# Patient Record
Sex: Male | Born: 1935
Health system: Southern US, Community
[De-identification: ages and names within clinical notes are randomized; demographics above are authoritative.]

## PROBLEM LIST (undated history)

## (undated) DIAGNOSIS — I639 Cerebral infarction, unspecified: Secondary | ICD-10-CM

## (undated) DIAGNOSIS — E7800 Pure hypercholesterolemia, unspecified: Secondary | ICD-10-CM

## (undated) DIAGNOSIS — R05 Cough: Secondary | ICD-10-CM

## (undated) DIAGNOSIS — M545 Low back pain, unspecified: Secondary | ICD-10-CM

## (undated) DIAGNOSIS — M7020 Olecranon bursitis, unspecified elbow: Secondary | ICD-10-CM

## (undated) DIAGNOSIS — N179 Acute kidney failure, unspecified: Secondary | ICD-10-CM

## (undated) DIAGNOSIS — I726 Aneurysm of vertebral artery: Secondary | ICD-10-CM

## (undated) DIAGNOSIS — R059 Cough, unspecified: Secondary | ICD-10-CM

## (undated) DIAGNOSIS — I6381 Other cerebral infarction due to occlusion or stenosis of small artery: Secondary | ICD-10-CM

## (undated) DIAGNOSIS — I10 Essential (primary) hypertension: Secondary | ICD-10-CM

## (undated) DIAGNOSIS — I4891 Unspecified atrial fibrillation: Secondary | ICD-10-CM

## (undated) DIAGNOSIS — Z8619 Personal history of other infectious and parasitic diseases: Secondary | ICD-10-CM

## (undated) DIAGNOSIS — M75100 Unspecified rotator cuff tear or rupture of unspecified shoulder, not specified as traumatic: Secondary | ICD-10-CM

## (undated) DIAGNOSIS — I700 Atherosclerosis of aorta: Secondary | ICD-10-CM

## (undated) DIAGNOSIS — L570 Actinic keratosis: Secondary | ICD-10-CM

## (undated) HISTORY — DX: Atherosclerosis of aorta: I70.0

## (undated) HISTORY — DX: Pure hypercholesterolemia, unspecified: E78.00

## (undated) HISTORY — DX: Unspecified rotator cuff tear or rupture of unspecified shoulder, not specified as traumatic: M75.100

## (undated) HISTORY — PX: TONSILLECTOMY AND ADENOIDECTOMY: SUR1326

## (undated) HISTORY — DX: Low back pain: M54.5

## (undated) HISTORY — DX: Actinic keratosis: L57.0

## (undated) HISTORY — DX: Low back pain, unspecified: M54.50

## (undated) HISTORY — DX: Olecranon bursitis, unspecified elbow: M70.20

## (undated) HISTORY — DX: Other cerebral infarction due to occlusion or stenosis of small artery: I63.81

## (undated) HISTORY — DX: Personal history of other infectious and parasitic diseases: Z86.19

## (undated) HISTORY — DX: Essential (primary) hypertension: I10

## (undated) HISTORY — DX: Unspecified atrial fibrillation: I48.91

## (undated) HISTORY — DX: Cough, unspecified: R05.9

## (undated) HISTORY — DX: Cerebral infarction, unspecified: I63.9

## (undated) HISTORY — DX: Cough: R05

## (undated) HISTORY — DX: Aneurysm of vertebral artery: I72.6

---

## 2000-01-24 ENCOUNTER — Ambulatory Visit (HOSPITAL_BASED_OUTPATIENT_CLINIC_OR_DEPARTMENT_OTHER): Admission: RE | Admit: 2000-01-24 | Discharge: 2000-01-24 | Payer: Self-pay | Admitting: Orthopedic Surgery

## 2003-06-02 ENCOUNTER — Encounter: Admission: RE | Admit: 2003-06-02 | Discharge: 2003-06-02 | Payer: Self-pay | Admitting: Internal Medicine

## 2003-06-12 ENCOUNTER — Encounter: Admission: RE | Admit: 2003-06-12 | Discharge: 2003-06-12 | Payer: Self-pay | Admitting: Internal Medicine

## 2003-07-14 ENCOUNTER — Ambulatory Visit (HOSPITAL_COMMUNITY): Admission: RE | Admit: 2003-07-14 | Discharge: 2003-07-14 | Payer: Self-pay | Admitting: Gastroenterology

## 2007-03-13 ENCOUNTER — Encounter: Admission: RE | Admit: 2007-03-13 | Discharge: 2007-03-13 | Payer: Self-pay | Admitting: Internal Medicine

## 2007-03-16 ENCOUNTER — Encounter: Admission: RE | Admit: 2007-03-16 | Discharge: 2007-03-16 | Payer: Self-pay | Admitting: Internal Medicine

## 2007-05-18 ENCOUNTER — Encounter: Admission: RE | Admit: 2007-05-18 | Discharge: 2007-05-18 | Payer: Self-pay | Admitting: Neurological Surgery

## 2008-05-17 ENCOUNTER — Encounter: Admission: RE | Admit: 2008-05-17 | Discharge: 2008-05-17 | Payer: Self-pay | Admitting: Neurological Surgery

## 2009-01-05 ENCOUNTER — Ambulatory Visit (HOSPITAL_COMMUNITY): Admission: RE | Admit: 2009-01-05 | Discharge: 2009-01-05 | Payer: Self-pay | Admitting: Orthopedic Surgery

## 2010-05-30 ENCOUNTER — Encounter
Admission: RE | Admit: 2010-05-30 | Discharge: 2010-05-30 | Payer: Self-pay | Source: Home / Self Care | Attending: Neurological Surgery | Admitting: Neurological Surgery

## 2010-08-09 LAB — DIFFERENTIAL
Basophils Absolute: 0 10*3/uL (ref 0.0–0.1)
Basophils Relative: 0 % (ref 0–1)
Eosinophils Absolute: 0.2 10*3/uL (ref 0.0–0.7)
Eosinophils Relative: 3 % (ref 0–5)
Lymphocytes Relative: 33 % (ref 12–46)
Lymphs Abs: 2 10*3/uL (ref 0.7–4.0)
Monocytes Absolute: 1 10*3/uL (ref 0.1–1.0)
Monocytes Relative: 16 % — ABNORMAL HIGH (ref 3–12)
Neutro Abs: 3 10*3/uL (ref 1.7–7.7)
Neutrophils Relative %: 48 % (ref 43–77)

## 2010-08-09 LAB — CBC
HCT: 43.6 % (ref 39.0–52.0)
Hemoglobin: 14.9 g/dL (ref 13.0–17.0)
MCHC: 34.2 g/dL (ref 30.0–36.0)
MCV: 88.2 fL (ref 78.0–100.0)
Platelets: 155 10*3/uL (ref 150–400)
RBC: 4.95 MIL/uL (ref 4.22–5.81)
RDW: 14.1 % (ref 11.5–15.5)
WBC: 6.2 10*3/uL (ref 4.0–10.5)

## 2010-08-09 LAB — BASIC METABOLIC PANEL
BUN: 16 mg/dL (ref 6–23)
CO2: 26 mEq/L (ref 19–32)
Calcium: 9.6 mg/dL (ref 8.4–10.5)
Chloride: 104 mEq/L (ref 96–112)
Creatinine, Ser: 0.95 mg/dL (ref 0.4–1.5)
GFR calc Af Amer: >60 mL/min (ref >60)
GFR calc non Af Amer: >60 mL/min (ref >60)
Glucose, Bld: 116 mg/dL — ABNORMAL HIGH (ref 70–99)
Potassium: 4.5 mEq/L (ref 3.5–5.1)
Sodium: 138 mEq/L (ref 135–145)

## 2010-08-09 LAB — PROTIME-INR
INR: 1 (ref 0.00–1.49)
Prothrombin Time: 12.6 seconds (ref 11.6–15.2)

## 2010-08-09 LAB — URINALYSIS, ROUTINE W REFLEX MICROSCOPIC
Bilirubin Urine: NEGATIVE
Glucose, UA: NEGATIVE mg/dL
Hgb urine dipstick: NEGATIVE
Ketones, ur: NEGATIVE mg/dL
Nitrite: NEGATIVE
Protein, ur: NEGATIVE mg/dL
Specific Gravity, Urine: 1.012 (ref 1.005–1.030)
Urobilinogen, UA: 0.2 mg/dL (ref 0.0–1.0)
pH: 6.5 (ref 5.0–8.0)

## 2010-08-09 LAB — APTT: aPTT: 31 seconds (ref 24–37)

## 2010-09-20 NOTE — Op Note (Signed)
[facility]  Patient:    Steven Meadows, Steven Meadows                       MRN: [id number] Adm. Date:  [id number] Disc. Date: [id number] Attending:  Alinda Deem                           Operative Report  PREOPERATIVE DIAGNOSIS:  Left knee medial meniscal tear.  POSTOPERATIVE DIAGNOSIS:  Left knee medial meniscal tear along with medial femoral condyle chondromalacia grade 3, global.  OPERATION PERFORMED:  Left knee arthroscopic partial medial menisectomy and debridement of chondromalacia from the medial femoral condyle by grade 3 global.  SURGEON:  Alinda Deem, M.D.  ASSISTANT:  None.  ANESTHESIA:  General LMA.  ESTIMATED BLOOD LOSS:  Minimal.  FLUID REPLACEMENT:  1200 cc crystalloid.  DRAINS:  None.  TOURNIQUET TIME:  None.  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  Now plain radiographs are consistent with mild medial compartment DJD.  He is quite tender along the medial jointline and now desires arthroscopic evaluation and treatment of a medial meniscal tear.  DESCRIPTION OF PROCEDURE:  The patient was identified by arm band and taken to the operating room at [facility] where the appropriate anesthetic monitors were attached and general LMA anesthesia induced with the patient in the supine position.  A lateral post was then applied to the table and the left lower extremity prepped and draped in the usual sterile fashion from the ankle to the midthigh.  The inferomedial and inferolateral peripatellar portal regions were then infiltrated with  2 to 3 cc of 0.5% Marcaine and epinephrine solution and another 10 cc ws placed into the knee joint itself.  Using a #11 blade, standard inferomedial and inferolateral peripatellar portals were then made allowing introduction of the arthroscope  through the inferolateral portal and the outflow through the inferomedial portal.  Diagnostic arthroscopy was then undertaken.  The first finding was 2+ joint effusion with normal-appearing joint fluid and bits of articular cartilage noted to be floating around in the joint fluid as it was expressed out of the knee.  The suprapatellar pouch, patella and trochlea revealed grade 2 chondromalacia that required light  debridement.  Moving into the medial compartment, grade 3 chondromalacia of the medial femoral condyle, almost global was noted and this was debrided back to stable margins as well as continous outflow removing small bits of articular cartilage that were floating in the joint fluid. There is a complex degenerative tear of the posterior horn of the medial meniscus which was debrided with a 3.5 mm gator sucker shaver from [place] back to a stable margin, which was then thoroughly probed.  The ACL and the PCL were then probed and found to be intact.  The lateral compartment had minimal degenerative tearing of the lateral meniscus and this was very lightly debrided incidentally.  The medial and lateral gutters were cleared.  The arthroscope was taken medial and lateral to the PCL clearing the posterior compartments as well.  At this point the knee was washed out with normal saline solution, the arthroscopic instruments removed.  A dressing of Xeroform, 4 x 8 dressing sponges, Webril and an Ace wrap applied.  The patient was awakened and taken to the recovery room without  difficulty. DD:  01/24/00 TD:  01/26/00 Job: [id number]

## 2011-05-06 HISTORY — PX: OTHER SURGICAL HISTORY: SHX169

## 2011-06-16 ENCOUNTER — Encounter: Payer: Self-pay | Admitting: Emergency Medicine

## 2011-06-17 ENCOUNTER — Ambulatory Visit (INDEPENDENT_AMBULATORY_CARE_PROVIDER_SITE_OTHER): Payer: BC Managed Care – PPO | Admitting: Emergency Medicine

## 2011-06-17 ENCOUNTER — Encounter: Payer: Self-pay | Admitting: Emergency Medicine

## 2011-06-17 ENCOUNTER — Other Ambulatory Visit: Payer: Self-pay | Admitting: Emergency Medicine

## 2011-06-17 DIAGNOSIS — R05 Cough: Secondary | ICD-10-CM | POA: Insufficient documentation

## 2011-06-17 DIAGNOSIS — R059 Cough, unspecified: Secondary | ICD-10-CM

## 2011-06-17 DIAGNOSIS — E785 Hyperlipidemia, unspecified: Secondary | ICD-10-CM | POA: Insufficient documentation

## 2011-06-17 DIAGNOSIS — I6350 Cerebral infarction due to unspecified occlusion or stenosis of unspecified cerebral artery: Secondary | ICD-10-CM

## 2011-06-17 DIAGNOSIS — I10 Essential (primary) hypertension: Secondary | ICD-10-CM

## 2011-06-17 DIAGNOSIS — I639 Cerebral infarction, unspecified: Secondary | ICD-10-CM | POA: Insufficient documentation

## 2011-06-17 MED ORDER — LORATADINE 10 MG PO TABS: 10.0000 mg | ORAL_TABLET | Freq: Every day | ORAL | Status: DC

## 2011-06-17 MED ORDER — OMEPRAZOLE 20 MG PO CPDR: DELAYED_RELEASE_CAPSULE | ORAL | Status: DC

## 2011-06-17 NOTE — Patient Instructions (Signed)
Please stop your lisinopril altogether Restart valsartan 320mg  once daily Continue your nasal saline washes every day Restart loratadine (Claritin) 10mg  daily until your next visit Start omeprazole (Prilosec) 20mg  twice a day for the next 2 weeks, then go to to 20mg  daily until your next visit Follow with Dr Delton Coombes in 3 -4 weeks

## 2011-06-17 NOTE — Telephone Encounter (Signed)
Diovan is not on the pt's medication list and any request for this medication should go through pt's PCP, Dr. Theressa Millard.

## 2011-06-17 NOTE — Assessment & Plan Note (Signed)
Cough likely multifactorial, strongest factors being PND and nasal congestion Plan: Please stop your lisinopril altogether Restart valsartan 320mg  once daily Continue your nasal saline washes every day Restart loratadine (Claritin) 10mg  daily until your next visit Start omeprazole (Prilosec) 20mg  twice a day for the next 2 weeks, then go to to 20mg  daily until your next visit Follow with Dr Delton Coombes in 3 -4 weeks

## 2011-06-17 NOTE — Progress Notes (Signed)
Subjective:    Patient ID: Steven Meadows, male    DOB: 05/02/1932, 78 y.o.   MRN: [id number]  HPI 78 yo never smoker, presents in referral from Dr Particia Lather c/o cough. He was well until October 2012 when he got URI sx including cough. The URI got better but he maintained a dry cough, kept a tickle in his throat. He feels mucous drain down from head especially at night - will wake him from sleep. Also bothered by talking a lot. Treated twice with azithromycin. He was told to stop lisinopril for an ARB, but he actually only changed from bid to qd + ARB qd. Occasional ? GERD sx. Con    Review of Systems  Constitutional: Negative.  Negative for fever and unexpected weight change.  HENT: Positive for congestion. Negative for ear pain, nosebleeds, sore throat, rhinorrhea, sneezing, trouble swallowing, dental problem, postnasal drip and sinus pressure.   Eyes: Negative.  Negative for redness and itching.  Respiratory: Positive for cough. Negative for chest tightness, shortness of breath and wheezing.   Cardiovascular: Negative.  Negative for palpitations and leg swelling.  Gastrointestinal: Negative.  Negative for nausea and vomiting.  Genitourinary: Negative.  Negative for dysuria.  Musculoskeletal: Negative.  Negative for joint swelling.  Skin: Negative.  Negative for rash.  Neurological: Negative.  Negative for headaches.  Hematological: Negative.  Does not bruise/bleed easily.  Psychiatric/Behavioral: Negative.  Negative for dysphoric mood. The patient is not nervous/anxious.    Past Medical History  Diagnosis Date  . Cough   . Lacunar stroke   . HTN (hypertension)   . Hypercholesteremia   . Aneurysm of vertebral artery   . Low back pain   . Rotator cuff tear   . History of mononucleosis   . Abdominal aortic atherosclerosis   . Actinic keratoses   . Olecranon bursitis      Family History  Problem Relation Age of Onset  . Heart attack Paternal Grandfather   . Heart attack Maternal  Grandfather   . Cancer Paternal Grandmother      History   Social History  . Marital Status: Married    Spouse Name: N/A    Number of Children: N/A  . Years of Education: N/A   Occupational History  . Not on file.   Social History Main Topics  . Smoking status: Never Smoker   . Smokeless tobacco: Not on file  . Alcohol Use: Yes     2 drinks per night  . Drug Use: No  . Sexually Active: Not on file   Other Topics Concern  . Not on file   Social History Narrative  . No narrative on file     Allergies  Allergen Reactions  . Nabumetone     Gi upset     Outpatient Prescriptions Prior to Visit  Medication Sig Dispense Refill  . aspirin 325 MG EC tablet Take 325 mg by mouth 2 (two) times daily.      Marland Kitchen atorvastatin (LIPITOR) 40 MG tablet Take 40 mg by mouth daily.      . chlorthalidone (HYGROTON) 25 MG tablet Take 25 mg by mouth daily.      Marland Kitchen diltiazem (DILACOR XR) 240 MG 24 hr capsule Take 240 mg by mouth daily.      Marland Kitchen ezetimibe (ZETIA) 10 MG tablet Take 10 mg by mouth daily.      Marland Kitchen HYDROcodone-acetaminophen (VICODIN) 5-500 MG per tablet Take 1 tablet by mouth every 6 (six) hours as needed.      Marland Kitchen  lisinopril (PRINIVIL,ZESTRIL) 40 MG tablet Take 40 mg by mouth daily.      Marland Kitchen loratadine (CLARITIN) 10 MG tablet Take 10 mg by mouth daily.      . sildenafil (VIAGRA) 50 MG tablet Take 50 mg by mouth daily as needed.             Objective:   Physical Exam  Gen: Pleasant, well-nourished, in no distress,  normal affect  ENT: No lesions,  mouth clear,  oropharynx clear, no postnasal drip  Neck: No JVD, no TMG, no carotid bruits  Lungs: No use of accessory muscles, no dullness to percussion, clear without rales or rhonchi  Cardiovascular: RRR, heart sounds normal, no murmur or gallops, no peripheral edema  Musculoskeletal: No deformities, no cyanosis or clubbing  Neuro: alert, non focal  Skin: Warm, no lesions or rashes     Assessment & Plan:  Cough Cough likely  multifactorial, strongest factors being PND and nasal congestion Plan: Please stop your lisinopril altogether Restart valsartan 320mg  once daily Continue your nasal saline washes every day Restart loratadine (Claritin) 10mg  daily until your next visit Start omeprazole (Prilosec) 20mg  twice a day for the next 2 weeks, then go to to 20mg  daily until your next visit Follow with Dr Delton Coombes in 3 -4 weeks

## 2011-07-08 ENCOUNTER — Encounter: Payer: Self-pay | Admitting: Emergency Medicine

## 2011-07-08 ENCOUNTER — Ambulatory Visit (INDEPENDENT_AMBULATORY_CARE_PROVIDER_SITE_OTHER): Payer: BC Managed Care – PPO | Admitting: Emergency Medicine

## 2011-07-08 VITALS — BP 112/70 | HR 56 | Temp 97.7°F | Ht 72.0 in | Wt 219.8 lb

## 2011-07-08 DIAGNOSIS — R059 Cough, unspecified: Secondary | ICD-10-CM

## 2011-07-08 DIAGNOSIS — R05 Cough: Secondary | ICD-10-CM

## 2011-07-08 NOTE — Patient Instructions (Signed)
Please continue your loratadine and valsartan as you are taking them Use your nasal saline every day Stop the omeprazole. If you cough more off this medication we can consider restarting Try taking Qnasl 2 sprays each nostril daily to see if this helps your nose congestion.  Follow with Dr Particia Lather as planned and with Dr Delton Coombes if needed

## 2011-07-08 NOTE — Assessment & Plan Note (Signed)
Cough and PND much better although still present - continue NSW and lotatadine - add nasal steroid to see if he benefits; if so then will call in script - stop omeprazole to see if he tolerates - rov prn/

## 2011-07-08 NOTE — Progress Notes (Signed)
  Subjective:    Patient ID: Steven Meadows, male    DOB: Nov 28, 1931, 78 y.o.   MRN: [id number]  HPI 78 yo never smoker, presents in referral from Dr Particia Lather c/o cough. He was well until October 2012 when he got URI sx including cough. The URI got better but he maintained a dry cough, kept a tickle in his throat. He feels mucous drain down from head especially at night - will wake him from sleep. Also bothered by talking a lot. Treated twice with azithromycin. He was told to stop lisinopril for an ARB, but he actually only changed from bid to qd + ARB qd. Occasional ? GERD sx. Con  ROV 07/08/11 -- return visit for cough that started in 10/12. Last time we started rx for GERD, loratadine, changed ACE-i to ARB, valsartan 320mg . The cough is much better. Still with clear PND. Started [place]. Still on omeprazole qd.      Objective:   Physical Exam  Gen: Pleasant, well-nourished, in no distress,  normal affect  ENT: No lesions,  mouth clear,  oropharynx clear, no postnasal drip  Neck: No JVD, no TMG, no carotid bruits  Lungs: No use of accessory muscles, no dullness to percussion, clear without rales or rhonchi  Cardiovascular: RRR, heart sounds normal, no murmur or gallops, no peripheral edema  Musculoskeletal: No deformities, no cyanosis or clubbing  Neuro: alert, non focal  Skin: Warm, no lesions or rashes     Assessment & Plan:  Cough Cough and PND much better although still present - continue NSW and lotatadine - add nasal steroid to see if he benefits; if so then will call in script - stop omeprazole to see if he tolerates - rov prn/

## 2011-12-08 ENCOUNTER — Other Ambulatory Visit: Payer: Self-pay | Admitting: Emergency Medicine

## 2012-03-06 ENCOUNTER — Other Ambulatory Visit: Payer: Self-pay | Admitting: Emergency Medicine

## 2012-07-08 ENCOUNTER — Other Ambulatory Visit: Payer: Self-pay | Admitting: Emergency Medicine

## 2012-08-09 ENCOUNTER — Other Ambulatory Visit: Payer: Self-pay | Admitting: Emergency Medicine

## 2012-12-03 ENCOUNTER — Other Ambulatory Visit: Payer: Self-pay | Admitting: Emergency Medicine

## 2013-04-08 ENCOUNTER — Other Ambulatory Visit: Payer: Self-pay | Admitting: Emergency Medicine

## 2013-08-04 ENCOUNTER — Other Ambulatory Visit: Payer: Self-pay | Admitting: Emergency Medicine

## 2013-11-07 ENCOUNTER — Other Ambulatory Visit: Payer: Self-pay | Admitting: Neurological Surgery

## 2013-11-07 DIAGNOSIS — I671 Cerebral aneurysm, nonruptured: Secondary | ICD-10-CM

## 2013-11-09 ENCOUNTER — Other Ambulatory Visit: Payer: Self-pay | Admitting: Neurological Surgery

## 2013-11-09 LAB — BUN: BUN: 18 mg/dL (ref 6–23)

## 2013-11-09 LAB — CREATININE, SERUM: Creat: 1 mg/dL (ref 0.50–1.35)

## 2013-11-22 ENCOUNTER — Ambulatory Visit
Admission: RE | Admit: 2013-11-22 | Discharge: 2013-11-22 | Disposition: A | Discharge status: Home / Self Care | Payer: BC Managed Care – PPO | Source: Ambulatory Visit | Attending: Neurological Surgery | Admitting: Neurological Surgery

## 2013-11-22 DIAGNOSIS — I671 Cerebral aneurysm, nonruptured: Secondary | ICD-10-CM

## 2013-11-22 MED ORDER — IOHEXOL 350 MG/ML SOLN
75.0000 mL | Freq: Once | INTRAVENOUS | Status: AC | PRN
  Administered 2013-11-22: 75 mL via INTRAVENOUS

## 2014-01-10 ENCOUNTER — Emergency Department (HOSPITAL_COMMUNITY)
Admission: EM | Admit: 2014-01-10 | Discharge: 2014-01-10 | Disposition: A | Discharge status: Home / Self Care | Payer: BC Managed Care – PPO | Attending: Emergency Medicine | Admitting: Emergency Medicine

## 2014-01-10 ENCOUNTER — Encounter (HOSPITAL_COMMUNITY): Payer: Self-pay | Admitting: Emergency Medicine

## 2014-01-10 DIAGNOSIS — E7800 Pure hypercholesterolemia, unspecified: Secondary | ICD-10-CM | POA: Insufficient documentation

## 2014-01-10 DIAGNOSIS — IMO0002 Reserved for concepts with insufficient information to code with codable children: Secondary | ICD-10-CM | POA: Insufficient documentation

## 2014-01-10 DIAGNOSIS — Z8619 Personal history of other infectious and parasitic diseases: Secondary | ICD-10-CM | POA: Insufficient documentation

## 2014-01-10 DIAGNOSIS — Y9389 Activity, other specified: Secondary | ICD-10-CM | POA: Insufficient documentation

## 2014-01-10 DIAGNOSIS — I10 Essential (primary) hypertension: Secondary | ICD-10-CM | POA: Diagnosis not present

## 2014-01-10 DIAGNOSIS — Y9289 Other specified places as the place of occurrence of the external cause: Secondary | ICD-10-CM | POA: Diagnosis not present

## 2014-01-10 DIAGNOSIS — Z872 Personal history of diseases of the skin and subcutaneous tissue: Secondary | ICD-10-CM | POA: Diagnosis not present

## 2014-01-10 DIAGNOSIS — T63441A Toxic effect of venom of bees, accidental (unintentional), initial encounter: Secondary | ICD-10-CM

## 2014-01-10 DIAGNOSIS — Z8673 Personal history of transient ischemic attack (TIA), and cerebral infarction without residual deficits: Secondary | ICD-10-CM | POA: Diagnosis not present

## 2014-01-10 DIAGNOSIS — T63461A Toxic effect of venom of wasps, accidental (unintentional), initial encounter: Secondary | ICD-10-CM | POA: Insufficient documentation

## 2014-01-10 DIAGNOSIS — Z79899 Other long term (current) drug therapy: Secondary | ICD-10-CM | POA: Diagnosis not present

## 2014-01-10 DIAGNOSIS — Z87828 Personal history of other (healed) physical injury and trauma: Secondary | ICD-10-CM | POA: Insufficient documentation

## 2014-01-10 DIAGNOSIS — Z7982 Long term (current) use of aspirin: Secondary | ICD-10-CM | POA: Diagnosis not present

## 2014-01-10 DIAGNOSIS — T6391XA Toxic effect of contact with unspecified venomous animal, accidental (unintentional), initial encounter: Secondary | ICD-10-CM | POA: Diagnosis not present

## 2014-01-10 DIAGNOSIS — Z8739 Personal history of other diseases of the musculoskeletal system and connective tissue: Secondary | ICD-10-CM | POA: Insufficient documentation

## 2014-01-10 MED ORDER — OXYCODONE-ACETAMINOPHEN 5-325 MG PO TABS: 1.0000 | ORAL_TABLET | Freq: Four times a day (QID) | ORAL | Status: DC | PRN

## 2014-01-10 MED ORDER — DIPHENHYDRAMINE HCL 50 MG/ML IJ SOLN
25.0000 mg | Freq: Once | INTRAMUSCULAR | Status: AC
  Administered 2014-01-10: 25 mg via INTRAVENOUS
  Filled 2014-01-10: qty 1

## 2014-01-10 MED ORDER — ONDANSETRON HCL 4 MG/2ML IJ SOLN
4.0000 mg | Freq: Once | INTRAMUSCULAR | Status: AC
  Administered 2014-01-10: 4 mg via INTRAVENOUS
  Filled 2014-01-10: qty 2

## 2014-01-10 MED ORDER — SODIUM CHLORIDE 0.9 % IV SOLN
Freq: Once | INTRAVENOUS | Status: AC
  Administered 2014-01-10: 06:00:00 via INTRAVENOUS

## 2014-01-10 MED ORDER — DIPHENHYDRAMINE HCL 25 MG PO TABS: 50.0000 mg | ORAL_TABLET | Freq: Four times a day (QID) | ORAL | Status: DC

## 2014-01-10 MED ORDER — OXYCODONE-ACETAMINOPHEN 5-325 MG PO TABS
1.0000 | ORAL_TABLET | Freq: Once | ORAL | Status: AC
  Administered 2014-01-10: 1 via ORAL
  Filled 2014-01-10: qty 1

## 2014-01-10 MED ORDER — PREDNISONE 10 MG PO TABS: 60.0000 mg | ORAL_TABLET | Freq: Every day | ORAL | Status: DC

## 2014-01-10 MED ORDER — METHYLPREDNISOLONE SODIUM SUCC 125 MG IJ SOLR
125.0000 mg | Freq: Once | INTRAMUSCULAR | Status: AC
  Administered 2014-01-10: 125 mg via INTRAVENOUS
  Filled 2014-01-10: qty 2

## 2014-01-10 MED ORDER — FAMOTIDINE IN NACL 20-0.9 MG/50ML-% IV SOLN
20.0000 mg | Freq: Once | INTRAVENOUS | Status: AC
  Administered 2014-01-10: 20 mg via INTRAVENOUS
  Filled 2014-01-10: qty 50

## 2014-01-10 MED ORDER — MORPHINE SULFATE 4 MG/ML IJ SOLN
4.0000 mg | Freq: Once | INTRAMUSCULAR | Status: AC
  Administered 2014-01-10: 4 mg via INTRAVENOUS
  Filled 2014-01-10: qty 1

## 2014-01-10 NOTE — ED Notes (Addendum)
Pt resting with wife at bedside.

## 2014-01-10 NOTE — ED Notes (Signed)
Pt. reported that he was  stung by multiple " yellow jackets ' this evening at his scalp , right hand and both legs , airway intact / respirations unlaboerd.

## 2014-01-10 NOTE — Discharge Instructions (Signed)
Return to the ED with any concerns including difficulty breathing, lip or tongue swelling, fainting, vomiting, decreased level of alertness/lethargy, or any other alarming symptoms

## 2014-01-10 NOTE — ED Provider Notes (Signed)
CSN: [id number]     Arrival date & time 01/10/14  0125 History   First MD Initiated Contact with Patient 01/10/14 [phone]     Chief Complaint  Patient presents with  . Insect Bite     (Consider location/radiation/quality/duration/timing/severity/associated sxs/prior Treatment) HPI Pt presenting after approx 20-30 yellow jacket stings.  Pt states he was stung by multiple bees approx 6pm tonight.  Stings in his scalp, hands, lower legs.  No difficulty breathing.  No lip or tongue swelling.  No fainting. He took 2 doses of benadryl- 25mg  each.  Last dose approx 10pm tonight.  He also took a hydrocodone that he had at home for pain.  Pain is throbbing in nature and constant.  There are no other associated systemic symptoms, there are no other alleviating or modifying factors.   Past Medical History  Diagnosis Date  . Cough   . Lacunar stroke   . HTN (hypertension)   . Hypercholesteremia   . Aneurysm of vertebral artery   . Low back pain   . Rotator cuff tear   . History of mononucleosis   . Abdominal aortic atherosclerosis   . Actinic keratoses   . Olecranon bursitis    Past Surgical History  Procedure Laterality Date  . Tonsillectomy and adenoidectomy    . Cataract extraction--right eye  2013   Family History  Problem Relation Age of Onset  . Heart attack Paternal Grandfather   . Heart attack Maternal Grandfather   . Cancer Paternal Grandmother    History  Substance Use Topics  . Smoking status: Never Smoker   . Smokeless tobacco: Not on file  . Alcohol Use: Yes     Comment: 2 drinks per night    Review of Systems ROS reviewed and all otherwise negative except for mentioned in HPI    Allergies  Nabumetone  Home Medications   Prior to Admission medications   Medication Sig Start Date End Date Taking? Authorizing Provider  aspirin 325 MG EC tablet Take 325 mg by mouth 2 (two) times daily.   Yes Historical Provider, MD  atorvastatin (LIPITOR) 40 MG tablet Take 40 mg  by mouth daily.   Yes Historical Provider, MD  chlorthalidone (HYGROTON) 25 MG tablet Take 25 mg by mouth daily.   Yes Historical Provider, MD  diltiazem (DILACOR XR) 120 MG 24 hr capsule Take 120 mg by mouth daily.   Yes Historical Provider, MD  diphenhydrAMINE (BENADRYL) 25 mg capsule Take 25 mg by mouth every 6 (six) hours as needed for allergies.   Yes Historical Provider, MD  ezetimibe (ZETIA) 10 MG tablet Take 10 mg by mouth daily.   Yes Historical Provider, MD  loratadine (CLARITIN) 10 MG tablet Take 10 mg by mouth daily.   Yes Historical Provider, MD  naproxen sodium (ANAPROX) 220 MG tablet Take 440 mg by mouth daily as needed (pain).   Yes Historical Provider, MD  omeprazole (PRILOSEC) 20 MG capsule Take 20 mg by mouth daily. 06/17/11  Yes Collene Gobble, MD  valsartan (DIOVAN) 320 MG tablet Take 320 mg by mouth daily.   Yes Historical Provider, MD  diphenhydrAMINE (BENADRYL) 25 MG tablet Take 2 tablets (50 mg total) by mouth every 6 (six) hours. Take 1-2 tablets every 6 hours x 2 days, then space out to an as needed basis 01/10/14   Threasa Beards, MD  oxyCODONE-acetaminophen (PERCOCET/ROXICET) 5-325 MG per tablet Take 1-2 tablets by mouth every 6 (six) hours as needed for severe pain. 01/10/14  Threasa Beards, MD  predniSONE (DELTASONE) 10 MG tablet Take 6 tablets (60 mg total) by mouth daily. Take 6, 5, 4, 3, 2, 1 tabs po qD x 3 days each 01/10/14   Threasa Beards, MD   BP 138/85  Pulse 64  Temp(Src) 98.5 F (36.9 C) (Oral)  Resp 16  Ht 5\' 11"  (1.803 m)  Wt 223 lb (101.152 kg)  BMI 31.12 kg/m2  SpO2 95% Vitals reviewed Physical Exam Physical Examination: General appearance - alert, well appearing, and in no distress Mental status - alert, oriented to person, place, and time Eyes - no conjunctival injection, no scleral icterus Mouth - mucous membranes moist, pharynx normal without lesions, no lip or tongue swelling Chest - clear to auscultation, no wheezes, rales or rhonchi,  symmetric air entry Heart - normal rate, regular rhythm, normal S1, S2, no murmurs, rubs, clicks or gallops Abdomen - soft, nontender, nondistended, no masses or organomegaly Extremities - peripheral pulses normal, no pedal edema, no clubbing or cyanosis Skin - normal skin turgor, scattered area of erythematous papules c/w bee stings over posterior scalp, fingers, thighs  ED Course  Procedures (including critical care time) Labs Review Labs Reviewed - No data to display  Imaging Review No results found.   EKG Interpretation None      MDM   Final diagnoses:  Bee sting reaction, accidental or unintentional, initial encounter    Pt stung by multiple bees- he has areas of localized reaction scattered.  No signs of anaphylaxis.  Pt given IV benadryl, steroids, pepcid.  Will be discharged to take scheduled benadryl, steroid taper, also given pain medication for discomfort.  Discharged with strict return precautions.  Pt agreeable with plan.    Threasa Beards, MD 01/10/14 [phone]

## 2014-04-11 ENCOUNTER — Other Ambulatory Visit: Payer: Self-pay | Admitting: Emergency Medicine

## 2014-05-15 ENCOUNTER — Other Ambulatory Visit: Payer: Self-pay | Admitting: Emergency Medicine

## 2014-12-20 ENCOUNTER — Encounter (HOSPITAL_COMMUNITY): Payer: Self-pay | Admitting: Radiology

## 2014-12-20 ENCOUNTER — Emergency Department (HOSPITAL_COMMUNITY): Payer: BLUE CROSS/BLUE SHIELD

## 2014-12-20 ENCOUNTER — Inpatient Hospital Stay (HOSPITAL_COMMUNITY)
Admission: EM | Admit: 2014-12-20 | Discharge: 2014-12-23 | DRG: 066 | Disposition: A | Discharge status: Home / Self Care | Payer: BLUE CROSS/BLUE SHIELD | Attending: Internal Medicine | Admitting: Internal Medicine

## 2014-12-20 DIAGNOSIS — R479 Unspecified speech disturbances: Secondary | ICD-10-CM

## 2014-12-20 DIAGNOSIS — I638 Other cerebral infarction: Secondary | ICD-10-CM | POA: Diagnosis not present

## 2014-12-20 DIAGNOSIS — I6789 Other cerebrovascular disease: Secondary | ICD-10-CM | POA: Diagnosis not present

## 2014-12-20 DIAGNOSIS — Z13828 Encounter for screening for other musculoskeletal disorder: Secondary | ICD-10-CM | POA: Diagnosis not present

## 2014-12-20 DIAGNOSIS — Z8249 Family history of ischemic heart disease and other diseases of the circulatory system: Secondary | ICD-10-CM

## 2014-12-20 DIAGNOSIS — R931 Abnormal findings on diagnostic imaging of heart and coronary circulation: Secondary | ICD-10-CM | POA: Diagnosis not present

## 2014-12-20 DIAGNOSIS — I639 Cerebral infarction, unspecified: Secondary | ICD-10-CM | POA: Diagnosis not present

## 2014-12-20 DIAGNOSIS — I6340 Cerebral infarction due to embolism of unspecified cerebral artery: Principal | ICD-10-CM | POA: Diagnosis present

## 2014-12-20 DIAGNOSIS — G459 Transient cerebral ischemic attack, unspecified: Secondary | ICD-10-CM | POA: Diagnosis not present

## 2014-12-20 DIAGNOSIS — Z886 Allergy status to analgesic agent status: Secondary | ICD-10-CM

## 2014-12-20 DIAGNOSIS — I482 Chronic atrial fibrillation, unspecified: Secondary | ICD-10-CM | POA: Diagnosis present

## 2014-12-20 DIAGNOSIS — Z79899 Other long term (current) drug therapy: Secondary | ICD-10-CM | POA: Diagnosis not present

## 2014-12-20 DIAGNOSIS — Z8673 Personal history of transient ischemic attack (TIA), and cerebral infarction without residual deficits: Secondary | ICD-10-CM | POA: Diagnosis present

## 2014-12-20 DIAGNOSIS — I726 Aneurysm of vertebral artery: Secondary | ICD-10-CM | POA: Diagnosis present

## 2014-12-20 DIAGNOSIS — Z7982 Long term (current) use of aspirin: Secondary | ICD-10-CM | POA: Diagnosis not present

## 2014-12-20 DIAGNOSIS — I6309 Cerebral infarction due to thrombosis of other precerebral artery: Secondary | ICD-10-CM | POA: Diagnosis not present

## 2014-12-20 DIAGNOSIS — I4891 Unspecified atrial fibrillation: Secondary | ICD-10-CM | POA: Diagnosis not present

## 2014-12-20 DIAGNOSIS — R4701 Aphasia: Secondary | ICD-10-CM | POA: Diagnosis not present

## 2014-12-20 DIAGNOSIS — Z9841 Cataract extraction status, right eye: Secondary | ICD-10-CM

## 2014-12-20 DIAGNOSIS — I6523 Occlusion and stenosis of bilateral carotid arteries: Secondary | ICD-10-CM | POA: Diagnosis present

## 2014-12-20 DIAGNOSIS — I720 Aneurysm of carotid artery: Secondary | ICD-10-CM | POA: Diagnosis not present

## 2014-12-20 DIAGNOSIS — E785 Hyperlipidemia, unspecified: Secondary | ICD-10-CM | POA: Diagnosis not present

## 2014-12-20 DIAGNOSIS — I10 Essential (primary) hypertension: Secondary | ICD-10-CM | POA: Diagnosis not present

## 2014-12-20 DIAGNOSIS — I728 Aneurysm of other specified arteries: Secondary | ICD-10-CM | POA: Diagnosis not present

## 2014-12-20 DIAGNOSIS — I63431 Cerebral infarction due to embolism of right posterior cerebral artery: Secondary | ICD-10-CM | POA: Diagnosis not present

## 2014-12-20 LAB — COMPREHENSIVE METABOLIC PANEL
ALT: 34 U/L (ref 17–63)
AST: 30 U/L (ref 15–41)
Albumin: 4.6 g/dL (ref 3.5–5.0)
Alkaline Phosphatase: 37 U/L — ABNORMAL LOW (ref 38–126)
Anion gap: 13 (ref 5–15)
BUN: 18 mg/dL (ref 6–20)
CO2: 25 mmol/L (ref 22–32)
Calcium: 9.8 mg/dL (ref 8.9–10.3)
Chloride: 100 mmol/L — ABNORMAL LOW (ref 101–111)
Creatinine, Ser: 1.2 mg/dL (ref 0.61–1.24)
GFR calc Af Amer: >60 mL/min (ref >60)
GFR calc non Af Amer: 55 mL/min — ABNORMAL LOW (ref >60)
Glucose, Bld: 99 mg/dL (ref 65–99)
Potassium: 3.5 mmol/L (ref 3.5–5.1)
Sodium: 138 mmol/L (ref 135–145)
Total Bilirubin: 0.8 mg/dL (ref 0.3–1.2)
Total Protein: 7.4 g/dL (ref 6.5–8.1)

## 2014-12-20 LAB — CBC
HCT: 48.2 % (ref 39.0–52.0)
Hemoglobin: 16.7 g/dL (ref 13.0–17.0)
MCH: 30 pg (ref 26.0–34.0)
MCHC: 34.6 g/dL (ref 30.0–36.0)
MCV: 86.7 fL (ref 78.0–100.0)
Platelets: 154 10*3/uL (ref 150–400)
RBC: 5.56 MIL/uL (ref 4.22–5.81)
RDW: 13.7 % (ref 11.5–15.5)
WBC: 9.1 10*3/uL (ref 4.0–10.5)

## 2014-12-20 LAB — DIFFERENTIAL
Basophils Absolute: 0 10*3/uL (ref 0.0–0.1)
Basophils Relative: 0 % (ref 0–1)
Eosinophils Absolute: 0.2 10*3/uL (ref 0.0–0.7)
Eosinophils Relative: 3 % (ref 0–5)
Lymphocytes Relative: 41 % (ref 12–46)
Lymphs Abs: 3.8 10*3/uL (ref 0.7–4.0)
Monocytes Absolute: 1.2 10*3/uL — ABNORMAL HIGH (ref 0.1–1.0)
Monocytes Relative: 14 % — ABNORMAL HIGH (ref 3–12)
Neutro Abs: 3.8 10*3/uL (ref 1.7–7.7)
Neutrophils Relative %: 42 % — ABNORMAL LOW (ref 43–77)

## 2014-12-20 LAB — I-STAT CHEM 8, ED
BUN: 23 mg/dL — ABNORMAL HIGH (ref 6–20)
Calcium, Ion: 1.11 mmol/L — ABNORMAL LOW (ref 1.13–1.30)
Chloride: 99 mmol/L — ABNORMAL LOW (ref 101–111)
Creatinine, Ser: 1.2 mg/dL (ref 0.61–1.24)
Glucose, Bld: 94 mg/dL (ref 65–99)
HCT: 54 % — ABNORMAL HIGH (ref 39.0–52.0)
Hemoglobin: 18.4 g/dL — ABNORMAL HIGH (ref 13.0–17.0)
Potassium: 3.5 mmol/L (ref 3.5–5.1)
Sodium: 138 mmol/L (ref 135–145)
TCO2: 25 mmol/L (ref 0–100)

## 2014-12-20 LAB — I-STAT TROPONIN, ED: Troponin i, poc: 0 ng/mL (ref 0.00–0.08)

## 2014-12-20 LAB — APTT: aPTT: 32 seconds (ref 24–37)

## 2014-12-20 LAB — PROTIME-INR
INR: 0.93 (ref 0.00–1.49)
Prothrombin Time: 12.6 seconds (ref 11.6–15.2)

## 2014-12-20 MED ORDER — DILTIAZEM HCL 100 MG IV SOLR
5.0000 mg/h | Freq: Once | INTRAVENOUS | Status: AC
  Administered 2014-12-21: 5 mg/h via INTRAVENOUS
  Filled 2014-12-20: qty 100

## 2014-12-20 NOTE — ED Notes (Signed)
Pt presents via POV to ambulance door for new onset aphasia; pt reports symptoms began about 1 hr ago; pt denies weakness or sensory changes; pt CAOx4 at this time

## 2014-12-20 NOTE — ED Provider Notes (Signed)
I saw and evaluated the patient, reviewed the resident's note and I agree with the findings and plan.   EKG Interpretation   Date/Time:  Wednesday December 20 2014 21:22:03 EDT Ventricular Rate:  124 PR Interval:    QRS Duration: 103 QT Interval:  355 QTC Calculation: 510 R Axis:   12 Text Interpretation:  Atrial fibrillation RSR' in V1 or V2, probably  normal variant Nonspecific ST depression, anterior leads Prolonged QT  interval Confirmed by Rogene Houston  MD, Freddrick Gladson [phone]) on 12/20/2014 9:34:28 PM      Results for orders placed or performed during the hospital encounter of 12/20/14  Protime-INR  Result Value Ref Range   Prothrombin Time 12.6 11.6 - 15.2 seconds   INR 0.93 0.00 - 1.49  APTT  Result Value Ref Range   aPTT 32 24 - 37 seconds  CBC  Result Value Ref Range   WBC 9.1 4.0 - 10.5 K/uL   RBC 5.56 4.22 - 5.81 MIL/uL   Hemoglobin 16.7 13.0 - 17.0 g/dL   HCT 48.2 39.0 - 52.0 %   MCV 86.7 78.0 - 100.0 fL   MCH 30.0 26.0 - 34.0 pg   MCHC 34.6 30.0 - 36.0 g/dL   RDW 13.7 11.5 - 15.5 %   Platelets 154 150 - 400 K/uL  Differential  Result Value Ref Range   Neutrophils Relative % 42 (L) 43 - 77 %   Neutro Abs 3.8 1.7 - 7.7 K/uL   Lymphocytes Relative 41 12 - 46 %   Lymphs Abs 3.8 0.7 - 4.0 K/uL   Monocytes Relative 14 (H) 3 - 12 %   Monocytes Absolute 1.2 (H) 0.1 - 1.0 K/uL   Eosinophils Relative 3 0 - 5 %   Eosinophils Absolute 0.2 0.0 - 0.7 K/uL   Basophils Relative 0 0 - 1 %   Basophils Absolute 0.0 0.0 - 0.1 K/uL  Comprehensive metabolic panel  Result Value Ref Range   Sodium 138 135 - 145 mmol/L   Potassium 3.5 3.5 - 5.1 mmol/L   Chloride 100 (L) 101 - 111 mmol/L   CO2 25 22 - 32 mmol/L   Glucose, Bld 99 65 - 99 mg/dL   BUN 18 6 - 20 mg/dL   Creatinine, Ser 1.20 0.61 - 1.24 mg/dL   Calcium 9.8 8.9 - 10.3 mg/dL   Total Protein 7.4 6.5 - 8.1 g/dL   Albumin 4.6 3.5 - 5.0 g/dL   AST 30 15 - 41 U/L   ALT 34 17 - 63 U/L   Alkaline Phosphatase 37 (L) 38 - 126  U/L   Total Bilirubin 0.8 0.3 - 1.2 mg/dL   GFR calc non Af Amer 55 (L) >60 mL/min   GFR calc Af Amer >60 >60 mL/min   Anion gap 13 5 - 15  I-stat troponin, ED (not at [facility], [facility])  Result Value Ref Range   Troponin i, poc 0.00 0.00 - 0.08 ng/mL   Comment 3          I-Stat Chem 8, ED  (not at [facility], [facility])  Result Value Ref Range   Sodium 138 135 - 145 mmol/L   Potassium 3.5 3.5 - 5.1 mmol/L   Chloride 99 (L) 101 - 111 mmol/L   BUN 23 (H) 6 - 20 mg/dL   Creatinine, Ser 1.20 0.61 - 1.24 mg/dL   Glucose, Bld 94 65 - 99 mg/dL   Calcium, Ion 1.11 (L) 1.13 - 1.30 mmol/L   TCO2 25 0 -  100 mmol/L   Hemoglobin 18.4 (H) 13.0 - 17.0 g/dL   HCT 54.0 (H) 39.0 - 52.0 %   Ct Head Wo Contrast  12/20/2014   CLINICAL DATA:  Code stroke.  Difficulty with speech.  EXAM: CT HEAD WITHOUT CONTRAST  TECHNIQUE: Contiguous axial images were obtained from the base of the skull through the vertex without intravenous contrast.  COMPARISON:  11/22/2013  FINDINGS: There is no evidence of mass effect, midline shift, or extra-axial fluid collections. There is no evidence of a space-occupying lesion or intracranial hemorrhage. There is left cerebellar low-attenuation concerning for a acute-subacute infarct. Small bilateral lacunar infarcts. There is generalized cerebral atrophy. There is periventricular white matter low attenuation likely secondary to microangiopathy.  The ventricles and sulci are appropriate for the patient's age. The basal cisterns are patent.  Visualized portions of the orbits are unremarkable. The visualized portions of the paranasal sinuses and mastoid air cells are unremarkable. Cerebrovascular atherosclerotic calcifications are noted.  The osseous structures are unremarkable.  IMPRESSION: 1. Acute versus subacute nonhemorrhagic left cerebellar infarct. These results were called by telephone at the time of interpretation on 12/20/2014 at 9:27 pm to [facility] , who verbally acknowledged these results.    Electronically Signed   By: Kathreen Devoid   On: 12/20/2014 21:28   CRITICAL CARE Performed by: Fredia Sorrow Total critical care time: 30 Critical care time was exclusive of separately billable procedures and treating other patients. Critical care was necessary to treat or prevent imminent or life-threatening deterioration. Critical care was time spent personally by me on the following activities: development of treatment plan with patient and/or surrogate as well as nursing, discussions with consultants, evaluation of patient's response to treatment, examination of patient, obtaining history from patient or surrogate, ordering and performing treatments and interventions, ordering and review of laboratory studies, ordering and review of radiographic studies, pulse oximetry and re-evaluation of patient's condition.   Patient with acute onset of a aphasia around 8:00 this evening approximately one hour prior to arrival. Patient drove himself to the hospital. Patient seen by me declared potential code stroke protocol orders put in place. Patient taken to head CT. CT with evidence of a possible acute or subacute nonhemorrhagic left cerebellar Beller infarct. Patient has had a stroke in the remote past like 15 years ago that affected his speech. Patient's speech abnormality is fairly mild no other focal deficits.  Patient seen by the neural hospitalist. Not a candidate for TPA due to NIH scale only being about 1. We'll make arrangements for MRI and admission to medicine service.  Fredia Sorrow, MD 12/20/14 2200

## 2014-12-20 NOTE — Consult Note (Signed)
Referring Physician: ED    Chief Complaint: code stroke, aphasia  HPI:                                                                                                                                         Steven Meadows is an 81 y.o. 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 Patient said that he was home was alone working on his taxes from last year when suddenly realized that he was having trouble putting thoughts together and was unable to remember who he was supposed to pick up and take to the golf tournament in the am. These symptoms were similar to his previous stroke 39yr ago. He drove himself to the hospital where on initial evaluation by ED attending he was noted to have trouble speaking, words were not coming out correctly.  Denies associated HA, vertigo, double vision, difficulty swallowing, focal weakness or numbness, or vision impairment. NIHSS 1 for language. CT brain was personally reviewed and showed no acute abnormality. A subsequent MRI/MRA brain were independently reviewed and demonstrated acute small infarction involving the right splenium of corpus callosum and no proximal or large arterial branch occlusion identified within the intracranial circulation respectively. Patient takes aspirin 325 mg BID.  Date last known well:  Time last known well:  tPA Given: no, minimal language deficit that is rapidly improving NIHSS: 1 MRS: 0  Past Medical History  Diagnosis Date  . Cough   . Lacunar stroke   . HTN (hypertension)   . Hypercholesteremia   . Aneurysm of vertebral artery   . Low back pain   . Rotator cuff tear   . History of mononucleosis   . Abdominal aortic atherosclerosis   . Actinic keratoses   . Olecranon bursitis     Past Surgical History  Procedure Laterality Date  .  Tonsillectomy and adenoidectomy    . Cataract extraction--right eye  2013    Family History  Problem Relation Age of Onset  . Heart attack Paternal Grandfather   . Heart attack Maternal Grandfather   . Cancer Paternal Grandmother    Social History:  reports that he has never smoked. He does not have any smokeless tobacco history on file. He reports that he drinks alcohol. He reports that he does not use illicit drugs. Family history: no MS, PD, brain tumor, or epilepsy. Allergies:  Allergies  Allergen Reactions  . Nabumetone     Gi upset    Medications:  I have reviewed the patient's current medications.  ROS:                                                                                                                                       History obtained from chart review and the patient  General ROS: negative for - chills, fatigue, fever, night sweats, weight gain or weight loss Psychological ROS: negative for - behavioral disorder, hallucinations, memory difficulties, mood swings or suicidal ideation Ophthalmic ROS: negative for - blurry vision, double vision, eye pain or loss of vision ENT ROS: negative for - epistaxis, nasal discharge, oral lesions, sore throat, tinnitus or vertigo Allergy and Immunology ROS: negative for - hives or itchy/watery eyes Hematological and Lymphatic ROS: negative for - bleeding problems, bruising or swollen lymph nodes Endocrine ROS: negative for - galactorrhea, hair pattern changes, polydipsia/polyuria or temperature intolerance Respiratory ROS: negative for - cough, hemoptysis, shortness of breath or wheezing Cardiovascular ROS: negative for - chest pain, dyspnea on exertion, edema or irregular heartbeat Gastrointestinal ROS: negative for - abdominal pain, diarrhea, hematemesis, nausea/vomiting or stool  incontinence Genito-Urinary ROS: negative for - dysuria, hematuria, incontinence or urinary frequency/urgency Musculoskeletal ROS: negative for - joint swelling or muscular weakness Neurological ROS: as noted in HPI Dermatological ROS: negative for rash and skin lesion changes   Physical exam:  Constitutional: well developed, pleasant male in no apparent distress.  BP 140/70, P 82, R 17, afebrile. Weight 101.1 kg (222 lb 14.2 oz). Eyes: no jaundice or exophthalmos.  Head: normocephalic. Neck: supple, no bruits, no JVD. Cardiac: no murmurs. Lungs: clear. Abdomen: soft, no tender, no mass. Extremities: no edema, clubbing, or cyanosis. CV:  pulses palpable throughout  Skin: no rash Neurologic Examination:                                                                                                      General: Mental Status: Alert, oriented, thought content appropriate.  Mild motor dysphasia.  Able to follow 3 step commands without difficulty. Cranial Nerves: II: Discs flat bilaterally; Visual fields grossly normal, pupils equal, round, reactive to light and accommodation III,IV, VI: ptosis not present, extra-ocular motions intact bilaterally V,VII: smile symmetric, facial light touch sensation normal bilaterally VIII: hearing normal bilaterally IX,X: uvula rises symmetrically XI: bilateral shoulder shrug XII: midline tongue extension without atrophy or fasciculations Motor: Right : Upper extremity   5/5    Left:     Upper extremity   5/5  Lower extremity  5/5     Lower extremity   5/5 Tone and bulk:normal tone throughout; no atrophy noted Sensory: Pinprick and light touch intact throughout, bilaterally Deep Tendon Reflexes:  Right: Upper Extremity   Left: Upper extremity   biceps (C-5 to C-6) 2/4   biceps (C-5 to C-6) 2/4 tricep (C7) 2/4    triceps (C7) 2/4 Brachioradialis (C6) 2/4  Brachioradialis (C6) 2/4  Lower Extremity Lower Extremity  quadriceps (L-2 to L-4) 2/4    quadriceps (L-2 to L-4) 2/4 Achilles (S1) 2/4   Achilles (S1) 2/4  Plantars: Right: downgoing   Left: downgoing Cerebellar: normal finger-to-nose,  normal heel-to-shin test Gait:  No ataxia.    Results for orders placed or performed during the hospital encounter of 12/20/14 (from the past 48 hour(s))  Protime-INR     Status: None   Collection Time: 12/20/14  9:05 PM  Result Value Ref Range   Prothrombin Time 12.6 11.6 - 15.2 seconds   INR 0.93 0.00 - 1.49  APTT     Status: None   Collection Time: 12/20/14  9:05 PM  Result Value Ref Range   aPTT 32 24 - 37 seconds  CBC     Status: None   Collection Time: 12/20/14  9:05 PM  Result Value Ref Range   WBC 9.1 4.0 - 10.5 K/uL   RBC 5.56 4.22 - 5.81 MIL/uL   Hemoglobin 16.7 13.0 - 17.0 g/dL   HCT 48.2 39.0 - 52.0 %   MCV 86.7 78.0 - 100.0 fL   MCH 30.0 26.0 - 34.0 pg   MCHC 34.6 30.0 - 36.0 g/dL   RDW 13.7 11.5 - 15.5 %   Platelets 154 150 - 400 K/uL  Differential     Status: Abnormal   Collection Time: 12/20/14  9:05 PM  Result Value Ref Range   Neutrophils Relative % 42 (L) 43 - 77 %   Neutro Abs 3.8 1.7 - 7.7 K/uL   Lymphocytes Relative 41 12 - 46 %   Lymphs Abs 3.8 0.7 - 4.0 K/uL   Monocytes Relative 14 (H) 3 - 12 %   Monocytes Absolute 1.2 (H) 0.1 - 1.0 K/uL   Eosinophils Relative 3 0 - 5 %   Eosinophils Absolute 0.2 0.0 - 0.7 K/uL   Basophils Relative 0 0 - 1 %   Basophils Absolute 0.0 0.0 - 0.1 K/uL  Comprehensive metabolic panel     Status: Abnormal   Collection Time: 12/20/14  9:05 PM  Result Value Ref Range   Sodium 138 135 - 145 mmol/L   Potassium 3.5 3.5 - 5.1 mmol/L   Chloride 100 (L) 101 - 111 mmol/L   CO2 25 22 - 32 mmol/L   Glucose, Bld 99 65 - 99 mg/dL   BUN 18 6 - 20 mg/dL   Creatinine, Ser 1.20 0.61 - 1.24 mg/dL   Calcium 9.8 8.9 - 10.3 mg/dL   Total Protein 7.4 6.5 - 8.1 g/dL   Albumin 4.6 3.5 - 5.0 g/dL   AST 30 15 - 41 U/L   ALT 34 17 - 63 U/L   Alkaline Phosphatase 37 (L) 38 - 126 U/L    Total Bilirubin 0.8 0.3 - 1.2 mg/dL   GFR calc non Af Amer 55 (L) >60 mL/min   GFR calc Af Amer >60 >60 mL/min    Comment: (NOTE) The eGFR has been calculated using the CKD EPI equation. This calculation has not been validated in all clinical situations. eGFR's persistently <60 mL/min  signify possible Chronic Kidney Disease.    Anion gap 13 5 - 15  I-stat troponin, ED (not at [facility], [facility])     Status: None   Collection Time: 12/20/14  9:10 PM  Result Value Ref Range   Troponin i, poc 0.00 0.00 - 0.08 ng/mL   Comment 3            Comment: Due to the release kinetics of cTnI, a negative result within the first hours of the onset of symptoms does not rule out myocardial infarction with certainty. If myocardial infarction is still suspected, repeat the test at appropriate intervals.   I-Stat Chem 8, ED  (not at [facility], [facility])     Status: Abnormal   Collection Time: 12/20/14  9:13 PM  Result Value Ref Range   Sodium 138 135 - 145 mmol/L   Potassium 3.5 3.5 - 5.1 mmol/L   Chloride 99 (L) 101 - 111 mmol/L   BUN 23 (H) 6 - 20 mg/dL   Creatinine, Ser 1.20 0.61 - 1.24 mg/dL   Glucose, Bld 94 65 - 99 mg/dL   Calcium, Ion 1.11 (L) 1.13 - 1.30 mmol/L   TCO2 25 0 - 100 mmol/L   Hemoglobin 18.4 (H) 13.0 - 17.0 g/dL   HCT 54.0 (H) 39.0 - 52.0 %   Ct Head Wo Contrast  12/20/2014   CLINICAL DATA:  Code stroke.  Difficulty with speech.  EXAM: CT HEAD WITHOUT CONTRAST  TECHNIQUE: Contiguous axial images were obtained from the base of the skull through the vertex without intravenous contrast.  COMPARISON:  11/22/2013  FINDINGS: There is no evidence of mass effect, midline shift, or extra-axial fluid collections. There is no evidence of a space-occupying lesion or intracranial hemorrhage. There is left cerebellar low-attenuation concerning for a acute-subacute infarct. Small bilateral lacunar infarcts. There is generalized cerebral atrophy. There is periventricular white matter low attenuation likely  secondary to microangiopathy.  The ventricles and sulci are appropriate for the patient's age. The basal cisterns are patent.  Visualized portions of the orbits are unremarkable. The visualized portions of the paranasal sinuses and mastoid air cells are unremarkable. Cerebrovascular atherosclerotic calcifications are noted.  The osseous structures are unremarkable.  IMPRESSION: 1. Acute versus subacute nonhemorrhagic left cerebellar infarct. These results were called by telephone at the time of interpretation on 12/20/2014 at 9:27 pm to [facility] , who verbally acknowledged these results.   Electronically Signed   By: Kathreen Devoid   On: 12/20/2014 21:28    Assessment: 81 y.o. male presents with acute language impairment that is rapidly improving in the ED. NIHSS 1. MRI/MRA brain revealed acute small infarction involving the right splenium of corpus callosum and no proximal or large arterial branch occlusion identified within the intracranial circulation respectively.  He is within the window for thrombolysis but NIHSS 1 for very minimal motor dysphasia that is rapidly improving, and thus did not administer IV tpa. Admit to medicine. Ordered complete stroke work up. Stroke team will follow up tomorrow.  Stroke Risk Factors - age, HTN, hypercholesterolemia, stroke  Plan: 1. HgbA1c, fasting lipid panel 2. MRI, MRA  of the brain without contrast 3. Echocardiogram 4. Carotid dopplers 5. Prophylactic therapy-plavix 6. Risk factor modification 7. Telemetry monitoring 8. Frequent neuro checks 9. PT/OT SLP 10. NPO   Dorian Pod, MD [facility] [phone]  12/20/2014, 10:03 PM

## 2014-12-20 NOTE — ED Provider Notes (Signed)
CSN: [id number]     Arrival date & time 12/20/14  2106 History   First MD Initiated Contact with Patient 12/20/14 2117     Chief Complaint  Patient presents with  . Aphasia    (Consider location/radiation/quality/duration/timing/severity/associated sxs/prior Treatment) The history is provided by the patient. No language interpreter was used.  Patient is an 81 year old male with past medical history of hypertension, hyperlipidemia, TIA who presents today with speech abnormality that started around 7 PM this evening. Patient relates he was sitting at home at his desk working on his income packs and noticed that he started to feel slightly confused and when he tried to speak he could not get his words out like he wanted. Patient states during this time he did not have any headache, chest pain, shortness of breath. He denied any diaphoresis or nausea or vomiting. He states he got in his car and drove himself to the hospital. From here code stroke was called and neurology saw immediately. Patient had imaging performed in a timely manner with notable left cerebellar infarct that could be acute to subacute. At this point neurology is not recommending TPA as the patient has minimal neurological deficits. Patient relates that his symptoms seem nearly resolved and that his speech is almost back to its normal baseline. He denies any other symptoms at this point.  Past Medical History  Diagnosis Date  . Cough   . Lacunar stroke   . HTN (hypertension)   . Hypercholesteremia   . Aneurysm of vertebral artery   . Low back pain   . Rotator cuff tear   . History of mononucleosis   . Abdominal aortic atherosclerosis   . Actinic keratoses   . Olecranon bursitis    Past Surgical History  Procedure Laterality Date  . Tonsillectomy and adenoidectomy    . Cataract extraction--right eye  2013   Family History  Problem Relation Age of Onset  . Heart attack Paternal Grandfather   . Heart attack Maternal  Grandfather   . Cancer Paternal Grandmother    Social History  Substance Use Topics  . Smoking status: Never Smoker   . Smokeless tobacco: None  . Alcohol Use: Yes     Comment: 2 drinks per night    Review of Systems  Constitutional: Negative for fever and chills.  HENT: Negative for congestion and rhinorrhea.   Eyes: Negative for photophobia and visual disturbance.  Respiratory: Negative for shortness of breath and wheezing.   Cardiovascular: Negative for chest pain and palpitations.  Gastrointestinal: Negative for nausea, vomiting and abdominal pain.  Genitourinary: Negative for dysuria and difficulty urinating.  Musculoskeletal: Negative for back pain and neck pain.  Skin: Negative for pallor and rash.  Neurological: Positive for speech difficulty (as described in history of present illness). Negative for tremors, syncope, weakness (Denies any focal weakness in any of his extremities.), light-headedness, numbness and headaches.  Psychiatric/Behavioral: Positive for confusion (States initially he felt confused and unable to get his words out.). Negative for agitation.  All other systems reviewed and are negative.     Allergies  Nabumetone  Home Medications   Prior to Admission medications   Medication Sig Start Date End Date Taking? Authorizing Provider  aspirin 325 MG EC tablet Take 325 mg by mouth 2 (two) times daily.   Yes Historical Provider, MD  chlorthalidone (HYGROTON) 25 MG tablet Take 25 mg by mouth every Monday, Wednesday, and Friday.    Yes Historical Provider, MD  ezetimibe (ZETIA) 10 MG  tablet Take 10 mg by mouth daily.   Yes Historical Provider, MD  loratadine (CLARITIN) 10 MG tablet Take 10 mg by mouth daily.   Yes Historical Provider, MD  omeprazole (PRILOSEC) 20 MG capsule Take 20 mg by mouth daily. 06/17/11  Yes Collene Gobble, MD  valsartan (DIOVAN) 320 MG tablet Take 320 mg by mouth at bedtime.    Yes Historical Provider, MD  diphenhydrAMINE (BENADRYL) 25  MG tablet Take 2 tablets (50 mg total) by mouth every 6 (six) hours. Take 1-2 tablets every 6 hours x 2 days, then space out to an as needed basis 01/10/14   Alfonzo Beers, MD  oxyCODONE-acetaminophen (PERCOCET/ROXICET) 5-325 MG per tablet Take 1-2 tablets by mouth every 6 (six) hours as needed for severe pain. 01/10/14   Alfonzo Beers, MD  predniSONE (DELTASONE) 10 MG tablet Take 6 tablets (60 mg total) by mouth daily. Take 6, 5, 4, 3, 2, 1 tabs po qD x 3 days each 01/10/14   Alfonzo Beers, MD   BP 162/104 mmHg  Pulse 105  Temp(Src) 98.2 F (36.8 C)  Resp 23  Wt 222 lb 14.2 oz (101.1 kg)  SpO2 98% Physical Exam  Constitutional: He is oriented to person, place, and time. No distress.  Elderly appearing male.  HENT:  Head: Normocephalic and atraumatic.  Eyes: Conjunctivae and EOM are normal. Pupils are equal, round, and reactive to light.  Neck: Normal range of motion. Neck supple.  Cardiovascular: Normal rate, regular rhythm and normal heart sounds.   No murmur heard. Pulmonary/Chest: Effort normal and breath sounds normal. No respiratory distress. He has no wheezes.  Abdominal: Soft. He exhibits no distension. There is no tenderness. There is no guarding.  Musculoskeletal: Normal range of motion. He exhibits no tenderness.  Neurological: He is alert and oriented to person, place, and time. No cranial nerve deficit (cranial nerves II through XII intact). Coordination (Normal finger to nose bilaterally) normal.  5 out of 5 strength throughout. Sensation is intact throughout.   Skin: Skin is warm and dry. He is not diaphoretic.  Psychiatric: He has a normal mood and affect. His behavior is normal.  Nursing note and vitals reviewed.   ED Course  Procedures (including critical care time) Labs Review Labs Reviewed  DIFFERENTIAL - Abnormal; Notable for the following:    Neutrophils Relative % 42 (*)    Monocytes Relative 14 (*)    Monocytes Absolute 1.2 (*)    All other components within  normal limits  COMPREHENSIVE METABOLIC PANEL - Abnormal; Notable for the following:    Chloride 100 (*)    Alkaline Phosphatase 37 (*)    GFR calc non Af Amer 55 (*)    All other components within normal limits  I-STAT CHEM 8, ED - Abnormal; Notable for the following:    Chloride 99 (*)    BUN 23 (*)    Calcium, Ion 1.11 (*)    Hemoglobin 18.4 (*)    HCT 54.0 (*)    All other components within normal limits  PROTIME-INR  APTT  CBC  HEMOGLOBIN A1C  LIPID PANEL  I-STAT TROPOININ, ED  CBG MONITORING, ED    Imaging Review Ct Head Wo Contrast  12/20/2014   CLINICAL DATA:  Code stroke.  Difficulty with speech.  EXAM: CT HEAD WITHOUT CONTRAST  TECHNIQUE: Contiguous axial images were obtained from the base of the skull through the vertex without intravenous contrast.  COMPARISON:  11/22/2013  FINDINGS: There is no evidence of  mass effect, midline shift, or extra-axial fluid collections. There is no evidence of a space-occupying lesion or intracranial hemorrhage. There is left cerebellar low-attenuation concerning for a acute-subacute infarct. Small bilateral lacunar infarcts. There is generalized cerebral atrophy. There is periventricular white matter low attenuation likely secondary to microangiopathy.  The ventricles and sulci are appropriate for the patient's age. The basal cisterns are patent.  Visualized portions of the orbits are unremarkable. The visualized portions of the paranasal sinuses and mastoid air cells are unremarkable. Cerebrovascular atherosclerotic calcifications are noted.  The osseous structures are unremarkable.  IMPRESSION: 1. Acute versus subacute nonhemorrhagic left cerebellar infarct. These results were called by telephone at the time of interpretation on 12/20/2014 at 9:27 pm to [facility] , who verbally acknowledged these results.   Electronically Signed   By: Kathreen Devoid   On: 12/20/2014 21:28   I have personally reviewed and evaluated these images and lab results as  part of my medical decision-making.   EKG Interpretation   Date/Time:  Wednesday December 20 2014 21:22:03 EDT Ventricular Rate:  124 PR Interval:    QRS Duration: 103 QT Interval:  355 QTC Calculation: 510 R Axis:   12 Text Interpretation:  Atrial fibrillation RSR' in V1 or V2, probably  normal variant Nonspecific ST depression, anterior leads Prolonged QT  interval Confirmed by ZACKOWSKI  MD, SCOTT [phone]) on 12/20/2014 9:34:28 PM      MDM   Final diagnoses:  Speech abnormality  Transient cerebral ischemia, unspecified transient cerebral ischemia type    Patient is an 81 year old male with past medical history of hypertension, hyperlipidemia, TIA who presents today with speech abnormality that started around 7 PM this evening. Findings today are concerning for TIA versus small left cerebellar stroke. No obvious focal neurological deficits on exam. Patient is awake and alert and in stable condition. Neurology recommends admission to hospitalist for TIA workup. Patient is aware of this plan and agreeable with that. Hospitalist consulted and will admit the patient for further evaluation and management.  Patient does notably have atrial fibrillation noted on EKG obtained today. This is likely risk factor for today's event and also increases patient's risk for subsequent notes. Per hospitalist recommendation patient was started on a diltiazem drip for rate control.    Theodosia Quay, MD 12/20/14 4801  Theodosia Quay, MD 12/20/14 2352

## 2014-12-20 NOTE — ED Notes (Signed)
Patient transported to MRI with RR RN and ED RN

## 2014-12-20 NOTE — Code Documentation (Signed)
[name] is an 81yo wm that was working on his taxes at home when he began having difficulty putting thoughts together.  He was unable to remember who he was supposed to pick up and take to the golf tournament in the am.  These symptoms were similar to his previous stroke 24yrs ago.  He drove himself to the hospital.  He is active at baseline and still works.  His wife is out of town.  NIH 1 for aphasia.  He has trouble finding words and stumbles through others.  His symptoms improved while in the CT scanner but began to worsen again during the examination.  Pt taken to MRI.

## 2014-12-20 NOTE — ED Notes (Signed)
Pt back to room B18

## 2014-12-21 ENCOUNTER — Inpatient Hospital Stay (HOSPITAL_COMMUNITY): Payer: BLUE CROSS/BLUE SHIELD

## 2014-12-21 DIAGNOSIS — I639 Cerebral infarction, unspecified: Secondary | ICD-10-CM | POA: Insufficient documentation

## 2014-12-21 DIAGNOSIS — Z8673 Personal history of transient ischemic attack (TIA), and cerebral infarction without residual deficits: Secondary | ICD-10-CM | POA: Diagnosis present

## 2014-12-21 DIAGNOSIS — I726 Aneurysm of vertebral artery: Secondary | ICD-10-CM | POA: Diagnosis present

## 2014-12-21 DIAGNOSIS — I6789 Other cerebrovascular disease: Secondary | ICD-10-CM

## 2014-12-21 DIAGNOSIS — I482 Chronic atrial fibrillation, unspecified: Secondary | ICD-10-CM | POA: Diagnosis present

## 2014-12-21 DIAGNOSIS — I4891 Unspecified atrial fibrillation: Secondary | ICD-10-CM | POA: Diagnosis present

## 2014-12-21 LAB — RAPID URINE DRUG SCREEN, HOSP PERFORMED
Amphetamines: NOT DETECTED
Barbiturates: NOT DETECTED
Benzodiazepines: NOT DETECTED
Cocaine: NOT DETECTED
Opiates: NOT DETECTED
Tetrahydrocannabinol: NOT DETECTED

## 2014-12-21 LAB — LIPID PANEL
Cholesterol: 126 mg/dL (ref 0–200)
HDL: 49 mg/dL (ref >40)
LDL Cholesterol: 62 mg/dL (ref 0–99)
Total CHOL/HDL Ratio: 2.6 RATIO
Triglycerides: 75 mg/dL (ref <150)
VLDL: 15 mg/dL (ref 0–40)

## 2014-12-21 LAB — MRSA PCR SCREENING: MRSA by PCR: NEGATIVE

## 2014-12-21 MED ORDER — EZETIMIBE 10 MG PO TABS
10.0000 mg | ORAL_TABLET | Freq: Every day | ORAL | Status: DC
  Administered 2014-12-21 – 2014-12-23 (×3): 10 mg via ORAL
  Filled 2014-12-21 (×3): qty 1

## 2014-12-21 MED ORDER — ENOXAPARIN SODIUM 30 MG/0.3ML ~~LOC~~ SOLN
30.0000 mg | SUBCUTANEOUS | Status: DC
  Administered 2014-12-21: 30 mg via SUBCUTANEOUS
  Filled 2014-12-21 (×2): qty 0.3

## 2014-12-21 MED ORDER — DILTIAZEM HCL 100 MG IV SOLR
2.5000 mg/h | INTRAVENOUS | Status: DC
  Administered 2014-12-21 (×2): 5 mg/h via INTRAVENOUS
  Filled 2014-12-21: qty 100

## 2014-12-21 MED ORDER — LORATADINE 10 MG PO TABS
10.0000 mg | ORAL_TABLET | Freq: Every day | ORAL | Status: DC
  Administered 2014-12-21 – 2014-12-23 (×3): 10 mg via ORAL
  Filled 2014-12-21 (×3): qty 1

## 2014-12-21 MED ORDER — ASPIRIN 325 MG PO TABS
325.0000 mg | ORAL_TABLET | Freq: Every day | ORAL | Status: DC
  Administered 2014-12-21: 325 mg via ORAL
  Filled 2014-12-21 (×2): qty 1

## 2014-12-21 MED ORDER — STROKE: EARLY STAGES OF RECOVERY BOOK
Freq: Once | Status: AC
  Administered 2014-12-21: 02:00:00
  Filled 2014-12-21: qty 1

## 2014-12-21 MED ORDER — SENNOSIDES-DOCUSATE SODIUM 8.6-50 MG PO TABS: 1.0000 | ORAL_TABLET | Freq: Every evening | ORAL | Status: DC | PRN

## 2014-12-21 MED ORDER — PANTOPRAZOLE SODIUM 40 MG PO TBEC
40.0000 mg | DELAYED_RELEASE_TABLET | Freq: Every day | ORAL | Status: DC
  Administered 2014-12-21 – 2014-12-23 (×3): 40 mg via ORAL
  Filled 2014-12-21 (×3): qty 1

## 2014-12-21 MED ORDER — ASPIRIN 300 MG RE SUPP
300.0000 mg | Freq: Every day | RECTAL | Status: DC
  Filled 2014-12-21 (×2): qty 1

## 2014-12-21 MED ORDER — APIXABAN 5 MG PO TABS
5.0000 mg | ORAL_TABLET | Freq: Two times a day (BID) | ORAL | Status: DC
  Administered 2014-12-21 – 2014-12-23 (×5): 5 mg via ORAL
  Filled 2014-12-21 (×6): qty 1

## 2014-12-21 MED ORDER — HYDRALAZINE HCL 20 MG/ML IJ SOLN: 5.0000 mg | Freq: Three times a day (TID) | INTRAMUSCULAR | Status: DC | PRN

## 2014-12-21 MED ORDER — IRBESARTAN 300 MG PO TABS
300.0000 mg | ORAL_TABLET | Freq: Every day | ORAL | Status: DC
  Administered 2014-12-21 – 2014-12-23 (×3): 300 mg via ORAL
  Filled 2014-12-21 (×3): qty 1

## 2014-12-21 MED ORDER — PERFLUTREN LIPID MICROSPHERE
1.0000 mL | INTRAVENOUS | Status: AC | PRN
  Administered 2014-12-21: 2 mL via INTRAVENOUS
  Filled 2014-12-21: qty 10

## 2014-12-21 NOTE — Progress Notes (Signed)
  Echocardiogram 2D Echocardiogram has been performed.  Darlina Sicilian M 12/21/2014, 3:14 PM

## 2014-12-21 NOTE — Progress Notes (Addendum)
STROKE TEAM PROGRESS NOTE   HISTORY Steven Meadows is an 81 y.o. 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 Patient said that he was home was alone working on his taxes from last year when suddenly realized that he was having trouble putting thoughts together and was unable to remember who he was supposed to pick up and take to the golf tournament in the am. These symptoms were similar to his previous stroke 73yrs ago. He drove himself to the hospital where on initial evaluation by ED attending he was noted to have trouble speaking, words were not coming out correctly.  Denies associated HA, vertigo, double vision, difficulty swallowing, focal weakness or numbness, or vision impairment. NIHSS 1 for language. CT brain was personally reviewed and showed no acute abnormality. A subsequent MRI/MRA brain were independently reviewed and demonstrated acute small infarction involving the right splenium of corpus callosum and no proximal or large arterial branch occlusion identified within the intracranial circulation respectively. Patient takes aspirin 325 mg BID.  Date last known well:  Time last known well:  tPA Given: no, minimal language deficit that is rapidly improving NIHSS: 1 MRS: 0   SUBJECTIVE (INTERVAL HISTORY) No family is at the bedside.  Overall he feels his condition is completely resolved. He stated that he is able to speak as normal now. 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 Ellene Route at [place].   OBJECTIVE Temp:  [97.9 F (36.6 C)-98.2 F (36.8 C)] 97.9 F (36.6 C) (08/18 0400) Pulse Rate:  [32-116] 81 (08/18 0400) Cardiac Rhythm:  [-] Atrial fibrillation (08/18 0400) Resp:  [0-23] 0 (08/18 0400) BP: (128-171)/(80-111) 128/80 mmHg (08/18 0400) SpO2:  [94 %-98 %] 95 % (08/18  0400) Weight:  [97.5 kg (214 lb 15.2 oz)-101.1 kg (222 lb 14.2 oz)] 97.5 kg (214 lb 15.2 oz) (08/18 0050)  No results for input(s): GLUCAP in the last 168 hours.  Recent Labs Lab 12/20/14 2105 12/20/14 2113  NA 138 138  K 3.5 3.5  CL 100* 99*  CO2 25  --   GLUCOSE 99 94  BUN 18 23*  CREATININE 1.20 1.20  CALCIUM 9.8  --     Recent Labs Lab 12/20/14 2105  AST 30  ALT 34  ALKPHOS 37*  BILITOT 0.8  PROT 7.4  ALBUMIN 4.6    Recent Labs Lab 12/20/14 2105 12/20/14 2113  WBC 9.1  --   NEUTROABS 3.8  --   HGB 16.7 18.4*  HCT 48.2 54.0*  MCV 86.7  --   PLT 154  --    No results for input(s): CKTOTAL, CKMB, CKMBINDEX, TROPONINI in the last 168 hours.  Recent Labs  12/20/14 2105  LABPROT 12.6  INR 0.93   No results for input(s): COLORURINE, LABSPEC, PHURINE, GLUCOSEU, HGBUR, BILIRUBINUR, KETONESUR, PROTEINUR, UROBILINOGEN, NITRITE, LEUKOCYTESUR in the last 72 hours.  Invalid input(s): APPERANCEUR     Component Value Date/Time   CHOL 126 12/21/2014 0345   TRIG 75 12/21/2014 0345   HDL 49 12/21/2014 0345   CHOLHDL 2.6 12/21/2014 0345   VLDL 15 12/21/2014 0345   LDLCALC 62 12/21/2014 0345   No results found for: HGBA1C No results found for: LABOPIA, COCAINSCRNUR, LABBENZ, AMPHETMU, THCU, LABBARB  No results for input(s): ETH in the last 168 hours.   IMAGING  Ct Head  Wo Contrast 12/20/2014    1. Acute versus subacute nonhemorrhagic left cerebellar infarct.   Mr [name] Head Wo Contrast 12/20/2014    MRI HEAD   1. Acute 7 mm ischemic nonhemorrhagic infarct involving the right splenium without mass effect.  2. Atrophy with chronic microvascular ischemic disease and multiple small remote lacunar infarcts as above.   MRA HEAD  1. No proximal or large arterial branch occlusion identified within the intracranial circulation.  2. No hemodynamically significant or correctable stenosis identified.  3. Diminutive vertebrobasilar system, with fetal origin of the left  PCA and the right PCA supplied largely via a prominent right posterior communicating artery.  4. 8 x 6 mm fusiform aneurysm arising from the distal right vertebral artery/vertebrobasilar junction.  5. Diminutive left vertebral artery terminates in PICA.     CUS - pending  2D echo - pending   PHYSICAL EXAM  Temp:  [97.4 F (36.3 C)-98.2 F (36.8 C)] 97.4 F (36.3 C) (08/18 0800) Pulse Rate:  [32-116] 86 (08/18 0800) Resp:  [0-23] 14 (08/18 0800) BP: (128-171)/(80-111) 161/86 mmHg (08/18 0800) SpO2:  [94 %-98 %] 95 % (08/18 0800) Weight:  [214 lb 15.2 oz (97.5 kg)-222 lb 14.2 oz (101.1 kg)] 214 lb 15.2 oz (97.5 kg) (08/18 0050)  General - Well nourished, well developed, in no apparent distress.  Ophthalmologic - Fundi not visualized due to eye movement.  Cardiovascular - irregularly irregular heart rate and rhythm.  Mental Status -  Level of arousal and orientation to time, place, and person were intact. Language including expression, naming, repetition, comprehension was assessed and found intact. Fund of Knowledge was assessed and was intact.  Cranial Nerves II - XII - II - Visual field intact OU. III, IV, VI - Extraocular movements intact. V - Facial sensation intact bilaterally. VII - Facial movement intact bilaterally. VIII - Hearing & vestibular intact bilaterally. X - Palate elevates symmetrically. XI - Chin turning & shoulder shrug intact bilaterally. XII - Tongue protrusion intact.  Motor Strength - The patient's strength was normal in all extremities and pronator drift was absent.  Bulk was normal and fasciculations were absent.   Motor Tone - Muscle tone was assessed at the neck and appendages and was normal.  Reflexes - The patient's reflexes were 1+ in all extremities and he had no pathological reflexes.  Sensory - Light touch, temperature/pinprick were assessed and were symmetrical.    Coordination - The patient had normal movements in the hands and feet  with no ataxia or dysmetria.  Tremor was absent.  Gait and Station - The patient's transfers, posture, gait, station, and turns were observed as normal.  ASSESSMENT/PLAN Steven Meadows is a 81 y.o. male with history of hypertension, hyperlipidemia, previous infarcts, vertebral artery aneurysm, presenting with confusion and speech difficulties. He did not receive IV t-PA due to minimal improving deficits.   Stroke:  Non-dominant infarct probably embolic secondary to newly diagnosed atrial fibrillation.  Resultant  improvement in deficits  MRI  Acute 7 mm ischemic nonhemorrhagic infarct involving the right splenium without mass effect.   MRA  8 x 6 mm fusiform aneurysm arising from the distal right vertebral artery/vertebrobasilar junction.   Carotid Doppler pending  2D Echo pending  LDL 62  HgbA1c pending  Lovenox for VTE prophylaxis  Diet NPO time specified  aspirin 325 mg orally every day prior to admission, now on aspirin 300 mg suppository daily. Due to small infarct size and afib as the cause of stroke,  recommend eliquis 5mg  bid for stroke prevention. Have discussed with Dr. Kathyrn Sheriff in [place] for his stable right VA aneurysm, we felt benefit is more than risk and we both agree eliquis is appropriate for him. He is following up with Dr. Ellene Route as outpt and he needs to continue follow up with Dr. Ellene Route after discharge.  Patient counseled to be compliant with his antithrombotic medications  Ongoing aggressive stroke risk factor management  Therapy recommendations:  Pending  Disposition: Pending  Afib with RVR  On cardizem  Cardiology on board  Recommend eliquis 5 mg bid for stroke prevention  CHA2DS2-VASc = 6         Condition Points   C CHF (or Left ventricular systolic dysfunction) 0   H HTN: BP consistently above 140/90 mmHg (or treated HTN on meds)  1   A2 Age ?89 years 2   D DM 0   S2 Prior Stroke or TIA or thromboembolism 2   V Vascular disease (e.g.  PAD, MI, aortic plaque) 1   A Age 2-74 years 54   [place] male sex  0           Annual Stroke Risk CHA2DS2-VASc Score Stroke Risk % 95% CI  0 0  1 1.3  2 2.2  3 3.2  4 4.0  5 6.7  6 9.8  7 9.6  8 12.5  9 15.2      Right VA aneurysm  Stable over the years  Following up with Dr. Ellene Route as outpt  Have discussed with Dr. Kathyrn Sheriff in [place] for his stable right VA aneurysm, we felt benefit is more than risk and we both agree eliquis is appropriate for him.  He needs continue follow up with Dr. Ellene Route after discharge.  Hypertension  Home meds: Diovan and chlorthalidone  Stable  Patient counseled to be compliant with his blood pressure medications  Hyperlipidemia  Home meds: Zetia 10 mg daily  resumed in hospital  LDL 62, goal < 70  Other Stroke Risk Factors  Advanced age  Obesity, Body mass index is 29.15 kg/(m^2).   Hx stroke/TIA  Other Active Problems    Other Pertinent History    Hospital day # 1  Rosalin Hawking, MD PhD Stroke Neurology 12/21/2014 9:29 AM   To contact Stroke Continuity provider, please refer to [email or web address] After hours, contact General Neurology

## 2014-12-21 NOTE — H&P (Signed)
[facility] Admission History and Physical       Steven Meadows ZOX:[id number] DOB: 08-31-1931 DOA: 12/20/2014  Referring physician: EDP PCP: Henrine Screws, MD  Specialists:   Chief Complaint: Difficulty Speaking  HPI: Steven Meadows is a 81 y.o. male with a history of Remote CVA 15 years ago, HTN, Hyperlipidemia, and Vertebral Artery Aneurysm who presents to the ED with complaints of sudden onset of difficulty speaking at 7 pm while he was at home working on his taxes.  He was unable to get any words out and trouble with concentrating.   He drove himself to the ED and was evaluated as a Code Stroke and seen by Neurology Dr Aram Beecham.  A CT scan of the Head and an MRI of the Brain were performed and revealed a +CVA. He was not deemed a TPA candidate.  He was also found to be in Atrial fibrillation at the time, and he denies any previous diagnosis of Atrial Fibrillation.    He was referred for medical admission.       Review of Systems:  Constitutional: No Weight Loss, No Weight Gain, Night Sweats, Fevers, Chills, Dizziness, Light Headedness, Fatigue, or Generalized Weakness HEENT: No Headaches, Difficulty Swallowing,Tooth/Dental Problems,Sore Throat,  No Sneezing, Rhinitis, Ear Ache, Nasal Congestion, or Post Nasal Drip,  Cardio-vascular:  No Chest pain, Orthopnea, PND, Edema in Lower Extremities, Anasarca, Dizziness, Palpitations  Resp: No Dyspnea, No DOE, No Productive Cough, No Non-Productive Cough, No Hemoptysis, No Wheezing.    GI: No Heartburn, Indigestion, Abdominal Pain, Nausea, Vomiting, Diarrhea, Constipation, Hematemesis, Hematochezia, Melena, Change in Bowel Habits,  Loss of Appetite  GU: No Dysuria, No Change in Color of Urine, No Urgency or Urinary Frequency, No Flank pain.  Musculoskeletal: No Joint Pain or Swelling, No Decreased Range of Motion, No Back Pain.  Neurologic: No Syncope, No Seizures, Muscle Weakness, Paresthesia, Vision Disturbance or Loss, No  Diplopia, No Vertigo, +Aphasia, No Difficulty Walking,  Skin: No Rash or Lesions. Psych: No Change in Mood or Affect, No Depression or Anxiety, No Memory loss, No Confusion, or Hallucinations  Past Medical History  Diagnosis Date  . Cough   . Lacunar stroke   . HTN (hypertension)   . Hypercholesteremia   . Aneurysm of vertebral artery   . Low back pain   . Rotator cuff tear   . History of mononucleosis   . Abdominal aortic atherosclerosis   . Actinic keratoses   . Olecranon bursitis      Past Surgical History  Procedure Laterality Date  . Tonsillectomy and adenoidectomy    . Cataract extraction--right eye  2013      Prior to Admission medications   Medication Sig Start Date End Date Taking? Authorizing Provider  aspirin 325 MG EC tablet Take 325 mg by mouth 2 (two) times daily.   Yes Historical Provider, MD  chlorthalidone (HYGROTON) 25 MG tablet Take 25 mg by mouth every Monday, Wednesday, and Friday.    Yes Historical Provider, MD  ezetimibe (ZETIA) 10 MG tablet Take 10 mg by mouth daily.   Yes Historical Provider, MD  loratadine (CLARITIN) 10 MG tablet Take 10 mg by mouth daily.   Yes Historical Provider, MD  omeprazole (PRILOSEC) 20 MG capsule Take 20 mg by mouth daily. 06/17/11  Yes Collene Gobble, MD  valsartan (DIOVAN) 320 MG tablet Take 320 mg by mouth at bedtime.    Yes Historical Provider, MD  diphenhydrAMINE (BENADRYL) 25 MG tablet Take 2 tablets (50 mg total)  by mouth every 6 (six) hours. Take 1-2 tablets every 6 hours x 2 days, then space out to an as needed basis 01/10/14   Alfonzo Beers, MD  oxyCODONE-acetaminophen (PERCOCET/ROXICET) 5-325 MG per tablet Take 1-2 tablets by mouth every 6 (six) hours as needed for severe pain. 01/10/14   Alfonzo Beers, MD  predniSONE (DELTASONE) 10 MG tablet Take 6 tablets (60 mg total) by mouth daily. Take 6, 5, 4, 3, 2, 1 tabs po qD x 3 days each 01/10/14   Alfonzo Beers, MD     Allergies  Allergen Reactions  . Nabumetone Other (See  Comments)    Gi upset    Social History:  reports that he has never smoked. He does not have any smokeless tobacco history on file. He reports that he drinks alcohol. He reports that he does not use illicit drugs.    Family History  Problem Relation Age of Onset  . Heart attack Paternal Grandfather   . Heart attack Maternal Grandfather   . Cancer Paternal Grandmother       Physical Exam:  GEN:  Pleasant Obese Elderly  81 y.o. Caucasian male examined and in no acute distress; cooperative with exam Filed Vitals:   12/20/14 2315 12/20/14 2330 12/20/14 2345 12/21/14 0000  BP: 139/100 143/103 166/111 137/101  Pulse: 98 93 116 93  Temp:      Resp: 17 19 20 19   Weight:      SpO2: 95% 94% 96% 95%   Blood pressure 137/101, pulse 93, temperature 98.2 F (36.8 C), resp. rate 19, weight 101.1 kg (222 lb 14.2 oz), SpO2 95 %. PSYCH: He is alert and oriented x4; does not appear anxious does not appear depressed; affect is normal HEENT: Normocephalic and Atraumatic, Mucous membranes pink; PERRLA; EOM intact; Fundi:  Benign;  No scleral icterus, Nares: Patent, Oropharynx: Clear, Fair Dentition,    Neck:  FROM, No Cervical Lymphadenopathy nor Thyromegaly or Carotid Bruit; No JVD; Breasts:: Not examined CHEST WALL: No tenderness CHEST: Normal respiration, clear to auscultation bilaterally HEART:  Irregular rate and rhythm; no murmurs rubs or gallops BACK: No kyphosis or scoliosis; No CVA tenderness ABDOMEN: Positive Bowel Sounds, Obese, Soft Non-Tender, No Rebound or Guarding; No Masses, No Organomegaly  Rectal Exam: Not done EXTREMITIES: No Cyanosis, Clubbing, or Edema; No Ulcerations. Genitalia: not examined PULSES: 2+ and symmetric SKIN: Normal hydration no rash or ulceration CNS:  Alert and Oriented x 4, No Focal Deficits  Mental Status:  Alert, Oriented, Thought Content Appropriate. Speech Fluent without evidence of Aphasia. Able to follow 3 step commands without difficulty.  In No  obvious pain.    Cranial Nerves:  II: Discs flat bilaterally; Visual fields Intact, Pupils equal and reactive.     III,IV, VI: Extra-ocular motions intact bilaterally     V,VII: smile symmetric, facial light touch sensation normal bilaterally     VIII: hearing intact bilaterally     IX,X: gag reflex present     XI: bilateral shoulder shrug     XII: midline tongue extension    Motor:  Right:  Upper extremity 5/5     Left:  Upper extremity 5/5      Right:  Lower extremity 5/5    Left:  Lower extremity 5/5      Tone and Bulk:  normal tone throughout; no atrophy noted    Sensory:  Pinprick and light touch intact throughout, bilaterally    Deep Tendon Reflexes: 2+ and symmetric throughout ;  Babinski:  Right:  Equivocal   Left: Equivocal    Cerebellar:  Finger to nose without difficulty.    Gait: deferred    Vascular: pulses palpable throughout    Labs on Admission:  Basic Metabolic Panel:  Recent Labs Lab 12/20/14 2105 12/20/14 2113  NA 138 138  K 3.5 3.5  CL 100* 99*  CO2 25  --   GLUCOSE 99 94  BUN 18 23*  CREATININE 1.20 1.20  CALCIUM 9.8  --    Liver Function Tests:  Recent Labs Lab 12/20/14 2105  AST 30  ALT 34  ALKPHOS 37*  BILITOT 0.8  PROT 7.4  ALBUMIN 4.6   No results for input(s): LIPASE, AMYLASE in the last 168 hours. No results for input(s): AMMONIA in the last 168 hours. CBC:  Recent Labs Lab 12/20/14 2105 12/20/14 2113  WBC 9.1  --   NEUTROABS 3.8  --   HGB 16.7 18.4*  HCT 48.2 54.0*  MCV 86.7  --   PLT 154  --    Cardiac Enzymes: No results for input(s): CKTOTAL, CKMB, CKMBINDEX, TROPONINI in the last 168 hours.  BNP (last 3 results) No results for input(s): BNP in the last 8760 hours.  ProBNP (last 3 results) No results for input(s): PROBNP in the last 8760 hours.  CBG: No results for input(s): GLUCAP in the last 168 hours.  Radiological Exams on Admission: Ct Head Wo Contrast  12/20/2014   CLINICAL DATA:  Code stroke.   Difficulty with speech.  EXAM: CT HEAD WITHOUT CONTRAST  TECHNIQUE: Contiguous axial images were obtained from the base of the skull through the vertex without intravenous contrast.  COMPARISON:  11/22/2013  FINDINGS: There is no evidence of mass effect, midline shift, or extra-axial fluid collections. There is no evidence of a space-occupying lesion or intracranial hemorrhage. There is left cerebellar low-attenuation concerning for a acute-subacute infarct. Small bilateral lacunar infarcts. There is generalized cerebral atrophy. There is periventricular white matter low attenuation likely secondary to microangiopathy.  The ventricles and sulci are appropriate for the patient's age. The basal cisterns are patent.  Visualized portions of the orbits are unremarkable. The visualized portions of the paranasal sinuses and mastoid air cells are unremarkable. Cerebrovascular atherosclerotic calcifications are noted.  The osseous structures are unremarkable.  IMPRESSION: 1. Acute versus subacute nonhemorrhagic left cerebellar infarct. These results were called by telephone at the time of interpretation on 12/20/2014 at 9:27 pm to [facility] , who verbally acknowledged these results.   Electronically Signed   By: Kathreen Devoid   On: 12/20/2014 21:28   Mr [name] Head Wo Contrast  12/20/2014   CLINICAL DATA:  Initial evaluation for acute aphasia.  EXAM: MRI HEAD WITHOUT CONTRAST  MRA HEAD WITHOUT CONTRAST  TECHNIQUE: Multiplanar, multiecho pulse sequences of the brain and surrounding structures were obtained without intravenous contrast. Angiographic images of the head were obtained using MRA technique without contrast.  COMPARISON:  Prior CT from earlier the same day.  FINDINGS: MRI HEAD FINDINGS  There is a small approximately 7 mm focus of restricted diffusion within the right aspect of the splenium (series 3, image 27 on axial sequence, series 4, image 11 on coronal sequence), suspicious for a small acute ischemic infarct.  Corresponding signal loss seen on ADC map. No other foci of restricted diffusion to suggest acute ischemic infarct. Gray-white matter differentiation otherwise maintained. Normal intravascular flow voids are preserved. No acute intracranial hemorrhage.  Diffuse prominence of the CSF containing spaces is compatible with generalized  cerebral atrophy. Patchy and confluent T2/FLAIR hyperintensity within the periventricular and deep white matter both cerebral hemispheres most consistent with chronic small vessel ischemic disease. Small remote lacunar infarcts within the bilateral basal ganglia/ corona radiata. Probable additional small remote lacunar infarct within the central pons with associated chronic micro hemorrhage.  No mass lesion, midline shift, or mass effect. Ventricular prominence related global parenchymal volume loss present without hydrocephalus. No extra-axial fluid collection.  Craniocervical junction within normal limits. Pituitary gland normal. No acute abnormality about the orbits. Mild mucosal thickening within the ethmoidal air cells and maxillary sinuses. Small right mastoid effusion noted. Inner ear structures normal. Bone marrow signal intensity within normal limits. No acute abnormality about the scalp soft tissues.  MRA HEAD FINDINGS  ANTERIOR CIRCULATION:  Visualized distal cervical segments of the internal carotid arteries are widely patent with antegrade flow. The petrous and cavernous segments are widely patent. Mild narrowing of the supra clinoid right ICA. Supraclinoid left ICA widely patent. Right A1 segment hypoplastic. Left A1 segment widely patent. Anterior communicating artery normal. Anterior cerebral arteries well opacified bilaterally.  M1 segments widely patent without stenosis or occlusion. MCA bifurcations normal. Distal MCA branches fairly symmetric bilaterally and well opacified.  POSTERIOR CIRCULATION:  Right vertebral artery is dominant and patent to the vertebrobasilar  junction. Left vertebral artery is diminutive and appears to terminate in PICA. There is a in somewhat fusiform aneurysm arising from the distal right vertebral artery near the vertebrobasilar junction that measures approximately 6 x 8 mm (series 5, image 33). This is distal to the take-off of the right posterior inferior cerebral artery. Basilar artery is somewhat diminutive but patent. Superior cerebellar arteries opacified bilaterally. There is fetal origin of the left PCA with a widely patent left posterior communicating artery. Right P1 segment is hypoplastic, with the right PCA supplied largely via the right posterior communicating artery. Posterior cerebral arteries are well opacified to their distal aspects.  IMPRESSION: MRI HEAD IMPRESSION:  1. Acute 7 mm ischemic nonhemorrhagic infarct involving the right splenium without mass effect. 2. Atrophy with chronic microvascular ischemic disease and multiple small remote lacunar infarcts as above.  MRA HEAD IMPRESSION:  1. No proximal or large arterial branch occlusion identified within the intracranial circulation. 2. No hemodynamically significant or correctable stenosis identified. 3. Diminutive vertebrobasilar system, with fetal origin of the left PCA and the right PCA supplied largely via a prominent right posterior communicating artery. 4. 8 x 6 mm fusiform aneurysm arising from the distal right vertebral artery/vertebrobasilar junction. 5. Diminutive left vertebral artery terminates in PICA.   Electronically Signed   By: Jeannine Boga M.D.   On: 12/20/2014 23:08   Mr Brain Wo Contrast  12/20/2014   CLINICAL DATA:  Initial evaluation for acute aphasia.  EXAM: MRI HEAD WITHOUT CONTRAST  MRA HEAD WITHOUT CONTRAST  TECHNIQUE: Multiplanar, multiecho pulse sequences of the brain and surrounding structures were obtained without intravenous contrast. Angiographic images of the head were obtained using MRA technique without contrast.  COMPARISON:  Prior  CT from earlier the same day.  FINDINGS: MRI HEAD FINDINGS  There is a small approximately 7 mm focus of restricted diffusion within the right aspect of the splenium (series 3, image 27 on axial sequence, series 4, image 11 on coronal sequence), suspicious for a small acute ischemic infarct. Corresponding signal loss seen on ADC map. No other foci of restricted diffusion to suggest acute ischemic infarct. Gray-white matter differentiation otherwise maintained. Normal intravascular flow voids are preserved. No  acute intracranial hemorrhage.  Diffuse prominence of the CSF containing spaces is compatible with generalized cerebral atrophy. Patchy and confluent T2/FLAIR hyperintensity within the periventricular and deep white matter both cerebral hemispheres most consistent with chronic small vessel ischemic disease. Small remote lacunar infarcts within the bilateral basal ganglia/ corona radiata. Probable additional small remote lacunar infarct within the central pons with associated chronic micro hemorrhage.  No mass lesion, midline shift, or mass effect. Ventricular prominence related global parenchymal volume loss present without hydrocephalus. No extra-axial fluid collection.  Craniocervical junction within normal limits. Pituitary gland normal. No acute abnormality about the orbits. Mild mucosal thickening within the ethmoidal air cells and maxillary sinuses. Small right mastoid effusion noted. Inner ear structures normal. Bone marrow signal intensity within normal limits. No acute abnormality about the scalp soft tissues.  MRA HEAD FINDINGS  ANTERIOR CIRCULATION:  Visualized distal cervical segments of the internal carotid arteries are widely patent with antegrade flow. The petrous and cavernous segments are widely patent. Mild narrowing of the supra clinoid right ICA. Supraclinoid left ICA widely patent. Right A1 segment hypoplastic. Left A1 segment widely patent. Anterior communicating artery normal. Anterior  cerebral arteries well opacified bilaterally.  M1 segments widely patent without stenosis or occlusion. MCA bifurcations normal. Distal MCA branches fairly symmetric bilaterally and well opacified.  POSTERIOR CIRCULATION:  Right vertebral artery is dominant and patent to the vertebrobasilar junction. Left vertebral artery is diminutive and appears to terminate in PICA. There is a in somewhat fusiform aneurysm arising from the distal right vertebral artery near the vertebrobasilar junction that measures approximately 6 x 8 mm (series 5, image 33). This is distal to the take-off of the right posterior inferior cerebral artery. Basilar artery is somewhat diminutive but patent. Superior cerebellar arteries opacified bilaterally. There is fetal origin of the left PCA with a widely patent left posterior communicating artery. Right P1 segment is hypoplastic, with the right PCA supplied largely via the right posterior communicating artery. Posterior cerebral arteries are well opacified to their distal aspects.  IMPRESSION: MRI HEAD IMPRESSION:  1. Acute 7 mm ischemic nonhemorrhagic infarct involving the right splenium without mass effect. 2. Atrophy with chronic microvascular ischemic disease and multiple small remote lacunar infarcts as above.  MRA HEAD IMPRESSION:  1. No proximal or large arterial branch occlusion identified within the intracranial circulation. 2. No hemodynamically significant or correctable stenosis identified. 3. Diminutive vertebrobasilar system, with fetal origin of the left PCA and the right PCA supplied largely via a prominent right posterior communicating artery. 4. 8 x 6 mm fusiform aneurysm arising from the distal right vertebral artery/vertebrobasilar junction. 5. Diminutive left vertebral artery terminates in PICA.   Electronically Signed   By: Jeannine Boga M.D.   On: 12/20/2014 23:08     EKG: Independently reviewed. Atrial fibrillation Rate =124    Assessment/Plan:      81  y.o. male with  Principal Problem:   1.    CVA (cerebral infarction)   CVA workup   Cardiac Monitoring   Neuro checks   ASA Rx   Neurolgy consuklted   Active Problems:    2.    Atrial fibrillation with RVR   Cardizem Drip   ASA Rx     3.    Hypertension   Continue Chlorthalidone and Valsartan Rx   Monitor BPs     4.    Hyperlipidemia   Continue Zetia Rx     5.    Aneurysm of vertebral artery   Known,  and seen on MRI/MRA of Brain     6.    DVT Prophylaxis   Lovenox     Code Status:     FULL CODE   Family Communication:   Wife at Bedside       Disposition Plan:    Inpatient Status       Time spent: 18 Minutes      Theressa Millard [facility] Pager [phone]   If [place] Please Contact the Day Rounding Team MD for [facility]  If 7PM-7AM, Please Contact Night-Floor Coverage  [email or web address] Password [email or web address] 12/21/2014, 12:31 AM     ADDENDUM:   Patient was seen and examined on 12/21/2014

## 2014-12-21 NOTE — Progress Notes (Signed)
*  PRELIMINARY RESULTS* Vascular Ultrasound Carotid Duplex (Doppler) has been completed.  Findings suggest 1-39% internal carotid artery stenosis bilaterally. Vertebral arteries are patent with antegrade flow.  12/21/2014 3:29 PM Maudry Mayhew, RVT, RDCS, RDMS

## 2014-12-21 NOTE — Progress Notes (Signed)
81 year old gentleman with h/o hypertension, hyperlipidemia, h/o CVA in the past , h/o vertebral artery aneurysm presents with acute onset language impairment. He was admitted earlier this am by Dr Arnoldo Morale and please see Dr Arnoldo Morale note for more details.  Stroke work up started and neurology was consulted. He will be started on eliquis as per neurology recommendations.    Steven Poisson, MD [phone]

## 2014-12-21 NOTE — Progress Notes (Signed)
ANTICOAGULATION CONSULT NOTE - Initial Consult  Pharmacy Consult for Eliquis Indication: Afib, CVA  Allergies  Allergen Reactions  . Nabumetone Other (See Comments)    Gi upset    Patient Measurements: Height: 6' (182.9 cm) Weight: 214 lb 15.2 oz (97.5 kg) IBW/kg (Calculated) : 77.6  Vital Signs: Temp: 97.4 F (36.3 C) (08/18 0800) Temp Source: Oral (08/18 0800) BP: 161/86 mmHg (08/18 0800) Pulse Rate: 86 (08/18 0800)  Labs:  Recent Labs  12/20/14 2105 12/20/14 2113  HGB 16.7 18.4*  HCT 48.2 54.0*  PLT 154  --   APTT 32  --   LABPROT 12.6  --   INR 0.93  --   CREATININE 1.20 1.20    Estimated Creatinine Clearance: 58.5 mL/min (by C-G formula based on Cr of 1.2).   Medical History: Past Medical History  Diagnosis Date  . Cough   . Lacunar stroke   . HTN (hypertension)   . Hypercholesteremia   . Aneurysm of vertebral artery   . Low back pain   . Rotator cuff tear   . History of mononucleosis   . Abdominal aortic atherosclerosis   . Actinic keratoses   . Olecranon bursitis    Assessment: 81 yo M presents on 8/17 with difficulty speaking. PMH includes a CVA 15 years ago, HTN, HLD, and aneruysm. CT showed a CVA but was not deemed a tPA candidate. Was also found to be in Afib in the ED. Pharmacy consulted to dose Eliquis for Afib / CVA. Age is 81 yo, weight 97 kg, and SCr ok at 1.2.  Goal of Therapy:  Monitor platelets by anticoagulation protocol: Yes   Plan:  Start apixaban 5mg  PO BID Monitor CBC, s/s of bleed   Lily Kernen J 12/21/2014,11:22 AM

## 2014-12-22 ENCOUNTER — Other Ambulatory Visit: Payer: Self-pay | Admitting: Physician Assistant

## 2014-12-22 DIAGNOSIS — I720 Aneurysm of carotid artery: Secondary | ICD-10-CM

## 2014-12-22 DIAGNOSIS — I4891 Unspecified atrial fibrillation: Secondary | ICD-10-CM

## 2014-12-22 DIAGNOSIS — I6309 Cerebral infarction due to thrombosis of other precerebral artery: Secondary | ICD-10-CM

## 2014-12-22 DIAGNOSIS — R931 Abnormal findings on diagnostic imaging of heart and coronary circulation: Secondary | ICD-10-CM

## 2014-12-22 DIAGNOSIS — I63431 Cerebral infarction due to embolism of right posterior cerebral artery: Secondary | ICD-10-CM

## 2014-12-22 DIAGNOSIS — I10 Essential (primary) hypertension: Secondary | ICD-10-CM

## 2014-12-22 DIAGNOSIS — I639 Cerebral infarction, unspecified: Secondary | ICD-10-CM

## 2014-12-22 DIAGNOSIS — E785 Hyperlipidemia, unspecified: Secondary | ICD-10-CM

## 2014-12-22 LAB — HEMOGLOBIN A1C
Hgb A1c MFr Bld: 6.3 % — ABNORMAL HIGH (ref 4.8–5.6)
Hgb A1c MFr Bld: 6.5 % — ABNORMAL HIGH (ref 4.8–5.6)
Mean Plasma Glucose: 134 mg/dL
Mean Plasma Glucose: 140 mg/dL

## 2014-12-22 MED ORDER — ROSUVASTATIN CALCIUM 10 MG PO TABS
10.0000 mg | ORAL_TABLET | Freq: Every day | ORAL | Status: DC
  Administered 2014-12-22: 10 mg via ORAL
  Filled 2014-12-22: qty 1

## 2014-12-22 MED ORDER — DILTIAZEM HCL ER COATED BEADS 180 MG PO CP24
180.0000 mg | ORAL_CAPSULE | Freq: Every day | ORAL | Status: DC
  Administered 2014-12-22: 180 mg via ORAL
  Filled 2014-12-22: qty 1

## 2014-12-22 NOTE — Evaluation (Signed)
Physical Therapy Evaluation/ Discharge Patient Details Name: DOMINGO FUSON MRN: [id number] DOB: 1931/10/08 Today's Date: 12/22/2014   History of Present Illness  Pt admitted with acute onset of language impairment/confusion secondary to ischemic R CVA.  Symptoms resolved in 3 hours per his report.  PMH: OSA, HTN, HLD, lacunar infarcts 15 years ago.  Clinical Impression  Mr. Yadav is a very pleasant and active gentleman. Educated for stroke risk factors as well as signs and symptoms of stroke (BE FAST) with extreme importance placed on getting assist to arrive at hospital and not driving himself. Pt with slight balance deficit with gait which is baseline and appropriate for age. Pt without need for assist with transfers or gait and asymptomatic with Afib with nonsustained rate 115-138 with gait. HR 89 supine in bed. All questions and education completed, pt at baseline without further need for physical therapy with pt, spouse and dgtr all aware and agreeable.     Follow Up Recommendations No PT follow up    Equipment Recommendations  None recommended by PT    Recommendations for Other Services       Precautions / Restrictions Precautions Precautions: None      Mobility  Bed Mobility Overal bed mobility: Modified Independent                Transfers Overall transfer level: Modified independent                  Ambulation/Gait Ambulation/Gait assistance: Independent Ambulation Distance (Feet): 400 Feet Assistive device: None Gait Pattern/deviations: WFL(Within Functional Limits);Trunk flexed   Gait velocity interpretation: at or above normal speed for age/gender    Stairs            Wheelchair Mobility    Modified Rankin (Stroke Patients Only) Modified Rankin (Stroke Patients Only) Pre-Morbid Rankin Score: No symptoms Modified Rankin: No symptoms     Balance Overall balance assessment: Needs assistance   Sitting balance-Leahy Scale: Normal        Standing balance-Leahy Scale: Good                               Pertinent Vitals/Pain Pain Assessment: No/denies pain    Home Living Family/patient expects to be discharged to:: Private residence Living Arrangements: Spouse/significant other Available Help at Discharge: Family;Available 24 hours/day Type of Home: House Home Access: Stairs to enter Entrance Stairs-Rails: None Entrance Stairs-Number of Steps: 2 Home Layout: Two level;Able to live on main level with bedroom/bathroom Home Equipment: None      Prior Function Level of Independence: Independent         Comments: works as an Forensic psychologist, likes to garden     Journalist, newspaper        Extremity/Trunk Assessment   Upper Extremity Assessment: Defer to OT evaluation           Lower Extremity Assessment: Overall WFL for tasks assessed (bil LE 5/5 all with sensation intact)      Cervical / Trunk Assessment: Other exceptions  Communication   Communication: No difficulties  Cognition Arousal/Alertness: Awake/alert Behavior During Therapy: WFL for tasks assessed/performed Overall Cognitive Status: Within Functional Limits for tasks assessed                      General Comments      Exercises        Assessment/Plan    PT Assessment Patent does not need any  further PT services  PT Diagnosis Other (comment) (pt admitted with CVA without residual deficits)   PT Problem List    PT Treatment Interventions     PT Goals (Current goals can be found in the Care Plan section) Acute Rehab PT Goals PT Goal Formulation: All assessment and education complete, DC therapy    Frequency     Barriers to discharge        Co-evaluation               End of Session   Activity Tolerance: Patient tolerated treatment well Patient left: in bed;with call bell/phone within reach;with family/visitor present Nurse Communication: Mobility status         Time: 0459-9774 PT Time  Calculation (min) (ACUTE ONLY): 24 min   Charges:   PT Evaluation $Initial PT Evaluation Tier I: 1 Procedure     PT G CodesMelford Aase 12/22/2014, 2:21 PM Elwyn Reach, [place]

## 2014-12-22 NOTE — Progress Notes (Signed)
Steven Meadows is a 81 y.o. male patient who transferred  from [facility] awake, alert  & orientated  X 3, Full Code, VSS - Blood pressure 133/79, pulse 63, temperature 98.2 F (36.8 C), temperature source Oral, resp. rate 18, height 6' (1.829 m), weight 97.16 kg (214 lb 3.2 oz), SpO2 100 %. on RA, no c/o shortness of breath, no c/o chest pain, no distress noted. Tele # [id number] placed and pt is currently running:atrial fibrillation, with ventricular rate of 90.   IV site WDL: wrist right, condition patent and no redness with a transparent dsg that's clean dry and intact.  Allergies:   Allergies  Allergen Reactions  . Nabumetone Other (See Comments)    Gi upset     Past Medical History  Diagnosis Date  . Cough   . Lacunar stroke   . HTN (hypertension)   . Hypercholesteremia   . Aneurysm of vertebral artery   . Low back pain   . Rotator cuff tear   . History of mononucleosis   . Abdominal aortic atherosclerosis   . Actinic keratoses   . Olecranon bursitis     Pt orientation to unit, room and routine. SR up x 2, fall risk assessment complete with Patient and family verbalizing understanding of risks associated with falls. Pt verbalizes an understanding of how to use the call bell and to call for help before getting out of bed.  Skin, clean-dry- intact without evidence of bruising, or skin tears.   No evidence of skin break down noted on exam. no wounds    Will cont to monitor and assist as needed.  Charlynn Grimes, RN 12/22/2014 8:00 PM

## 2014-12-22 NOTE — Progress Notes (Signed)
HR dropped to 47 unsustained, MD notified and telephone order placed to reduce rate to 2.11ml/hr. Will continue to monitor.

## 2014-12-22 NOTE — Progress Notes (Signed)
[facility] PROGRESS NOTE  Steven Meadows ZOX:[id number] DOB: Sep 11, 1931 DOA: 12/20/2014 PCP: Henrine Screws, MD  Assessment/Plan: 1. Acute 7 mm non hemorrhagic, ischemic infarct : Admitted to step down initially, and plan to transfer to telemetry later today.  MRA head does not sow any large arterial branch occlusion. Distal right VA aneurysm seen.  Carotid duplex showed bilateral 1 to 39% ICA stenosis. 2 D echocardiogram does not show any emboli. hgba1c is 6.5, LDL is 62.  The small stroke probably from atrial fibrillation.  Stopped aspirin and started on eliquis .   Atrial fibrillation: Rate not well controlled. On cardizem gtt.  Cardiology consulted and started on eliquis.    Right VA aneurysm: Follows with Dr Ellene Route as outpatient.  Recommend outpatient follow u on discharge.    Hypertension: Better controlled.   Hyperlipidemia: Resume zetia.  Code Status: full code.  Family Communication: daughter  at bedside.  Disposition Plan: pending.    Consultants: Neurology cardiology Procedures:  MRI brain  Echocardiogram  Carotid duplex.   Antibiotics:  none  HPI/Subjective: Feeling better. wokred with OT. DISCUSSED WITH with daughter at bedside.   Objective: Filed Vitals:   12/22/14 0712  BP: 147/94  Pulse: 71  Temp: 97.7 F (36.5 C)  Resp: 11    Intake/Output Summary (Last 24 hours) at 12/22/14 0934 Last data filed at 12/22/14 0800  Gross per 24 hour  Intake 353.25 ml  Output   1075 ml  Net -721.75 ml   Filed Weights   12/20/14 2125 12/21/14 0050  Weight: 101.1 kg (222 lb 14.2 oz) 97.5 kg (214 lb 15.2 oz)    Exam:   General:  Alert afebrile comfortable  Cardiovascular: s1s2  Respiratory: ctab  Abdomen: soft obese NT ND BS+  Musculoskeletal: no pedal edema.   Data Reviewed: Basic Metabolic Panel:  Recent Labs Lab 12/20/14 2105 12/20/14 2113  NA 138 138  K 3.5 3.5  CL 100* 99*  CO2 25  --   GLUCOSE 99 94  BUN 18  23*  CREATININE 1.20 1.20  CALCIUM 9.8  --    Liver Function Tests:  Recent Labs Lab 12/20/14 2105  AST 30  ALT 34  ALKPHOS 37*  BILITOT 0.8  PROT 7.4  ALBUMIN 4.6   No results for input(s): LIPASE, AMYLASE in the last 168 hours. No results for input(s): AMMONIA in the last 168 hours. CBC:  Recent Labs Lab 12/20/14 2105 12/20/14 2113  WBC 9.1  --   NEUTROABS 3.8  --   HGB 16.7 18.4*  HCT 48.2 54.0*  MCV 86.7  --   PLT 154  --    Cardiac Enzymes: No results for input(s): CKTOTAL, CKMB, CKMBINDEX, TROPONINI in the last 168 hours. BNP (last 3 results) No results for input(s): BNP in the last 8760 hours.  ProBNP (last 3 results) No results for input(s): PROBNP in the last 8760 hours.  CBG: No results for input(s): GLUCAP in the last 168 hours.  Recent Results (from the past 240 hour(s))  MRSA PCR Screening     Status: None   Collection Time: 12/21/14 12:51 AM  Result Value Ref Range Status   MRSA by PCR NEGATIVE NEGATIVE Final    Comment:        The GeneXpert MRSA Assay (FDA approved for NASAL specimens only), is one component of a comprehensive MRSA colonization surveillance program. It is not intended to diagnose MRSA infection nor to guide or monitor treatment for MRSA infections.  Studies: Ct Head Wo Contrast  12/20/2014   CLINICAL DATA:  Code stroke.  Difficulty with speech.  EXAM: CT HEAD WITHOUT CONTRAST  TECHNIQUE: Contiguous axial images were obtained from the base of the skull through the vertex without intravenous contrast.  COMPARISON:  11/22/2013  FINDINGS: There is no evidence of mass effect, midline shift, or extra-axial fluid collections. There is no evidence of a space-occupying lesion or intracranial hemorrhage. There is left cerebellar low-attenuation concerning for a acute-subacute infarct. Small bilateral lacunar infarcts. There is generalized cerebral atrophy. There is periventricular white matter low attenuation likely secondary to  microangiopathy.  The ventricles and sulci are appropriate for the patient's age. The basal cisterns are patent.  Visualized portions of the orbits are unremarkable. The visualized portions of the paranasal sinuses and mastoid air cells are unremarkable. Cerebrovascular atherosclerotic calcifications are noted.  The osseous structures are unremarkable.  IMPRESSION: 1. Acute versus subacute nonhemorrhagic left cerebellar infarct. These results were called by telephone at the time of interpretation on 12/20/2014 at 9:27 pm to [facility] , who verbally acknowledged these results.   Electronically Signed   By: Kathreen Devoid   On: 12/20/2014 21:28   Mr [name] Head Wo Contrast  12/20/2014   CLINICAL DATA:  Initial evaluation for acute aphasia.  EXAM: MRI HEAD WITHOUT CONTRAST  MRA HEAD WITHOUT CONTRAST  TECHNIQUE: Multiplanar, multiecho pulse sequences of the brain and surrounding structures were obtained without intravenous contrast. Angiographic images of the head were obtained using MRA technique without contrast.  COMPARISON:  Prior CT from earlier the same day.  FINDINGS: MRI HEAD FINDINGS  There is a small approximately 7 mm focus of restricted diffusion within the right aspect of the splenium (series 3, image 27 on axial sequence, series 4, image 11 on coronal sequence), suspicious for a small acute ischemic infarct. Corresponding signal loss seen on ADC map. No other foci of restricted diffusion to suggest acute ischemic infarct. Gray-white matter differentiation otherwise maintained. Normal intravascular flow voids are preserved. No acute intracranial hemorrhage.  Diffuse prominence of the CSF containing spaces is compatible with generalized cerebral atrophy. Patchy and confluent T2/FLAIR hyperintensity within the periventricular and deep white matter both cerebral hemispheres most consistent with chronic small vessel ischemic disease. Small remote lacunar infarcts within the bilateral basal ganglia/ corona  radiata. Probable additional small remote lacunar infarct within the central pons with associated chronic micro hemorrhage.  No mass lesion, midline shift, or mass effect. Ventricular prominence related global parenchymal volume loss present without hydrocephalus. No extra-axial fluid collection.  Craniocervical junction within normal limits. Pituitary gland normal. No acute abnormality about the orbits. Mild mucosal thickening within the ethmoidal air cells and maxillary sinuses. Small right mastoid effusion noted. Inner ear structures normal. Bone marrow signal intensity within normal limits. No acute abnormality about the scalp soft tissues.  MRA HEAD FINDINGS  ANTERIOR CIRCULATION:  Visualized distal cervical segments of the internal carotid arteries are widely patent with antegrade flow. The petrous and cavernous segments are widely patent. Mild narrowing of the supra clinoid right ICA. Supraclinoid left ICA widely patent. Right A1 segment hypoplastic. Left A1 segment widely patent. Anterior communicating artery normal. Anterior cerebral arteries well opacified bilaterally.  M1 segments widely patent without stenosis or occlusion. MCA bifurcations normal. Distal MCA branches fairly symmetric bilaterally and well opacified.  POSTERIOR CIRCULATION:  Right vertebral artery is dominant and patent to the vertebrobasilar junction. Left vertebral artery is diminutive and appears to terminate in PICA. There is a in somewhat  fusiform aneurysm arising from the distal right vertebral artery near the vertebrobasilar junction that measures approximately 6 x 8 mm (series 5, image 33). This is distal to the take-off of the right posterior inferior cerebral artery. Basilar artery is somewhat diminutive but patent. Superior cerebellar arteries opacified bilaterally. There is fetal origin of the left PCA with a widely patent left posterior communicating artery. Right P1 segment is hypoplastic, with the right PCA supplied largely  via the right posterior communicating artery. Posterior cerebral arteries are well opacified to their distal aspects.  IMPRESSION: MRI HEAD IMPRESSION:  1. Acute 7 mm ischemic nonhemorrhagic infarct involving the right splenium without mass effect. 2. Atrophy with chronic microvascular ischemic disease and multiple small remote lacunar infarcts as above.  MRA HEAD IMPRESSION:  1. No proximal or large arterial branch occlusion identified within the intracranial circulation. 2. No hemodynamically significant or correctable stenosis identified. 3. Diminutive vertebrobasilar system, with fetal origin of the left PCA and the right PCA supplied largely via a prominent right posterior communicating artery. 4. 8 x 6 mm fusiform aneurysm arising from the distal right vertebral artery/vertebrobasilar junction. 5. Diminutive left vertebral artery terminates in PICA.   Electronically Signed   By: Jeannine Boga M.D.   On: 12/20/2014 23:08   Mr Brain Wo Contrast  12/20/2014   CLINICAL DATA:  Initial evaluation for acute aphasia.  EXAM: MRI HEAD WITHOUT CONTRAST  MRA HEAD WITHOUT CONTRAST  TECHNIQUE: Multiplanar, multiecho pulse sequences of the brain and surrounding structures were obtained without intravenous contrast. Angiographic images of the head were obtained using MRA technique without contrast.  COMPARISON:  Prior CT from earlier the same day.  FINDINGS: MRI HEAD FINDINGS  There is a small approximately 7 mm focus of restricted diffusion within the right aspect of the splenium (series 3, image 27 on axial sequence, series 4, image 11 on coronal sequence), suspicious for a small acute ischemic infarct. Corresponding signal loss seen on ADC map. No other foci of restricted diffusion to suggest acute ischemic infarct. Gray-white matter differentiation otherwise maintained. Normal intravascular flow voids are preserved. No acute intracranial hemorrhage.  Diffuse prominence of the CSF containing spaces is compatible  with generalized cerebral atrophy. Patchy and confluent T2/FLAIR hyperintensity within the periventricular and deep white matter both cerebral hemispheres most consistent with chronic small vessel ischemic disease. Small remote lacunar infarcts within the bilateral basal ganglia/ corona radiata. Probable additional small remote lacunar infarct within the central pons with associated chronic micro hemorrhage.  No mass lesion, midline shift, or mass effect. Ventricular prominence related global parenchymal volume loss present without hydrocephalus. No extra-axial fluid collection.  Craniocervical junction within normal limits. Pituitary gland normal. No acute abnormality about the orbits. Mild mucosal thickening within the ethmoidal air cells and maxillary sinuses. Small right mastoid effusion noted. Inner ear structures normal. Bone marrow signal intensity within normal limits. No acute abnormality about the scalp soft tissues.  MRA HEAD FINDINGS  ANTERIOR CIRCULATION:  Visualized distal cervical segments of the internal carotid arteries are widely patent with antegrade flow. The petrous and cavernous segments are widely patent. Mild narrowing of the supra clinoid right ICA. Supraclinoid left ICA widely patent. Right A1 segment hypoplastic. Left A1 segment widely patent. Anterior communicating artery normal. Anterior cerebral arteries well opacified bilaterally.  M1 segments widely patent without stenosis or occlusion. MCA bifurcations normal. Distal MCA branches fairly symmetric bilaterally and well opacified.  POSTERIOR CIRCULATION:  Right vertebral artery is dominant and patent to the vertebrobasilar junction. Left  vertebral artery is diminutive and appears to terminate in PICA. There is a in somewhat fusiform aneurysm arising from the distal right vertebral artery near the vertebrobasilar junction that measures approximately 6 x 8 mm (series 5, image 33). This is distal to the take-off of the right posterior  inferior cerebral artery. Basilar artery is somewhat diminutive but patent. Superior cerebellar arteries opacified bilaterally. There is fetal origin of the left PCA with a widely patent left posterior communicating artery. Right P1 segment is hypoplastic, with the right PCA supplied largely via the right posterior communicating artery. Posterior cerebral arteries are well opacified to their distal aspects.  IMPRESSION: MRI HEAD IMPRESSION:  1. Acute 7 mm ischemic nonhemorrhagic infarct involving the right splenium without mass effect. 2. Atrophy with chronic microvascular ischemic disease and multiple small remote lacunar infarcts as above.  MRA HEAD IMPRESSION:  1. No proximal or large arterial branch occlusion identified within the intracranial circulation. 2. No hemodynamically significant or correctable stenosis identified. 3. Diminutive vertebrobasilar system, with fetal origin of the left PCA and the right PCA supplied largely via a prominent right posterior communicating artery. 4. 8 x 6 mm fusiform aneurysm arising from the distal right vertebral artery/vertebrobasilar junction. 5. Diminutive left vertebral artery terminates in PICA.   Electronically Signed   By: Jeannine Boga M.D.   On: 12/20/2014 23:08    Scheduled Meds: . apixaban  5 mg Oral BID  . ezetimibe  10 mg Oral Daily  . irbesartan  300 mg Oral Daily  . loratadine  10 mg Oral Daily  . pantoprazole  40 mg Oral Daily   Continuous Infusions: . diltiazem (CARDIZEM) infusion 2.5 mg/hr (12/22/14 0118)    Principal Problem:   CVA (cerebral infarction) Active Problems:   Hypertension   Hyperlipidemia   Atrial fibrillation with RVR   Aneurysm of vertebral artery   Stroke with cerebral ischemia    Time spent: 30 minutes    [facility] Pager [phone] If 7PM-7AM, please contact night-coverage at [email or web address], password [facility] 12/22/2014, 9:34 AM  LOS: 2 days

## 2014-12-22 NOTE — Evaluation (Signed)
Occupational Therapy Evaluation Patient Details Name: Steven Meadows MRN: [id number] DOB: [birth year] Today's Date: 12/22/2014    History of Present Illness Pt admitted with acute onset of language impairment/confusion secondary to ischemic R CVA.  Symptoms resolved in 3 hours per his report.  PMH: OSA, HTN, HLD, lacunar infarcts 15 years ago.   Clinical Impression   Pt is performing ADL and ADL transfers at a modified independent level.  Supervision with ambulation due to lines and pt with non sustained HR to 155 while performing standing ADL. Pt asymptomatic. No further OT needs.   Follow Up Recommendations  No OT follow up    Equipment Recommendations  None recommended by OT    Recommendations for Other Services       Precautions / Restrictions        Mobility Bed Mobility Overal bed mobility: Modified Independent             General bed mobility comments: HOB up, extra time  Transfers Overall transfer level: Modified independent Equipment used: None Transfers: Sit to/from Stand Sit to Stand: Modified independent (Device/Increase time)         General transfer comment: increased effort, pt had not been OOB since Wednesday    Balance                                            ADL Overall ADL's : Modified independent                                       General ADL Comments: Pt demonstrated standing toileting, donning socks and grooming at sink at a modified independent level     Vision     Perception     Praxis      Pertinent Vitals/Pain Pain Assessment: No/denies pain     Hand Dominance Right   Extremity/Trunk Assessment Upper Extremity Assessment Upper Extremity Assessment: Overall WFL for tasks assessed   Lower Extremity Assessment Lower Extremity Assessment: Defer to PT evaluation       Communication Communication Communication: HOH   Cognition Arousal/Alertness: Awake/alert Behavior During  Therapy: WFL for tasks assessed/performed Overall Cognitive Status: Within Functional Limits for tasks assessed                     General Comments       Exercises       Shoulder Instructions      Home Living Family/patient expects to be discharged to:: Private residence Living Arrangements: Spouse/significant other Available Help at Discharge: Family;Available 24 hours/day Type of Home: House Home Access: Stairs to enter [employer] of Steps: 2 Entrance Stairs-Rails: None Home Layout: Two level;Able to live on main level with bedroom/bathroom     Bathroom Shower/Tub: Occupational psychologist: Handicapped height     Home Equipment: None          Prior Functioning/Environment Level of Independence: Independent        Comments: works as an Forensic psychologist, likes to garden    OT Diagnosis:     OT Problem List:     OT Treatment/Interventions:      OT Goals(Current goals can be found in the care plan section)    OT Frequency:     Barriers to D/C:  Co-evaluation              End of Session Equipment Utilized During Treatment: Gait belt  Activity Tolerance: Treatment limited secondary to medical complications (Comment) (HR to 155 with standing toileting) Patient left: in chair;with call bell/phone within reach;with family/visitor present   Time: 9480-1655 OT Time Calculation (min): 46 min Charges:  OT General Charges $OT Visit: 1 Procedure OT Evaluation $Initial OT Evaluation Tier I: 1 Procedure OT Treatments $Self Care/Home Management : 23-37 mins G-Codes:    Malka So 12/22/2014, 9:55 AM  [phone]

## 2014-12-22 NOTE — Consult Note (Addendum)
CARDIOLOGY CONSULT NOTE   Patient ID: MAISON KESTENBAUM MRN: [id number] DOB/AGE: August 17, 1931 81 y.o.  Admit date: 12/20/2014  Primary Physician   Henrine Screws, MD Primary Cardiologist   New Reason for Consultation   Afib with RVR  HPI: KIAN OTTAVIANO is a 81 y.o. male with a history of HTN, HL, cerbebral artery aneursym, remote CVA 15 years ago presented to [facility] ED 12/20/14 to [facility] ED with complaints of sudden onset of difficulty speaking at 7 pm while he was at home working on his taxes.   No other cardiac history. He was unable to get any words out and trouble with concentrating.He drove himself to the ED as these symptoms were similar to his previous stroke 15 years ago. CT scan of the Head and an MRI of the Brain were performed and revealed a positive acute CVA. He was not deemed a TPA candidate. He was also found to be in Atrial fibrillation at the time, and he denies any previous diagnosis of Atrial Fibrillation.Patient takes aspirin 325 mg BID.  Overall he feels his condition is completely resolved now. He speak as normal now. She was started on eliquis 5mg  BID for stroke prevention. Aspirin discontinued. 2D Echo EF 50-55%, akinesis of the basalinferior myocardium, mild dilated LA, no cardiac source of emboli.   Currently on Iv dil at rate of 2.26ml/hr and his rate in 90-120s. Elevated with activity. Denies any recent chest pain, sob, LE edema, orthopnea, PND, melena or bright red blood per rectum. For the past 1-2 he has been feeling palpitations. Recently had cleaned his teeth at dentist office, but no procedure. He drinks 2 cups of coffee every morning and 1 caffieenated soda in afternoon.    Past Medical History  Diagnosis Date  . Cough   . Lacunar stroke   . HTN (hypertension)   . Hypercholesteremia   . Aneurysm of vertebral artery   . Low back pain   . Rotator cuff tear   . History of mononucleosis   . Abdominal aortic atherosclerosis   . Actinic keratoses   .  Olecranon bursitis      Past Surgical History  Procedure Laterality Date  . Tonsillectomy and adenoidectomy    . Cataract extraction--right eye  2013    Allergies  Allergen Reactions  . Nabumetone Other (See Comments)    Gi upset    I have reviewed the patient's current medications . apixaban  5 mg Oral BID  . ezetimibe  10 mg Oral Daily  . irbesartan  300 mg Oral Daily  . loratadine  10 mg Oral Daily  . pantoprazole  40 mg Oral Daily   . diltiazem (CARDIZEM) infusion 2.5 mg/hr (12/22/14 0118)   hydrALAZINE, senna-docusate  Prior to Admission medications   Medication Sig Start Date End Date Taking? Authorizing Provider  aspirin 325 MG EC tablet Take 325 mg by mouth 2 (two) times daily.   Yes Historical Provider, MD  chlorthalidone (HYGROTON) 25 MG tablet Take 25 mg by mouth every Monday, Wednesday, and Friday.    Yes Historical Provider, MD  ezetimibe (ZETIA) 10 MG tablet Take 10 mg by mouth daily.   Yes Historical Provider, MD  loratadine (CLARITIN) 10 MG tablet Take 10 mg by mouth daily.   Yes Historical Provider, MD  omeprazole (PRILOSEC) 20 MG capsule Take 20 mg by mouth daily. 06/17/11  Yes Collene Gobble, MD  valsartan (DIOVAN) 320 MG tablet Take 320 mg by mouth at bedtime.  Yes Historical Provider, MD  diphenhydrAMINE (BENADRYL) 25 MG tablet Take 2 tablets (50 mg total) by mouth every 6 (six) hours. Take 1-2 tablets every 6 hours x 2 days, then space out to an as needed basis 01/10/14   Alfonzo Beers, MD  oxyCODONE-acetaminophen (PERCOCET/ROXICET) 5-325 MG per tablet Take 1-2 tablets by mouth every 6 (six) hours as needed for severe pain. 01/10/14   Alfonzo Beers, MD  predniSONE (DELTASONE) 10 MG tablet Take 6 tablets (60 mg total) by mouth daily. Take 6, 5, 4, 3, 2, 1 tabs po qD x 3 days each 01/10/14   Alfonzo Beers, MD     Social History   Social History  . Marital Status: Married    Spouse Name: N/A  . Number of Children: N/A  . Years of Education: N/A    Occupational History  . Not on file.   Social History Main Topics  . Smoking status: Never Smoker   . Smokeless tobacco: Not on file  . Alcohol Use: Yes     Comment: 2 drinks per night  . Drug Use: No  . Sexual Activity: Not on file   Other Topics Concern  . Not on file   Social History Narrative    No family status information on file.   Family History  Problem Relation Age of Onset  . Heart attack Paternal Grandfather   . Heart attack Maternal Grandfather   . Cancer Paternal Grandmother      ROS:  Full 14 point review of systems complete and found to be negative unless listed above.  Physical Exam: Blood pressure 115/91, pulse 98, temperature 97.6 F (36.4 C), temperature source Oral, resp. rate 12, height 6' (1.829 m), weight 214 lb 15.2 oz (97.5 kg), SpO2 96 %.  General: Well developed, well nourished, male in no acute distress Head: Eyes PERRLA, No xanthomas. Normocephalic and atraumatic, oropharynx without edema or exudate.  Lungs: Resp regular and unlabored, CTA. Heart: Ir Ir  no s3, s4, or murmurs..   Neck: No carotid bruits. No lymphadenopathy.  JVD. Abdomen: Bowel sounds present, abdomen soft and non-tender without masses or hernias noted. Msk:  No spine or cva tenderness. No weakness, no joint deformities or effusions. Extremities: No clubbing, cyanosis or edema. DP/PT/Radials 2+ and equal bilaterally. Neuro: Alert and oriented X 3. No focal deficits noted. Psych:  Good affect, responds appropriately Skin: No rashes or lesions noted.  Labs:   Lab Results  Component Value Date   WBC 9.1 12/20/2014   HGB 18.4* 12/20/2014   HCT 54.0* 12/20/2014   MCV 86.7 12/20/2014   PLT 154 12/20/2014    Recent Labs  12/20/14 2105  INR 0.93    Recent Labs Lab 12/20/14 2105 12/20/14 2113  NA 138 138  K 3.5 3.5  CL 100* 99*  CO2 25  --   BUN 18 23*  CREATININE 1.20 1.20  CALCIUM 9.8  --   PROT 7.4  --   BILITOT 0.8  --   ALKPHOS 37*  --   ALT 34  --    AST 30  --   GLUCOSE 99 94  ALBUMIN 4.6  --     Recent Labs  12/20/14 2110  TROPIPOC 0.00   No results found for: PROBNP Lab Results  Component Value Date   CHOL 126 12/21/2014   HDL 49 12/21/2014   LDLCALC 62 12/21/2014   TRIG 75 12/21/2014    Echo: 12/21/14 LV EF: 50% -  55%  -------------------------------------------------------------------  Indications:   CVA 11.  ------------------------------------------------------------------- History:  PMH: Vertebral Artery Aneurysm. Stroke. Risk factors: Hypertension. Dyslipidemia.  ------------------------------------------------------------------- Study Conclusions  - Left ventricle: The cavity size was normal. There was mild concentric hypertrophy. Systolic function was normal. The estimated ejection fraction was in the range of 50% to 55%. There is akinesis of the basalinferior myocardium. There was no evidence of elevated ventricular filling pressure by Doppler parameters. - Left atrium: The atrium was mildly dilated.  Impressions:  - No cardiac source of emboli was indentified.   ECG:   Vent. rate 124 BPM PR interval * ms QRS duration 103 ms QT/QTc 355/510 ms P-R-T axes -1 12 45  Radiology:  Ct Head Wo Contrast  12/20/2014   CLINICAL DATA:  Code stroke.  Difficulty with speech.  EXAM: CT HEAD WITHOUT CONTRAST  TECHNIQUE: Contiguous axial images were obtained from the base of the skull through the vertex without intravenous contrast.  COMPARISON:  11/22/2013  FINDINGS: There is no evidence of mass effect, midline shift, or extra-axial fluid collections. There is no evidence of a space-occupying lesion or intracranial hemorrhage. There is left cerebellar low-attenuation concerning for a acute-subacute infarct. Small bilateral lacunar infarcts. There is generalized cerebral atrophy. There is periventricular white matter low attenuation likely secondary to microangiopathy.  The ventricles and sulci  are appropriate for the patient's age. The basal cisterns are patent.  Visualized portions of the orbits are unremarkable. The visualized portions of the paranasal sinuses and mastoid air cells are unremarkable. Cerebrovascular atherosclerotic calcifications are noted.  The osseous structures are unremarkable.  IMPRESSION: 1. Acute versus subacute nonhemorrhagic left cerebellar infarct. These results were called by telephone at the time of interpretation on 12/20/2014 at 9:27 pm to [facility] , who verbally acknowledged these results.   Electronically Signed   By: Kathreen Devoid   On: 12/20/2014 21:28   Mr [name] Head Wo Contrast  12/20/2014   CLINICAL DATA:  Initial evaluation for acute aphasia.  EXAM: MRI HEAD WITHOUT CONTRAST  MRA HEAD WITHOUT CONTRAST  TECHNIQUE: Multiplanar, multiecho pulse sequences of the brain and surrounding structures were obtained without intravenous contrast. Angiographic images of the head were obtained using MRA technique without contrast.  COMPARISON:  Prior CT from earlier the same day.  FINDINGS: MRI HEAD FINDINGS  There is a small approximately 7 mm focus of restricted diffusion within the right aspect of the splenium (series 3, image 27 on axial sequence, series 4, image 11 on coronal sequence), suspicious for a small acute ischemic infarct. Corresponding signal loss seen on ADC map. No other foci of restricted diffusion to suggest acute ischemic infarct. Gray-white matter differentiation otherwise maintained. Normal intravascular flow voids are preserved. No acute intracranial hemorrhage.  Diffuse prominence of the CSF containing spaces is compatible with generalized cerebral atrophy. Patchy and confluent T2/FLAIR hyperintensity within the periventricular and deep white matter both cerebral hemispheres most consistent with chronic small vessel ischemic disease. Small remote lacunar infarcts within the bilateral basal ganglia/ corona radiata. Probable additional small remote lacunar  infarct within the central pons with associated chronic micro hemorrhage.  No mass lesion, midline shift, or mass effect. Ventricular prominence related global parenchymal volume loss present without hydrocephalus. No extra-axial fluid collection.  Craniocervical junction within normal limits. Pituitary gland normal. No acute abnormality about the orbits. Mild mucosal thickening within the ethmoidal air cells and maxillary sinuses. Small right mastoid effusion noted. Inner ear structures normal. Bone marrow signal intensity within normal limits. No acute abnormality about the scalp  soft tissues.  MRA HEAD FINDINGS  ANTERIOR CIRCULATION:  Visualized distal cervical segments of the internal carotid arteries are widely patent with antegrade flow. The petrous and cavernous segments are widely patent. Mild narrowing of the supra clinoid right ICA. Supraclinoid left ICA widely patent. Right A1 segment hypoplastic. Left A1 segment widely patent. Anterior communicating artery normal. Anterior cerebral arteries well opacified bilaterally.  M1 segments widely patent without stenosis or occlusion. MCA bifurcations normal. Distal MCA branches fairly symmetric bilaterally and well opacified.  POSTERIOR CIRCULATION:  Right vertebral artery is dominant and patent to the vertebrobasilar junction. Left vertebral artery is diminutive and appears to terminate in PICA. There is a in somewhat fusiform aneurysm arising from the distal right vertebral artery near the vertebrobasilar junction that measures approximately 6 x 8 mm (series 5, image 33). This is distal to the take-off of the right posterior inferior cerebral artery. Basilar artery is somewhat diminutive but patent. Superior cerebellar arteries opacified bilaterally. There is fetal origin of the left PCA with a widely patent left posterior communicating artery. Right P1 segment is hypoplastic, with the right PCA supplied largely via the right posterior communicating artery.  Posterior cerebral arteries are well opacified to their distal aspects.  IMPRESSION: MRI HEAD IMPRESSION:  1. Acute 7 mm ischemic nonhemorrhagic infarct involving the right splenium without mass effect. 2. Atrophy with chronic microvascular ischemic disease and multiple small remote lacunar infarcts as above.  MRA HEAD IMPRESSION:  1. No proximal or large arterial branch occlusion identified within the intracranial circulation. 2. No hemodynamically significant or correctable stenosis identified. 3. Diminutive vertebrobasilar system, with fetal origin of the left PCA and the right PCA supplied largely via a prominent right posterior communicating artery. 4. 8 x 6 mm fusiform aneurysm arising from the distal right vertebral artery/vertebrobasilar junction. 5. Diminutive left vertebral artery terminates in PICA.   Electronically Signed   By: Jeannine Boga M.D.   On: 12/20/2014 23:08   Mr Brain Wo Contrast  12/20/2014   CLINICAL DATA:  Initial evaluation for acute aphasia.  EXAM: MRI HEAD WITHOUT CONTRAST  MRA HEAD WITHOUT CONTRAST  TECHNIQUE: Multiplanar, multiecho pulse sequences of the brain and surrounding structures were obtained without intravenous contrast. Angiographic images of the head were obtained using MRA technique without contrast.  COMPARISON:  Prior CT from earlier the same day.  FINDINGS: MRI HEAD FINDINGS  There is a small approximately 7 mm focus of restricted diffusion within the right aspect of the splenium (series 3, image 27 on axial sequence, series 4, image 11 on coronal sequence), suspicious for a small acute ischemic infarct. Corresponding signal loss seen on ADC map. No other foci of restricted diffusion to suggest acute ischemic infarct. Gray-white matter differentiation otherwise maintained. Normal intravascular flow voids are preserved. No acute intracranial hemorrhage.  Diffuse prominence of the CSF containing spaces is compatible with generalized cerebral atrophy. Patchy and  confluent T2/FLAIR hyperintensity within the periventricular and deep white matter both cerebral hemispheres most consistent with chronic small vessel ischemic disease. Small remote lacunar infarcts within the bilateral basal ganglia/ corona radiata. Probable additional small remote lacunar infarct within the central pons with associated chronic micro hemorrhage.  No mass lesion, midline shift, or mass effect. Ventricular prominence related global parenchymal volume loss present without hydrocephalus. No extra-axial fluid collection.  Craniocervical junction within normal limits. Pituitary gland normal. No acute abnormality about the orbits. Mild mucosal thickening within the ethmoidal air cells and maxillary sinuses. Small right mastoid effusion noted. Inner ear  structures normal. Bone marrow signal intensity within normal limits. No acute abnormality about the scalp soft tissues.  MRA HEAD FINDINGS  ANTERIOR CIRCULATION:  Visualized distal cervical segments of the internal carotid arteries are widely patent with antegrade flow. The petrous and cavernous segments are widely patent. Mild narrowing of the supra clinoid right ICA. Supraclinoid left ICA widely patent. Right A1 segment hypoplastic. Left A1 segment widely patent. Anterior communicating artery normal. Anterior cerebral arteries well opacified bilaterally.  M1 segments widely patent without stenosis or occlusion. MCA bifurcations normal. Distal MCA branches fairly symmetric bilaterally and well opacified.  POSTERIOR CIRCULATION:  Right vertebral artery is dominant and patent to the vertebrobasilar junction. Left vertebral artery is diminutive and appears to terminate in PICA. There is a in somewhat fusiform aneurysm arising from the distal right vertebral artery near the vertebrobasilar junction that measures approximately 6 x 8 mm (series 5, image 33). This is distal to the take-off of the right posterior inferior cerebral artery. Basilar artery is  somewhat diminutive but patent. Superior cerebellar arteries opacified bilaterally. There is fetal origin of the left PCA with a widely patent left posterior communicating artery. Right P1 segment is hypoplastic, with the right PCA supplied largely via the right posterior communicating artery. Posterior cerebral arteries are well opacified to their distal aspects.  IMPRESSION: MRI HEAD IMPRESSION:  1. Acute 7 mm ischemic nonhemorrhagic infarct involving the right splenium without mass effect. 2. Atrophy with chronic microvascular ischemic disease and multiple small remote lacunar infarcts as above.  MRA HEAD IMPRESSION:  1. No proximal or large arterial branch occlusion identified within the intracranial circulation. 2. No hemodynamically significant or correctable stenosis identified. 3. Diminutive vertebrobasilar system, with fetal origin of the left PCA and the right PCA supplied largely via a prominent right posterior communicating artery. 4. 8 x 6 mm fusiform aneurysm arising from the distal right vertebral artery/vertebrobasilar junction. 5. Diminutive left vertebral artery terminates in PICA.   Electronically Signed   By: Jeannine Boga M.D.   On: 12/20/2014 23:08    ASSESSMENT AND PLAN:     1. New onset Afib with RVR - CHADSVASc score of 6 (age, HTN, stroke, vascular disease) - Currently on IV dilt (2.36ml/hr) at rate of 90-110s. Rate 115-138 with gait during PT. Rate improved after titration of IV dilt to 63ml/hr. Will switch to cardizem CD 180mg .  - Continue Eliquis 5mg  BID.  - 2D Echo EF 50-55%, akinesis of the basalinferior myocardium, mild dilated LA, no cardiac source of emboli. ? Inferior wall hypokinesis, will get outpatient myoview.  - Etiology unknown. Limit caffeine use. No chest pain. Fairly active.   2. HTN - Relatively stable. - Home meds: Diovan 320mg  and chlorthalidone 25mg  every Monday, Wednesday and Friday.  - Continue Current regimen of Avapro 300mg .   3. HL - On home  dose of Zetia 10mg . LDL of 62, HDL 49.   4. Stroke-  Right splenium punctate infarct, likley embolic secondary to newly diagnosed atrial fibrillation - Deficit resolved.  - MRI Acute 7 mm infarct involving the right splenium. - MRA 8 x 6 mm fusiform aneurysm arising from the distal right vertebral artery/vertebrobasilar junction. Stable comparing with previous study. - Carotid Duplex 1-39% internal carotid artery stenosis bilaterally. Vertebral arteries are patent with antegrade flow. - Per neuro - ASA discontinued. Continue eliquis.   5. Right VA aneurysm - per neuro  Hospital day # 2   [place], [place] 12/22/2014, 1:59 PM Pager [phone]  Co-Sign MD  Patient  seen and examined with Vin Bhagat, PA-C. We discussed all aspects of the encounter. I agree with the assessment and plan as stated above.   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 Admitted with CVA in setting of newly diagnosed AF. Seen by Neuro and started on Eliquis. Now rate controlled on IV diltiazem at 5/hr. Echo images reviewed personally with EF 50-55% with clear inferobasilar HK. LA size > 5.0 cm  He tells me he had a cardiac cath about 12 years ago for CP. Had moderate blockages. F/u Thallium reportedly ok. He is active in his yard without CP.  At this point would recommend: 1) Switch diltiazem to 180mg  po daily 2) Continue Eliquis (cleared by Neurology) - stop ASA 3) Start statin as tolerated. Crestor 10 daily 4) Arrange outpatient Myoview in 4 weeks to further evaluate wall motion abnormality on echo.   Bensimhon, Daniel,MD 4:49 PM

## 2014-12-22 NOTE — Progress Notes (Deleted)
CARDIOLOGY CONSULT NOTE   Patient ID: Steven Meadows MRN: [id number] DOB/AGE: 1932/01/14 81 y.o.  Admit date: 12/20/2014  Primary Physician   Henrine Screws, MD Primary Cardiologist   New Reason for Consultation   Afib with RVR  HPI: Steven Meadows is a 81 y.o. 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   No other cardiac history. He was unable to get any words out and trouble with concentrating.He drove himself to the ED as these symptoms were similar to his previous stroke 15 years ago. CT scan of the Head and an MRI of the Brain were performed and revealed a positive acute CVA. He was not deemed a TPA candidate. He was also found to be in Atrial fibrillation at the time, and he denies any previous diagnosis of Atrial Fibrillation.Patient takes aspirin 325 mg BID.  Overall he feels his condition is completely resolved. He speak as normal now. She was started on eliquis 5mg  BID for stroke prevention. Aspirin discontinued. 2D Echo EF 50-55%, akinesis of the basalinferior myocardium, mild dilated LA, no cardiac source of emboli.   He is Iv dil at rate of 2.21ml/hr and his rate in 90-120s. Elevated with activity. Denies any recent chest pain, sob, LE edema, orthopnea, PND, melena or bright red blood per rectum. For the past 1-2 he has been feeling palpitations. Recently had cleaned his teeth at dentist office, but no procedure. He drinks 2 cups of coffee every morning and 1 caffieenated soda in afternoon.    Past Medical History  Diagnosis Date  . Cough   . Lacunar stroke   . HTN (hypertension)   . Hypercholesteremia   . Aneurysm of vertebral artery   . Low back pain   . Rotator cuff tear   . History of mononucleosis   . Abdominal aortic atherosclerosis   . Actinic keratoses    . Olecranon bursitis      Past Surgical History  Procedure Laterality Date  . Tonsillectomy and adenoidectomy    . Cataract extraction--right eye  2013    Allergies  Allergen Reactions  . Nabumetone Other (See Comments)    Gi upset    I have reviewed the patient's current medications . apixaban  5 mg Oral BID  . ezetimibe  10 mg Oral Daily  . irbesartan  300 mg Oral Daily  . loratadine  10 mg Oral Daily  . pantoprazole  40 mg Oral Daily   . diltiazem (CARDIZEM) infusion 2.5 mg/hr (12/22/14 0118)   hydrALAZINE, senna-docusate  Prior to Admission medications   Medication Sig Start Date End Date Taking? Authorizing Provider  aspirin 325 MG EC tablet Take 325 mg by mouth 2 (two) times daily.   Yes Historical Provider, MD  chlorthalidone (HYGROTON) 25 MG tablet Take 25 mg by mouth every Monday, Wednesday, and Friday.    Yes Historical Provider, MD  ezetimibe (ZETIA) 10 MG tablet Take 10 mg by mouth daily.   Yes Historical Provider, MD  loratadine (CLARITIN) 10 MG tablet Take 10 mg by mouth daily.   Yes Historical Provider, MD  omeprazole (PRILOSEC) 20 MG capsule Take 20 mg by mouth daily. 06/17/11  Yes Collene Gobble, MD  valsartan (DIOVAN) 320 MG tablet Take 320 mg by mouth at bedtime.  Yes Historical Provider, MD  diphenhydrAMINE (BENADRYL) 25 MG tablet Take 2 tablets (50 mg total) by mouth every 6 (six) hours. Take 1-2 tablets every 6 hours x 2 days, then space out to an as needed basis 01/10/14   Alfonzo Beers, MD  oxyCODONE-acetaminophen (PERCOCET/ROXICET) 5-325 MG per tablet Take 1-2 tablets by mouth every 6 (six) hours as needed for severe pain. 01/10/14   Alfonzo Beers, MD  predniSONE (DELTASONE) 10 MG tablet Take 6 tablets (60 mg total) by mouth daily. Take 6, 5, 4, 3, 2, 1 tabs po qD x 3 days each 01/10/14   Alfonzo Beers, MD     Social History   Social History  . Marital Status: Married    Spouse Name: N/A  . Number of Children: N/A  . Years of Education: N/A    Occupational History  . Not on file.   Social History Main Topics  . Smoking status: Never Smoker   . Smokeless tobacco: Not on file  . Alcohol Use: Yes     Comment: 2 drinks per night  . Drug Use: No  . Sexual Activity: Not on file   Other Topics Concern  . Not on file   Social History Narrative    No family status information on file.   Family History  Problem Relation Age of Onset  . Heart attack Paternal Grandfather   . Heart attack Maternal Grandfather   . Cancer Paternal Grandmother      ROS:  Full 14 point review of systems complete and found to be negative unless listed above.  Physical Exam: Blood pressure 115/91, pulse 98, temperature 97.6 F (36.4 C), temperature source Oral, resp. rate 12, height 6' (1.829 m), weight 214 lb 15.2 oz (97.5 kg), SpO2 96 %.  General: Well developed, well nourished, male in no acute distress Head: Eyes PERRLA, No xanthomas. Normocephalic and atraumatic, oropharynx without edema or exudate.  Lungs: Resp regular and unlabored, CTA. Heart: Ir Ir  no s3, s4, or murmurs..   Neck: No carotid bruits. No lymphadenopathy.  JVD. Abdomen: Bowel sounds present, abdomen soft and non-tender without masses or hernias noted. Msk:  No spine or cva tenderness. No weakness, no joint deformities or effusions. Extremities: No clubbing, cyanosis or edema. DP/PT/Radials 2+ and equal bilaterally. Neuro: Alert and oriented X 3. No focal deficits noted. Psych:  Good affect, responds appropriately Skin: No rashes or lesions noted.  Labs:   Lab Results  Component Value Date   WBC 9.1 12/20/2014   HGB 18.4* 12/20/2014   HCT 54.0* 12/20/2014   MCV 86.7 12/20/2014   PLT 154 12/20/2014    Recent Labs  12/20/14 2105  INR 0.93    Recent Labs Lab 12/20/14 2105 12/20/14 2113  NA 138 138  K 3.5 3.5  CL 100* 99*  CO2 25  --   BUN 18 23*  CREATININE 1.20 1.20  CALCIUM 9.8  --   PROT 7.4  --   BILITOT 0.8  --   ALKPHOS 37*  --   ALT 34  --    AST 30  --   GLUCOSE 99 94  ALBUMIN 4.6  --     Recent Labs  12/20/14 2110  TROPIPOC 0.00   No results found for: PROBNP Lab Results  Component Value Date   CHOL 126 12/21/2014   HDL 49 12/21/2014   LDLCALC 62 12/21/2014   TRIG 75 12/21/2014    Echo: 12/21/14 LV EF: 50% -  55%  -------------------------------------------------------------------  Indications:   CVA 47.  ------------------------------------------------------------------- History:  PMH: Vertebral Artery Aneurysm. Stroke. Risk factors: Hypertension. Dyslipidemia.  ------------------------------------------------------------------- Study Conclusions  - Left ventricle: The cavity size was normal. There was mild concentric hypertrophy. Systolic function was normal. The estimated ejection fraction was in the range of 50% to 55%. There is akinesis of the basalinferior myocardium. There was no evidence of elevated ventricular filling pressure by Doppler parameters. - Left atrium: The atrium was mildly dilated.  Impressions:  - No cardiac source of emboli was indentified.   ECG:   Vent. rate 124 BPM PR interval * ms QRS duration 103 ms QT/QTc 355/510 ms P-R-T axes -1 12 45  Radiology:  Ct Head Wo Contrast  12/20/2014   CLINICAL DATA:  Code stroke.  Difficulty with speech.  EXAM: CT HEAD WITHOUT CONTRAST  TECHNIQUE: Contiguous axial images were obtained from the base of the skull through the vertex without intravenous contrast.  COMPARISON:  11/22/2013  FINDINGS: There is no evidence of mass effect, midline shift, or extra-axial fluid collections. There is no evidence of a space-occupying lesion or intracranial hemorrhage. There is left cerebellar low-attenuation concerning for a acute-subacute infarct. Small bilateral lacunar infarcts. There is generalized cerebral atrophy. There is periventricular white matter low attenuation likely secondary to microangiopathy.  The ventricles and sulci  are appropriate for the patient's age. The basal cisterns are patent.  Visualized portions of the orbits are unremarkable. The visualized portions of the paranasal sinuses and mastoid air cells are unremarkable. Cerebrovascular atherosclerotic calcifications are noted.  The osseous structures are unremarkable.  IMPRESSION: 1. Acute versus subacute nonhemorrhagic left cerebellar infarct. These results were called by telephone at the time of interpretation on 12/20/2014 at 9:27 pm to [place] , who verbally acknowledged these results.   Electronically Signed   By: Kathreen Devoid   On: 12/20/2014 21:28   Mr [name] Head Wo Contrast  12/20/2014   CLINICAL DATA:  Initial evaluation for acute aphasia.  EXAM: MRI HEAD WITHOUT CONTRAST  MRA HEAD WITHOUT CONTRAST  TECHNIQUE: Multiplanar, multiecho pulse sequences of the brain and surrounding structures were obtained without intravenous contrast. Angiographic images of the head were obtained using MRA technique without contrast.  COMPARISON:  Prior CT from earlier the same day.  FINDINGS: MRI HEAD FINDINGS  There is a small approximately 7 mm focus of restricted diffusion within the right aspect of the splenium (series 3, image 27 on axial sequence, series 4, image 11 on coronal sequence), suspicious for a small acute ischemic infarct. Corresponding signal loss seen on ADC map. No other foci of restricted diffusion to suggest acute ischemic infarct. Gray-white matter differentiation otherwise maintained. Normal intravascular flow voids are preserved. No acute intracranial hemorrhage.  Diffuse prominence of the CSF containing spaces is compatible with generalized cerebral atrophy. Patchy and confluent T2/FLAIR hyperintensity within the periventricular and deep white matter both cerebral hemispheres most consistent with chronic small vessel ischemic disease. Small remote lacunar infarcts within the bilateral basal ganglia/ corona radiata. Probable additional small remote lacunar  infarct within the central pons with associated chronic micro hemorrhage.  No mass lesion, midline shift, or mass effect. Ventricular prominence related global parenchymal volume loss present without hydrocephalus. No extra-axial fluid collection.  Craniocervical junction within normal limits. Pituitary gland normal. No acute abnormality about the orbits. Mild mucosal thickening within the ethmoidal air cells and maxillary sinuses. Small right mastoid effusion noted. Inner ear structures normal. Bone marrow signal intensity within normal limits. No acute abnormality about the scalp  soft tissues.  MRA HEAD FINDINGS  ANTERIOR CIRCULATION:  Visualized distal cervical segments of the internal carotid arteries are widely patent with antegrade flow. The petrous and cavernous segments are widely patent. Mild narrowing of the supra clinoid right ICA. Supraclinoid left ICA widely patent. Right A1 segment hypoplastic. Left A1 segment widely patent. Anterior communicating artery normal. Anterior cerebral arteries well opacified bilaterally.  M1 segments widely patent without stenosis or occlusion. MCA bifurcations normal. Distal MCA branches fairly symmetric bilaterally and well opacified.  POSTERIOR CIRCULATION:  Right vertebral artery is dominant and patent to the vertebrobasilar junction. Left vertebral artery is diminutive and appears to terminate in PICA. There is a in somewhat fusiform aneurysm arising from the distal right vertebral artery near the vertebrobasilar junction that measures approximately 6 x 8 mm (series 5, image 33). This is distal to the take-off of the right posterior inferior cerebral artery. Basilar artery is somewhat diminutive but patent. Superior cerebellar arteries opacified bilaterally. There is fetal origin of the left PCA with a widely patent left posterior communicating artery. Right P1 segment is hypoplastic, with the right PCA supplied largely via the right posterior communicating artery.  Posterior cerebral arteries are well opacified to their distal aspects.  IMPRESSION: MRI HEAD IMPRESSION:  1. Acute 7 mm ischemic nonhemorrhagic infarct involving the right splenium without mass effect. 2. Atrophy with chronic microvascular ischemic disease and multiple small remote lacunar infarcts as above.  MRA HEAD IMPRESSION:  1. No proximal or large arterial branch occlusion identified within the intracranial circulation. 2. No hemodynamically significant or correctable stenosis identified. 3. Diminutive vertebrobasilar system, with fetal origin of the left PCA and the right PCA supplied largely via a prominent right posterior communicating artery. 4. 8 x 6 mm fusiform aneurysm arising from the distal right vertebral artery/vertebrobasilar junction. 5. Diminutive left vertebral artery terminates in PICA.   Electronically Signed   By: Jeannine Boga M.D.   On: 12/20/2014 23:08   Mr Brain Wo Contrast  12/20/2014   CLINICAL DATA:  Initial evaluation for acute aphasia.  EXAM: MRI HEAD WITHOUT CONTRAST  MRA HEAD WITHOUT CONTRAST  TECHNIQUE: Multiplanar, multiecho pulse sequences of the brain and surrounding structures were obtained without intravenous contrast. Angiographic images of the head were obtained using MRA technique without contrast.  COMPARISON:  Prior CT from earlier the same day.  FINDINGS: MRI HEAD FINDINGS  There is a small approximately 7 mm focus of restricted diffusion within the right aspect of the splenium (series 3, image 27 on axial sequence, series 4, image 11 on coronal sequence), suspicious for a small acute ischemic infarct. Corresponding signal loss seen on ADC map. No other foci of restricted diffusion to suggest acute ischemic infarct. Gray-white matter differentiation otherwise maintained. Normal intravascular flow voids are preserved. No acute intracranial hemorrhage.  Diffuse prominence of the CSF containing spaces is compatible with generalized cerebral atrophy. Patchy and  confluent T2/FLAIR hyperintensity within the periventricular and deep white matter both cerebral hemispheres most consistent with chronic small vessel ischemic disease. Small remote lacunar infarcts within the bilateral basal ganglia/ corona radiata. Probable additional small remote lacunar infarct within the central pons with associated chronic micro hemorrhage.  No mass lesion, midline shift, or mass effect. Ventricular prominence related global parenchymal volume loss present without hydrocephalus. No extra-axial fluid collection.  Craniocervical junction within normal limits. Pituitary gland normal. No acute abnormality about the orbits. Mild mucosal thickening within the ethmoidal air cells and maxillary sinuses. Small right mastoid effusion noted. Inner ear  structures normal. Bone marrow signal intensity within normal limits. No acute abnormality about the scalp soft tissues.  MRA HEAD FINDINGS  ANTERIOR CIRCULATION:  Visualized distal cervical segments of the internal carotid arteries are widely patent with antegrade flow. The petrous and cavernous segments are widely patent. Mild narrowing of the supra clinoid right ICA. Supraclinoid left ICA widely patent. Right A1 segment hypoplastic. Left A1 segment widely patent. Anterior communicating artery normal. Anterior cerebral arteries well opacified bilaterally.  M1 segments widely patent without stenosis or occlusion. MCA bifurcations normal. Distal MCA branches fairly symmetric bilaterally and well opacified.  POSTERIOR CIRCULATION:  Right vertebral artery is dominant and patent to the vertebrobasilar junction. Left vertebral artery is diminutive and appears to terminate in PICA. There is a in somewhat fusiform aneurysm arising from the distal right vertebral artery near the vertebrobasilar junction that measures approximately 6 x 8 mm (series 5, image 33). This is distal to the take-off of the right posterior inferior cerebral artery. Basilar artery is  somewhat diminutive but patent. Superior cerebellar arteries opacified bilaterally. There is fetal origin of the left PCA with a widely patent left posterior communicating artery. Right P1 segment is hypoplastic, with the right PCA supplied largely via the right posterior communicating artery. Posterior cerebral arteries are well opacified to their distal aspects.  IMPRESSION: MRI HEAD IMPRESSION:  1. Acute 7 mm ischemic nonhemorrhagic infarct involving the right splenium without mass effect. 2. Atrophy with chronic microvascular ischemic disease and multiple small remote lacunar infarcts as above.  MRA HEAD IMPRESSION:  1. No proximal or large arterial branch occlusion identified within the intracranial circulation. 2. No hemodynamically significant or correctable stenosis identified. 3. Diminutive vertebrobasilar system, with fetal origin of the left PCA and the right PCA supplied largely via a prominent right posterior communicating artery. 4. 8 x 6 mm fusiform aneurysm arising from the distal right vertebral artery/vertebrobasilar junction. 5. Diminutive left vertebral artery terminates in PICA.   Electronically Signed   By: Jeannine Boga M.D.   On: 12/20/2014 23:08    ASSESSMENT AND PLAN:     1. New onset Afib with RVR - CHADSVASc score of 6 (age, HTN, stroke, vascular disease) - Currently on IV dilt (2.18ml/hr) at rate of 90-110s. Rate 115-138 with gait during PT. Will titrate up IV dilt.  - Continue Eliquis 5mg  BID.  - 2D Echo EF 50-55%, akinesis of the basalinferior myocardium, mild dilated LA, no cardiac source of emboli.  - Etiology unknown. Limit caffeine use. No chest pain. Fairly active.   2. HTN - Relatively stable. - Home meds: Diovan 320mg  and chlorthalidone 25mg  every Monday, Wednesday and Friday.  - Continue Current regimen of Avapro 300mg .   3. HL - On home dose of Zetia 10mg . LDL of 62, HDL 49.   4. Stroke-  Right splenium punctate infarct, likley embolic secondary to  newly diagnosed atrial fibrillation - Deficit resolved.  - MRI Acute 7 mm infarct involving the right splenium. - MRA 8 x 6 mm fusiform aneurysm arising from the distal right vertebral artery/vertebrobasilar junction. Stable comparing with previous study. - Carotid Duplex 1-39% internal carotid artery stenosis bilaterally. Vertebral arteries are patent with antegrade flow. - Per neuro - ASA discontinued. Continue eliquis.   5. Right VA aneurysm - per neuro  Hospital day # 2  Signed: [place], [place] 12/22/2014, 1:59 PM Pager [phone]  Co-Sign MD

## 2014-12-22 NOTE — Progress Notes (Signed)
STROKE TEAM PROGRESS NOTE   HISTORY Steven Meadows is an 81 y.o. 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 Patient said that he was home was alone working on his taxes from last year when suddenly realized that he was having trouble putting thoughts together and was unable to remember who he was supposed to pick up and take to the golf tournament in the am. These symptoms were similar to his previous stroke 44yrs ago. He drove himself to the hospital where on initial evaluation by ED attending he was noted to have trouble speaking, words were not coming out correctly.  Denies associated HA, vertigo, double vision, difficulty swallowing, focal weakness or numbness, or vision impairment. NIHSS 1 for language. CT brain was personally reviewed and showed no acute abnormality. A subsequent MRI/MRA brain were independently reviewed and demonstrated acute small infarction involving the right splenium of corpus callosum and no proximal or large arterial branch occlusion identified within the intracranial circulation respectively. Patient takes aspirin 325 mg BID.  Date last known well:  Time last known well:  tPA Given: no, minimal language deficit that is rapidly improving NIHSS: 1 MRS: 0   SUBJECTIVE (INTERVAL HISTORY) No family is at the bedside.  Overall he feels his condition is completely resolved. He speak as normal now. 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 Ellene Route at [place]. Discussed with Dr. Kathyrn Sheriff yesterday and he agrees with eliquis for stroke prevention.  OBJECTIVE Temp:  [97.4 F (36.3 C)-98.1 F (36.7 C)] 97.7 F (36.5 C) (08/19 0712) Pulse Rate:  [57-90] 71 (08/19 0712) Cardiac Rhythm:  [-] Atrial fibrillation (08/18 2030) Resp:  [11-19] 11 (08/19 0712) BP:  (111-147)/(56-101) 147/94 mmHg (08/19 0712) SpO2:  [95 %-99 %] 97 % (08/19 0712)  No results for input(s): GLUCAP in the last 168 hours.  Recent Labs Lab 12/20/14 2105 12/20/14 2113  NA 138 138  K 3.5 3.5  CL 100* 99*  CO2 25  --   GLUCOSE 99 94  BUN 18 23*  CREATININE 1.20 1.20  CALCIUM 9.8  --     Recent Labs Lab 12/20/14 2105  AST 30  ALT 34  ALKPHOS 37*  BILITOT 0.8  PROT 7.4  ALBUMIN 4.6    Recent Labs Lab 12/20/14 2105 12/20/14 2113  WBC 9.1  --   NEUTROABS 3.8  --   HGB 16.7 18.4*  HCT 48.2 54.0*  MCV 86.7  --   PLT 154  --    No results for input(s): CKTOTAL, CKMB, CKMBINDEX, TROPONINI in the last 168 hours.  Recent Labs  12/20/14 2105  LABPROT 12.6  INR 0.93   No results for input(s): COLORURINE, LABSPEC, PHURINE, GLUCOSEU, HGBUR, BILIRUBINUR, KETONESUR, PROTEINUR, UROBILINOGEN, NITRITE, LEUKOCYTESUR in the last 72 hours.  Invalid input(s): APPERANCEUR     Component Value Date/Time   CHOL 126 12/21/2014 0345   TRIG 75 12/21/2014 0345   HDL 49 12/21/2014 0345   CHOLHDL 2.6 12/21/2014 0345   VLDL 15 12/21/2014 0345   LDLCALC 62 12/21/2014 0345   Lab Results  Component Value Date   HGBA1C 6.5* 12/21/2014      Component Value Date/Time   LABOPIA NONE DETECTED 12/21/2014 1150   COCAINSCRNUR NONE DETECTED 12/21/2014 1150   LABBENZ NONE DETECTED 12/21/2014 1150   AMPHETMU NONE DETECTED 12/21/2014 1150  THCU NONE DETECTED 12/21/2014 1150   LABBARB NONE DETECTED 12/21/2014 1150    No results for input(s): ETH in the last 168 hours.   IMAGING  Ct Head Wo Contrast 12/20/2014    1. Acute versus subacute nonhemorrhagic left cerebellar infarct.   Mr [name] Head Wo Contrast 12/20/2014    MRI HEAD   1. Acute 7 mm ischemic nonhemorrhagic infarct involving the right splenium without mass effect.  2. Atrophy with chronic microvascular ischemic disease and multiple small remote lacunar infarcts as above.   MRA HEAD  1. No proximal or large  arterial branch occlusion identified within the intracranial circulation.  2. No hemodynamically significant or correctable stenosis identified.  3. Diminutive vertebrobasilar system, with fetal origin of the left PCA and the right PCA supplied largely via a prominent right posterior communicating artery.  4. 8 x 6 mm fusiform aneurysm arising from the distal right vertebral artery/vertebrobasilar junction.  5. Diminutive left vertebral artery terminates in PICA.     CUS - Bilateral: 1-39% ICA stenosis. Vertebral artery flow is antegrade.  2D echo - - Left ventricle: The cavity size was normal. There was mild concentric hypertrophy. Systolic function was normal. The estimated ejection fraction was in the range of 50% to 55%. There is akinesis of the basalinferior myocardium. There was no evidence of elevated ventricular filling pressure by Doppler parameters. - Left atrium: The atrium was mildly dilated. Impressions: - No cardiac source of emboli was indentified.   PHYSICAL EXAM  Temp:  [97.4 F (36.3 C)-98.1 F (36.7 C)] 97.7 F (36.5 C) (08/19 0712) Pulse Rate:  [57-90] 71 (08/19 0712) Resp:  [11-19] 11 (08/19 0712) BP: (111-147)/(56-101) 147/94 mmHg (08/19 0712) SpO2:  [95 %-99 %] 97 % (08/19 0712)  General - Well nourished, well developed, in no apparent distress.  Ophthalmologic - Fundi not visualized due to eye movement.  Cardiovascular - irregularly irregular heart rate and rhythm.  Mental Status -  Level of arousal and orientation to time, place, and person were intact. Language including expression, naming, repetition, comprehension was assessed and found intact. Fund of Knowledge was assessed and was intact.  Cranial Nerves II - XII - II - Visual field intact OU. III, IV, VI - Extraocular movements intact. V - Facial sensation intact bilaterally. VII - Facial movement intact bilaterally. VIII - Hearing & vestibular intact bilaterally. X - Palate  elevates symmetrically. XI - Chin turning & shoulder shrug intact bilaterally. XII - Tongue protrusion intact.  Motor Strength - The patient's strength was normal in all extremities and pronator drift was absent.  Bulk was normal and fasciculations were absent.   Motor Tone - Muscle tone was assessed at the neck and appendages and was normal.  Reflexes - The patient's reflexes were 1+ in all extremities and he had no pathological reflexes.  Sensory - Light touch, temperature/pinprick were assessed and were symmetrical.    Coordination - The patient had normal movements in the hands and feet with no ataxia or dysmetria.  Tremor was absent.  Gait and Station - The patient's transfers, posture, gait, station, and turns were observed as normal.  ASSESSMENT/PLAN Steven Meadows is a 81 y.o. male with history of hypertension, hyperlipidemia, previous infarcts, vertebral artery aneurysm, presenting with confusion and speech difficulties. He did not receive IV t-PA due to minimal improving deficits.   Stroke:  Right splenium punctate infarct, likley embolic secondary to newly diagnosed atrial fibrillation.  Resultant  Deficit resolved  MRI  Acute 7  mm infarct involving the right splenium.   MRA  8 x 6 mm fusiform aneurysm arising from the distal right vertebral artery/vertebrobasilar junction. Stable comparing with previous study.   Carotid Doppler unremarkable  2D Echo EF 50-55%  LDL 62  HgbA1c 6.5  Lovenox for VTE prophylaxis Diet Heart Room service appropriate?: Yes; Fluid consistency:: Thin  aspirin 325 mg orally every day prior to admission, now on aspirin 300 mg suppository daily. Due to small infarct size and afib as the cause of stroke, recommend eliquis 5mg  bid for stroke prevention. Have discussed with Dr. Kathyrn Sheriff in [place] for his stable right VA aneurysm, we felt benefit is more than risk and we both agree eliquis is appropriate for him. He is following up with Dr. Ellene Route  as outpt and he needs to continue follow up with Dr. Ellene Route after discharge. ASA discontinued.  Patient counseled to be compliant with his antithrombotic medications  Ongoing aggressive stroke risk factor management  Therapy recommendations:  Pending  Disposition: Pending  Afib with RVR  On cardizem  Cardiology on board  Recommend eliquis 5 mg bid for stroke prevention  CHA2DS2-VASc = 6         Condition Points   C CHF (or Left ventricular systolic dysfunction) 0   H HTN: BP consistently above 140/90 mmHg (or treated HTN on meds)  1   A2 Age ?35 years 2   D DM 0   S2 Prior Stroke or TIA or thromboembolism 2   V Vascular disease (e.g. PAD, MI, aortic plaque) 1   A Age 55-74 years 22   [place] male sex  0           Annual Stroke Risk CHA2DS2-VASc Score Stroke Risk % 95% CI  0 0  1 1.3  2 2.2  3 3.2  4 4.0  5 6.7  6 9.8  7 9.6  8 12.5  9 15.2      Right VA aneurysm  Stable over the years  Following up with Dr. Ellene Route as outpt  Have discussed with Dr. Kathyrn Sheriff in [place] for his stable right VA aneurysm, we felt benefit is more than risk and we both agree eliquis is appropriate for him.  He needs continue follow up with Dr. Ellene Route after discharge.  ASA discontinued.  Hypertension  Home meds: Diovan and chlorthalidone  Stable  Patient counseled to be compliant with his blood pressure medications  Cardiology on board  Hyperlipidemia  Home meds: Zetia 10 mg daily  resumed in hospital  LDL 62, goal < 70  Other Stroke Risk Factors  Advanced age  Obesity, Body mass index is 29.15 kg/(m^2).   Hx stroke/TIA  Other Active Problems    Other Pertinent History    Hospital day # 2  Neurology will sign off. Please call with questions. Pt will follow up with Dr. Erlinda Hong at [facility] in about 2 months. Thanks for the consult.  Rosalin Hawking, MD PhD Stroke Neurology 12/22/2014 8:49 AM   To contact Stroke Continuity provider, please refer to [email or web address] After  hours, contact General Neurology

## 2014-12-23 DIAGNOSIS — I638 Other cerebral infarction: Secondary | ICD-10-CM

## 2014-12-23 LAB — CBC
HCT: 46.2 % (ref 39.0–52.0)
Hemoglobin: 15.4 g/dL (ref 13.0–17.0)
MCH: 28.8 pg (ref 26.0–34.0)
MCHC: 33.3 g/dL (ref 30.0–36.0)
MCV: 86.4 fL (ref 78.0–100.0)
Platelets: 169 10*3/uL (ref 150–400)
RBC: 5.35 MIL/uL (ref 4.22–5.81)
RDW: 13.9 % (ref 11.5–15.5)
WBC: 6.6 10*3/uL (ref 4.0–10.5)

## 2014-12-23 MED ORDER — DILTIAZEM HCL ER COATED BEADS 240 MG PO CP24
240.0000 mg | ORAL_CAPSULE | Freq: Every day | ORAL | Status: DC
  Administered 2014-12-23: 240 mg via ORAL
  Filled 2014-12-23: qty 1

## 2014-12-23 MED ORDER — ROSUVASTATIN CALCIUM 10 MG PO TABS: 10.0000 mg | ORAL_TABLET | Freq: Every day | ORAL | Status: DC

## 2014-12-23 MED ORDER — DILTIAZEM HCL ER COATED BEADS 240 MG PO CP24: 240.0000 mg | ORAL_CAPSULE | Freq: Every day | ORAL | Status: DC

## 2014-12-23 MED ORDER — SENNOSIDES-DOCUSATE SODIUM 8.6-50 MG PO TABS: 1.0000 | ORAL_TABLET | Freq: Every evening | ORAL | Status: DC | PRN

## 2014-12-23 MED ORDER — APIXABAN 5 MG PO TABS
5.0000 mg | ORAL_TABLET | Freq: Two times a day (BID) | ORAL | Status: DC
Start: 2014-12-23 — End: 2020-04-30

## 2014-12-23 NOTE — Discharge Summary (Signed)
Physician Discharge Summary  Steven Meadows ZSW:[id number] DOB: 26-Jan-1932 DOA: 12/20/2014  PCP: Henrine Screws, MD  Admit date: 12/20/2014 Discharge date: 12/23/2014  Time spent: 30 minutes  Recommendations for Outpatient Follow-up:  1. Follow up with PCP in one week.  2. Follow up with cardiology for holter monitor and possible stress test .  3. Follow up with neurology in 2 months.  4. Follow up with neuro surgery in 1 to 2 week.s   Discharge Diagnoses:  Principal Problem:   CVA (cerebral infarction) Active Problems:   Hypertension   Hyperlipidemia   Stroke   Atrial fibrillation with RVR   Aneurysm of vertebral artery   Stroke with cerebral ischemia   Discharge Condition: improved   Diet recommendation: low sodium diet/   Filed Weights   12/20/14 2125 12/21/14 0050 12/22/14 1916  Weight: 101.1 kg (222 lb 14.2 oz) 97.5 kg (214 lb 15.2 oz) 97.16 kg (214 lb 3.2 oz)    History of present illness:  Steven Meadows is a 81 y.o. male with a history of Remote CVA 15 years ago, HTN, Hyperlipidemia, and Vertebral Artery Aneurysm presented to the ED with complaints of speech impairment. He was found to have a acute stroke.   Hospital Course:   Acute 7 mm non hemorrhagic, ischemic infarct : Admitted to step down initially, transferred to telemetry.  MRA head does not sow any large arterial branch occlusion. Distal right VA aneurysm seen.  Carotid duplex showed bilateral 1 to 39% ICA stenosis. 2 D echocardiogram does not show any emboli. hgba1c is 6.5, LDL is 62.  The small stroke probably from atrial fibrillation.  Stopped aspirin and started on eliquis .  Follow up with neurology as recommended.   Atrial fibrillation: Rate not well controlled. On cardizem gtt.  Cardiology consulted and started on eliquis. Change cardizem to po on discharge.  willneed holter monitor after discharge. Follow up with cardiology as recommended.    Right VA aneurysm: Follows with Dr Ellene Route  as outpatient.  Recommend outpatient follow u on discharge.    Hypertension: Better controlled.   Hyperlipidemia: Resume zetia.  Procedures:  MRI brain  Echocardiogram  Carotid duplex.  Consultations: Neurology cardiology  Discharge Exam: Filed Vitals:   12/23/14 1339  BP: 139/87  Pulse: 85  Temp:   Resp: 18    General: alert afebrile comfortable. Cardiovascular: s1s2 Respiratory: ctab  Discharge Instructions   Discharge Instructions    Ambulatory referral to Neurology    Complete by:  As directed   Pt will follow up with Dr. Erlinda Hong at [facility] in about 2 months. Thanks.     Diet - low sodium heart healthy    Complete by:  As directed      Discharge instructions    Complete by:  As directed   Follow up with PCP in one week.  Follow u pwith cardiology in one week. For holter monitor placement.  Please follow up with Dr Ellene Route in one week.          Current Discharge Medication List    START taking these medications   Details  apixaban (ELIQUIS) 5 MG TABS tablet Take 1 tablet (5 mg total) by mouth 2 (two) times daily. Qty: 60 tablet, Refills: 1    diltiazem (CARDIZEM CD) 240 MG 24 hr capsule Take 1 capsule (240 mg total) by mouth daily. Qty: 30 capsule, Refills: 0    rosuvastatin (CRESTOR) 10 MG tablet Take 1 tablet (10 mg total) by mouth daily  at 6 PM. Qty: 30 tablet, Refills: 0    senna-docusate (SENOKOT-S) 8.6-50 MG per tablet Take 1 tablet by mouth at bedtime as needed for moderate constipation.      CONTINUE these medications which have NOT CHANGED   Details  chlorthalidone (HYGROTON) 25 MG tablet Take 25 mg by mouth every Monday, Wednesday, and Friday.     ezetimibe (ZETIA) 10 MG tablet Take 10 mg by mouth daily.    loratadine (CLARITIN) 10 MG tablet Take 10 mg by mouth daily.    omeprazole (PRILOSEC) 20 MG capsule Take 20 mg by mouth daily.    valsartan (DIOVAN) 320 MG tablet Take 320 mg by mouth at bedtime.       STOP taking these  medications     aspirin 325 MG EC tablet      diphenhydrAMINE (BENADRYL) 25 MG tablet      oxyCODONE-acetaminophen (PERCOCET/ROXICET) 5-325 MG per tablet      predniSONE (DELTASONE) 10 MG tablet        Allergies  Allergen Reactions  . Nabumetone Other (See Comments)    Gi upset   Follow-up Information    Follow up with Xu,Jindong, MD. Schedule an appointment as soon as possible for a visit in 2 months.   Specialty:  Neurology   Why:  stroke clinic   Contact information:   [address] [place] [address] [phone]       Follow up with [facility] On 12/29/2014.   Specialty:  Cardiology   Why:  @ 8:15 for outpatient Myoview   Contact information:   [address] [place] [phone]      Follow up with Lyda Jester, PA-C On 12/22/2014.   Specialties:  Cardiology, Radiology   Why:  @9 :30 for cardiology follow up    Contact information:   [place] [address] [phone]       Follow up with GATES,ROBERT NEVILL, MD. Schedule an appointment as soon as possible for a visit in 1 week.   Specialty:  Internal Medicine   Contact information:   [address]  [place] [address] [phone]       Follow up with Earleen Newport, MD. Schedule an appointment as soon as possible for a visit in 1 week.   Specialty:  Neurosurgery   Contact information:   [address] [place] [address] [phone]        The results of significant diagnostics from this hospitalization (including imaging, microbiology, ancillary and laboratory) are listed below for reference.    Significant Diagnostic Studies: Ct Head Wo Contrast  12/20/2014   CLINICAL DATA:  Code stroke.  Difficulty with speech.  EXAM: CT HEAD WITHOUT CONTRAST  TECHNIQUE: Contiguous axial images were obtained from the base of the skull through the vertex without  intravenous contrast.  COMPARISON:  11/22/2013  FINDINGS: There is no evidence of mass effect, midline shift, or extra-axial fluid collections. There is no evidence of a space-occupying lesion or intracranial hemorrhage. There is left cerebellar low-attenuation concerning for a acute-subacute infarct. Small bilateral lacunar infarcts. There is generalized cerebral atrophy. There is periventricular white matter low attenuation likely secondary to microangiopathy.  The ventricles and sulci are appropriate for the patient's age. The basal cisterns are patent.  Visualized portions of the orbits are unremarkable. The visualized portions of the paranasal sinuses and mastoid air cells are unremarkable. Cerebrovascular  atherosclerotic calcifications are noted.  The osseous structures are unremarkable.  IMPRESSION: 1. Acute versus subacute nonhemorrhagic left cerebellar infarct. These results were called by telephone at the time of interpretation on 12/20/2014 at 9:27 pm to [facility] , who verbally acknowledged these results.   Electronically Signed   By: Kathreen Devoid   On: 12/20/2014 21:28   Mr [name] Head Wo Contrast  12/20/2014   CLINICAL DATA:  Initial evaluation for acute aphasia.  EXAM: MRI HEAD WITHOUT CONTRAST  MRA HEAD WITHOUT CONTRAST  TECHNIQUE: Multiplanar, multiecho pulse sequences of the brain and surrounding structures were obtained without intravenous contrast. Angiographic images of the head were obtained using MRA technique without contrast.  COMPARISON:  Prior CT from earlier the same day.  FINDINGS: MRI HEAD FINDINGS  There is a small approximately 7 mm focus of restricted diffusion within the right aspect of the splenium (series 3, image 27 on axial sequence, series 4, image 11 on coronal sequence), suspicious for a small acute ischemic infarct. Corresponding signal loss seen on ADC map. No other foci of restricted diffusion to suggest acute ischemic infarct. Gray-white matter differentiation otherwise  maintained. Normal intravascular flow voids are preserved. No acute intracranial hemorrhage.  Diffuse prominence of the CSF containing spaces is compatible with generalized cerebral atrophy. Patchy and confluent T2/FLAIR hyperintensity within the periventricular and deep white matter both cerebral hemispheres most consistent with chronic small vessel ischemic disease. Small remote lacunar infarcts within the bilateral basal ganglia/ corona radiata. Probable additional small remote lacunar infarct within the central pons with associated chronic micro hemorrhage.  No mass lesion, midline shift, or mass effect. Ventricular prominence related global parenchymal volume loss present without hydrocephalus. No extra-axial fluid collection.  Craniocervical junction within normal limits. Pituitary gland normal. No acute abnormality about the orbits. Mild mucosal thickening within the ethmoidal air cells and maxillary sinuses. Small right mastoid effusion noted. Inner ear structures normal. Bone marrow signal intensity within normal limits. No acute abnormality about the scalp soft tissues.  MRA HEAD FINDINGS  ANTERIOR CIRCULATION:  Visualized distal cervical segments of the internal carotid arteries are widely patent with antegrade flow. The petrous and cavernous segments are widely patent. Mild narrowing of the supra clinoid right ICA. Supraclinoid left ICA widely patent. Right A1 segment hypoplastic. Left A1 segment widely patent. Anterior communicating artery normal. Anterior cerebral arteries well opacified bilaterally.  M1 segments widely patent without stenosis or occlusion. MCA bifurcations normal. Distal MCA branches fairly symmetric bilaterally and well opacified.  POSTERIOR CIRCULATION:  Right vertebral artery is dominant and patent to the vertebrobasilar junction. Left vertebral artery is diminutive and appears to terminate in PICA. There is a in somewhat fusiform aneurysm arising from the distal right vertebral  artery near the vertebrobasilar junction that measures approximately 6 x 8 mm (series 5, image 33). This is distal to the take-off of the right posterior inferior cerebral artery. Basilar artery is somewhat diminutive but patent. Superior cerebellar arteries opacified bilaterally. There is fetal origin of the left PCA with a widely patent left posterior communicating artery. Right P1 segment is hypoplastic, with the right PCA supplied largely via the right posterior communicating artery. Posterior cerebral arteries are well opacified to their distal aspects.  IMPRESSION: MRI HEAD IMPRESSION:  1. Acute 7 mm ischemic nonhemorrhagic infarct involving the right splenium without mass effect. 2. Atrophy with chronic microvascular ischemic disease and multiple small remote lacunar infarcts as above.  MRA HEAD IMPRESSION:  1. No proximal or large arterial branch occlusion identified  within the intracranial circulation. 2. No hemodynamically significant or correctable stenosis identified. 3. Diminutive vertebrobasilar system, with fetal origin of the left PCA and the right PCA supplied largely via a prominent right posterior communicating artery. 4. 8 x 6 mm fusiform aneurysm arising from the distal right vertebral artery/vertebrobasilar junction. 5. Diminutive left vertebral artery terminates in PICA.   Electronically Signed   By: Jeannine Boga M.D.   On: 12/20/2014 23:08   Mr Brain Wo Contrast  12/20/2014   CLINICAL DATA:  Initial evaluation for acute aphasia.  EXAM: MRI HEAD WITHOUT CONTRAST  MRA HEAD WITHOUT CONTRAST  TECHNIQUE: Multiplanar, multiecho pulse sequences of the brain and surrounding structures were obtained without intravenous contrast. Angiographic images of the head were obtained using MRA technique without contrast.  COMPARISON:  Prior CT from earlier the same day.  FINDINGS: MRI HEAD FINDINGS  There is a small approximately 7 mm focus of restricted diffusion within the right aspect of the  splenium (series 3, image 27 on axial sequence, series 4, image 11 on coronal sequence), suspicious for a small acute ischemic infarct. Corresponding signal loss seen on ADC map. No other foci of restricted diffusion to suggest acute ischemic infarct. Gray-white matter differentiation otherwise maintained. Normal intravascular flow voids are preserved. No acute intracranial hemorrhage.  Diffuse prominence of the CSF containing spaces is compatible with generalized cerebral atrophy. Patchy and confluent T2/FLAIR hyperintensity within the periventricular and deep white matter both cerebral hemispheres most consistent with chronic small vessel ischemic disease. Small remote lacunar infarcts within the bilateral basal ganglia/ corona radiata. Probable additional small remote lacunar infarct within the central pons with associated chronic micro hemorrhage.  No mass lesion, midline shift, or mass effect. Ventricular prominence related global parenchymal volume loss present without hydrocephalus. No extra-axial fluid collection.  Craniocervical junction within normal limits. Pituitary gland normal. No acute abnormality about the orbits. Mild mucosal thickening within the ethmoidal air cells and maxillary sinuses. Small right mastoid effusion noted. Inner ear structures normal. Bone marrow signal intensity within normal limits. No acute abnormality about the scalp soft tissues.  MRA HEAD FINDINGS  ANTERIOR CIRCULATION:  Visualized distal cervical segments of the internal carotid arteries are widely patent with antegrade flow. The petrous and cavernous segments are widely patent. Mild narrowing of the supra clinoid right ICA. Supraclinoid left ICA widely patent. Right A1 segment hypoplastic. Left A1 segment widely patent. Anterior communicating artery normal. Anterior cerebral arteries well opacified bilaterally.  M1 segments widely patent without stenosis or occlusion. MCA bifurcations normal. Distal MCA branches fairly  symmetric bilaterally and well opacified.  POSTERIOR CIRCULATION:  Right vertebral artery is dominant and patent to the vertebrobasilar junction. Left vertebral artery is diminutive and appears to terminate in PICA. There is a in somewhat fusiform aneurysm arising from the distal right vertebral artery near the vertebrobasilar junction that measures approximately 6 x 8 mm (series 5, image 33). This is distal to the take-off of the right posterior inferior cerebral artery. Basilar artery is somewhat diminutive but patent. Superior cerebellar arteries opacified bilaterally. There is fetal origin of the left PCA with a widely patent left posterior communicating artery. Right P1 segment is hypoplastic, with the right PCA supplied largely via the right posterior communicating artery. Posterior cerebral arteries are well opacified to their distal aspects.  IMPRESSION: MRI HEAD IMPRESSION:  1. Acute 7 mm ischemic nonhemorrhagic infarct involving the right splenium without mass effect. 2. Atrophy with chronic microvascular ischemic disease and multiple small remote lacunar infarcts as  above.  MRA HEAD IMPRESSION:  1. No proximal or large arterial branch occlusion identified within the intracranial circulation. 2. No hemodynamically significant or correctable stenosis identified. 3. Diminutive vertebrobasilar system, with fetal origin of the left PCA and the right PCA supplied largely via a prominent right posterior communicating artery. 4. 8 x 6 mm fusiform aneurysm arising from the distal right vertebral artery/vertebrobasilar junction. 5. Diminutive left vertebral artery terminates in PICA.   Electronically Signed   By: Jeannine Boga M.D.   On: 12/20/2014 23:08    Microbiology: Recent Results (from the past 240 hour(s))  MRSA PCR Screening     Status: None   Collection Time: 12/21/14 12:51 AM  Result Value Ref Range Status   MRSA by PCR NEGATIVE NEGATIVE Final    Comment:        The GeneXpert MRSA Assay  (FDA approved for NASAL specimens only), is one component of a comprehensive MRSA colonization surveillance program. It is not intended to diagnose MRSA infection nor to guide or monitor treatment for MRSA infections.      Labs: Basic Metabolic Panel:  Recent Labs Lab 12/20/14 2105 12/20/14 2113  NA 138 138  K 3.5 3.5  CL 100* 99*  CO2 25  --   GLUCOSE 99 94  BUN 18 23*  CREATININE 1.20 1.20  CALCIUM 9.8  --    Liver Function Tests:  Recent Labs Lab 12/20/14 2105  AST 30  ALT 34  ALKPHOS 37*  BILITOT 0.8  PROT 7.4  ALBUMIN 4.6   No results for input(s): LIPASE, AMYLASE in the last 168 hours. No results for input(s): AMMONIA in the last 168 hours. CBC:  Recent Labs Lab 12/20/14 2105 12/20/14 2113 12/23/14 1155  WBC 9.1  --  6.6  NEUTROABS 3.8  --   --   HGB 16.7 18.4* 15.4  HCT 48.2 54.0* 46.2  MCV 86.7  --  86.4  PLT 154  --  169   Cardiac Enzymes: No results for input(s): CKTOTAL, CKMB, CKMBINDEX, TROPONINI in the last 168 hours. BNP: BNP (last 3 results) No results for input(s): BNP in the last 8760 hours.  ProBNP (last 3 results) No results for input(s): PROBNP in the last 8760 hours.  CBG: No results for input(s): GLUCAP in the last 168 hours.     SignedHosie Poisson  [facility] 12/23/2014, 2:12 PM

## 2014-12-23 NOTE — Progress Notes (Signed)
Nsg Discharge Note  Admit Date:  12/20/2014 Discharge date: 12/23/2014   Steven Meadows to be D/C'd Home per MD order.  AVS completed.  Copy for chart, and copy for patient signed, and dated. Patient/caregiver able to verbalize understanding.  Discharge Medication:   Medication List    STOP taking these medications        aspirin 325 MG EC tablet     diphenhydrAMINE 25 MG tablet  Commonly known as:  BENADRYL     oxyCODONE-acetaminophen 5-325 MG per tablet  Commonly known as:  PERCOCET/ROXICET     predniSONE 10 MG tablet  Commonly known as:  DELTASONE      TAKE these medications        apixaban 5 MG Tabs tablet  Commonly known as:  ELIQUIS  Take 1 tablet (5 mg total) by mouth 2 (two) times daily.     chlorthalidone 25 MG tablet  Commonly known as:  HYGROTON  Take 25 mg by mouth every Monday, Wednesday, and Friday.     diltiazem 240 MG 24 hr capsule  Commonly known as:  CARDIZEM CD  Take 1 capsule (240 mg total) by mouth daily.     ezetimibe 10 MG tablet  Commonly known as:  ZETIA  Take 10 mg by mouth daily.     loratadine 10 MG tablet  Commonly known as:  CLARITIN  Take 10 mg by mouth daily.     omeprazole 20 MG capsule  Commonly known as:  PRILOSEC  Take 20 mg by mouth daily.     rosuvastatin 10 MG tablet  Commonly known as:  CRESTOR  Take 1 tablet (10 mg total) by mouth daily at 6 PM.     senna-docusate 8.6-50 MG per tablet  Commonly known as:  Senokot-S  Take 1 tablet by mouth at bedtime as needed for moderate constipation.     valsartan 320 MG tablet  Commonly known as:  DIOVAN  Take 320 mg by mouth at bedtime.        Discharge Assessment: Filed Vitals:   12/23/14 1339  BP: 139/87  Pulse: 85  Temp:   Resp: 18   Skin clean, dry and intact without evidence of skin break down, no evidence of skin tears noted. IV catheter discontinued intact. Site without signs and symptoms of complications - no redness or edema noted at insertion site, patient  denies c/o pain - only slight tenderness at site.  Dressing with slight pressure applied.  D/c Instructions-Education: Discharge instructions given to patient/family with verbalized understanding. D/c education completed with patient/family including follow up instructions, medication list, d/c activities limitations if indicated, with other d/c instructions as indicated by MD - patient able to verbalize understanding, all questions fully answered. Patient instructed to return to ED, call 911, or call MD for any changes in condition.  Patient escorted via [place], and D/C home via private auto.  [name], RN 12/23/2014 2:45 PM

## 2014-12-23 NOTE — Progress Notes (Signed)
    Subjective:  Denies CP or dyspnea; no palpitations   Objective:  Filed Vitals:   12/22/14 1916 12/22/14 1919 12/22/14 2048 12/23/14 0417  BP:  133/79 137/95 129/84  Pulse:  63 85 89  Temp:  98.2 F (36.8 C) 97.5 F (36.4 C) 97.7 F (36.5 C)  TempSrc:  Oral Oral Oral  Resp:  18 18 20   Height: 6' (1.829 m)     Weight: 214 lb 3.2 oz (97.16 kg)     SpO2:  100% 100% 97%    Intake/Output from previous day:  Intake/Output Summary (Last 24 hours) at 12/23/14 0959 Last data filed at 12/23/14 0430  Gross per 24 hour  Intake 802.92 ml  Output      0 ml  Net 802.92 ml    Physical Exam: Physical exam: Well-developed well-nourished in no acute distress.  Skin is warm and dry.  HEENT is normal.  Neck is supple.  Chest is clear to auscultation with normal expansion.  Cardiovascular exam is irregular Abdominal exam nontender or distended. No masses palpated. Extremities show no edema. neuro grossly intact    Lab Results: Basic Metabolic Panel:  Recent Labs  12/20/14 2105 12/20/14 2113  NA 138 138  K 3.5 3.5  CL 100* 99*  CO2 25  --   GLUCOSE 99 94  BUN 18 23*  CREATININE 1.20 1.20  CALCIUM 9.8  --    CBC:  Recent Labs  12/20/14 2105 12/20/14 2113  WBC 9.1  --   NEUTROABS 3.8  --   HGB 16.7 18.4*  HCT 48.2 54.0*  MCV 86.7  --   PLT 154  --      Assessment/Plan:  1 atrial fibrillation-patient's heart rate is mildly elevated. Increase Cardizem to 240 mg daily. Continue apixaban. Patient is asymptomatic. He will need lifelong anticoagulation. Therefore plan rate control and anticoagulation. We will likely arrange a Holter monitor as an outpatient to make sure that rate is adequately controlled. 2 status post CVA-likely embolic from atrial fibrillation. Continue anticoagulation. 3 hypertension-continue present medications. If blood pressure decreases we could certainly decrease Avapro. 4 wall motion abnormality on echocardiogram-plan to arrange nuclear  study as an outpatient following discharge. 5 hyperlipidemia-continue statin. Patient can be discharged from a cardiac standpoint. Follow-up with me in 2-4 weeks.  Kirk Ruths 12/23/2014, 9:59 AM

## 2014-12-23 NOTE — Discharge Instructions (Signed)

## 2014-12-27 ENCOUNTER — Telehealth (HOSPITAL_COMMUNITY): Payer: Self-pay | Admitting: *Deleted

## 2014-12-27 NOTE — Telephone Encounter (Signed)
Patient given detailed instructions per Myocardial Perfusion Study Information Sheet for test on 12/29/14 at 0845. Patient Notified to arrive 15 minutes early, and that it is imperative to arrive on time for appointment to keep from having the test rescheduled. Patient verbalized understanding. [name], [name]

## 2014-12-29 ENCOUNTER — Ambulatory Visit (HOSPITAL_COMMUNITY): Payer: BLUE CROSS/BLUE SHIELD | Attending: Cardiovascular Disease

## 2014-12-29 DIAGNOSIS — R9439 Abnormal result of other cardiovascular function study: Secondary | ICD-10-CM | POA: Diagnosis not present

## 2014-12-29 DIAGNOSIS — I4891 Unspecified atrial fibrillation: Secondary | ICD-10-CM | POA: Diagnosis not present

## 2014-12-29 DIAGNOSIS — I10 Essential (primary) hypertension: Secondary | ICD-10-CM | POA: Diagnosis not present

## 2014-12-29 LAB — MYOCARDIAL PERFUSION IMAGING
LV dias vol: 139 mL
LV sys vol: 57 mL
Peak HR: 73 {beats}/min
RATE: 0.27
Rest HR: 57 {beats}/min
SDS: 1
SRS: 3
SSS: 4
TID: 1.11

## 2014-12-29 MED ORDER — REGADENOSON 0.4 MG/5ML IV SOLN
0.4000 mg | Freq: Once | INTRAVENOUS | Status: AC
  Administered 2014-12-29: 0.4 mg via INTRAVENOUS

## 2014-12-29 MED ORDER — TECHNETIUM TC 99M SESTAMIBI GENERIC - CARDIOLITE
10.2000 | Freq: Once | INTRAVENOUS | Status: AC | PRN
  Administered 2014-12-29: 10 via INTRAVENOUS

## 2014-12-29 MED ORDER — TECHNETIUM TC 99M SESTAMIBI GENERIC - CARDIOLITE
30.6000 | Freq: Once | INTRAVENOUS | Status: AC | PRN
  Administered 2014-12-29: 31 via INTRAVENOUS

## 2015-01-02 ENCOUNTER — Telehealth: Payer: Self-pay | Admitting: *Deleted

## 2015-01-02 NOTE — Telephone Encounter (Signed)
Message     Normal study    Kirk Ruths     pt aware of results

## 2015-01-05 ENCOUNTER — Encounter: Payer: Self-pay | Admitting: Cardiology

## 2015-01-05 ENCOUNTER — Ambulatory Visit (INDEPENDENT_AMBULATORY_CARE_PROVIDER_SITE_OTHER): Payer: BLUE CROSS/BLUE SHIELD | Admitting: Cardiology

## 2015-01-05 VITALS — BP 140/80 | HR 53 | Ht 72.0 in | Wt 219.8 lb

## 2015-01-05 DIAGNOSIS — I480 Paroxysmal atrial fibrillation: Secondary | ICD-10-CM | POA: Diagnosis not present

## 2015-01-05 NOTE — Patient Instructions (Signed)
Medication Instructions:  Your physician recommends that you continue on your current medications as directed. Please refer to the Current Medication list given to you today.  Labwork: NONE  Testing/Procedures: NONE  Follow-Up: Your physician recommends that you schedule a follow-up appointment in: 8 weeks with Dr. Stanford Breed.    Any Other Special Instructions Will Be Listed Below (If Applicable).

## 2015-01-05 NOTE — Progress Notes (Signed)
01/05/2015 Steven Meadows   10/01/1931  [id number]  Primary Physician Steven Screws, MD Primary Cardiologist:  Dr. Stanford Meadows   Reason for Visit/CC: Post hospital follow-up for CVA and atrial fibrillation  HPI:  Steven Meadows is a 81 y.o. male with a history of HTN, HLD, cerbebral artery aneursym, remote CVA 15 years ago who presented to the [facility] ED 12/20/14 with complaints of sudden onset of difficulty speaking. He was unable to get any words out and trouble with concentrating.He drove himself to the ED as these symptoms were similar to his previous stroke 15 years ago. CT scan of the Head and an MRI of the Brain were performed and revealed a positive acute CVA. He was not deemed a TPA candidate. He was also found to be in Atrial fibrillation at the time, which was a new diagnosis. He was placed on IV Cardizem for rate control and later transitioned to PO, 240 mg daily. 2D echo was obtained which revealed normal left ventricular systolic function with an EF of 50-55%. He was evaluated by neurology who recommended anticoagulation with Eliquis, 5 mg BID. His neurological deficits resolved prior to discharge.  He now presents to clinic for post hospital follow-up. He reports that he has done well since discharge. He denies any recurrent palpitations. No lightheadedness, dizziness syncope/near-syncope. He also denies chest pain or dyspnea. He has been fully compliant with Eliquis and denies any abnormal bleeding and no falls. He denies any stroke-like symptoms. He has no residual neurologic deficits.   Current Outpatient Prescriptions  Medication Sig Dispense Refill  . apixaban (ELIQUIS) 5 MG TABS tablet Take 1 tablet (5 mg total) by mouth 2 (two) times daily. 60 tablet 1  . chlorthalidone (HYGROTON) 25 MG tablet Take 25 mg by mouth every Monday, Wednesday, and Friday.     . diltiazem (CARDIZEM CD) 240 MG 24 hr capsule Take 1 capsule (240 mg total) by mouth daily. 30 capsule 0  . ezetimibe  (ZETIA) 10 MG tablet Take 10 mg by mouth daily.    Marland Kitchen loratadine (CLARITIN) 10 MG tablet Take 10 mg by mouth daily.    Marland Kitchen omeprazole (PRILOSEC) 20 MG capsule Take 20 mg by mouth daily.    . rosuvastatin (CRESTOR) 10 MG tablet Take 1 tablet (10 mg total) by mouth daily at 6 PM. 30 tablet 0  . senna-docusate (SENOKOT-S) 8.6-50 MG per tablet Take 1 tablet by mouth at bedtime as needed for moderate constipation.    . valsartan (DIOVAN) 320 MG tablet Take 320 mg by mouth at bedtime.      No current facility-administered medications for this visit.    Allergies  Allergen Reactions  . Nabumetone Other (See Comments)    Gi upset    Social History   Social History  . Marital Status: Married    Spouse Name: N/A  . Number of Children: N/A  . Years of Education: N/A   Occupational History  . Not on file.   Social History Main Topics  . Smoking status: Never Smoker   . Smokeless tobacco: Never Used  . Alcohol Use: 0.0 oz/week    0 Standard drinks or equivalent per week     Comment: 2 drinks per night  . Drug Use: No  . Sexual Activity: Not on file   Other Topics Concern  . Not on file   Social History Narrative     Review of Systems: General: negative for chills, fever, night sweats or weight changes.  Cardiovascular:  negative for chest pain, dyspnea on exertion, edema, orthopnea, palpitations, paroxysmal nocturnal dyspnea or shortness of breath Dermatological: negative for rash Respiratory: negative for cough or wheezing Urologic: negative for hematuria Abdominal: negative for nausea, vomiting, diarrhea, bright red blood per rectum, melena, or hematemesis Neurologic: negative for visual changes, syncope, or dizziness All other systems reviewed and are otherwise negative except as noted above.    Blood pressure 140/80, pulse 53, height 6' (1.829 m), weight 219 lb 12.8 oz (99.701 kg), SpO2 97 %.  General appearance: alert, cooperative and no distress Neck: no carotid bruit and  no JVD Lungs: clear to auscultation bilaterally Heart: regular rate and rhythm, S1, S2 normal, no murmur, click, rub or gallop Extremities: no LEE Pulses: 2+ and symmetric Skin: warm and dry Neurologic: Grossly normal    ASSESSMENT AND PLAN:   1. Paroxsymal atrial fibrillation: Physical exam is notable for regular rate and rhythm. Pulse rate is 53 bpm. The patient denies any recurrent symptoms of palpitations. He reports full medication compliance with Cardizem and Eliquis. BP is stable on Cardizem. Tolerating Eliquis w/o abnormal bleeding. Continue Cardizem for rate control and lifelong Eliquis for stroke prophylaxis.  2. Hypertension: Blood pressure is well-controlled on Cardizem and valsartan.  3. Hyperlipidemia: Continue statin therapy with Crestor and Zetia.  4. Obstructive sleep apnea: The patient reports full compliance with CPAP.   PLAN  Continue current plan of care. Follow-up with his primary cardiologist, Dr. Stanford Meadows, in 8 weeks.   Steven Jester PA-C 01/05/2015 9:43 AM

## 2015-01-22 ENCOUNTER — Ambulatory Visit (INDEPENDENT_AMBULATORY_CARE_PROVIDER_SITE_OTHER): Payer: BLUE CROSS/BLUE SHIELD | Admitting: Neurology

## 2015-01-22 ENCOUNTER — Encounter: Payer: Self-pay | Admitting: Neurology

## 2015-01-22 VITALS — BP 149/96 | HR 77 | Ht 71.0 in | Wt 222.4 lb

## 2015-01-22 DIAGNOSIS — I10 Essential (primary) hypertension: Secondary | ICD-10-CM | POA: Diagnosis not present

## 2015-01-22 DIAGNOSIS — I482 Chronic atrial fibrillation, unspecified: Secondary | ICD-10-CM

## 2015-01-22 DIAGNOSIS — Z7901 Long term (current) use of anticoagulants: Secondary | ICD-10-CM | POA: Diagnosis not present

## 2015-01-22 DIAGNOSIS — E785 Hyperlipidemia, unspecified: Secondary | ICD-10-CM

## 2015-01-22 DIAGNOSIS — I671 Cerebral aneurysm, nonruptured: Secondary | ICD-10-CM | POA: Diagnosis not present

## 2015-01-22 DIAGNOSIS — I639 Cerebral infarction, unspecified: Secondary | ICD-10-CM

## 2015-01-22 NOTE — Patient Instructions (Signed)
-   continue eliquis and crestor for stroke prevention - check BP at home and record and if constantly higher than 140, you need to call Dr. Inda Merlin - Follow up with your primary care physician for stroke risk factor modification. Recommend maintain blood pressure goal <130/80, diabetes with hemoglobin A1c goal below 6.5% and lipids with LDL cholesterol goal below 70 mg/dL.  - follow up with Dr. Ellene Route in 2 years.  - follow up in 6 months.

## 2015-01-23 DIAGNOSIS — I671 Cerebral aneurysm, nonruptured: Secondary | ICD-10-CM | POA: Insufficient documentation

## 2015-01-23 DIAGNOSIS — Z7901 Long term (current) use of anticoagulants: Secondary | ICD-10-CM | POA: Insufficient documentation

## 2015-01-23 DIAGNOSIS — E785 Hyperlipidemia, unspecified: Secondary | ICD-10-CM | POA: Insufficient documentation

## 2015-01-23 DIAGNOSIS — I10 Essential (primary) hypertension: Secondary | ICD-10-CM | POA: Insufficient documentation

## 2015-01-23 NOTE — Progress Notes (Signed)
STROKE NEUROLOGY FOLLOW UP NOTE  NAME: Steven Meadows DOB: 24-Nov-1931  REASON FOR VISIT: stroke follow up HISTORY FROM: pt and chart  Today we had the pleasure of seeing Steven Meadows in follow-up at our Neurology Clinic. Pt was accompanied by no one.   History Summary [name] is a 81 y.o. male with history of hypertension, hyperlipidemia, previous infarcts, stable vertebral artery aneurysm following with Dr. Ellene Route, presenting with confusion and speech difficulties on 12/22/14. He did not receive IV t-PA due to minimal improving deficits. Found to have Afib RVR and put on cardiazem drip. MRI showed small right splenium infarct. After discussion with Dr. Kathyrn Sheriff, he was put on eliquis for stroke prevention and also outpt follow up with Dr. Ellene Route for aneurysm monitoring. He was discharged with eliquis.  Interval History During the interval time, the patient has been doing well. No stroke like symptoms. BP 149/96 and he is on diovan and cardizem. He saw Dr. Ellene Route after discharge and was told stable fusiform aneurysm at distal right VA and vetebrobasilar junction. Next visit in 2 years. He otherwise has no complains.   REVIEW OF SYSTEMS: Full 14 system review of systems performed and notable only for those listed below and in HPI above, all others are negative:  Constitutional:   Cardiovascular: palpitations Ear/Nose/Throat:  Hearing loss Skin: moles Eyes:   Respiratory:   Gastroitestinal:   Genitourinary:  Hematology/Lymphatic:   Endocrine:  Musculoskeletal:   Allergy/Immunology:   Neurological:   Psychiatric:  Sleep:   The following represents the patient's updated allergies and side effects list: Allergies  Allergen Reactions  . Nabumetone Other (See Comments)    Gi upset    The neurologically relevant items on the patient's problem list were reviewed on today's visit.  Neurologic Examination  A problem focused neurological exam (12 or more points of the  single system neurologic examination, vital signs counts as 1 point, cranial nerves count for 8 points) was performed.  Blood pressure 149/96, pulse 77, height 5\' 11"  (1.803 m), weight 222 lb 6.4 oz (100.88 kg).  General - Well nourished, well developed, in no apparent distress.  Ophthalmologic - Fundi not visualized due to noncooperation.  Cardiovascular - irregularly irregular heart rate and rhythm  Mental Status -  Level of arousal and orientation to time, place, and person were intact. Language including expression, naming, repetition, comprehension was assessed and found intact. Fund of Knowledge was assessed and was intact.  Cranial Nerves II - XII - II - Visual field intact OU. III, IV, VI - Extraocular movements intact. V - Facial sensation intact bilaterally. VII - Facial movement intact bilaterally. VIII - Hearing & vestibular intact bilaterally. X - Palate elevates symmetrically. XI - Chin turning & shoulder shrug intact bilaterally. XII - Tongue protrusion intact.  Motor Strength - The patient's strength was normal in all extremities and pronator drift was absent.  Bulk was normal and fasciculations were absent.   Motor Tone - Muscle tone was assessed at the neck and appendages and was normal.  Reflexes - The patient's reflexes were 1+ in all extremities and he had no pathological reflexes.  Sensory - Light touch, temperature/pinprick were assessed and were normal.    Coordination - The patient had normal movements in the hands and feet with no ataxia or dysmetria.  Tremor was absent.  Gait and Station - slow cautions gait.  Data reviewed: I personally reviewed the images and agree with the radiology interpretations.  Ct  Head Wo Contrast 12/20/2014  1. Acute versus subacute nonhemorrhagic left cerebellar infarct.   Mr Steven Meadows Head Wo Contrast 12/20/2014  MRI HEAD  1. Acute 7 mm ischemic nonhemorrhagic infarct involving the right splenium without mass effect.   2. Atrophy with chronic microvascular ischemic disease and multiple small remote lacunar infarcts as above.  MRA HEAD  1. No proximal or large arterial branch occlusion identified within the intracranial circulation.  2. No hemodynamically significant or correctable stenosis identified.  3. Diminutive vertebrobasilar system, with fetal origin of the left PCA and the right PCA supplied largely via a prominent right posterior communicating artery.  4. 8 x 6 mm fusiform aneurysm arising from the distal right vertebral artery/vertebrobasilar junction.  5. Diminutive left vertebral artery terminates in PICA.   CUS - Bilateral: 1-39% ICA stenosis. Vertebral artery flow is antegrade.  2D echo - - Left ventricle: The cavity size was normal. There was mild concentric hypertrophy. Systolic function was normal. The estimated ejection fraction was in the range of 50% to 55%. There is akinesis of the basalinferior myocardium. There was no evidence of elevated ventricular filling pressure by Doppler parameters. - Left atrium: The atrium was mildly dilated. Impressions: - No cardiac source of emboli was indentified.  Component     Latest Ref Rng 12/21/2014  Cholesterol     0 - 200 mg/dL 126  Triglycerides     <150 mg/dL 75  HDL Cholesterol     >40 mg/dL 49  Total CHOL/HDL Ratio      2.6  VLDL     0 - 40 mg/dL 15  LDL (calc)     0 - 99 mg/dL 62  Hemoglobin A1C     4.8 - 5.6 % 6.5 (H)  Mean Plasma Glucose      140    Assessment: As you may recall, he is a 81 y.o. Caucasian male with PMH of hypertension, hyperlipidemia, previous infarcts, stable vertebral artery aneurysm following with Dr. Ellene Route admitted on 12/22/14 for small right splenium infarct. MRA showed stable right VA / vetebrobasilar fusiform aneurysm. He was found to have afib RVR. He was discharged with eliquis and cardizem. He followed up with Dr. Ellene Route for stable aneurysm and next visit is in 2 years. He is doing  well during the interval time.  Plan:  - continue eliquis and crestor for stroke prevention - check BP at home and record - Follow up with your primary care physician for stroke risk factor modification. Recommend maintain blood pressure goal <130/80, diabetes with hemoglobin A1c goal below 6.5% and lipids with LDL cholesterol goal below 70 mg/dL.  - follow up with Dr. Ellene Route in 2 years.  - RTC in 6 months.  No orders of the defined types were placed in this encounter.    No orders of the defined types were placed in this encounter.    Patient Instructions  - continue eliquis and crestor for stroke prevention - check BP at home and record and if constantly higher than 140, you need to call Dr. Inda Merlin - Follow up with your primary care physician for stroke risk factor modification. Recommend maintain blood pressure goal <130/80, diabetes with hemoglobin A1c goal below 6.5% and lipids with LDL cholesterol goal below 70 mg/dL.  - follow up with Dr. Ellene Route in 2 years.  - follow up in 6 months.    Rosalin Hawking, MD PhD [facility] [address] [place], [place] [address] [phone]

## 2015-03-05 NOTE — Progress Notes (Signed)
HPI: FU Atrial fibrillation, HTN, HLD, cerbebral artery aneursym, remote CVA 15 years ago. Presented to the [facility] ED 8/16 with complaints of sudden onset of difficulty speaking. CT scan of the Head and an MRI of the Brain were performed and revealed an acute CVA. He was not deemed a TPA candidate. He was also found to be in atrial fibrillation at the time, which was a new diagnosis. He was placed on IV Cardizem for rate control and later transitioned to PO, 240 mg daily. 2D echo was obtained which revealed normal left ventricular systolic function with an EF of 50-55%. Placed on apixaban. Nuclear study August 2016 showed an ejection fraction of 59%, diaphragmatic attenuation and no ischemia. Carotid Dopplers August 2016 showed 1-39% bilateral stenosis. Since last seen, the patient denies any dyspnea on exertion, orthopnea, PND, pedal edema, palpitations, syncope or chest pain.   Current Outpatient Prescriptions  Medication Sig Dispense Refill  . apixaban (ELIQUIS) 5 MG TABS tablet Take 1 tablet (5 mg total) by mouth 2 (two) times daily. 60 tablet 1  . chlorthalidone (HYGROTON) 25 MG tablet Take 25 mg by mouth every Monday, Wednesday, and Friday.     . diltiazem (CARDIZEM CD) 240 MG 24 hr capsule Take 1 capsule (240 mg total) by mouth daily. 30 capsule 0  . ezetimibe (ZETIA) 10 MG tablet Take 10 mg by mouth daily.    Marland Kitchen loratadine (CLARITIN) 10 MG tablet Take 10 mg by mouth daily.    Marland Kitchen omeprazole (PRILOSEC) 20 MG capsule Take 20 mg by mouth daily.    . rosuvastatin (CRESTOR) 10 MG tablet Take 1 tablet (10 mg total) by mouth daily at 6 PM. 30 tablet 0  . valsartan (DIOVAN) 320 MG tablet Take 320 mg by mouth at bedtime.      No current facility-administered medications for this visit.     Past Medical History  Diagnosis Date  . Cough   . Lacunar stroke ([place])   . HTN (hypertension)   . Hypercholesteremia   . 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 Low back pain   . Rotator cuff tear   .  History of mononucleosis   . Abdominal aortic atherosclerosis ([place])   . Actinic keratoses   . Olecranon bursitis   . Atrial fibrillation [facility])     Past Surgical History  Procedure Laterality Date  . Tonsillectomy and adenoidectomy    . Cataract extraction--right eye  2013    Social History   Social History  . Marital Status: Married    Spouse Name: N/A  . Number of Children: N/A  . Years of Education: N/A   Occupational History  . Not on file.   Social History Main Topics  . Smoking status: Never Smoker   . Smokeless tobacco: Never Used  . Alcohol Use: 0.0 oz/week    0 Standard drinks or equivalent per week     Comment: 2 drinks per night  . Drug Use: No  . Sexual Activity: Not on file   Other Topics Concern  . Not on file   Social History Narrative    ROS: no fevers or chills, productive cough, hemoptysis, dysphasia, odynophagia, melena, hematochezia, dysuria, hematuria, rash, seizure activity, orthopnea, PND, pedal edema, claudication. Remaining systems are negative.  Physical Exam: Well-developed well-nourished in no acute distress.  Skin is warm and dry.  HEENT is normal.  Neck is supple.  Chest is clear to auscultation with normal expansion.  Cardiovascular exam is  regular rate and rhythm.  Abdominal exam nontender or distended. No masses palpated. Extremities show no edema. neuro grossly intact

## 2015-03-08 ENCOUNTER — Ambulatory Visit (INDEPENDENT_AMBULATORY_CARE_PROVIDER_SITE_OTHER): Payer: BLUE CROSS/BLUE SHIELD | Admitting: Cardiology

## 2015-03-08 ENCOUNTER — Encounter: Payer: Self-pay | Admitting: Cardiology

## 2015-03-08 VITALS — BP 148/80 | HR 60 | Ht 71.0 in | Wt 225.0 lb

## 2015-03-08 DIAGNOSIS — I10 Essential (primary) hypertension: Secondary | ICD-10-CM

## 2015-03-08 DIAGNOSIS — I4891 Unspecified atrial fibrillation: Secondary | ICD-10-CM | POA: Diagnosis not present

## 2015-03-08 LAB — CBC
HCT: 43.5 % (ref 39.0–52.0)
Hemoglobin: 14.9 g/dL (ref 13.0–17.0)
MCH: 29.3 pg (ref 26.0–34.0)
MCHC: 34.3 g/dL (ref 30.0–36.0)
MCV: 85.5 fL (ref 78.0–100.0)
MPV: 10.9 fL (ref 8.6–12.4)
Platelets: 195 10*3/uL (ref 150–400)
RBC: 5.09 MIL/uL (ref 4.22–5.81)
RDW: 13.3 % (ref 11.5–15.5)
WBC: 5.8 10*3/uL (ref 4.0–10.5)

## 2015-03-08 LAB — BASIC METABOLIC PANEL
BUN: 11 mg/dL (ref 7–25)
CO2: 30 mmol/L (ref 20–31)
Calcium: 9.7 mg/dL (ref 8.6–10.3)
Chloride: 97 mmol/L — ABNORMAL LOW (ref 98–110)
Creat: 0.93 mg/dL (ref 0.70–1.11)
Glucose, Bld: 96 mg/dL (ref 65–99)
Potassium: 4.5 mmol/L (ref 3.5–5.3)
Sodium: 135 mmol/L (ref 135–146)

## 2015-03-08 NOTE — Patient Instructions (Signed)
Medication Instructions:   NO CHANGE  Labwork:  Your physician recommends that you HAVE LAB WORK TODAY  Testing/Procedures:  Your physician has recommended that you wear a 24 HOUR holter monitor. Holter monitors are medical devices that record the heart's electrical activity. Doctors most often use these monitors to diagnose arrhythmias. Arrhythmias are problems with the speed or rhythm of the heartbeat. The monitor is a small, portable device. You can wear one while you do your normal daily activities. This is usually used to diagnose what is causing palpitations/syncope (passing out).    Follow-Up:  Your physician wants you to follow-up in: 6 MONTHS WITH DR CRENSHAW You will receive a reminder letter in the mail two months in advance. If you don't receive a letter, please call our office to schedule the follow-up appointment.   If you need a refill on your cardiac medications before your next appointment, please call your pharmacy.    

## 2015-03-08 NOTE — Assessment & Plan Note (Signed)
Blood pressure controlled. Continue present medications. 

## 2015-03-08 NOTE — Assessment & Plan Note (Signed)
Patient remains in atrial fibrillation on examination. Continue Cardizem. Check 24-hour Holter monitor to make sure that rate is adequately controlled. Continue apixaban. Check hemoglobin and renal function.

## 2015-03-08 NOTE — Assessment & Plan Note (Signed)
Continue statin. 

## 2015-03-12 ENCOUNTER — Ambulatory Visit (INDEPENDENT_AMBULATORY_CARE_PROVIDER_SITE_OTHER): Payer: BLUE CROSS/BLUE SHIELD

## 2015-03-12 DIAGNOSIS — I4891 Unspecified atrial fibrillation: Secondary | ICD-10-CM | POA: Diagnosis not present

## 2015-03-16 ENCOUNTER — Encounter: Payer: Self-pay | Admitting: Cardiology

## 2015-03-16 NOTE — Telephone Encounter (Signed)
Pt is returning your call

## 2015-03-16 NOTE — Telephone Encounter (Signed)
This encounter was created in error - please disregard.

## 2015-07-25 ENCOUNTER — Ambulatory Visit (INDEPENDENT_AMBULATORY_CARE_PROVIDER_SITE_OTHER): Payer: BLUE CROSS/BLUE SHIELD | Admitting: Neurology

## 2015-07-25 ENCOUNTER — Encounter: Payer: Self-pay | Admitting: Neurology

## 2015-07-25 VITALS — BP 130/85 | HR 58 | Ht 71.0 in | Wt 227.6 lb

## 2015-07-25 DIAGNOSIS — I63431 Cerebral infarction due to embolism of right posterior cerebral artery: Secondary | ICD-10-CM

## 2015-07-25 DIAGNOSIS — E785 Hyperlipidemia, unspecified: Secondary | ICD-10-CM | POA: Diagnosis not present

## 2015-07-25 DIAGNOSIS — I671 Cerebral aneurysm, nonruptured: Secondary | ICD-10-CM

## 2015-07-25 DIAGNOSIS — I10 Essential (primary) hypertension: Secondary | ICD-10-CM | POA: Diagnosis not present

## 2015-07-25 DIAGNOSIS — I482 Chronic atrial fibrillation, unspecified: Secondary | ICD-10-CM

## 2015-07-25 DIAGNOSIS — Z7901 Long term (current) use of anticoagulants: Secondary | ICD-10-CM | POA: Diagnosis not present

## 2015-07-25 HISTORY — DX: Cerebral infarction due to embolism of right posterior cerebral artery: I63.431

## 2015-07-25 NOTE — Progress Notes (Signed)
STROKE NEUROLOGY FOLLOW UP NOTE  NAME: Steven Meadows DOB: 1931/12/16  REASON FOR VISIT: stroke follow up HISTORY FROM: pt and chart  Today we had the pleasure of seeing Steven Meadows in follow-up at our Neurology Clinic. Pt was accompanied by no one.   History Summary Steven Meadows is a 82 y.o. male with history of hypertension, hyperlipidemia, previous infarcts, stable vertebral artery aneurysm following with Dr. Ellene Route, presenting with confusion and speech difficulties on 12/22/14. He did not receive IV t-PA due to minimal improving deficits. Found to have Afib RVR and put on cardiazem drip. MRI showed small right splenium infarct. After discussion with Dr. Kathyrn Sheriff, he was put on eliquis for stroke prevention and also outpt follow up with Dr. Ellene Route for aneurysm monitoring. He was discharged with eliquis.  01/22/2015 follow-up - the patient has been doing well. No stroke like symptoms. BP 149/96 and he is on diovan and cardizem. He saw Dr. Ellene Route after discharge and was told stable fusiform aneurysm at distal right VA and vetebrobasilar junction. Next visit in 2 years. He otherwise has no complains.   Interval History During the interval time, the patient has been doing well. No stroke like symptoms. Has follow-up with Dr.Crenshaw, had a 24-hour Holter monitoring, showed heart rate in good control. BP today 130/85 in clinic. Continued on an increase, Crestor, Cardizem. No other complaints.  REVIEW OF SYSTEMS: Full 14 system review of systems performed and notable only for those listed below and in HPI above, all others are negative:  Constitutional:   Cardiovascular: Ear/Nose/Throat:  Skin:  Eyes:  eye itching  Respiratory:   Gastroitestinal:   Genitourinary:  Hematology/Lymphatic:   Endocrine:  Musculoskeletal:   Allergy/Immunology:   Neurological:   Psychiatric:  Sleep:   The following represents the patient's updated allergies and side effects list: Allergies    Allergen Reactions  . Nabumetone Other (See Comments)    Gi upset    The neurologically relevant items on the patient's problem list were reviewed on today's visit.  Neurologic Examination  A problem focused neurological exam (12 or more points of the single system neurologic examination, vital signs counts as 1 point, cranial nerves count for 8 points) was performed.  Blood pressure 130/85, pulse 58, height 5\' 11"  (1.803 m), weight 227 lb 9.6 oz (103.239 kg), SpO2 97 %.  General - Well nourished, well developed, in no apparent distress.  Ophthalmologic - Fundi not visualized due to noncooperation.  Cardiovascular - irregularly irregular heart rate and rhythm  Mental Status -  Level of arousal and orientation to time, place, and person were intact. Language including expression, naming, repetition, comprehension was assessed and found intact. Fund of Knowledge was assessed and was intact.  Cranial Nerves II - XII - II - Visual field intact OU. III, IV, VI - Extraocular movements intact. V - Facial sensation intact bilaterally. VII - right nasolabial fold flattening, but could be baseline as pt not feel any difference. VIII - Hearing & vestibular intact bilaterally. X - Palate elevates symmetrically. XI - Chin turning & shoulder shrug intact bilaterally. XII - Tongue protrusion intact.  Motor Strength - The patient's strength was normal in all extremities and pronator drift was absent.  Bulk was normal and fasciculations were absent.   Motor Tone - Muscle tone was assessed at the neck and appendages and was normal.  Reflexes - The patient's reflexes were 1+ in all extremities and he had no pathological reflexes.  Sensory -  Light touch, temperature/pinprick were assessed and were normal.    Coordination - The patient had normal movements in the hands and feet with no ataxia or dysmetria.  Tremor was absent.  Gait and Station - slow cautions gait.  Data reviewed: I  personally reviewed the images and agree with the radiology interpretations.  Ct Head Wo Contrast 12/20/2014  1. Acute versus subacute nonhemorrhagic left cerebellar infarct.   Steven Meadows Head Wo Contrast 12/20/2014  MRI HEAD  1. Acute 7 mm ischemic nonhemorrhagic infarct involving the right splenium without mass effect.  2. Atrophy with chronic microvascular ischemic disease and multiple small remote lacunar infarcts as above.  MRA HEAD  1. No proximal or large arterial branch occlusion identified within the intracranial circulation.  2. No hemodynamically significant or correctable stenosis identified.  3. Diminutive vertebrobasilar system, with fetal origin of the left PCA and the right PCA supplied largely via a prominent right posterior communicating artery.  4. 8 x 6 mm fusiform aneurysm arising from the distal right vertebral artery/vertebrobasilar junction.  5. Diminutive left vertebral artery terminates in PICA.   CUS - Bilateral: 1-39% ICA stenosis. Vertebral artery flow is antegrade.  2D echo - - Left ventricle: The cavity size was normal. There was mild concentric hypertrophy. Systolic function was normal. The estimated ejection fraction was in the range of 50% to 55%. There is akinesis of the basalinferior myocardium. There was no evidence of elevated ventricular filling pressure by Doppler parameters. - Left atrium: The atrium was mildly dilated. Impressions: - No cardiac source of emboli was indentified.  Component     Latest Ref Rng 12/21/2014  Cholesterol     0 - 200 mg/dL 126  Triglycerides     <150 mg/dL 75  HDL Cholesterol     >40 mg/dL 49  Total CHOL/HDL Ratio      2.6  VLDL     0 - 40 mg/dL 15  LDL (calc)     0 - 99 mg/dL 62  Hemoglobin A1C     4.8 - 5.6 % 6.5 (H)  Mean Plasma Glucose      140    Assessment: As you may recall, he is a 82 y.o. Caucasian male with PMH of hypertension, hyperlipidemia, previous infarcts, stable  vertebral artery aneurysm following with Dr. Ellene Route admitted on 12/22/14 for small right splenium infarct. MRA showed stable right VA / vetebrobasilar fusiform aneurysm. He was found to have afib RVR. He was discharged with eliquis and cardizem. He followed up with Dr. Ellene Route for stable aneurysm and next visit in 2 years. He is doing well during the interval time.  Plan:  - continue eliquis and crestor for stroke prevention - check BP at home and record - Follow up with your primary care physician for stroke risk factor modification. Recommend maintain blood pressure goal <130/80, diabetes with hemoglobin A1c goal below 6.5% and lipids with LDL cholesterol goal below 70 mg/dL.  - avoid fall  - follow up with Dr. Ellene Route in 1.5 years.  - follow up as needed.  No orders of the defined types were placed in this encounter.    Meds ordered this encounter  Medications  . omeprazole (PRILOSEC) 40 MG capsule    Sig: Take 40 mg by mouth daily.    Patient Instructions  - continue eliquis and crestor for stroke prevention - check BP at home and record - Follow up with your primary care physician for stroke risk factor modification. Recommend maintain blood pressure  goal <130/80, diabetes with hemoglobin A1c goal below 6.5% and lipids with LDL cholesterol goal below 70 mg/dL.  - avoid fall  - follow up with Dr. Ellene Route in 1.5 years.  - follow up as needed.    Rosalin Hawking, MD PhD [facility] [address], [place], [place] [address] [phone]

## 2015-07-25 NOTE — Patient Instructions (Addendum)
-   continue eliquis and crestor for stroke prevention - check BP at home and record - Follow up with your primary care physician for stroke risk factor modification. Recommend maintain blood pressure goal <130/80, diabetes with hemoglobin A1c goal below 6.5% and lipids with LDL cholesterol goal below 70 mg/dL.  - avoid fall  - follow up with Dr. Ellene Route in 1.5 years.  - follow up as needed.

## 2015-09-20 DIAGNOSIS — L218 Other seborrheic dermatitis: Secondary | ICD-10-CM | POA: Diagnosis not present

## 2015-09-20 DIAGNOSIS — L570 Actinic keratosis: Secondary | ICD-10-CM | POA: Diagnosis not present

## 2015-09-20 DIAGNOSIS — D485 Neoplasm of uncertain behavior of skin: Secondary | ICD-10-CM | POA: Diagnosis not present

## 2015-09-20 DIAGNOSIS — L812 Freckles: Secondary | ICD-10-CM | POA: Diagnosis not present

## 2015-09-20 DIAGNOSIS — Z85828 Personal history of other malignant neoplasm of skin: Secondary | ICD-10-CM | POA: Diagnosis not present

## 2015-09-20 DIAGNOSIS — L821 Other seborrheic keratosis: Secondary | ICD-10-CM | POA: Diagnosis not present

## 2015-10-11 DIAGNOSIS — Z85828 Personal history of other malignant neoplasm of skin: Secondary | ICD-10-CM | POA: Diagnosis not present

## 2015-10-11 DIAGNOSIS — B351 Tinea unguium: Secondary | ICD-10-CM | POA: Diagnosis not present

## 2015-10-15 DIAGNOSIS — H532 Diplopia: Secondary | ICD-10-CM | POA: Diagnosis not present

## 2015-10-15 DIAGNOSIS — H5211 Myopia, right eye: Secondary | ICD-10-CM | POA: Diagnosis not present

## 2015-10-15 DIAGNOSIS — H26491 Other secondary cataract, right eye: Secondary | ICD-10-CM | POA: Diagnosis not present

## 2015-10-29 DIAGNOSIS — K219 Gastro-esophageal reflux disease without esophagitis: Secondary | ICD-10-CM | POA: Diagnosis not present

## 2015-10-29 DIAGNOSIS — N5201 Erectile dysfunction due to arterial insufficiency: Secondary | ICD-10-CM | POA: Diagnosis not present

## 2015-10-29 DIAGNOSIS — I10 Essential (primary) hypertension: Secondary | ICD-10-CM | POA: Diagnosis not present

## 2015-10-29 DIAGNOSIS — Z0001 Encounter for general adult medical examination with abnormal findings: Secondary | ICD-10-CM | POA: Diagnosis not present

## 2015-10-29 DIAGNOSIS — R7301 Impaired fasting glucose: Secondary | ICD-10-CM | POA: Diagnosis not present

## 2015-10-29 DIAGNOSIS — R609 Edema, unspecified: Secondary | ICD-10-CM | POA: Diagnosis not present

## 2015-10-29 DIAGNOSIS — I4891 Unspecified atrial fibrillation: Secondary | ICD-10-CM | POA: Diagnosis not present

## 2015-10-29 DIAGNOSIS — E7800 Pure hypercholesterolemia, unspecified: Secondary | ICD-10-CM | POA: Diagnosis not present

## 2015-10-29 DIAGNOSIS — B351 Tinea unguium: Secondary | ICD-10-CM | POA: Diagnosis not present

## 2015-10-29 DIAGNOSIS — H6120 Impacted cerumen, unspecified ear: Secondary | ICD-10-CM | POA: Diagnosis not present

## 2015-10-29 DIAGNOSIS — Z79899 Other long term (current) drug therapy: Secondary | ICD-10-CM | POA: Diagnosis not present

## 2015-10-29 DIAGNOSIS — I671 Cerebral aneurysm, nonruptured: Secondary | ICD-10-CM | POA: Diagnosis not present

## 2015-11-14 DIAGNOSIS — Z79899 Other long term (current) drug therapy: Secondary | ICD-10-CM | POA: Diagnosis not present

## 2015-11-14 DIAGNOSIS — B351 Tinea unguium: Secondary | ICD-10-CM | POA: Diagnosis not present

## 2015-11-29 DIAGNOSIS — Z79899 Other long term (current) drug therapy: Secondary | ICD-10-CM | POA: Diagnosis not present

## 2015-11-29 DIAGNOSIS — B351 Tinea unguium: Secondary | ICD-10-CM | POA: Diagnosis not present

## 2015-12-14 DIAGNOSIS — Z79899 Other long term (current) drug therapy: Secondary | ICD-10-CM | POA: Diagnosis not present

## 2015-12-24 DIAGNOSIS — D485 Neoplasm of uncertain behavior of skin: Secondary | ICD-10-CM | POA: Diagnosis not present

## 2015-12-24 DIAGNOSIS — C44311 Basal cell carcinoma of skin of nose: Secondary | ICD-10-CM | POA: Diagnosis not present

## 2015-12-24 DIAGNOSIS — L821 Other seborrheic keratosis: Secondary | ICD-10-CM | POA: Diagnosis not present

## 2015-12-24 DIAGNOSIS — Z85828 Personal history of other malignant neoplasm of skin: Secondary | ICD-10-CM | POA: Diagnosis not present

## 2015-12-25 DIAGNOSIS — R600 Localized edema: Secondary | ICD-10-CM | POA: Diagnosis not present

## 2015-12-25 DIAGNOSIS — R05 Cough: Secondary | ICD-10-CM | POA: Diagnosis not present

## 2015-12-25 DIAGNOSIS — Z79899 Other long term (current) drug therapy: Secondary | ICD-10-CM | POA: Diagnosis not present

## 2015-12-25 DIAGNOSIS — B351 Tinea unguium: Secondary | ICD-10-CM | POA: Diagnosis not present

## 2016-01-09 DIAGNOSIS — C44311 Basal cell carcinoma of skin of nose: Secondary | ICD-10-CM | POA: Diagnosis not present

## 2016-01-09 DIAGNOSIS — Z85828 Personal history of other malignant neoplasm of skin: Secondary | ICD-10-CM | POA: Diagnosis not present

## 2016-01-16 DIAGNOSIS — G4733 Obstructive sleep apnea (adult) (pediatric): Secondary | ICD-10-CM | POA: Diagnosis not present

## 2016-01-22 DIAGNOSIS — T887XXA Unspecified adverse effect of drug or medicament, initial encounter: Secondary | ICD-10-CM | POA: Diagnosis not present

## 2016-01-22 DIAGNOSIS — R269 Unspecified abnormalities of gait and mobility: Secondary | ICD-10-CM | POA: Diagnosis not present

## 2016-01-22 DIAGNOSIS — B351 Tinea unguium: Secondary | ICD-10-CM | POA: Diagnosis not present

## 2016-01-22 DIAGNOSIS — G4710 Hypersomnia, unspecified: Secondary | ICD-10-CM | POA: Diagnosis not present

## 2016-01-23 ENCOUNTER — Other Ambulatory Visit: Payer: Self-pay | Admitting: Internal Medicine

## 2016-01-23 ENCOUNTER — Ambulatory Visit
Admission: RE | Admit: 2016-01-23 | Discharge: 2016-01-23 | Disposition: A | Discharge status: Home / Self Care | Payer: Medicare Other | Source: Ambulatory Visit | Attending: Internal Medicine | Admitting: Internal Medicine

## 2016-01-23 DIAGNOSIS — G4710 Hypersomnia, unspecified: Secondary | ICD-10-CM

## 2016-01-23 DIAGNOSIS — B351 Tinea unguium: Secondary | ICD-10-CM | POA: Diagnosis not present

## 2016-01-23 DIAGNOSIS — R930 Abnormal findings on diagnostic imaging of skull and head, not elsewhere classified: Secondary | ICD-10-CM | POA: Diagnosis not present

## 2016-01-23 DIAGNOSIS — T887XXA Unspecified adverse effect of drug or medicament, initial encounter: Secondary | ICD-10-CM | POA: Diagnosis not present

## 2016-01-23 DIAGNOSIS — R269 Unspecified abnormalities of gait and mobility: Secondary | ICD-10-CM | POA: Diagnosis not present

## 2016-01-28 DIAGNOSIS — Z85828 Personal history of other malignant neoplasm of skin: Secondary | ICD-10-CM | POA: Diagnosis not present

## 2016-01-28 DIAGNOSIS — L821 Other seborrheic keratosis: Secondary | ICD-10-CM | POA: Diagnosis not present

## 2016-01-28 DIAGNOSIS — B351 Tinea unguium: Secondary | ICD-10-CM | POA: Diagnosis not present

## 2016-04-14 DIAGNOSIS — Z85828 Personal history of other malignant neoplasm of skin: Secondary | ICD-10-CM | POA: Diagnosis not present

## 2016-04-14 DIAGNOSIS — L570 Actinic keratosis: Secondary | ICD-10-CM | POA: Diagnosis not present

## 2016-04-14 DIAGNOSIS — L821 Other seborrheic keratosis: Secondary | ICD-10-CM | POA: Diagnosis not present

## 2016-04-14 DIAGNOSIS — C44612 Basal cell carcinoma of skin of right upper limb, including shoulder: Secondary | ICD-10-CM | POA: Diagnosis not present

## 2016-04-14 DIAGNOSIS — L218 Other seborrheic dermatitis: Secondary | ICD-10-CM | POA: Diagnosis not present

## 2016-04-14 DIAGNOSIS — D485 Neoplasm of uncertain behavior of skin: Secondary | ICD-10-CM | POA: Diagnosis not present

## 2016-04-14 DIAGNOSIS — C44729 Squamous cell carcinoma of skin of left lower limb, including hip: Secondary | ICD-10-CM | POA: Diagnosis not present

## 2016-04-14 DIAGNOSIS — L812 Freckles: Secondary | ICD-10-CM | POA: Diagnosis not present

## 2016-05-02 DIAGNOSIS — R531 Weakness: Secondary | ICD-10-CM | POA: Diagnosis not present

## 2016-05-02 DIAGNOSIS — R8290 Unspecified abnormal findings in urine: Secondary | ICD-10-CM | POA: Diagnosis not present

## 2016-05-02 DIAGNOSIS — M545 Low back pain: Secondary | ICD-10-CM | POA: Diagnosis not present

## 2016-05-29 DIAGNOSIS — K219 Gastro-esophageal reflux disease without esophagitis: Secondary | ICD-10-CM | POA: Diagnosis not present

## 2016-05-29 DIAGNOSIS — B351 Tinea unguium: Secondary | ICD-10-CM | POA: Diagnosis not present

## 2016-05-29 DIAGNOSIS — R7301 Impaired fasting glucose: Secondary | ICD-10-CM | POA: Diagnosis not present

## 2016-05-29 DIAGNOSIS — M545 Low back pain: Secondary | ICD-10-CM | POA: Diagnosis not present

## 2016-05-29 DIAGNOSIS — I639 Cerebral infarction, unspecified: Secondary | ICD-10-CM | POA: Diagnosis not present

## 2016-05-29 DIAGNOSIS — I4891 Unspecified atrial fibrillation: Secondary | ICD-10-CM | POA: Diagnosis not present

## 2016-05-29 DIAGNOSIS — G4733 Obstructive sleep apnea (adult) (pediatric): Secondary | ICD-10-CM | POA: Diagnosis not present

## 2016-05-29 DIAGNOSIS — Z79899 Other long term (current) drug therapy: Secondary | ICD-10-CM | POA: Diagnosis not present

## 2016-09-18 DIAGNOSIS — I4891 Unspecified atrial fibrillation: Secondary | ICD-10-CM | POA: Diagnosis not present

## 2016-09-18 DIAGNOSIS — Z79899 Other long term (current) drug therapy: Secondary | ICD-10-CM | POA: Diagnosis not present

## 2016-09-18 DIAGNOSIS — M25473 Effusion, unspecified ankle: Secondary | ICD-10-CM | POA: Diagnosis not present

## 2016-09-18 DIAGNOSIS — R3915 Urgency of urination: Secondary | ICD-10-CM | POA: Diagnosis not present

## 2016-09-18 DIAGNOSIS — M549 Dorsalgia, unspecified: Secondary | ICD-10-CM | POA: Diagnosis not present

## 2016-09-18 DIAGNOSIS — R600 Localized edema: Secondary | ICD-10-CM | POA: Diagnosis not present

## 2016-09-18 DIAGNOSIS — S29011A Strain of muscle and tendon of front wall of thorax, initial encounter: Secondary | ICD-10-CM | POA: Diagnosis not present

## 2016-10-13 DIAGNOSIS — L853 Xerosis cutis: Secondary | ICD-10-CM | POA: Diagnosis not present

## 2016-10-13 DIAGNOSIS — L570 Actinic keratosis: Secondary | ICD-10-CM | POA: Diagnosis not present

## 2016-10-13 DIAGNOSIS — L821 Other seborrheic keratosis: Secondary | ICD-10-CM | POA: Diagnosis not present

## 2016-10-13 DIAGNOSIS — Z85828 Personal history of other malignant neoplasm of skin: Secondary | ICD-10-CM | POA: Diagnosis not present

## 2016-10-13 DIAGNOSIS — C44722 Squamous cell carcinoma of skin of right lower limb, including hip: Secondary | ICD-10-CM | POA: Diagnosis not present

## 2016-10-13 DIAGNOSIS — D485 Neoplasm of uncertain behavior of skin: Secondary | ICD-10-CM | POA: Diagnosis not present

## 2016-10-13 DIAGNOSIS — L812 Freckles: Secondary | ICD-10-CM | POA: Diagnosis not present

## 2016-10-13 DIAGNOSIS — L820 Inflamed seborrheic keratosis: Secondary | ICD-10-CM | POA: Diagnosis not present

## 2016-10-17 DIAGNOSIS — E871 Hypo-osmolality and hyponatremia: Secondary | ICD-10-CM | POA: Diagnosis not present

## 2016-10-17 DIAGNOSIS — I10 Essential (primary) hypertension: Secondary | ICD-10-CM | POA: Diagnosis not present

## 2016-10-27 DIAGNOSIS — H26493 Other secondary cataract, bilateral: Secondary | ICD-10-CM | POA: Diagnosis not present

## 2016-10-27 DIAGNOSIS — H5211 Myopia, right eye: Secondary | ICD-10-CM | POA: Diagnosis not present

## 2016-10-31 DIAGNOSIS — J329 Chronic sinusitis, unspecified: Secondary | ICD-10-CM | POA: Diagnosis not present

## 2016-11-20 ENCOUNTER — Other Ambulatory Visit: Payer: Self-pay | Admitting: Neurological Surgery

## 2016-11-20 DIAGNOSIS — I671 Cerebral aneurysm, nonruptured: Secondary | ICD-10-CM

## 2016-11-20 DIAGNOSIS — Z0000 Encounter for general adult medical examination without abnormal findings: Secondary | ICD-10-CM | POA: Diagnosis not present

## 2016-11-27 DIAGNOSIS — I10 Essential (primary) hypertension: Secondary | ICD-10-CM | POA: Diagnosis not present

## 2016-12-23 DIAGNOSIS — R600 Localized edema: Secondary | ICD-10-CM | POA: Diagnosis not present

## 2016-12-23 DIAGNOSIS — G629 Polyneuropathy, unspecified: Secondary | ICD-10-CM | POA: Diagnosis not present

## 2016-12-23 DIAGNOSIS — I10 Essential (primary) hypertension: Secondary | ICD-10-CM | POA: Diagnosis not present

## 2016-12-23 DIAGNOSIS — E559 Vitamin D deficiency, unspecified: Secondary | ICD-10-CM | POA: Diagnosis not present

## 2016-12-23 DIAGNOSIS — E871 Hypo-osmolality and hyponatremia: Secondary | ICD-10-CM | POA: Diagnosis not present

## 2016-12-23 DIAGNOSIS — I4891 Unspecified atrial fibrillation: Secondary | ICD-10-CM | POA: Diagnosis not present

## 2016-12-23 DIAGNOSIS — R7301 Impaired fasting glucose: Secondary | ICD-10-CM | POA: Diagnosis not present

## 2016-12-23 DIAGNOSIS — Z79899 Other long term (current) drug therapy: Secondary | ICD-10-CM | POA: Diagnosis not present

## 2016-12-23 DIAGNOSIS — R609 Edema, unspecified: Secondary | ICD-10-CM | POA: Diagnosis not present

## 2017-01-15 DIAGNOSIS — G4733 Obstructive sleep apnea (adult) (pediatric): Secondary | ICD-10-CM | POA: Diagnosis not present

## 2017-01-30 DIAGNOSIS — Z23 Encounter for immunization: Secondary | ICD-10-CM | POA: Diagnosis not present

## 2017-02-27 DIAGNOSIS — R600 Localized edema: Secondary | ICD-10-CM | POA: Diagnosis not present

## 2017-02-27 DIAGNOSIS — I10 Essential (primary) hypertension: Secondary | ICD-10-CM | POA: Diagnosis not present

## 2017-02-27 DIAGNOSIS — L299 Pruritus, unspecified: Secondary | ICD-10-CM | POA: Diagnosis not present

## 2017-03-10 DIAGNOSIS — L299 Pruritus, unspecified: Secondary | ICD-10-CM | POA: Diagnosis not present

## 2017-03-10 DIAGNOSIS — I10 Essential (primary) hypertension: Secondary | ICD-10-CM | POA: Diagnosis not present

## 2017-03-10 DIAGNOSIS — H6123 Impacted cerumen, bilateral: Secondary | ICD-10-CM | POA: Diagnosis not present

## 2017-04-16 DIAGNOSIS — Z85828 Personal history of other malignant neoplasm of skin: Secondary | ICD-10-CM | POA: Diagnosis not present

## 2017-04-16 DIAGNOSIS — L820 Inflamed seborrheic keratosis: Secondary | ICD-10-CM | POA: Diagnosis not present

## 2017-04-16 DIAGNOSIS — L812 Freckles: Secondary | ICD-10-CM | POA: Diagnosis not present

## 2017-04-16 DIAGNOSIS — L814 Other melanin hyperpigmentation: Secondary | ICD-10-CM | POA: Diagnosis not present

## 2017-04-16 DIAGNOSIS — L821 Other seborrheic keratosis: Secondary | ICD-10-CM | POA: Diagnosis not present

## 2017-04-16 DIAGNOSIS — L570 Actinic keratosis: Secondary | ICD-10-CM | POA: Diagnosis not present

## 2017-04-17 DIAGNOSIS — I10 Essential (primary) hypertension: Secondary | ICD-10-CM | POA: Diagnosis not present

## 2017-04-17 DIAGNOSIS — E669 Obesity, unspecified: Secondary | ICD-10-CM | POA: Diagnosis not present

## 2017-04-17 DIAGNOSIS — Z6831 Body mass index (BMI) 31.0-31.9, adult: Secondary | ICD-10-CM | POA: Diagnosis not present

## 2017-04-17 DIAGNOSIS — Z1389 Encounter for screening for other disorder: Secondary | ICD-10-CM | POA: Diagnosis not present

## 2017-04-17 DIAGNOSIS — L299 Pruritus, unspecified: Secondary | ICD-10-CM | POA: Diagnosis not present

## 2017-10-06 DIAGNOSIS — L299 Pruritus, unspecified: Secondary | ICD-10-CM | POA: Diagnosis not present

## 2017-10-06 DIAGNOSIS — I10 Essential (primary) hypertension: Secondary | ICD-10-CM | POA: Diagnosis not present

## 2017-10-06 DIAGNOSIS — H6120 Impacted cerumen, unspecified ear: Secondary | ICD-10-CM | POA: Diagnosis not present

## 2017-10-06 DIAGNOSIS — R600 Localized edema: Secondary | ICD-10-CM | POA: Diagnosis not present

## 2017-11-02 DIAGNOSIS — L570 Actinic keratosis: Secondary | ICD-10-CM | POA: Diagnosis not present

## 2017-11-02 DIAGNOSIS — L812 Freckles: Secondary | ICD-10-CM | POA: Diagnosis not present

## 2017-11-02 DIAGNOSIS — D692 Other nonthrombocytopenic purpura: Secondary | ICD-10-CM | POA: Diagnosis not present

## 2017-11-02 DIAGNOSIS — Z85828 Personal history of other malignant neoplasm of skin: Secondary | ICD-10-CM | POA: Diagnosis not present

## 2017-11-02 DIAGNOSIS — L821 Other seborrheic keratosis: Secondary | ICD-10-CM | POA: Diagnosis not present

## 2017-11-02 DIAGNOSIS — L218 Other seborrheic dermatitis: Secondary | ICD-10-CM | POA: Diagnosis not present

## 2017-11-02 DIAGNOSIS — D225 Melanocytic nevi of trunk: Secondary | ICD-10-CM | POA: Diagnosis not present

## 2017-11-10 DIAGNOSIS — H26491 Other secondary cataract, right eye: Secondary | ICD-10-CM | POA: Diagnosis not present

## 2017-11-10 DIAGNOSIS — Z961 Presence of intraocular lens: Secondary | ICD-10-CM | POA: Diagnosis not present

## 2017-11-10 DIAGNOSIS — H5211 Myopia, right eye: Secondary | ICD-10-CM | POA: Diagnosis not present

## 2017-11-30 DIAGNOSIS — B351 Tinea unguium: Secondary | ICD-10-CM | POA: Diagnosis not present

## 2017-11-30 DIAGNOSIS — I10 Essential (primary) hypertension: Secondary | ICD-10-CM | POA: Diagnosis not present

## 2017-11-30 DIAGNOSIS — R600 Localized edema: Secondary | ICD-10-CM | POA: Diagnosis not present

## 2017-12-17 DIAGNOSIS — J069 Acute upper respiratory infection, unspecified: Secondary | ICD-10-CM | POA: Diagnosis not present

## 2018-01-18 DIAGNOSIS — G4733 Obstructive sleep apnea (adult) (pediatric): Secondary | ICD-10-CM | POA: Diagnosis not present

## 2018-01-26 DIAGNOSIS — K219 Gastro-esophageal reflux disease without esophagitis: Secondary | ICD-10-CM | POA: Diagnosis not present

## 2018-01-26 DIAGNOSIS — J309 Allergic rhinitis, unspecified: Secondary | ICD-10-CM | POA: Diagnosis not present

## 2018-01-26 DIAGNOSIS — R600 Localized edema: Secondary | ICD-10-CM | POA: Diagnosis not present

## 2018-01-26 DIAGNOSIS — Z0001 Encounter for general adult medical examination with abnormal findings: Secondary | ICD-10-CM | POA: Diagnosis not present

## 2018-01-26 DIAGNOSIS — I10 Essential (primary) hypertension: Secondary | ICD-10-CM | POA: Diagnosis not present

## 2018-01-26 DIAGNOSIS — I639 Cerebral infarction, unspecified: Secondary | ICD-10-CM | POA: Diagnosis not present

## 2018-01-26 DIAGNOSIS — I4891 Unspecified atrial fibrillation: Secondary | ICD-10-CM | POA: Diagnosis not present

## 2018-01-26 DIAGNOSIS — Z23 Encounter for immunization: Secondary | ICD-10-CM | POA: Diagnosis not present

## 2018-01-26 DIAGNOSIS — Z79899 Other long term (current) drug therapy: Secondary | ICD-10-CM | POA: Diagnosis not present

## 2018-01-26 DIAGNOSIS — R7301 Impaired fasting glucose: Secondary | ICD-10-CM | POA: Diagnosis not present

## 2018-02-03 DIAGNOSIS — Z23 Encounter for immunization: Secondary | ICD-10-CM | POA: Diagnosis not present

## 2018-02-25 DIAGNOSIS — H26491 Other secondary cataract, right eye: Secondary | ICD-10-CM | POA: Diagnosis not present

## 2018-05-27 DIAGNOSIS — L812 Freckles: Secondary | ICD-10-CM | POA: Diagnosis not present

## 2018-05-27 DIAGNOSIS — Z85828 Personal history of other malignant neoplasm of skin: Secondary | ICD-10-CM | POA: Diagnosis not present

## 2018-05-27 DIAGNOSIS — L111 Transient acantholytic dermatosis [Grover]: Secondary | ICD-10-CM | POA: Diagnosis not present

## 2018-05-27 DIAGNOSIS — L570 Actinic keratosis: Secondary | ICD-10-CM | POA: Diagnosis not present

## 2018-05-27 DIAGNOSIS — L821 Other seborrheic keratosis: Secondary | ICD-10-CM | POA: Diagnosis not present

## 2018-05-27 DIAGNOSIS — L218 Other seborrheic dermatitis: Secondary | ICD-10-CM | POA: Diagnosis not present

## 2018-09-03 ENCOUNTER — Other Ambulatory Visit: Payer: Self-pay

## 2018-09-03 ENCOUNTER — Inpatient Hospital Stay (HOSPITAL_COMMUNITY): Payer: Medicare Other

## 2018-09-03 ENCOUNTER — Inpatient Hospital Stay (HOSPITAL_COMMUNITY)
Admission: EM | Admit: 2018-09-03 | Discharge: 2018-09-05 | DRG: 065 | Disposition: A | Discharge status: Home Health | Payer: Medicare Other | Attending: Internal Medicine | Admitting: Internal Medicine

## 2018-09-03 ENCOUNTER — Emergency Department (HOSPITAL_COMMUNITY): Payer: Medicare Other

## 2018-09-03 ENCOUNTER — Encounter (HOSPITAL_COMMUNITY): Payer: Self-pay | Admitting: Emergency Medicine

## 2018-09-03 DIAGNOSIS — I726 Aneurysm of vertebral artery: Secondary | ICD-10-CM | POA: Diagnosis not present

## 2018-09-03 DIAGNOSIS — I63311 Cerebral infarction due to thrombosis of right middle cerebral artery: Secondary | ICD-10-CM

## 2018-09-03 DIAGNOSIS — Z888 Allergy status to other drugs, medicaments and biological substances status: Secondary | ICD-10-CM

## 2018-09-03 DIAGNOSIS — I639 Cerebral infarction, unspecified: Secondary | ICD-10-CM | POA: Diagnosis not present

## 2018-09-03 DIAGNOSIS — I10 Essential (primary) hypertension: Secondary | ICD-10-CM | POA: Diagnosis not present

## 2018-09-03 DIAGNOSIS — I6389 Other cerebral infarction: Principal | ICD-10-CM | POA: Diagnosis present

## 2018-09-03 DIAGNOSIS — Z9841 Cataract extraction status, right eye: Secondary | ICD-10-CM

## 2018-09-03 DIAGNOSIS — W1830XA Fall on same level, unspecified, initial encounter: Secondary | ICD-10-CM | POA: Diagnosis present

## 2018-09-03 DIAGNOSIS — Z8249 Family history of ischemic heart disease and other diseases of the circulatory system: Secondary | ICD-10-CM

## 2018-09-03 DIAGNOSIS — Z809 Family history of malignant neoplasm, unspecified: Secondary | ICD-10-CM | POA: Diagnosis not present

## 2018-09-03 DIAGNOSIS — I4811 Longstanding persistent atrial fibrillation: Secondary | ICD-10-CM | POA: Diagnosis not present

## 2018-09-03 DIAGNOSIS — I700 Atherosclerosis of aorta: Secondary | ICD-10-CM | POA: Diagnosis present

## 2018-09-03 DIAGNOSIS — I4891 Unspecified atrial fibrillation: Secondary | ICD-10-CM | POA: Diagnosis present

## 2018-09-03 DIAGNOSIS — I482 Chronic atrial fibrillation, unspecified: Secondary | ICD-10-CM | POA: Diagnosis present

## 2018-09-03 DIAGNOSIS — R202 Paresthesia of skin: Secondary | ICD-10-CM | POA: Diagnosis not present

## 2018-09-03 DIAGNOSIS — R29700 NIHSS score 0: Secondary | ICD-10-CM | POA: Diagnosis present

## 2018-09-03 DIAGNOSIS — E785 Hyperlipidemia, unspecified: Secondary | ICD-10-CM | POA: Diagnosis present

## 2018-09-03 DIAGNOSIS — I4510 Unspecified right bundle-branch block: Secondary | ICD-10-CM | POA: Diagnosis present

## 2018-09-03 DIAGNOSIS — Z8673 Personal history of transient ischemic attack (TIA), and cerebral infarction without residual deficits: Secondary | ICD-10-CM | POA: Diagnosis not present

## 2018-09-03 DIAGNOSIS — I340 Nonrheumatic mitral (valve) insufficiency: Secondary | ICD-10-CM | POA: Diagnosis not present

## 2018-09-03 DIAGNOSIS — N179 Acute kidney failure, unspecified: Secondary | ICD-10-CM | POA: Diagnosis present

## 2018-09-03 DIAGNOSIS — E7800 Pure hypercholesterolemia, unspecified: Secondary | ICD-10-CM | POA: Diagnosis present

## 2018-09-03 DIAGNOSIS — R000 Tachycardia, unspecified: Secondary | ICD-10-CM | POA: Diagnosis not present

## 2018-09-03 DIAGNOSIS — I361 Nonrheumatic tricuspid (valve) insufficiency: Secondary | ICD-10-CM | POA: Diagnosis not present

## 2018-09-03 DIAGNOSIS — Z7901 Long term (current) use of anticoagulants: Secondary | ICD-10-CM | POA: Diagnosis not present

## 2018-09-03 DIAGNOSIS — R531 Weakness: Secondary | ICD-10-CM | POA: Diagnosis not present

## 2018-09-03 DIAGNOSIS — Z79899 Other long term (current) drug therapy: Secondary | ICD-10-CM | POA: Diagnosis not present

## 2018-09-03 LAB — COMPREHENSIVE METABOLIC PANEL
ALT: 34 U/L (ref 0–44)
AST: 34 U/L (ref 15–41)
Albumin: 4.6 g/dL (ref 3.5–5.0)
Alkaline Phosphatase: 34 U/L — ABNORMAL LOW (ref 38–126)
Anion gap: 12 (ref 5–15)
BUN: 12 mg/dL (ref 8–23)
CO2: 25 mmol/L (ref 22–32)
Calcium: 10.2 mg/dL (ref 8.9–10.3)
Chloride: 102 mmol/L (ref 98–111)
Creatinine, Ser: 1.12 mg/dL (ref 0.61–1.24)
GFR calc Af Amer: >60 mL/min (ref >60)
GFR calc non Af Amer: 60 mL/min — ABNORMAL LOW (ref >60)
Glucose, Bld: 112 mg/dL — ABNORMAL HIGH (ref 70–99)
Potassium: 3.7 mmol/L (ref 3.5–5.1)
Sodium: 139 mmol/L (ref 135–145)
Total Bilirubin: 1.1 mg/dL (ref 0.3–1.2)
Total Protein: 7.3 g/dL (ref 6.5–8.1)

## 2018-09-03 LAB — DIFFERENTIAL
Abs Immature Granulocytes: 0.03 10*3/uL (ref 0.00–0.07)
Basophils Absolute: 0 10*3/uL (ref 0.0–0.1)
Basophils Relative: 1 %
Eosinophils Absolute: 0.1 10*3/uL (ref 0.0–0.5)
Eosinophils Relative: 2 %
Immature Granulocytes: 1 %
Lymphocytes Relative: 27 %
Lymphs Abs: 1.5 10*3/uL (ref 0.7–4.0)
Monocytes Absolute: 0.9 10*3/uL (ref 0.1–1.0)
Monocytes Relative: 17 %
Neutro Abs: 2.9 10*3/uL (ref 1.7–7.7)
Neutrophils Relative %: 52 %

## 2018-09-03 LAB — APTT: aPTT: 41 seconds — ABNORMAL HIGH (ref 24–36)

## 2018-09-03 LAB — CBC
HCT: 51.6 % (ref 39.0–52.0)
Hemoglobin: 16.7 g/dL (ref 13.0–17.0)
MCH: 28.2 pg (ref 26.0–34.0)
MCHC: 32.4 g/dL (ref 30.0–36.0)
MCV: 87 fL (ref 80.0–100.0)
Platelets: 172 10*3/uL (ref 150–400)
RBC: 5.93 MIL/uL — ABNORMAL HIGH (ref 4.22–5.81)
RDW: 13.6 % (ref 11.5–15.5)
WBC: 5.4 10*3/uL (ref 4.0–10.5)
nRBC: 0 % (ref 0.0–0.2)

## 2018-09-03 LAB — I-STAT CREATININE, ED: Creatinine, Ser: 1.1 mg/dL (ref 0.61–1.24)

## 2018-09-03 LAB — PROTIME-INR
INR: 1.2 (ref 0.8–1.2)
Prothrombin Time: 15.4 seconds — ABNORMAL HIGH (ref 11.4–15.2)

## 2018-09-03 LAB — ETHANOL: Alcohol, Ethyl (B): <10 mg/dL (ref <10)

## 2018-09-03 MED ORDER — ACETAMINOPHEN 325 MG PO TABS: 650.0000 mg | ORAL_TABLET | ORAL | Status: DC | PRN

## 2018-09-03 MED ORDER — ACETAMINOPHEN 650 MG RE SUPP: 650.0000 mg | RECTAL | Status: DC | PRN

## 2018-09-03 MED ORDER — APIXABAN 5 MG PO TABS
5.0000 mg | ORAL_TABLET | Freq: Two times a day (BID) | ORAL | Status: DC
  Administered 2018-09-04 – 2018-09-05 (×3): 5 mg via ORAL
  Filled 2018-09-03 (×4): qty 1

## 2018-09-03 MED ORDER — ASPIRIN 81 MG PO CHEW
81.0000 mg | CHEWABLE_TABLET | Freq: Every day | ORAL | Status: DC
  Administered 2018-09-03 – 2018-09-05 (×3): 81 mg via ORAL
  Filled 2018-09-03 (×4): qty 1

## 2018-09-03 MED ORDER — CHLORHEXIDINE GLUCONATE 0.12 % MT SOLN
15.0000 mL | Freq: Two times a day (BID) | OROMUCOSAL | Status: DC
  Administered 2018-09-04 – 2018-09-05 (×3): 15 mL via OROMUCOSAL
  Filled 2018-09-03 (×3): qty 15

## 2018-09-03 MED ORDER — ACETAMINOPHEN 160 MG/5ML PO SOLN: 650.0000 mg | ORAL | Status: DC | PRN

## 2018-09-03 MED ORDER — SODIUM CHLORIDE 0.9 % IV SOLN: INTRAVENOUS | Status: DC

## 2018-09-03 MED ORDER — LORATADINE 10 MG PO TABS
10.0000 mg | ORAL_TABLET | Freq: Every day | ORAL | Status: DC
  Administered 2018-09-04 – 2018-09-05 (×2): 10 mg via ORAL
  Filled 2018-09-03 (×2): qty 1

## 2018-09-03 MED ORDER — ROSUVASTATIN CALCIUM 5 MG PO TABS
10.0000 mg | ORAL_TABLET | Freq: Every day | ORAL | Status: DC
  Administered 2018-09-04: 17:00:00 10 mg via ORAL
  Filled 2018-09-03: qty 2

## 2018-09-03 MED ORDER — PANTOPRAZOLE SODIUM 40 MG PO TBEC
40.0000 mg | DELAYED_RELEASE_TABLET | Freq: Every day | ORAL | Status: DC
  Administered 2018-09-04 – 2018-09-05 (×2): 40 mg via ORAL
  Filled 2018-09-03 (×2): qty 1

## 2018-09-03 MED ORDER — ASPIRIN 325 MG PO TABS
325.0000 mg | ORAL_TABLET | Freq: Every day | ORAL | Status: DC
  Administered 2018-09-03: 19:00:00 325 mg via ORAL
  Filled 2018-09-03: qty 1

## 2018-09-03 MED ORDER — IOHEXOL 350 MG/ML SOLN
100.0000 mL | Freq: Once | INTRAVENOUS | Status: AC | PRN
  Administered 2018-09-03: 21:00:00 100 mL via INTRAVENOUS

## 2018-09-03 MED ORDER — EZETIMIBE 10 MG PO TABS
10.0000 mg | ORAL_TABLET | Freq: Every day | ORAL | Status: DC
  Administered 2018-09-03 – 2018-09-05 (×3): 10 mg via ORAL
  Filled 2018-09-03 (×3): qty 1

## 2018-09-03 MED ORDER — HYDRALAZINE HCL 25 MG PO TABS
25.0000 mg | ORAL_TABLET | Freq: Two times a day (BID) | ORAL | Status: DC
  Administered 2018-09-03 – 2018-09-05 (×4): 25 mg via ORAL
  Filled 2018-09-03 (×4): qty 1

## 2018-09-03 MED ORDER — SENNOSIDES-DOCUSATE SODIUM 8.6-50 MG PO TABS: 1.0000 | ORAL_TABLET | Freq: Every evening | ORAL | Status: DC | PRN

## 2018-09-03 MED ORDER — STROKE: EARLY STAGES OF RECOVERY BOOK
Freq: Once | Status: DC
  Filled 2018-09-03: qty 1

## 2018-09-03 MED ORDER — ENOXAPARIN SODIUM 40 MG/0.4ML ~~LOC~~ SOLN
40.0000 mg | SUBCUTANEOUS | Status: DC
  Administered 2018-09-03: 19:00:00 40 mg via SUBCUTANEOUS
  Filled 2018-09-03: qty 0.4

## 2018-09-03 MED ORDER — SODIUM CHLORIDE 0.9% FLUSH: 10.0000 mL | INTRAVENOUS | Status: DC | PRN

## 2018-09-03 MED ORDER — APIXABAN 5 MG PO TABS: 5.0000 mg | ORAL_TABLET | Freq: Two times a day (BID) | ORAL | Status: DC

## 2018-09-03 MED ORDER — TRIAMTERENE-HCTZ 37.5-25 MG PO TABS
1.0000 | ORAL_TABLET | Freq: Every day | ORAL | Status: DC
  Administered 2018-09-04 – 2018-09-05 (×2): 1 via ORAL
  Filled 2018-09-03 (×2): qty 1

## 2018-09-03 MED ORDER — DILTIAZEM HCL ER COATED BEADS 240 MG PO CP24
240.0000 mg | ORAL_CAPSULE | Freq: Every day | ORAL | Status: DC
  Administered 2018-09-03 – 2018-09-05 (×3): 240 mg via ORAL
  Filled 2018-09-03 (×3): qty 1

## 2018-09-03 MED ORDER — ASPIRIN 300 MG RE SUPP: 300.0000 mg | Freq: Every day | RECTAL | Status: DC

## 2018-09-03 NOTE — ED Notes (Signed)
Nurse Navigator received phone number for daughter Seymour Bars [phone] who is now in [place], will update

## 2018-09-03 NOTE — ED Provider Notes (Signed)
I received the patient in signout from Dr. Melina Copa.  Briefly the patient is a 85 year old male with a chief complaints of acute onset weakness yesterday afternoon while gardening.  Worse on the left side than the right.  Case was discussed with the neurologist and plan was for MRI to evaluate for acute stroke.  The MRI has returned and the patient has had a acute stroke.  I discussed the case with Dr. Malen Gauze who recommended admission to the hospital.  Will discuss with the hospitalist.   Deno Etienne, DO 09/03/18 1555

## 2018-09-03 NOTE — ED Notes (Signed)
ED TO INPATIENT HANDOFF REPORT  ED Nurse Name and Phone #:  Hassan Rowan [phone]  S Name/Age/Gender Steven Meadows 85 y.o. male Room/Bed: 029C/029C  Code Status   Code Status: Prior  Home/SNF/Other Home Patient oriented to: self, place, time and situation Is this baseline? Yes   Triage Complete: Triage complete  Chief Complaint Weakness  Triage Note PT here for generalized weakness since yesterday at 4 pm and tingling in bilateral arms for a couple of days. Pt ambulatory, grip strength equal. Feels weaker on L side.    Allergies Allergies  Allergen Reactions  . Nabumetone Other (See Comments)    Gi upset  . Flonase [Fluticasone Propionate] Other (See Comments)    Nose bleed    Level of Care/Admitting Diagnosis ED Disposition    ED Disposition Condition Comment   Admit  Hospital Area: [place] [[id number]]  Level of Care: Telemetry Medical [104]  I expect the patient will be discharged within 24 hours: Yes  LOW acuity---Tx typically complete <24 hrs---ACUTE conditions typically can be evaluated <24 hours---LABS likely to return to acceptable levels <24 hours---IS near functional baseline---EXPECTED to return to current living arrangement---NOT newly hypoxic: Does not meet criteria for 5C-Observation unit  Covid Evaluation: N/A  Diagnosis: Acute CVA (cerebrovascular accident) [facility]) [[id number]]  Admitting Physician: Marcell Anger [[id number]]  Attending Physician: Marcell Anger [[id number]]  Bed request comments: neuro unit  PT Class (Do Not Modify): Observation [104]  PT Acc Code (Do Not Modify): Observation [10022]       B Medical/Surgery History Past Medical History:  Diagnosis Date  . Abdominal aortic atherosclerosis ([place])   . Actinic keratoses   . 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 Atrial fibrillation ([place])   . Cough   . History of mononucleosis   . HTN (hypertension)   . Hypercholesteremia   . Lacunar stroke ([place])   . Low  back pain   . Olecranon bursitis   . Rotator cuff tear    Past Surgical History:  Procedure Laterality Date  . CATARACT EXTRACTION--right eye  2013  . TONSILLECTOMY AND ADENOIDECTOMY       A IV Location/Drains/Wounds Patient Lines/Drains/Airways Status   Active Line/Drains/Airways    Name:   Placement date:   Placement time:   Site:   Days:   Peripheral IV 09/03/18 Left Hand   09/03/18    1701    Hand   less than 1          Intake/Output Last 24 hours No intake or output data in the 24 hours ending 09/03/18 1702  Labs/Imaging Results for orders placed or performed during the hospital encounter of 09/03/18 (from the past 48 hour(s))  Ethanol     Status: None   Collection Time: 09/03/18 10:06 AM  Result Value Ref Range   Alcohol, Ethyl (B) <10 <10 mg/dL    Comment: (NOTE) Lowest detectable limit for serum alcohol is 10 mg/dL. For medical purposes only. Performed at [facility], [place] [address] [place], [place] [address]   Protime-INR     Status: Abnormal   Collection Time: 09/03/18 10:06 AM  Result Value Ref Range   Prothrombin Time 15.4 (H) 11.4 - 15.2 seconds   INR 1.2 0.8 - 1.2    Comment: (NOTE) INR goal varies based on device and disease states. Performed at [facility], [place] [address] [place], [place] [address]   APTT     Status: Abnormal  Collection Time: 09/03/18 10:06 AM  Result Value Ref Range   aPTT 41 (H) 24 - 36 seconds    Comment:        IF BASELINE aPTT IS ELEVATED, SUGGEST PATIENT RISK ASSESSMENT BE USED TO DETERMINE APPROPRIATE ANTICOAGULANT THERAPY. Performed at [facility], [place] [address] [place], [place] [address]   CBC     Status: Abnormal   Collection Time: 09/03/18 10:06 AM  Result Value Ref Range   WBC 5.4 4.0 - 10.5 K/uL   RBC 5.93 (H) 4.22 - 5.81 MIL/uL   Hemoglobin 16.7 13.0 - 17.0 g/dL   HCT 51.6 39.0 - 52.0 %   MCV 87.0 80.0 - 100.0 fL   MCH 28.2 26.0 - 34.0 pg   MCHC 32.4 30.0 - 36.0 g/dL   RDW 13.6  11.5 - 15.5 %   Platelets 172 150 - 400 K/uL   nRBC 0.0 0.0 - 0.2 %    Comment: Performed at [facility], [place] [address] [place], [place] [address]  Differential     Status: None   Collection Time: 09/03/18 10:06 AM  Result Value Ref Range   Neutrophils Relative % 52 %   Neutro Abs 2.9 1.7 - 7.7 K/uL   Lymphocytes Relative 27 %   Lymphs Abs 1.5 0.7 - 4.0 K/uL   Monocytes Relative 17 %   Monocytes Absolute 0.9 0.1 - 1.0 K/uL   Eosinophils Relative 2 %   Eosinophils Absolute 0.1 0.0 - 0.5 K/uL   Basophils Relative 1 %   Basophils Absolute 0.0 0.0 - 0.1 K/uL   Immature Granulocytes 1 %   Abs Immature Granulocytes 0.03 0.00 - 0.07 K/uL    Comment: Performed at [facility] [address], [place], [place] [address]  Comprehensive metabolic panel     Status: Abnormal   Collection Time: 09/03/18 10:06 AM  Result Value Ref Range   Sodium 139 135 - 145 mmol/L   Potassium 3.7 3.5 - 5.1 mmol/L   Chloride 102 98 - 111 mmol/L   CO2 25 22 - 32 mmol/L   Glucose, Bld 112 (H) 70 - 99 mg/dL   BUN 12 8 - 23 mg/dL   Creatinine, Ser 1.12 0.61 - 1.24 mg/dL   Calcium 10.2 8.9 - 10.3 mg/dL   Total Protein 7.3 6.5 - 8.1 g/dL   Albumin 4.6 3.5 - 5.0 g/dL   AST 34 15 - 41 U/L   ALT 34 0 - 44 U/L   Alkaline Phosphatase 34 (L) 38 - 126 U/L   Total Bilirubin 1.1 0.3 - 1.2 mg/dL   GFR calc non Af Amer 60 (L) >60 mL/min   GFR calc Af Amer >60 >60 mL/min   Anion gap 12 5 - 15    Comment: Performed at [place] [address], [place], [place] [address]  I-stat Creatinine, ED     Status: None   Collection Time: 09/03/18 10:10 AM  Result Value Ref Range   Creatinine, Ser 1.10 0.61 - 1.24 mg/dL   Ct Head Wo Contrast  Result Date: 09/03/2018 CLINICAL DATA:  Generalized weakness. Bilateral arm tingling for the past few days. EXAM: CT HEAD WITHOUT CONTRAST TECHNIQUE: Contiguous axial images were obtained from the base of the skull through the vertex without intravenous contrast.  COMPARISON:  CT head dated January 23, 2016. FINDINGS: Brain: No evidence of acute infarction, hemorrhage, hydrocephalus, extra-axial collection or mass lesion/mass effect. Stable mild atrophy and moderate  chronic microvascular ischemic changes. Unchanged small lacunar infarcts in the left basal ganglia and right frontal periventricular white matter. Vascular: Calcified atherosclerosis at the skullbase. No hyperdense vessel. Skull: Normal. Negative for fracture or focal lesion. Sinuses/Orbits: No acute finding. Other: None. IMPRESSION: 1.  No acute intracranial abnormality. 2. Stable atrophy and chronic microvascular ischemic changes. Electronically Signed   By: Titus Dubin M.D.   On: 09/03/2018 09:33   Mr Brain Wo Contrast  Result Date: 09/03/2018 CLINICAL DATA:  Generalize weakness beginning 24 hours ago. Tingling in both arms. EXAM: MRI HEAD WITHOUT CONTRAST TECHNIQUE: Multiplanar, multiecho pulse sequences of the brain and surrounding structures were obtained without intravenous contrast. COMPARISON:  Head CT same day.  MRI 12/20/2014. FINDINGS: Brain: Diffusion imaging shows a 1 cm region of acute infarction affecting the radiating deep white matter tracts on the right at the level of the posterior body of the right lateral ventricle. No evidence of swelling or hemorrhage. No other acute infarction there chronic small-vessel ischemic changes of the pons, with some hemosiderin deposition. No focal cerebellar insult. Cerebral hemispheres show old lacunar infarctions of the thalami, basal ganglia and hemispheric deep and subcortical white matter, with scattered hemosiderin deposition. No large vessel territory infarction. No mass lesion, acute hemorrhage, hydrocephalus or extra-axial collection. Vascular: Major vessels at the base of the brain show flow. There is aneurysmal dilatation associated with the distal right vertebral artery, measuring 6 mm. This appears similar to the study of 2016. Skull and  upper cervical spine: Negative Sinuses/Orbits: Clear/normal Other: None IMPRESSION: 1 cm acute infarction in the radiating white matter tracts on the right adjacent to the posterior body of the right lateral ventricle. No sign of hemorrhage or swelling in this location. Extensive chronic small-vessel ischemic changes elsewhere throughout the brain as outlined above. Punctate hemosiderin deposition associated with many of the old infarctions. 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 Electronically Signed   By: Nelson Chimes M.D.   On: 09/03/2018 15:43    Pending Labs Unresulted Labs (From admission, onward)    Start     Ordered   09/03/18 0903  Urine rapid drug screen (hosp performed)  ONCE - STAT,   STAT     09/03/18 0902   09/03/18 0903  Urinalysis, Routine w reflex microscopic  ONCE - STAT,   STAT     09/03/18 0902   Signed and Held  Hemoglobin A1c  Tomorrow morning,   R     Signed and Held   Signed and Held  Lipid panel  Tomorrow morning,   R    Comments:  Fasting    Signed and Held   Signed and Held  CBC  (enoxaparin (LOVENOX)    CrCl >/= 30 ml/min)  Once,   R    Comments:  Baseline for enoxaparin therapy IF NOT ALREADY DRAWN.  Notify MD if PLT < 100 K.    Signed and Held   Signed and Held  Creatinine, serum  (enoxaparin (LOVENOX)    CrCl >/= 30 ml/min)  Once,   R    Comments:  Baseline for enoxaparin therapy IF NOT ALREADY DRAWN.    Signed and Held   Signed and Held  Creatinine, serum  (enoxaparin (LOVENOX)    CrCl >/= 30 ml/min)  Weekly,   R    Comments:  while on enoxaparin therapy    Signed and Held  Vitals/Pain Today's Vitals   09/03/18 1445 09/03/18 1456 09/03/18 1616 09/03/18 1617  BP: (!) 174/113  (!) 181/118 (!) 181/118  Pulse: (!) 101  100 75  Resp: 19  13   Temp:      TempSrc:      SpO2: 97%  98% 99%  Weight:      Height:      PainSc:  0-No pain  0-No pain    Isolation Precautions No  active isolations  Medications Medications  aspirin chewable tablet 81 mg (81 mg Oral Given 09/03/18 1648)    Mobility walks Low fall risk   Focused Assessments Neuro Assessment Handoff:  Swallow screen pass? Yes    NIH Stroke Scale ( + Modified Stroke Scale Criteria)  Interval: Shift assessment Level of Consciousness (1a.)   : Alert, keenly responsive LOC Questions (1b. )   +: Answers both questions correctly LOC Commands (1c. )   + : Performs both tasks correctly Best Gaze (2. )  +: Normal Visual (3. )  +: No visual loss Facial Palsy (4. )    : Normal symmetrical movements Motor Arm, Left (5a. )   +: No drift Motor Arm, Right (5b. )   +: No drift Motor Leg, Left (6a. )   +: No drift Motor Leg, Right (6b. )   +: No drift Limb Ataxia (7. ): Absent Sensory (8. )   +: Normal, no sensory loss Best Language (9. )   +: No aphasia Dysarthria (10. ): Normal Extinction/Inattention (11.)   +: No Abnormality Modified SS Total  +: 0 Complete NIHSS TOTAL: 0 Last date known well: 09/02/18 Last time known well: 1600 Neuro Assessment:   Neuro Checks:   Shift assessment (09/03/18 1639)  Last Documented NIHSS Modified Score: 0 (09/03/18 1639) Has TPA been given? No If patient is a Neuro Trauma and patient is going to OR before floor call report to [place] nurse: [phone] or [phone]     R Recommendations: See Admitting Provider Note  Report given to:   Additional Notes:

## 2018-09-03 NOTE — ED Notes (Signed)
Hospitalist here to see pt. Updated daughter Izora Gala.  Pt denies any distress and states that he is feeling well.

## 2018-09-03 NOTE — ED Notes (Addendum)
Nurse Navigator called daughter to let her know that pt is being admitted and gave her the room number upstairs. She will bring Cpap, I advised her that she can call me when she gets here and I will come get it from her and take it to him.

## 2018-09-03 NOTE — H&P (Addendum)
History and Physical    Steven Meadows PJK:[id number] DOB: 1931/08/18 DOA: 09/03/2018  PCP: Josetta Huddle, MD  Patient coming from: home  I have personally briefly reviewed patient's old medical records in [place]  Chief Complaint: Weakness in bilateral lower extremities with fall  HPI: Steven Meadows is a 85 y.o. male with medical history significant for A. fib on chronic anticoagulation, history of TIAs, prior strokes, hypertension, hyperlipidemia rhythm who presented to the ER with a greater than 12-hour history of weakness.  Patient reports he was working in his garden yesterday afternoon and felt very weak around 3-4 o'clock and came in the house at 5 PM.  He reports a equal weakness in bilateral lower extremities and difficulty making a back to the house.  In the house with rest he felt a little bit better and went to bed.  He woke up approximately 3:00 in the morning to go to the bathroom and fell reporting generalized weakness of bilateral lower extremities although left leg felt weaker than right.  He had difficulty getting off the floor and unfortunately his wife also had a fall during the night and broke her wrist.  After about 10 to 15 minutes he was able to get off the floor and went back to bed and woke up in the morning and presented to the ER.  As noted he reported severe weakness bilateral lower extremities which lasted most of the day and through the night.  It was not associated with any particular events but because of its persistent nature he presented to the ED.  ER course: Patient's lab work was unremarkable CT scan of head showed no acute intracranial abnormality and stable atrophy with chronic microvascular ischemic changes.  Done which showed 1 cm acute infarction in the radiating white matter tracts on the right adjacent to the posterior body of the right lateral ventricle.  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  Patient was seen by neurology in the ER and was admitted by the hospital service for further eval and treatment with neurology consultation.    Review of Systems: As per HPI otherwise 10 point review of systems done notable for bilateral lower extremity weakness with left greater than right all other reviewed and are negative Past Medical History:  Diagnosis Date   Abdominal aortic atherosclerosis (HCC)    Actinic keratoses    Aneurysm of vertebral artery (HCC)    Atrial fibrillation (HCC)    Cough    History of mononucleosis    HTN (hypertension)    Hypercholesteremia    Lacunar stroke (HCC)    Low back pain    Olecranon bursitis    Rotator cuff tear     Past Surgical History:  Procedure Laterality Date   CATARACT EXTRACTION--right eye  2013   TONSILLECTOMY AND ADENOIDECTOMY       reports that he has never smoked. He has never used smokeless tobacco. He reports current alcohol use. He reports that he does not use drugs.  Allergies  Allergen Reactions   Nabumetone Other (See Comments)    Gi upset   Flonase [Fluticasone Propionate] Other (See Comments)    Nose bleed    Family History  Problem Relation Age of Onset   Heart attack Paternal Grandfather    Heart attack Maternal Grandfather    Cancer Paternal Grandmother    Healthy Mother    Transient ischemic attack Mother  Healthy Father    Healthy Sister      Prior to Admission medications   Medication Sig Start Date End Date Taking? Authorizing Provider  apixaban (ELIQUIS) 5 MG TABS tablet Take 1 tablet (5 mg total) by mouth 2 (two) times daily. 12/23/14  Yes Hosie Poisson, MD  chlorhexidine (PERIDEX) 0.12 % solution 15 mLs by Mouth Rinse route 2 (two) times daily. Uses nightly 08/11/18  Yes [provider]  diltiazem (CARDIZEM CD) 240 MG 24 hr capsule Take 1 capsule (240 mg total) by mouth daily. 12/23/14  Yes Hosie Poisson, MD  ezetimibe (ZETIA) 10 MG tablet Take 10 mg by  mouth daily.   Yes [provider]  hydrALAZINE (APRESOLINE) 25 MG tablet Take 25 mg by mouth 2 (two) times daily. 06/28/18  Yes [provider]  loratadine (CLARITIN) 10 MG tablet Take 10 mg by mouth daily.   Yes [provider]  omeprazole (PRILOSEC) 40 MG capsule Take 40 mg by mouth daily. 07/20/15  Yes [provider]  rosuvastatin (CRESTOR) 10 MG tablet Take 1 tablet (10 mg total) by mouth daily at 6 PM. 12/23/14  Yes Hosie Poisson, MD  triamterene-hydrochlorothiazide (MAXZIDE-25) 37.5-25 MG tablet Take 1 tablet by mouth daily. 07/12/18  Yes [provider]    Physical Exam: Vitals:   09/03/18 0849 09/03/18 1010 09/03/18 1445 09/03/18 1617  BP:  (!) 157/99 (!) 174/113 (!) 181/118  Pulse:  95 (!) 101 75  Resp:  17 19   Temp:  98.7 F (37.1 C)    TempSrc:  Oral    SpO2:  97% 97% 99%  Weight: 97.5 kg     Height: 5\' 11"  (1.803 m)       Constitutional: NAD, calm, comfortable Vitals:   09/03/18 0849 09/03/18 1010 09/03/18 1445 09/03/18 1617  BP:  (!) 157/99 (!) 174/113 (!) 181/118  Pulse:  95 (!) 101 75  Resp:  17 19   Temp:  98.7 F (37.1 C)    TempSrc:  Oral    SpO2:  97% 97% 99%  Weight: 97.5 kg     Height: 5\' 11"  (1.803 m)      Eyes: PERRL, lids and conjunctivae normal ENMT: Mucous membranes are moist. Posterior pharynx clear of any exudate or lesions.Normal dentition.  Neck: normal, supple, no masses, no thyromegaly Respiratory: clear to auscultation bilaterally, no wheezing, no crackles. Normal respiratory effort. No accessory muscle use.  Cardiovascular: Regular rate and rhythm, no murmurs / rubs / gallops. No extremity edema. 2+ pedal pulses. No carotid bruits.  Abdomen: no tenderness, no masses palpated. No hepatosplenomegaly. Bowel sounds positive.  Musculoskeletal: no clubbing / cyanosis. No joint deformity upper and lower extremities. Good ROM, no contractures. Normal muscle tone.  Skin: no rashes, lesions, ulcers. No  induration Neurologic: CN 2-12 grossly intact. Sensation intact, DTR normal.  Strength appeared to be equal in bilateral lower extremities although 4+ out of 5 on left upper extremity, 5 out of 5 in right upper extremity Psychiatric: Normal judgment and insight. Alert and oriented x 3. Normal mood.     Labs on Admission: I have personally reviewed following labs and imaging studies  CBC: Recent Labs  Lab 09/03/18 1006  WBC 5.4  NEUTROABS 2.9  HGB 16.7  HCT 51.6  MCV 87.0  PLT 235   Basic Metabolic Panel: Recent Labs  Lab 09/03/18 1006 09/03/18 1010  NA 139  --   K 3.7  --   CL 102  --  CO2 25  --   GLUCOSE 112*  --   BUN 12  --   CREATININE 1.12 1.10  CALCIUM 10.2  --    GFR: Estimated Creatinine Clearance: 59.5 mL/min (by C-G formula based on SCr of 1.1 mg/dL). Liver Function Tests: Recent Labs  Lab 09/03/18 1006  AST 34  ALT 34  ALKPHOS 34*  BILITOT 1.1  PROT 7.3  ALBUMIN 4.6   No results for input(s): LIPASE, AMYLASE in the last 168 hours. No results for input(s): AMMONIA in the last 168 hours. Coagulation Profile: Recent Labs  Lab 09/03/18 1006  INR 1.2   Cardiac Enzymes: No results for input(s): CKTOTAL, CKMB, CKMBINDEX, TROPONINI in the last 168 hours. BNP (last 3 results) No results for input(s): PROBNP in the last 8760 hours. HbA1C: No results for input(s): HGBA1C in the last 72 hours. CBG: No results for input(s): GLUCAP in the last 168 hours. Lipid Profile: No results for input(s): CHOL, HDL, LDLCALC, TRIG, CHOLHDL, LDLDIRECT in the last 72 hours. Thyroid Function Tests: No results for input(s): TSH, T4TOTAL, FREET4, T3FREE, THYROIDAB in the last 72 hours. Anemia Panel: No results for input(s): VITAMINB12, FOLATE, FERRITIN, TIBC, IRON, RETICCTPCT in the last 72 hours. Urine analysis:    Component Value Date/Time   COLORURINE YELLOW 01/05/2009 [place] 01/05/2009 0535   LABSPEC 1.012 01/05/2009 0535   PHURINE 6.5  01/05/2009 0535   GLUCOSEU NEGATIVE 01/05/2009 0535   HGBUR NEGATIVE 01/05/2009 0535   BILIRUBINUR NEGATIVE 01/05/2009 0535   KETONESUR NEGATIVE 01/05/2009 0535   PROTEINUR NEGATIVE 01/05/2009 0535   UROBILINOGEN 0.2 01/05/2009 0535   NITRITE NEGATIVE 01/05/2009 0535   LEUKOCYTESUR  01/05/2009 0535    NEGATIVE MICROSCOPIC NOT DONE ON URINES WITH NEGATIVE PROTEIN, BLOOD, LEUKOCYTES, NITRITE, OR GLUCOSE <1000 mg/dL.    Radiological Exams on Admission: Ct Head Wo Contrast  Result Date: 09/03/2018 CLINICAL DATA:  Generalized weakness. Bilateral arm tingling for the past few days. EXAM: CT HEAD WITHOUT CONTRAST TECHNIQUE: Contiguous axial images were obtained from the base of the skull through the vertex without intravenous contrast. COMPARISON:  CT head dated January 23, 2016. FINDINGS: Brain: No evidence of acute infarction, hemorrhage, hydrocephalus, extra-axial collection or mass lesion/mass effect. Stable mild atrophy and moderate chronic microvascular ischemic changes. Unchanged small lacunar infarcts in the left basal ganglia and right frontal periventricular white matter. Vascular: Calcified atherosclerosis at the skullbase. No hyperdense vessel. Skull: Normal. Negative for fracture or focal lesion. Sinuses/Orbits: No acute finding. Other: None. IMPRESSION: 1.  No acute intracranial abnormality. 2. Stable atrophy and chronic microvascular ischemic changes. Electronically Signed   By: Titus Dubin M.D.   On: 09/03/2018 09:33   Mr Brain Wo Contrast  Result Date: 09/03/2018 CLINICAL DATA:  Generalize weakness beginning 24 hours ago. Tingling in both arms. EXAM: MRI HEAD WITHOUT CONTRAST TECHNIQUE: Multiplanar, multiecho pulse sequences of the brain and surrounding structures were obtained without intravenous contrast. COMPARISON:  Head CT same day.  MRI 12/20/2014. FINDINGS: Brain: Diffusion imaging shows a 1 cm region of acute infarction affecting the radiating deep white matter tracts on the  right at the level of the posterior body of the right lateral ventricle. No evidence of swelling or hemorrhage. No other acute infarction there chronic small-vessel ischemic changes of the pons, with some hemosiderin deposition. No focal cerebellar insult. Cerebral hemispheres show old lacunar infarctions of the thalami, basal ganglia and hemispheric deep and subcortical white matter, with scattered hemosiderin deposition. No large vessel territory infarction.  No mass lesion, acute hemorrhage, hydrocephalus or extra-axial collection. Vascular: Major vessels at the base of the brain show flow. There is aneurysmal dilatation associated with the distal right vertebral artery, measuring 6 mm. This appears similar to the study of 2016. Skull and upper cervical spine: Negative Sinuses/Orbits: Clear/normal Other: None IMPRESSION: 1 cm acute infarction in the radiating white matter tracts on the right adjacent to the posterior body of the right lateral ventricle. No sign of hemorrhage or swelling in this location. Extensive chronic small-vessel ischemic changes elsewhere throughout the brain as outlined above. Punctate hemosiderin deposition associated with many of the old infarctions. 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 Electronically Signed   By: Nelson Chimes M.D.   On: 09/03/2018 15:43    EKG: Independently reviewed.  And shows atrial fibrillation rate controlled  Assessment/Plan Active Problems:   Acute CVA (cerebrovascular accident) ([place])     Acute CVA.  Patient was admitted under stroke protocol.  Is placed on aspirin, Lipitor, MRI of brain was already obtained, per neurology consultation will order CTA of head neck, neurology is seen in the ER will follow.  PT, OT, speech, A1c, lipid panel.  Per the ER physician neurology was undecided whether to continue patient's home anticoagulation likely secondary to concerns for hemorrhagic  conversion.  I will defer that decision to them.  Hypertension we will allow permissive hypertension per protocol.  I will continue his rate control medications but hold his other blood pressure medications which include hydralazine, and Maxide.  Hyperlipidemia: patient is on Zetia and Crestor which we will continue, lipid panel has been ordered titrate medications based on results of lipid panel.  Atrial fibrillation on anticoagulation with apixaban.  As noted above continue patient's home medications for rate control.  Holding anticoagulation secondary to neurology's conversation with the ER regarding concern for risk of hemorrhagic conversion.  As above, will defer adding home anticoagulation back in to neurology.  Medical decision making. Patient is a complex neurological event with mild left upper extremity weakness.  He will require aggressive neurological evaluation and intervention with antithrombotic's and antiplatelets as well as being evaluated for neurological deficits by PT OT and speech therapy.   DVT prophylaxis: SCD/Compression stockings  Code Status: Full Code Status History    Date Active Date Inactive Code Status Order ID Comments User Context   12/21/2014 0048 12/23/2014 1934 Full Code [id number]  Theressa Millard, MD Inpatient     Family Communication: Wife with ER attending at bedside Disposition Plan:   As above, patient will be admitted under observation although he does have a complex neurological event with mild left upper extremity weakness.  He will require aggressive neurological evaluation and intervention with antithrombotic's and antiplatelets as well as being evaluated for neurological deficits by PT OT and speech therapy Consults called: Neurology by the emergency room Admission status: Observation  45 minutes were spent on this admission  Nicolette Bang MD [facility]   If 7PM-7AM, please contact night-coverage   09/03/2018, 4:49 PM

## 2018-09-03 NOTE — ED Notes (Signed)
Patient transported to CT 

## 2018-09-03 NOTE — ED Notes (Signed)
Nurse navigator spoke with patient via facetime with patient's wife who is also in the ED. Wife spoke with husband prior to being transferred to surgery.

## 2018-09-03 NOTE — ED Provider Notes (Signed)
[facility] EMERGENCY DEPARTMENT Provider Note   CSN: [id number] Arrival date & time: 09/03/18  0847    History   Chief Complaint Chief Complaint  Patient presents with   Weakness    HPI Steven Meadows is a 85 y.o. male.  He is on Eliquis.  He has a history of A. fib and stroke along with a vertebral aneurysm.  He is complaining of acute onset of generalized weakness starting around 4 PM while gardening.  He said it was very difficulty upon walk back into the house.  He feels that maybe is a little more left side than equal.  He had to get up to go to the bathroom a few times overnight and he said he had to sit down because he was so weak.  His last neurologic event was about 3 years ago and caused him difficulty speaking which has resolved.  He denies any recent illness or medication changes.  No cough or shortness of breath abdominal pain dysuria fevers or chills.     The history is provided by the patient.  Weakness  Severity:  Moderate Onset quality:  Sudden Duration:  16 hours Timing:  Constant Chronicity:  New Relieved by:  Nothing Worsened by:  Activity Ineffective treatments:  None tried Associated symptoms: difficulty walking   Associated symptoms: no abdominal pain, no aphasia, no chest pain, no cough, no diarrhea, no dysphagia, no dysuria, no fever, no foul-smelling urine, no headaches, no loss of consciousness, no nausea, no shortness of breath, no vision change and no vomiting   Risk factors: neurologic disease     Past Medical History:  Diagnosis Date   Abdominal aortic atherosclerosis (HCC)    Actinic keratoses    Aneurysm of vertebral artery (HCC)    Atrial fibrillation (HCC)    Cough    History of mononucleosis    HTN (hypertension)    Hypercholesteremia    Lacunar stroke (HCC)    Low back pain    Olecranon bursitis    Rotator cuff tear     Patient Active Problem List   Diagnosis Date Noted   Cerebrovascular accident  (CVA) due to embolism of right posterior cerebral artery ([place]) 07/25/2015   Chronic anticoagulation 01/23/2015   HLD (hyperlipidemia) 01/23/2015   Essential hypertension 01/23/2015   Brain aneurysm 01/23/2015   CVA (cerebral infarction) 12/21/2014   Atrial fibrillation with RVR ([place]) 12/21/2014   Aneurysm of vertebral artery ([place]) 12/21/2014   Stroke with cerebral ischemia ([place])    TIA (transient ischemic attack) 12/20/2014   Atrial fibrillation ([place]) 12/20/2014   Hypertension 06/17/2011   Hyperlipidemia 06/17/2011   Stroke ([place]) 06/17/2011   Cough 06/17/2011    Past Surgical History:  Procedure Laterality Date   CATARACT EXTRACTION--right eye  2013   TONSILLECTOMY AND ADENOIDECTOMY          Home Medications    Prior to Admission medications   Medication Sig Start Date End Date Taking? Authorizing Provider  apixaban (ELIQUIS) 5 MG TABS tablet Take 1 tablet (5 mg total) by mouth 2 (two) times daily. 12/23/14   Hosie Poisson, MD  chlorthalidone (HYGROTON) 25 MG tablet Take 25 mg by mouth every Monday, Wednesday, and Friday.     [provider]  diltiazem (CARDIZEM CD) 240 MG 24 hr capsule Take 1 capsule (240 mg total) by mouth daily. 12/23/14   Hosie Poisson, MD  ezetimibe (ZETIA) 10 MG tablet Take 10 mg by mouth daily.    [provider]  loratadine (CLARITIN) 10 MG tablet Take 10 mg by mouth daily.    [provider]  omeprazole (PRILOSEC) 40 MG capsule Take 40 mg by mouth daily. 07/20/15   [provider]  rosuvastatin (CRESTOR) 10 MG tablet Take 1 tablet (10 mg total) by mouth daily at 6 PM. 12/23/14   Hosie Poisson, MD  valsartan (DIOVAN) 320 MG tablet Take 320 mg by mouth at bedtime.     [provider]    Family History Family History  Problem Relation Age of Onset   Heart attack Paternal Grandfather    Heart attack Maternal Grandfather    Cancer Paternal Grandmother    Healthy Mother    Transient  ischemic attack Mother    Healthy Father    Healthy Sister     Social History Social History   Tobacco Use   Smoking status: Never Smoker   Smokeless tobacco: Never Used  Substance Use Topics   Alcohol use: Yes    Alcohol/week: 0.0 standard drinks    Comment: 2 drinks per night   Drug use: No     Allergies   Nabumetone   Review of Systems Review of Systems  Constitutional: Negative for fever.  HENT: Negative for sore throat.   Eyes: Negative for visual disturbance.  Respiratory: Negative for cough and shortness of breath.   Cardiovascular: Negative for chest pain.  Gastrointestinal: Negative for abdominal pain, diarrhea, dysphagia, nausea and vomiting.  Genitourinary: Negative for dysuria.  Musculoskeletal: Positive for gait problem. Negative for neck pain.  Skin: Negative for rash.  Neurological: Positive for weakness and numbness (feet baseline). Negative for loss of consciousness and headaches.     Physical Exam Updated Vital Signs BP (!) 167/104    M[place]ows 85    Temp 98.7 F (37.1 C) (Oral)    Resp 18    Ht 5\' 11"  (1.803 m)    Wt 97.5 kg    SpO2 98%    BMI 29.99 kg/m   Physical Exam Vitals signs and nursing note reviewed.  Constitutional:      Appearance: He is well-developed.  HENT:     Head: Normocephalic and atraumatic.  Eyes:     Conjunctiva/sclera: Conjunctivae normal.  Neck:     Musculoskeletal: Neck supple.  Cardiovascular:     Rate and Rhythm: Normal rate. Rhythm irregular.     Heart sounds: No murmur.  Pulmonary:     Effort: Pulmonary effort is normal. No respiratory distress.     Breath sounds: Normal breath sounds.  Abdominal:     Palpations: Abdomen is soft.     Tenderness: There is no abdominal tenderness.  Musculoskeletal: Normal range of motion.        General: No tenderness or deformity.  Skin:    General: Skin is warm and dry.     Capillary Refill: Capillary refill takes less than 2 seconds.  Neurological:     Mental  Status: He is alert and oriented to person, place, and time.     Comments: Patient is awake and alert.  His upper extremity strength is preserved to her may be slightly less on the left.  He has some left pronator drift.  Lower extremity strength is intact.  Proprioception is off on his lower extremities but he says he has neuropathy.  He is able to do rapid alternating finger movements although is slower on the left.  His finger-to-nose is normal in the right but off on the left.  Cranial nerves grossly intact.      ED Treatments / Results  Labs (all labs ordered are listed, but only abnormal results are displayed) Labs Reviewed  PROTIME-INR - Abnormal; Notable for the following components:      Result Value   Prothrombin Time 15.4 (*)    All other components within normal limits  APTT - Abnormal; Notable for the following components:   aPTT 41 (*)    All other components within normal limits  CBC - Abnormal; Notable for the following components:   RBC 5.93 (*)    All other components within normal limits  COMPREHENSIVE METABOLIC PANEL - Abnormal; Notable for the following components:   Glucose, Bld 112 (*)    Alkaline Phosphatase 34 (*)    GFR calc non Af Amer 60 (*)    All other components within normal limits  ETHANOL  DIFFERENTIAL  RAPID URINE DRUG SCREEN, HOSP PERFORMED  URINALYSIS, ROUTINE W REFLEX MICROSCOPIC  I-STAT CREATININE, ED    EKG EKG Interpretation  Date/Time:  Friday Sep 03 2018 10:09:41 EDT Ventricular Rate:  99 PR Interval:    QRS Duration: 160 QT Interval:  394 QTC Calculation: 506 R Axis:   26 Text Interpretation:  atrial fib Right bundle branch block new rbbb since prior 8/16 Confirmed by Aletta Edouard [phone]) on 09/03/2018 10:26:35 AM   Radiology Ct Head Wo Contrast  Result Date: 09/03/2018 CLINICAL DATA:  Generalized weakness. Bilateral arm tingling for the past few days. EXAM: CT HEAD WITHOUT CONTRAST TECHNIQUE: Contiguous axial images were  obtained from the base of the skull through the vertex without intravenous contrast. COMPARISON:  CT head dated January 23, 2016. FINDINGS: Brain: No evidence of acute infarction, hemorrhage, hydrocephalus, extra-axial collection or mass lesion/mass effect. Stable mild atrophy and moderate chronic microvascular ischemic changes. Unchanged small lacunar infarcts in the left basal ganglia and right frontal periventricular white matter. Vascular: Calcified atherosclerosis at the skullbase. No hyperdense vessel. Skull: Normal. Negative for fracture or focal lesion. Sinuses/Orbits: No acute finding. Other: None. IMPRESSION: 1.  No acute intracranial abnormality. 2. Stable atrophy and chronic microvascular ischemic changes. Electronically Signed   By: Titus Dubin M.D.   On: 09/03/2018 09:33   Mr Brain Wo Contrast  Result Date: 09/03/2018 CLINICAL DATA:  Generalize weakness beginning 24 hours ago. Tingling in both arms. EXAM: MRI HEAD WITHOUT CONTRAST TECHNIQUE: Multiplanar, multiecho [place] sequences of the brain and surrounding structures were obtained without intravenous contrast. COMPARISON:  Head CT same day.  MRI 12/20/2014. FINDINGS: Brain: Diffusion imaging shows a 1 cm region of acute infarction affecting the radiating deep white matter tracts on the right at the level of the posterior body of the right lateral ventricle. No evidence of swelling or hemorrhage. No other acute infarction there chronic small-vessel ischemic changes of the pons, with some hemosiderin deposition. No focal cerebellar insult. Cerebral hemispheres show old lacunar infarctions of the thalami, basal ganglia and hemispheric deep and subcortical white matter, with scattered hemosiderin deposition. No large vessel territory infarction. No mass lesion, acute hemorrhage, hydrocephalus or extra-axial collection. Vascular: Major vessels at the base of the brain show flow. There is aneurysmal dilatation associated with the distal right  vertebral artery, measuring 6 mm. This appears similar to the study of 2016. Skull and upper cervical spine: Negative Sinuses/Orbits: Clear/normal Other: None IMPRESSION: 1 cm acute infarction in the radiating white matter tracts on the right adjacent to the posterior body of the right lateral ventricle. No sign of hemorrhage or  swelling in this location. Extensive chronic small-vessel ischemic changes elsewhere throughout the brain as outlined above. Punctate hemosiderin deposition associated with many of the old infarctions. 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 Electronically Signed   By: Nelson Chimes M.D.   On: 09/03/2018 15:43    Procedures Procedures (including critical care time)  Medications Ordered in ED Medications - No data to display   Initial Impression / Assessment and Plan / ED Course  I have reviewed the triage vital signs and the nursing notes.  Pertinent labs & imaging results that were available during my care of the patient were reviewed by me and considered in my medical decision making (see chart for details).  Clinical Course as of Sep 02 1740  Fri Sep 03, 2771  [id number] 85 year old male with acute onset of generalized weakness for possibly some left-sided symptoms 2.  Differential diagnosis includes metabolic derangement, anemia, infection, stroke   [MB]  1327 Discussed with Dr. Lorraine Lax from neurology who agrees with current plan for getting an MRI.   [MB]    Clinical Course User Index [MB] Hayden Rasmussen, MD         Final Clinical Impressions(s) / ED Diagnoses   Final diagnoses:  Acute CVA (cerebrovascular accident) [facility])    ED Discharge Orders    None       Hayden Rasmussen, MD 09/03/18 1743

## 2018-09-03 NOTE — ED Triage Notes (Signed)
PT here for generalized weakness since yesterday at 4 pm and tingling in bilateral arms for a couple of days. Pt ambulatory, grip strength equal. Feels weaker on L side.

## 2018-09-03 NOTE — Consult Note (Addendum)
NEURO HOSPITALIST  CONSULT   Requesting Physician: Dr. Melina Copa    Chief Complaint: generalized weakness and bilateral arm tingling  History obtained from:  Patient    HPI:                                                                                                                                         Steven Meadows is an 85 y.o. male  With PMH  CVA ( 2016),  HLD, HTN, a. Fib ( eliquis), vertebral aneurysm ( stable)  who presented to [facility] with a 1 day history of generalized weakness.   Yesterday about 1600 while gardening he had an acute onset of generalized weakness. It was very difficult for him to walk back into the house.  He sat on the chair and felt tired.  She had trouble getting up from the chair and would need to hold onto things.  This morning at 3:00 when he went to use the bathroom patient fell down.  He examined himself and noticed that his left arm was drifting and decided that he may be having a stroke and drove to the ER.  CT head was performed which showed no acute findings.  MRI brain was ordered in the emergency room and showed a 1 cm subcortical infarct in the right hemisphere.  Patient is on Eliquis for atrial fibrillation is compliant with his medication.   ED course:  CTH: no hemorrhage BP: 157/99  BG: 112 MRI: 1 cm acute infarction in the radiating white matter tracts on the right adjacent to the posterior body of the right lateral ventricle.  12/20/2014: 7 mm non-hemorrhagic involving, infarct right splenium; no residual deficits.   Date last known well:09/02/2018 Time last known well: 1600 tPA Given: No: outside of window; on eliquis Modified Rankin: Rankin Score=1     Past Medical History:  Diagnosis Date  . Abdominal aortic atherosclerosis ([place])   . Actinic keratoses   . 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 Atrial fibrillation ([place])   . Cough   . History of mononucleosis   . HTN (hypertension)   .  Hypercholesteremia   . Lacunar stroke ([place])   . Low back pain   . Olecranon bursitis   . Rotator cuff tear     Past Surgical History:  Procedure Laterality Date  . CATARACT EXTRACTION--right eye  2013  . TONSILLECTOMY AND ADENOIDECTOMY      Family History  Problem Relation Age of Onset  . Heart attack Paternal Grandfather   . Heart attack Maternal Grandfather   . Cancer Paternal Grandmother   . Healthy Mother   .  Transient ischemic attack Mother   . Healthy Father   . Healthy Sister         Social History:  reports that he has never smoked. He has never used smokeless tobacco. He reports current alcohol use. He reports that he does not use drugs.  Allergies:  Allergies  Allergen Reactions  . Nabumetone Other (See Comments)    Gi upset  . Flonase [Fluticasone Propionate] Other (See Comments)    Nose bleed    Medications:                                                                                                                           No current facility-administered medications for this encounter.    Current Outpatient Medications  Medication Sig Dispense Refill  . apixaban (ELIQUIS) 5 MG TABS tablet Take 1 tablet (5 mg total) by mouth 2 (two) times daily. 60 tablet 1  . chlorhexidine (PERIDEX) 0.12 % solution 15 mLs by Mouth Rinse route 2 (two) times daily. Uses nightly    . diltiazem (CARDIZEM CD) 240 MG 24 hr capsule Take 1 capsule (240 mg total) by mouth daily. 30 capsule 0  . ezetimibe (ZETIA) 10 MG tablet Take 10 mg by mouth daily.    . hydrALAZINE (APRESOLINE) 25 MG tablet Take 25 mg by mouth 2 (two) times daily.    Marland Kitchen loratadine (CLARITIN) 10 MG tablet Take 10 mg by mouth daily.    Marland Kitchen omeprazole (PRILOSEC) 40 MG capsule Take 40 mg by mouth daily.    . rosuvastatin (CRESTOR) 10 MG tablet Take 1 tablet (10 mg total) by mouth daily at 6 PM. 30 tablet 0  . triamterene-hydrochlorothiazide (MAXZIDE-25) 37.5-25 MG tablet Take 1 tablet by mouth daily.        ROS:                                                                                                                                       ROS was performed and is negative except as noted in HPI   General Examination:  Per Dr. Lorraine Lax  Blood pressure (!) 157/99, pulse 95, temperature 98.7 F (37.1 C), temperature source Oral, resp. rate 17, height 5\' 11"  (1.803 m), weight 97.5 kg, SpO2 97 %.    Neurological Examination  Mental Status: Alert, oriented, thought content appropriate.  Speech fluent without evidence of aphasia.  Able to follow 3 step commands without difficulty. Cranial Nerves: II: visual fields grossly normal, pupils equal, round, reactive to light and accommodation III,IV, VI: ptosis not present, extraocular muscles extra-ocular motions intact bilaterally V,VII: smile symmetric, facial light touch sensation normal bilaterally VIII: hearing normal bilaterally IX,X: gag reflex present XI: trapezius strength/neck flexion strength normal bilaterally XII: tongue strength normal  Motor:  Mild left arm pronator drift, normal grip strength, 5/5 strength in the right upper extremity and bilateral lower extremities Tone and bulk:normal tone throughout; no atrophy noted Sensory:  Reduced over bilateral feet in stocking distribution to temp and light touch Deep Tendon Reflexes: 1+ reflexes bilateral patella, ankle reflexes absent Plantars: Right: downgoing   Left: downgoing Cerebellar: normal finger-to-nose,  and normal heel-to-shin test Gait not assessed       Lab Results: Basic Metabolic Panel: Recent Labs  Lab 09/03/18 1006 09/03/18 1010  NA 139  --   K 3.7  --   CL 102  --   CO2 25  --   GLUCOSE 112*  --   BUN 12  --   CREATININE 1.12 1.10  CALCIUM 10.2  --     CBC: Recent Labs  Lab 09/03/18 1006  WBC 5.4  NEUTROABS 2.9  HGB 16.7  HCT 51.6  MCV 87.0   PLT 172   Imaging: Ct Head Wo Contrast  Result Date: 09/03/2018 CLINICAL DATA:  Generalized weakness. Bilateral arm tingling for the past few days. EXAM: CT HEAD WITHOUT CONTRAST TECHNIQUE: Contiguous axial images were obtained from the base of the skull through the vertex without intravenous contrast. COMPARISON:  CT head dated January 23, 2016. FINDINGS: Brain: No evidence of acute infarction, hemorrhage, hydrocephalus, extra-axial collection or mass lesion/mass effect. Stable mild atrophy and moderate chronic microvascular ischemic changes. Unchanged small lacunar infarcts in the left basal ganglia and right frontal periventricular white matter. Vascular: Calcified atherosclerosis at the skullbase. No hyperdense vessel. Skull: Normal. Negative for fracture or focal lesion. Sinuses/Orbits: No acute finding. Other: None. IMPRESSION: 1.  No acute intracranial abnormality. 2. Stable atrophy and chronic microvascular ischemic changes. Electronically Signed   By: Titus Dubin M.D.   On: 09/03/2018 09:33   Mr Brain Wo Contrast  Result Date: 09/03/2018 CLINICAL DATA:  Generalize weakness beginning 24 hours ago. Tingling in both arms. EXAM: MRI HEAD WITHOUT CONTRAST TECHNIQUE: Multiplanar, multiecho pulse sequences of the brain and surrounding structures were obtained without intravenous contrast. COMPARISON:  Head CT same day.  MRI 12/20/2014. FINDINGS: Brain: Diffusion imaging shows a 1 cm region of acute infarction affecting the radiating deep white matter tracts on the right at the level of the posterior body of the right lateral ventricle. No evidence of swelling or hemorrhage. No other acute infarction there chronic small-vessel ischemic changes of the pons, with some hemosiderin deposition. No focal cerebellar insult. Cerebral hemispheres show old lacunar infarctions of the thalami, basal ganglia and hemispheric deep and subcortical white matter, with scattered hemosiderin deposition. No large vessel  territory infarction. No mass lesion, acute hemorrhage, hydrocephalus or extra-axial collection. Vascular: Major vessels at the base of the brain show flow. There is aneurysmal dilatation associated with the distal right vertebral artery, measuring 6 mm. This appears  similar to the study of 2016. Skull and upper cervical spine: Negative Sinuses/Orbits: Clear/normal Other: None IMPRESSION: 1 cm acute infarction in the radiating white matter tracts on the right adjacent to the posterior body of the right lateral ventricle. No sign of hemorrhage or swelling in this location. Extensive chronic small-vessel ischemic changes elsewhere throughout the brain as outlined above. Punctate hemosiderin deposition associated with many of the old infarctions. 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 Electronically Signed   By: Nelson Chimes M.D.   On: 09/03/2018 15:43       Laurey Morale, MSN, NP-C [facility] [phone]  09/03/2018, 1:55 PM   Attending physician note to follow with Assessment and plan .   Assessment: 85 y.o. male  With PMH  CVA ( 2016),  HLD, HTN, a. Fib ( eliquis), vertebral aneurysm ( stable)  who presented to [facility] with a 1 day history of generalized weakness and gait imablance. CTH: no hemorrhage. TPA not given d/t patient presenting outside of the window and patient on eliquis for a. Fib. MRI shows 1 cm acute infarct. Admit for complete stroke work up.   Location of stroke appears to be small vessel disease.  We will add baby aspirin to his Eliquis regimen, however would not continue this long-term.  We will also perform vascular imaging.  Stroke Risk Factors - atrial fibrillation, hyperlipidemia and hypertension  Recommendations:  --MRI Brain  --CTA head and neck --Echocardiogram -- ASA 81mg  in addition to Eliquis, may continue this for 3 weeks or would not recommend long-term aspirin and anticoagulation. --  High intensity Statin if LDL > 70 -- HgbA1c, fasting lipid panel -- PT consult, OT consult, Speech consult --Telemetry monitoring --Frequent neuro checks --Stroke swallow screen    --please page stroke NP  Or  PA  Or MD from 8am -4 pm  as this patient from this time will be  followed by the stroke.   You can look them up on [email or web address]  Password [email or web address]    NEUROHOSPITALIST ADDENDUM Performed a face to face diagnostic evaluation . ARNP assisted with note only.  I have reviewed the contents of the note and made changes as needed.     Karena Addison Yuritzi Kamp MD [facility] [phone]   If 7pm to 7am, please call on call as listed on AMION.

## 2018-09-03 NOTE — ED Notes (Signed)
Called MRI.  They stated he is next.

## 2018-09-03 NOTE — Progress Notes (Signed)
New Admission Note:  Arrival Method: From ER Mental Orientation: Alert & oriented x4  Telemetry:3w02 Skin: moisture damage on buttocks medially  IV: L hand  Pain:0/10 Safety Measures: Safety Fall Prevention Plan was given, discussed. 0Y11: Patient has been orientated to the room, unit and the staff. Family: Wife & daughter updated   Orders have been reviewed and implemented. Will continue to monitor the patient. Call light has been placed within reach and bed alarm has been activated.   Arta Silence ,RN

## 2018-09-04 ENCOUNTER — Inpatient Hospital Stay (HOSPITAL_COMMUNITY): Payer: Medicare Other

## 2018-09-04 DIAGNOSIS — I361 Nonrheumatic tricuspid (valve) insufficiency: Secondary | ICD-10-CM

## 2018-09-04 DIAGNOSIS — I340 Nonrheumatic mitral (valve) insufficiency: Secondary | ICD-10-CM

## 2018-09-04 LAB — LIPID PANEL
Cholesterol: 120 mg/dL (ref 0–200)
HDL: 46 mg/dL (ref >40)
LDL Cholesterol: 54 mg/dL (ref 0–99)
Total CHOL/HDL Ratio: 2.6 RATIO
Triglycerides: 100 mg/dL (ref <150)
VLDL: 20 mg/dL (ref 0–40)

## 2018-09-04 LAB — ECHOCARDIOGRAM LIMITED
Height: 71 in
Weight: 3440 oz

## 2018-09-04 LAB — HEMOGLOBIN A1C
Hgb A1c MFr Bld: 6 % — ABNORMAL HIGH (ref 4.8–5.6)
Mean Plasma Glucose: 125.5 mg/dL

## 2018-09-04 MED ORDER — PERFLUTREN LIPID MICROSPHERE
INTRAVENOUS | Status: AC
  Administered 2018-09-04: 3 mL via INTRAVENOUS
  Filled 2018-09-04: qty 10

## 2018-09-04 MED ORDER — PERFLUTREN LIPID MICROSPHERE
1.0000 mL | INTRAVENOUS | Status: AC | PRN
  Administered 2018-09-04: 10:00:00 3 mL via INTRAVENOUS
  Filled 2018-09-04: qty 10

## 2018-09-04 NOTE — Progress Notes (Signed)
PROGRESS NOTE    Steven Meadows  TDV:[id number] DOB: [birth year] DOA: 09/03/2018 PCP: Josetta Huddle, MD    Brief Narrative:  85 year old male who presented with bilateral extremity weakness.  He does have significant past medical history for atrial fibrillation, history of CVAs, hypertension, and dyslipidemia.  He reported about 12-hour history of worsening lower extremity weakness, more left than right.  He did experience a mechanical fall as a consequence of his lower extremity weakness.  Initial physical examination his blood pressure was 174/113, pulse rate was 101, respiratory rate 19, temperature 98.7, oxygen saturation 97%.  His lungs are clear to auscultation, heart S1-S2 present and rhythmic, abdomen was soft nontender, no lower extremity edema.  His neuro exam had 4 out of 5 strength left upper extremity.  His head CT had no acute intracranial abnormality.  Stable atrophy and chronic microvascular ischemic changes. EKG 99 bpm, with normal axis, atrial fibrillation with right bundle branch block, no significant T wave changes.   Patient was admitted to the hospital working diagnosis focal neurologic deficit, to rule out acute CVA.   Assessment & Plan:   Principal Problem:   Acute CVA (cerebrovascular accident) [facility]) Active Problems:   Hypertension   Hyperlipidemia   Atrial fibrillation (HCC)   Chronic anticoagulation   1. 1 cm acute infarction in the radiating white matter tract/ right lateral ventricle. Clinically seems to be improving. Pending carotid US and echocardiography. LDL 54. Will continue anticoagulation with apixaban for now. Continue asa and statin. Follow with neurology and physical therapy recommendations.  2. HTN. Continue blood pressure monitoring, continue hydralazine and HCTZ.   3. Chronic atrial fibrillation. Will continue telemetry monitoring, rate is controlled with 240 mg of diltiazem. Continue anticoagulation with apixaban.   4, Dyslipidemia. Continue  statin and ezetimibe therapy. LDL is 54.    DVT prophylaxis: apixabann   Code Status:  full Family Communication:  No family at the bedside  Disposition Plan/ discharge barriers: pending clinical improvement.   Body mass index is 29.99 kg/m. Malnutrition Type:      Malnutrition Characteristics:      Nutrition Interventions:     RN Pressure Injury Documentation:     Consultants:   Neurology   Procedures:     Antimicrobials:       Subjective: Slowly improving strength, but not yet back to baseline, has not been out of bed this am. No nausea or vomiting, no dysphagia, chest pain or dyspnea.   Objective: Vitals:   09/03/18 2258 09/03/18 2335 09/04/18 0335 09/04/18 0739  BP:  (!) 137/99 132/83 (!) 133/93  Pulse: 85 89 78 80  Resp: 18 17 17 18   Temp:  98.2 F (36.8 C) 97.8 F (36.6 C) 97.7 F (36.5 C)  TempSrc:  Oral Oral Oral  SpO2: 98% 96% 98% 98%  Weight:      Height:        Intake/Output Summary (Last 24 hours) at 09/04/2018 0851 Last data filed at 09/04/2018 0335 Gross per 24 hour  Intake -  Output 850 ml  Net -850 ml   Filed Weights   09/03/18 0849  Weight: 97.5 kg    Examination:   General: Not in pain or dyspnea  Neurology: Awake and alert, non focal/ strenth 5/5 proximal and distal, upper extremities, 5/4 proximal and distal.    E ENT: no pallor, no icterus, oral mucosa moist Cardiovascular: No JVD. S1-S2 present, rhythmic, no gallops, rubs, or murmurs. No lower extremity edema. Pulmonary: vesicular breath sounds bilaterally,  adequate air movement, no wheezing, rhonchi or rales. Gastrointestinal. Abdomen with, no organomegaly, non tender, no rebound or guarding Skin. No rashes Musculoskeletal: no joint deformities     Data Reviewed: I have personally reviewed following labs and imaging studies  CBC: Recent Labs  Lab 09/03/18 1006  WBC 5.4  NEUTROABS 2.9  HGB 16.7  HCT 51.6  MCV 87.0  PLT 623   Basic Metabolic Panel:  Recent Labs  Lab 09/03/18 1006 09/03/18 1010  NA 139  --   K 3.7  --   CL 102  --   CO2 25  --   GLUCOSE 112*  --   BUN 12  --   CREATININE 1.12 1.10  CALCIUM 10.2  --    GFR: Estimated Creatinine Clearance: 59.5 mL/min (by C-G formula based on SCr of 1.1 mg/dL). Liver Function Tests: Recent Labs  Lab 09/03/18 1006  AST 34  ALT 34  ALKPHOS 34*  BILITOT 1.1  PROT 7.3  ALBUMIN 4.6   No results for input(s): LIPASE, AMYLASE in the last 168 hours. No results for input(s): AMMONIA in the last 168 hours. Coagulation Profile: Recent Labs  Lab 09/03/18 1006  INR 1.2   Cardiac Enzymes: No results for input(s): CKTOTAL, CKMB, CKMBINDEX, TROPONINI in the last 168 hours. BNP (last 3 results) No results for input(s): PROBNP in the last 8760 hours. HbA1C: Recent Labs    09/04/18 0600  HGBA1C 6.0*   CBG: No results for input(s): GLUCAP in the last 168 hours. Lipid Profile: Recent Labs    09/04/18 0600  CHOL 120  HDL 46  LDLCALC 54  TRIG 100  CHOLHDL 2.6   Thyroid Function Tests: No results for input(s): TSH, T4TOTAL, FREET4, T3FREE, THYROIDAB in the last 72 hours. Anemia Panel: No results for input(s): VITAMINB12, FOLATE, FERRITIN, TIBC, IRON, RETICCTPCT in the last 72 hours.    Radiology Studies: I have reviewed all of the imaging during this hospital visit personally     Scheduled Meds: .  stroke: mapping our early stages of recovery book   Does not apply Once  . apixaban  5 mg Oral BID  . aspirin  81 mg Oral Daily  . chlorhexidine  15 mL Mouth Rinse BID  . diltiazem  240 mg Oral Daily  . ezetimibe  10 mg Oral Daily  . hydrALAZINE  25 mg Oral BID  . loratadine  10 mg Oral Daily  . pantoprazole  40 mg Oral Daily  . rosuvastatin  10 mg Oral q1800  . triamterene-hydrochlorothiazide  1 tablet Oral Daily   Continuous Infusions:   LOS: 1 day        Mirko Tailor Gerome Apley, MD

## 2018-09-04 NOTE — Progress Notes (Signed)
PT Cancellation Note  Patient Details Name: Steven Meadows MRN: [id number] DOB: 13-Mar-1932   Cancelled Treatment:    Reason Eval/Treat Not Completed: Active bedrest order - PT to eval when bedrest order is discontinued.   Julien Girt, PT Acute Rehabilitation Services Pager [phone]  Office [phone]    [place] 09/04/2018, 8:46 AM

## 2018-09-04 NOTE — Progress Notes (Signed)
OT Cancellation Note  Patient Details Name: Steven Meadows MRN: [id number] DOB: [birth year]   Cancelled Treatment:    Reason Eval/Treat Not Completed: Active bedrest order. Will follow and initate OT eval when appropriate and able.   Delight Stare, OT Acute Rehabilitation Services Pager [phone] Office [phone]   Delight Stare 09/04/2018, 7:26 AM

## 2018-09-04 NOTE — Progress Notes (Signed)
Patient placed himself on CPAP for the night and will call if any further assistance needed.

## 2018-09-04 NOTE — Progress Notes (Addendum)
STROKE TEAM PROGRESS NOTE   SUBJECTIVE (INTERVAL HISTORY) No family is at the bedside.  Pt is sitting in bed for lunch. He recounted HPI with me. No aphasia. Still has chronic left hand mild weakness.    OBJECTIVE Vitals:   09/03/18 2258 09/03/18 2335 09/04/18 0335 09/04/18 0739  BP:  (!) 137/99 132/83 (!) 133/93  Pulse: 85 89 78 80  Resp: 18 17 17 18   Temp:  98.2 F (36.8 C) 97.8 F (36.6 C) 97.7 F (36.5 C)  TempSrc:  Oral Oral Oral  SpO2: 98% 96% 98% 98%  Weight:      Height:        CBC:  Recent Labs  Lab 09/03/18 1006  WBC 5.4  NEUTROABS 2.9  HGB 16.7  HCT 51.6  MCV 87.0  PLT 366    Basic Metabolic Panel:  Recent Labs  Lab 09/03/18 1006 09/03/18 1010  NA 139  --   K 3.7  --   CL 102  --   CO2 25  --   GLUCOSE 112*  --   BUN 12  --   CREATININE 1.12 1.10  CALCIUM 10.2  --     Lipid Panel:     Component Value Date/Time   CHOL 120 09/04/2018 0600   TRIG 100 09/04/2018 0600   HDL 46 09/04/2018 0600   CHOLHDL 2.6 09/04/2018 0600   VLDL 20 09/04/2018 0600   LDLCALC 54 09/04/2018 0600   HgbA1c:  Lab Results  Component Value Date   HGBA1C 6.0 (H) 09/04/2018   Urine Drug Screen:     Component Value Date/Time   LABOPIA NONE DETECTED 12/21/2014 1150   COCAINSCRNUR NONE DETECTED 12/21/2014 1150   LABBENZ NONE DETECTED 12/21/2014 1150   AMPHETMU NONE DETECTED 12/21/2014 1150   THCU NONE DETECTED 12/21/2014 1150   LABBARB NONE DETECTED 12/21/2014 1150    Alcohol Level     Component Value Date/Time   ETH <10 09/03/2018 1006    IMAGING  Ct Angio Head W Or Wo Contrast Ct Angio Neck W Or Wo Contrast 09/03/2018 IMPRESSION:  1. No emergent large vessel occlusion or high-grade stenosis.  2. Unchanged appearance of 6 x 8 mm aneurysm at the vertebrobasilar confluence.  3. Carotid bifurcation and aortic atherosclerosis (ICD10-I70.0).    Ct Head Wo Contrast 09/03/2018 IMPRESSION:  1. No acute intracranial abnormality.  2. Stable atrophy and  chronic microvascular ischemic changes.    Mr Brain Wo Contrast 09/03/2018 IMPRESSION:   1 cm acute infarction in the radiating white matter tracts on the right adjacent to the posterior body of the right lateral ventricle.   No sign of hemorrhage or swelling in this location.   Extensive chronic small-vessel ischemic changes elsewhere throughout the brain as outlined above.   Punctate hemosiderin deposition associated with many of the old infarctions.   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   Transthoracic Echocardiogram  Pending  EKG - atrial fibrillation - ventricular response 99 BPM. New RBBB. (See cardiology reading for complete details)    PHYSICAL EXAM  Temp:  [97.6 F (36.4 C)-98.2 F (36.8 C)] 97.6 F (36.4 C) (05/02 1123) Pulse Rate:  [75-101] 78 (05/02 1123) Resp:  [12-19] 17 (05/02 1123) BP: (112-181)/(68-118) 112/68 (05/02 1123) SpO2:  [96 %-99 %] 97 % (05/02 1123)  General - Well nourished, well developed, in no apparent distress.  Ophthalmologic - fundi not visualized due to noncooperation.  Cardiovascular -  irregularly irregular heart rate and rhythm..  Mental Status -  Level of arousal and orientation to time, place, and person were intact. Language including expression, naming, repetition, comprehension was assessed and found intact. Fund of Knowledge was assessed and was intact.  Cranial Nerves II - XII - II - Visual field intact OU. III, IV, VI - Extraocular movements intact. V - Facial sensation intact bilaterally. VII - Facial movement intact bilaterally. VIII - Hearing & vestibular intact bilaterally. X - Palate elevates symmetrically, mild dysarthria. XI - Chin turning & shoulder shrug intact bilaterally. XII - Tongue protrusion intact.  Motor Strength - The patient's strength was normal in all extremities except L hand dexterity mildly difficulty and slight pronator drift  on the left, which is chronic.  Bulk was normal and fasciculations were absent.   Motor Tone - Muscle tone was assessed at the neck and appendages and was normal.  Reflexes - The patient's reflexes were symmetrical in all extremities and he had no pathological reflexes.  Sensory - Light touch, temperature/pinprick were assessed and were symmetrical.    Coordination - The patient had normal movements in the right hand with no ataxia or dysmetria, slight dysmetria on the left FTN, more prominent with eye closed.  Tremor was absent.  Gait and Station - deferred.    ASSESSMENT/PLAN [name] is a 85 y.o. male with history of  CVA ( 2016),  HLD, HTN, a. Fib ( eliquis), vertebral aneurysm ( stable) presenting with a fall and Lt UE weakness. He did not receive IV t-PA due to late presentation and Eliquis therapy.  Stroke: right CR small infarct, likely small vessel disease given location. However, he does have afib so embolic etiology can not be completely ruled out  CT head  - no acute findings  MRI head - 1 cm acute infarction in the radiating white matter tracts on the right adjacent to the posterior body of the right lateral ventricle. Extensive chronic small-vessel ischemic changes. Multiple old infarcts including right pontine lacune  CTA H&N - 6 x 8 mm aneurysm at the vertebrobasilar confluence. Diffuse athero including b/l siphons, bulbs, right V4 and left V1.   2D Echo - pending  LDL - 54  HgbA1c - 6.0  VTE prophylaxis - Eliquis  Diet - regulr  Eliquis (apixaban) daily prior to admission, now on aspirin 81 mg daily and Eliquis (apixaban) daily. Continue ASA and eliquis on discharge  Patient counseled to be compliant with his antithrombotic medications  Ongoing aggressive stroke risk factor management  Therapy recommendations:  pending  Disposition:  Pending  Hx of stroke  12/2014 right splenium infarct on MRI. MRA stable right VA, VBJ fusifum aneurysm. LDL 62  and A1C 6.5. EF 50-55% and CUS neg. Found to have afib RVR, He was discharged with eliquis and cardizem  Chronic afib  On eliquis PTA  Compliant with meds at home  Rate controlled  Continue eliquis  Adding ASA 81mg  for stroke prevention this time.  Known VBJ large aneurysm  Unchanged 6 x 8 mm aneurysm at the vertebrobasilar confluence   Followed up with Dr. Ellene Route   No intervention needed at this time  Hypertension  Stable . On home BP meds . Long-term BP goal normotensive  Hyperlipidemia  Lipid lowering medication PTA:  Crestor 10 mg daily and Zetia 10 mg daily  LDL 54, goal < 70  Current lipid lowering medication: Crestor 10 mg daily and Zetia 10 mg daily  Continue statin and  zetia at discharge  Other Stroke Risk Factors  Advanced age  Family hx of TIA (mother)  Other Active Problems  Pre-syncope - on admission, now resolved. Recommended hydration and slow rising up from sitting or squatting or lying position.    Hospital day # 1  Neurology will sign off. Please call with questions. Pt will follow up with stroke clinic NP at [facility] in about 4 weeks. Thanks for the consult.  Rosalin Hawking, MD PhD Stroke Neurology 09/04/2018 12:39 PM  I spent  35 minutes in total face-to-face time with the patient, more than 50% of which was spent in counseling and coordination of care, reviewing test results, images and medication, and discussing the diagnosis of stroke, hx of stroke, afib, aneurysm, treatment plan and potential prognosis. This patient's care requiresreview of multiple databases, neurological assessment, discussion with family, other specialists and medical decision making of high complexity.   To contact Stroke Continuity provider, please refer to [email or web address] After hours, contact General Neurology

## 2018-09-04 NOTE — Evaluation (Signed)
Speech Language Pathology Evaluation Patient Details Name: Steven Meadows MRN: [id number] DOB: December 08, 1931 Today's Date: 09/04/2018 Time: 1425-1450 SLP Time Calculation (min) (ACUTE ONLY): 25 min  Problem List:  Patient Active Problem List   Diagnosis Date Noted  . Acute CVA (cerebrovascular accident) ([place]) 09/03/2018  . Cerebrovascular accident (CVA) due to embolism of right posterior cerebral artery ([place]) 07/25/2015  . Chronic anticoagulation 01/23/2015  . HLD (hyperlipidemia) 01/23/2015  . Essential hypertension 01/23/2015  . Brain aneurysm 01/23/2015  . CVA (cerebral infarction) 12/21/2014  . Atrial fibrillation with RVR ([place]) 12/21/2014  . Aneurysm of vertebral artery ([place]) 12/21/2014  . Stroke with cerebral ischemia ([place])   . TIA (transient ischemic attack) 12/20/2014  . Atrial fibrillation ([place]) 12/20/2014  . Hypertension 06/17/2011  . Hyperlipidemia 06/17/2011  . Stroke ([place]) 06/17/2011  . Cough 06/17/2011   Past Medical History:  Past Medical History:  Diagnosis Date  . Abdominal aortic atherosclerosis ([place])   . Actinic keratoses   . 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 Atrial fibrillation ([place])   . Cough   . History of mononucleosis   . HTN (hypertension)   . Hypercholesteremia   . Lacunar stroke ([place])   . Low back pain   . Olecranon bursitis   . Rotator cuff tear    Past Surgical History:  Past Surgical History:  Procedure Laterality Date  . CATARACT EXTRACTION--right eye  2013  . TONSILLECTOMY AND ADENOIDECTOMY     HPI:  85 y.o. male with medical history significant for A. fib on chronic anticoagulation, history of TIAs, prior strokes, hypertension, hyperlipidemia rhythm who presented to the ER with a greater than 12-hour history of weakness. Found to have  1 cm acute infarction in the radiating white matter tracts on the right adjacent to the posterior body of the right lateral ventricle.   Assessment / Plan / Recommendation Clinical Impression  Cognitive linguistic funciton appears intact post acute small infaction. 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 Pt gave detailed hx of prior level of funciton and was able to recall all novel information related to this admission and was oriented x4. He also exhibited excellent safety awareness of limitations including his acute left sided weakness of hand and leg. Expressive and receptive language and motor speech skills appeared within funcitonal limits. Pt jokingly noted [place] accent at baseline. Pt will have 24 hour support from spouse at baseline if needed. No further ST needs identifed.    SLP Assessment  SLP Recommendation/Assessment: Patient does not need any further Speech Lanaguage Pathology Services SLP Visit Diagnosis: Other (comment)(assessing cognitive linguistic)    Follow Up Recommendations  None    Frequency and Duration           SLP Evaluation Cognition  Overall Cognitive Status: Within Functional Limits for tasks assessed Arousal/Alertness: Awake/alert Orientation Level: Oriented X4 Attention: Focused;Sustained Focused Attention: Appears intact Sustained Attention: Appears intact Memory: Appears intact Awareness: Appears intact Problem Solving: Appears intact Executive Function: Reasoning;Self Monitoring Reasoning: Appears intact Self Monitoring: Appears intact       Comprehension  Auditory Comprehension Overall Auditory Comprehension: Appears within functional limits for tasks assessed Visual Recognition/Discrimination Discrimination: Within Function Limits    Expression Expression Primary Mode of Expression: Verbal Verbal Expression Overall Verbal Expression: Appears within functional limits for tasks assessed Written Expression Dominant Hand: Right   Oral / Motor  Oral Motor/Sensory Function Overall Oral Motor/Sensory Function: Within functional limits Motor Speech Overall Motor Speech: Appears within  functional limits  for tasks assessed   [place] MA, CCC-SLP Acute Rehab Speech Language Pathologist 09/04/2018, 3:00 PM

## 2018-09-04 NOTE — Progress Notes (Signed)
  Echocardiogram 2D Echocardiogram has been performed.  Steven Meadows 09/04/2018, 10:39 AM

## 2018-09-05 LAB — BASIC METABOLIC PANEL
Anion gap: 10 (ref 5–15)
Anion gap: 10 (ref 5–15)
BUN: 17 mg/dL (ref 8–23)
BUN: 18 mg/dL (ref 8–23)
CO2: 25 mmol/L (ref 22–32)
CO2: 25 mmol/L (ref 22–32)
Calcium: 9.6 mg/dL (ref 8.9–10.3)
Calcium: 9.5 mg/dL (ref 8.9–10.3)
Chloride: 102 mmol/L (ref 98–111)
Chloride: 101 mmol/L (ref 98–111)
Creatinine, Ser: 1.39 mg/dL — ABNORMAL HIGH (ref 0.61–1.24)
Creatinine, Ser: 1.31 mg/dL — ABNORMAL HIGH (ref 0.61–1.24)
GFR calc Af Amer: 54 mL/min — ABNORMAL LOW (ref >60)
GFR calc Af Amer: 58 mL/min — ABNORMAL LOW (ref >60)
GFR calc non Af Amer: 46 mL/min — ABNORMAL LOW (ref >60)
GFR calc non Af Amer: 50 mL/min — ABNORMAL LOW (ref >60)
Glucose, Bld: 120 mg/dL — ABNORMAL HIGH (ref 70–99)
Glucose, Bld: 114 mg/dL — ABNORMAL HIGH (ref 70–99)
Potassium: 3.2 mmol/L — ABNORMAL LOW (ref 3.5–5.1)
Potassium: 3.2 mmol/L — ABNORMAL LOW (ref 3.5–5.1)
Sodium: 137 mmol/L (ref 135–145)
Sodium: 136 mmol/L (ref 135–145)

## 2018-09-05 MED ORDER — SODIUM CHLORIDE 0.9 % IV BOLUS
250.0000 mL | Freq: Once | INTRAVENOUS | Status: AC
  Administered 2018-09-05: 250 mL via INTRAVENOUS

## 2018-09-05 MED ORDER — ASPIRIN 81 MG PO CHEW: 81.0000 mg | CHEWABLE_TABLET | Freq: Every day | ORAL | 0 refills | Status: DC

## 2018-09-05 NOTE — Evaluation (Addendum)
Physical Therapy Evaluation Patient Details Name: Steven Meadows MRN: [id number] DOB: [birth year] Today's Date: 09/05/2018   History of Present Illness  Pt is a 85 y/o male with PMH of a fib, CVA, HTN, low back pain, presents to ED 09/03/18 with increased weakness (L>R) and fall.  CT unremarkable with no acute abnormality. MRI reveals 1 cm acute infarction in the radiating white matter tracts on the right adjacent to the posterior body of the right lateral ventricle.     Clinical Impression  Pt presents with an overall decrease in functional mobility secondary to above. PTA, pt indep and lives with wife (who recently broke her wrist), remains active. Today, pt ambulatory with and without RW and intermittent min guard for balance; initial difficulty standing from low surfaces, able to practice multiple trials to RW with improving technique.  Stability improved with RW use; recommend pt use this upon return home, pt agrees. Pt would benefit from continued acute PT services to maximize functional mobility and independence prior to d/c with HHPT services.     Follow Up Recommendations Home health PT;Supervision for mobility/OOB    Equipment Recommendations  Rolling walker with 5" wheels;3in1 (PT)    Recommendations for Other Services       Precautions / Restrictions Precautions Precautions: Fall Restrictions Weight Bearing Restrictions: No      Mobility  Bed Mobility Overal bed mobility: Modified Independent Bed Mobility: Supine to Sit              Transfers Overall transfer level: Needs assistance Equipment used: Rolling walker (2 wheeled) Transfers: Sit to/from Stand Sit to Stand: Min guard;Supervision         General transfer comment: Pt having difficulty standing from low bed, educ on technique and practiced, able to perform with min guard for balance, heavy reliance on momentum and min guard; stood to RW from bed, BSC (over toilet) and recliner, progressing to supervision  for safety  Ambulation/Gait Ambulation/Gait assistance: Min guard;Supervision Gait Distance (Feet): 300 Feet Assistive device: Rolling walker (2 wheeled);None Gait Pattern/deviations: Step-through pattern;Decreased stride length;Trunk flexed Gait velocity: Decreased Gait velocity interpretation: <1.8 ft/sec, indicate of risk for recurrent falls General Gait Details: Initial forward flexed, shuffling gait with RW, able to correct with cues, supervision ambulating with RW. Additional ambulation without DME and intermittent min guard; pt begins to take shorter steps with fatigue, but corrects with cues  Stairs            Wheelchair Mobility    Modified Rankin (Stroke Patients Only) Modified Rankin (Stroke Patients Only) Pre-Morbid Rankin Score: No symptoms Modified Rankin: Slight disability     Balance Overall balance assessment: Needs assistance Sitting-balance support: Feet supported;No upper extremity supported Sitting balance-Leahy Scale: Good       Standing balance-Leahy Scale: Fair Standing balance comment: Can static stand and walk without UE support               High Level Balance Comments: Unable to step over object on floor without UE support             Pertinent Vitals/Pain Pain Assessment: No/denies pain    Home Living Family/patient expects to be discharged to:: Private residence Living Arrangements: Spouse/significant other Available Help at Discharge: Family;Available 24 hours/day Type of Home: House Home Access: Stairs to enter Entrance Stairs-Rails: Psychiatric nurse of Steps: 2 Home Layout: Two level;Able to live on main level with bedroom/bathroom Home Equipment: Cane - single point;Grab bars - tub/shower  Prior Function Level of Independence: Independent         Comments: retired, likes to garden     Journalist, newspaper   Dominant Hand: Right    Extremity/Trunk Assessment   Upper Extremity  Assessment Upper Extremity Assessment: LUE deficits/detail LUE Deficits / Details: grossly 4-/5    Lower Extremity Assessment Lower Extremity Assessment: RLE deficits/detail;LLE deficits/detail RLE Deficits / Details: Grossly 4/5 LLE Deficits / Details: Grossly 4/5       Communication   Communication: No difficulties  Cognition Arousal/Alertness: Awake/alert Behavior During Therapy: WFL for tasks assessed/performed Overall Cognitive Status: Within Functional Limits for tasks assessed                                        General Comments      Exercises     Assessment/Plan    PT Assessment Patient needs continued PT services  PT Problem List Decreased strength;Decreased activity tolerance;Decreased balance;Decreased mobility;Decreased knowledge of use of DME       PT Treatment Interventions DME instruction;Gait training;Stair training;Functional mobility training;Therapeutic activities;Therapeutic exercise;Balance training;Patient/family education    PT Goals (Current goals can be found in the Care Plan section)  Acute Rehab PT Goals Patient Stated Goal: to get home today PT Goal Formulation: With patient Time For Goal Achievement: 09/19/18 Potential to Achieve Goals: Good    Frequency Min 4X/week   Barriers to discharge        Co-evaluation               AM-PAC PT "6 Clicks" Mobility  Outcome Measure Help needed turning from your back to your side while in a flat bed without using bedrails?: None Help needed moving from lying on your back to sitting on the side of a flat bed without using bedrails?: None Help needed moving to and from a bed to a chair (including a wheelchair)?: A Little Help needed standing up from a chair using your arms (e.g., wheelchair or bedside chair)?: A Little Help needed to walk in hospital room?: A Little Help needed climbing 3-5 steps with a railing? : A Little 6 Click Score: 20    End of Session Equipment  Utilized During Treatment: Gait belt Activity Tolerance: Patient tolerated treatment well Patient left: in chair;with call bell/phone within reach;with chair alarm set Nurse Communication: Mobility status PT Visit Diagnosis: Other abnormalities of gait and mobility (R26.89);Muscle weakness (generalized) (M62.81)    Time: 9476-5465 PT Time Calculation (min) (ACUTE ONLY): 30 min   Charges:   PT Evaluation $PT Eval Moderate Complexity: 1 Mod PT Treatments $Gait Training: 8-22 mins      Mabeline Caras, PT, DPT Acute Rehabilitation Services  Pager [phone] Office [place] 09/05/2018, 5:16 PM

## 2018-09-05 NOTE — Evaluation (Signed)
Occupational Therapy Evaluation Patient Details Name: Steven Meadows MRN: [id number] DOB: 17-Apr-1932 Today's Date: 09/05/2018    History of Present Illness Pt is a 85 y/o male with PMH of a fib, CVA, HTN, low back pain, presents to ED with increased weakness (L>R) and fall.  CT unremarkable with no acute abnormality. MRI reveals 1 cm acute infarction in the radiating white matter tracts on the right adjacent to the posterior body of the right lateral ventricle.    Clinical Impression   PTA patient independent and driving.  Admitted for above and limited by problem list below, including generalized weakness (L>R), impaired balance, decreased activity tolerance. Patient able to complete basic transfers with min guard (after cueing and practice on technique as he initially required mod assist), UB ADLs with setup assist, LB ADls with min assist, and grooming standing at sink with close supervision.  Pt reliant on RW for mobility, able to complete in room mobility with min guard assist for safety.  Pt reports spouse will be able to provide supervision at dc, but no physical assist as she just broke her wrist as well. He will benefit from continued OT services while admitted and after dc at [facility] level in order to optimize independence and safety at dc.     Follow Up Recommendations  Home health OT;Supervision/Assistance - 24 hour    Equipment Recommendations  3 in 1 bedside commode    Recommendations for Other Services       Precautions / Restrictions Precautions Precautions: Fall Restrictions Weight Bearing Restrictions: No      Mobility Bed Mobility Overal bed mobility: Needs Assistance Bed Mobility: Supine to Sit;Sit to Supine     Supine to sit: Supervision Sit to supine: Supervision   General bed mobility comments: supervision for safety   Transfers Overall transfer level: Needs assistance Equipment used: Rolling walker (2 wheeled) Transfers: Sit to/from Stand Sit to Stand:  Mod assist;Min guard         General transfer comment: initally requires mod assist to transition to standing, with cueing/technique able to transition with min guard for safety and balance    Balance Overall balance assessment: Needs assistance Sitting-balance support: Feet supported;No upper extremity supported Sitting balance-Leahy Scale: Good     Standing balance support: Bilateral upper extremity supported;During functional activity Standing balance-Leahy Scale: Fair Standing balance comment: reliant on B UE support, able to wash hands without UE support and close supervision                            ADL either performed or assessed with clinical judgement   ADL Overall ADL's : Needs assistance/impaired     Grooming: Supervision/safety;Standing;Wash/dry hands   Upper Body Bathing: Supervision/ safety;Set up;Sitting   Lower Body Bathing: Supervison/ safety;Set up;Sit to/from stand   Upper Body Dressing : Supervision/safety;Set up;Sitting   Lower Body Dressing: Sit to/from stand;Minimal assistance   Toilet Transfer: Min Psychiatric nurse Details (indicate cue type and reason): simulated to/from EOB  Toileting- Clothing Manipulation and Hygiene: Min guard;Sit to/from stand       Functional mobility during ADLs: Min guard;Rolling walker;Cueing for safety General ADL Comments: pt limited by generalized weakness (L>R), impaired balance and decreased activity tolerance; initally requires mod assist to ascend to standing, but with repitition and cueing for technique fades to min guard     Vision Patient Visual Report: No change from baseline Vision Assessment?: No apparent visual deficits  Perception     Praxis      Pertinent Vitals/Pain Pain Assessment: No/denies pain     Hand Dominance Right   Extremity/Trunk Assessment Upper Extremity Assessment Upper Extremity Assessment: LUE deficits/detail LUE Deficits / Details: grossly 4-/5 LUE  Coordination: decreased fine motor;decreased gross motor   Lower Extremity Assessment Lower Extremity Assessment: Defer to PT evaluation       Communication Communication Communication: No difficulties   Cognition Arousal/Alertness: Awake/alert Behavior During Therapy: WFL for tasks assessed/performed Overall Cognitive Status: Within Functional Limits for tasks assessed                                     General Comments  VSS    Exercises     Shoulder Instructions      Home Living Family/patient expects to be discharged to:: Private residence Living Arrangements: Spouse/significant other Available Help at Discharge: Available 24 hours/day;Family Type of Home: House Home Access: Stairs to enter [employer] of Steps: 2 Entrance Stairs-Rails: Right;Left Home Layout: Two level;Able to live on main level with bedroom/bathroom     Bathroom Shower/Tub: Occupational psychologist: Handicapped height     Home Equipment: [place] - single point;Grab bars - tub/shower      Lives With: Spouse(spouse just broke her wrist and had sx)    Prior Functioning/Environment Level of Independence: Independent        Comments: retired, likes to garden        OT Problem List: Decreased strength;Impaired balance (sitting and/or standing);Decreased activity tolerance;Decreased knowledge of use of DME or AE;Decreased knowledge of precautions      OT Treatment/Interventions: Self-care/ADL training;Therapeutic exercise;Neuromuscular education;DME and/or AE instruction;Therapeutic activities;Patient/family education;Balance training    OT Goals(Current goals can be found in the care plan section) Acute Rehab OT Goals Patient Stated Goal: to get home today OT Goal Formulation: With patient Time For Goal Achievement: 09/19/18 Potential to Achieve Goals: Good  OT Frequency: Min 2X/week   Barriers to D/C:            Co-evaluation               AM-PAC OT "6 Clicks" Daily Activity     Outcome Measure Help from another person eating meals?: None Help from another person taking care of personal grooming?: A Little Help from another person toileting, which includes using toliet, bedpan, or urinal?: A Little Help from another person bathing (including washing, rinsing, drying)?: A Little Help from another person to put on and taking off regular upper body clothing?: None Help from another person to put on and taking off regular lower body clothing?: A Little 6 Click Score: 20   End of Session Equipment Utilized During Treatment: Gait belt;Rolling walker Nurse Communication: Mobility status  Activity Tolerance: Patient tolerated treatment well Patient left: in bed;with bed alarm set;with call bell/phone within reach  OT Visit Diagnosis: Other abnormalities of gait and mobility (R26.89);Muscle weakness (generalized) (M62.81);History of falling (Z91.81)                Time: 5277-8242 OT Time Calculation (min): 27 min Charges:  OT General Charges $OT Visit: 1 Visit OT Evaluation $OT Eval Moderate Complexity: 1 Mod OT Treatments $Self Care/Home Management : 8-22 mins  Delight Stare, OT Acute Rehabilitation Services Pager [phone] Office [phone]   Delight Stare 09/05/2018, 10:06 AM

## 2018-09-05 NOTE — TOC Transition Note (Signed)
Transition of Care [facility]) - CM/SW Discharge Note   Patient Details  Name: Steven Meadows MRN: [id number] Date of Birth: 06-11-1931  Transition of Care [facility]) CM/SW Contact:  Carles Collet, RN Phone Number: 09/05/2018, 2:57 PM   Clinical Narrative:     Damaris Schooner w patient, provided medicare ratings for [facility] and he would like to use [facility], referral accepted by [employer] to deliver DME to room. Patient will call daughter when equipment is delivered for transportation.     Final next level of care: [place] Barriers to Discharge: No Barriers Identified   Patient Goals and CMS Choice Patient states their goals for this hospitalization and ongoing recovery are:: to return home CMS Medicare.gov Compare Post Acute Care list provided to:: Patient Choice offered to / list presented to : Patient  Discharge Placement                       Discharge Plan and Services     Post Acute Care Choice: Durable Medical Equipment, Home Health          DME Arranged: 3-N-1, Walker rolling DME Agency: [employer] Date DME Agency Contacted: 09/05/18 Time DME Agency Contacted: [phone] Representative spoke with at DME Agency: [facility] HH Arranged: PT, OT [place] Agency: [place] ([place]) Date [place]: 09/05/18 Time [place]: [place] Representative spoke with at [place]: [place] ([place]) Interventions     Readmission Risk Interventions Readmission Risk Prevention Plan 09/05/2018  Post Dischage Appt Complete  Medication Screening Complete  Transportation Screening Complete  Some recent data might be hidden

## 2018-09-05 NOTE — Discharge Summary (Addendum)
Physician Discharge Summary  Steven Meadows GBT:[id number] DOB: Sep 29, 1931 DOA: 09/03/2018  PCP: Josetta Huddle, MD  Admit date: 09/03/2018 Discharge date: 09/05/2018  Admitted From: Home  Disposition:  Home   Recommendations for Outpatient Follow-up and new medication changes:  1. Follow up with Dr. Inda Merlin in 7 days.  2. Follow up with outpatient Neurology, at [facility].  3. Aspirin added 81 mg daily.  Home Health: yes   Equipment/Devices: no    Discharge Condition: stable  CODE STATUS: full  Diet recommendation: heart healthy   Brief/Interim Summary: 85 year old male who presented with bilateral extremity weakness.  He does have significant past medical history for atrial fibrillation, history of CVAs, hypertension, and dyslipidemia.  He reported about 12-hour history of worsening lower extremity weakness, more left than right.  He did experience a mechanical fall as a consequence of his lower extremity weakness. On his initial physical examination his blood pressure was 174/113, pulse rate was 101, respiratory rate 19, temperature 98.7, oxygen saturation 97%.  His lungs were clear to auscultation, heart S1-S2 present and rhythmic, abdomen was soft nontender, no lower extremity edema.  Sodium 139, potassium 3.7, chloride 102, bicarb 25, glucose 112, BUN 12, creatinine 1.12, white count 5.4, hemoglobin 16.7, hematocrit 51.6, platelets 172. His neuro exam had 4 out of 5 strength left upper extremity.  His head CT had no acute intracranial abnormality.  Stable atrophy and chronic microvascular ischemic changes. EKG 99 bpm, with normal axis, atrial fibrillation with right bundle branch block, no significant T wave changes.   Patient was admitted to the hospital working diagnosis focal neurologic deficit, to rule out acute CVA.  1.  1 cm acute infarction in the radiating white matter tract/right lateral ventricle.  Patient was admitted to the medical ward, he was placed on a remote telemetry monitor, had  frequent neuro checks, speech evaluation, physical therapy evaluation and neurology consultation.  Further work-up with brain MRI show acute infarction right adjacent to the posterior body of the right lateral ventricle.  CT angiography 6 x 8 mm aneurysm at the vertebrobasilar confluence.  His LDL was 54, hemoglobin A1c 6.0, echocardiogram showed a preserved LV function, no signs of thrombosis.  Aspirin 81 mg was added to his medical regimen, continue lipid-lowering therapy with statin.  Follow-up with outpatient neurology.  Patient will follow-up with outpatient physical therapy.  2.  Hypertension.  Continue blood pressure control with hydralazine and hydrochlorothiazide.  3.  Chronic atrial fibrillation.  Rate remained well controlled with diltiazem, anticoagulation with apixaban.  4.  Dyslipidemia.  Continue statin and ezetimibe.  5. AKI.  Patient had transitory worsening of kidney function, peak creatinine 1.39, at discharge 1.31, follow-up as an outpatient.  Potassium 3.2, serum bicarb 25.  Discharge Diagnoses:  Principal Problem:   Acute CVA (cerebrovascular accident) [facility]) Active Problems:   Hypertension   Hyperlipidemia   Atrial fibrillation [facility])   Chronic anticoagulation    Discharge Instructions  Discharge Instructions    Ambulatory referral to Neurology   Complete by:  As directed    Follow up with stroke clinic NP (Jessica Vanschaick or Cecille Rubin, if both not available, consider Zachery Dauer, or Ahern) at [facility] in about 4 weeks. Thanks.     Allergies as of 09/05/2018      Reactions   Nabumetone Other (See Comments)   Gi upset   Flonase [fluticasone Propionate] Other (See Comments)   Nose bleed      Medication List    TAKE these medications  apixaban 5 MG Tabs tablet Commonly known as:  ELIQUIS Take 1 tablet (5 mg total) by mouth 2 (two) times daily.   aspirin 81 MG chewable tablet Chew 1 tablet (81 mg total) by mouth daily for 30 days.   chlorhexidine  0.12 % solution Commonly known as:  PERIDEX 15 mLs by Mouth Rinse route 2 (two) times daily. Uses nightly   diltiazem 240 MG 24 hr capsule Commonly known as:  CARDIZEM CD Take 1 capsule (240 mg total) by mouth daily.   ezetimibe 10 MG tablet Commonly known as:  ZETIA Take 10 mg by mouth daily.   hydrALAZINE 25 MG tablet Commonly known as:  APRESOLINE Take 25 mg by mouth 2 (two) times daily.   loratadine 10 MG tablet Commonly known as:  CLARITIN Take 10 mg by mouth daily.   omeprazole 40 MG capsule Commonly known as:  PRILOSEC Take 40 mg by mouth daily.   rosuvastatin 10 MG tablet Commonly known as:  CRESTOR Take 1 tablet (10 mg total) by mouth daily at 6 PM.   triamterene-hydrochlorothiazide 37.5-25 MG tablet Commonly known as:  MAXZIDE-25 Take 1 tablet by mouth daily.      Follow-up Information    [facility]. Schedule an appointment as soon as possible for a visit in 4 week(s).   Specialty:  Neurology Contact information: [place] [phone]         Allergies  Allergen Reactions  . Nabumetone Other (See Comments)    Gi upset  . Flonase [Fluticasone Propionate] Other (See Comments)    Nose bleed    Consultations:  Neurology    Procedures/Studies: Ct Angio Head W Or Wo Contrast  Result Date: 09/03/2018 CLINICAL DATA:  Generalized weakness.  History of stroke. EXAM: CT ANGIOGRAPHY HEAD AND NECK TECHNIQUE: Multidetector CT imaging of the head and neck was performed using the standard protocol during bolus administration of intravenous contrast. Multiplanar CT image reconstructions and MIPs were obtained to evaluate the vascular anatomy. Carotid stenosis measurements (when applicable) are obtained utilizing NASCET criteria, using the distal internal carotid diameter as the denominator. CONTRAST:  159mL OMNIPAQUE IOHEXOL 350 MG/ML SOLN COMPARISON:  Head CT 09/03/2018 FINDINGS: CTA NECK  FINDINGS SKELETON: There is no bony spinal canal stenosis. No lytic or blastic lesion. OTHER NECK: Normal pharynx, larynx and major salivary glands. No cervical lymphadenopathy. Unremarkable thyroid gland. UPPER CHEST: No pneumothorax or pleural effusion. No nodules or masses. AORTIC ARCH: There is mild calcific atherosclerosis of the aortic arch. There is no aneurysm, dissection or hemodynamically significant stenosis of the visualized ascending aorta and aortic arch. Conventional 3 vessel aortic branching pattern. The visualized proximal subclavian arteries are widely patent. RIGHT CAROTID SYSTEM: --Common carotid artery: Widely patent origin without common carotid artery dissection or aneurysm. --Internal carotid artery: No dissection, occlusion or aneurysm. Mild atherosclerotic calcification at the carotid bifurcation without hemodynamically significant stenosis. --External carotid artery: No acute abnormality. LEFT CAROTID SYSTEM: --Common carotid artery: Widely patent origin without common carotid artery dissection or aneurysm. --Internal carotid artery: No dissection, occlusion or aneurysm. Mild atherosclerotic calcification at the carotid bifurcation without hemodynamically significant stenosis. --External carotid artery: No acute abnormality. VERTEBRAL ARTERIES: Right dominant configuration. Both origins are normal. No dissection, occlusion or flow-limiting stenosis to the vertebrobasilar confluence. CTA HEAD FINDINGS POSTERIOR CIRCULATION: --Vertebral arteries: Aneurysm at the vertebrobasilar confluence measures 6 x 8 mm, unchanged. Left vertebral artery terminates in PICA. --Posterior inferior cerebellar arteries (PICA): Patent origins from the  vertebral arteries. --Anterior inferior cerebellar arteries (AICA): Patent origins from the basilar artery. --Basilar artery: Distal basilar artery is diminutive. --Superior cerebellar arteries: Normal. --Posterior cerebral arteries (PCA): Normal. Both are  predominantly supplied by the posterior communicating arteries (p-comm). ANTERIOR CIRCULATION: --Intracranial internal carotid arteries: Atherosclerotic calcification of the internal carotid arteries at the skull base without hemodynamically significant stenosis. --Anterior cerebral arteries (ACA): Normal. Both A1 segments are present. Patent anterior communicating artery (a-comm). --Middle cerebral arteries (MCA): Normal. VENOUS SINUSES: As permitted by contrast timing, patent. ANATOMIC VARIANTS: None Review of the MIP images confirms the above findings. IMPRESSION: 1. No emergent large vessel occlusion or high-grade stenosis. 2. Unchanged appearance of 6 x 8 mm aneurysm at the vertebrobasilar confluence. 3. Carotid bifurcation and aortic atherosclerosis (ICD10-I70.0). Electronically Signed   By: Ulyses Jarred M.D.   On: 09/03/2018 21:30   Ct Head Wo Contrast  Result Date: 09/03/2018 CLINICAL DATA:  Generalized weakness. Bilateral arm tingling for the past few days. EXAM: CT HEAD WITHOUT CONTRAST TECHNIQUE: Contiguous axial images were obtained from the base of the skull through the vertex without intravenous contrast. COMPARISON:  CT head dated January 23, 2016. FINDINGS: Brain: No evidence of acute infarction, hemorrhage, hydrocephalus, extra-axial collection or mass lesion/mass effect. Stable mild atrophy and moderate chronic microvascular ischemic changes. Unchanged small lacunar infarcts in the left basal ganglia and right frontal periventricular white matter. Vascular: Calcified atherosclerosis at the skullbase. No hyperdense vessel. Skull: Normal. Negative for fracture or focal lesion. Sinuses/Orbits: No acute finding. Other: None. IMPRESSION: 1.  No acute intracranial abnormality. 2. Stable atrophy and chronic microvascular ischemic changes. Electronically Signed   By: Titus Dubin M.D.   On: 09/03/2018 09:33   Ct Angio Neck W Or Wo Contrast  Result Date: 09/03/2018 CLINICAL DATA:  Generalized  weakness.  History of stroke. EXAM: CT ANGIOGRAPHY HEAD AND NECK TECHNIQUE: Multidetector CT imaging of the head and neck was performed using the standard protocol during bolus administration of intravenous contrast. Multiplanar CT image reconstructions and MIPs were obtained to evaluate the vascular anatomy. Carotid stenosis measurements (when applicable) are obtained utilizing NASCET criteria, using the distal internal carotid diameter as the denominator. CONTRAST:  139mL OMNIPAQUE IOHEXOL 350 MG/ML SOLN COMPARISON:  Head CT 09/03/2018 FINDINGS: CTA NECK FINDINGS SKELETON: There is no bony spinal canal stenosis. No lytic or blastic lesion. OTHER NECK: Normal pharynx, larynx and major salivary glands. No cervical lymphadenopathy. Unremarkable thyroid gland. UPPER CHEST: No pneumothorax or pleural effusion. No nodules or masses. AORTIC ARCH: There is mild calcific atherosclerosis of the aortic arch. There is no aneurysm, dissection or hemodynamically significant stenosis of the visualized ascending aorta and aortic arch. Conventional 3 vessel aortic branching pattern. The visualized proximal subclavian arteries are widely patent. RIGHT CAROTID SYSTEM: --Common carotid artery: Widely patent origin without common carotid artery dissection or aneurysm. --Internal carotid artery: No dissection, occlusion or aneurysm. Mild atherosclerotic calcification at the carotid bifurcation without hemodynamically significant stenosis. --External carotid artery: No acute abnormality. LEFT CAROTID SYSTEM: --Common carotid artery: Widely patent origin without common carotid artery dissection or aneurysm. --Internal carotid artery: No dissection, occlusion or aneurysm. Mild atherosclerotic calcification at the carotid bifurcation without hemodynamically significant stenosis. --External carotid artery: No acute abnormality. VERTEBRAL ARTERIES: Right dominant configuration. Both origins are normal. No dissection, occlusion or  flow-limiting stenosis to the vertebrobasilar confluence. CTA HEAD FINDINGS POSTERIOR CIRCULATION: --Vertebral arteries: Aneurysm at the vertebrobasilar confluence measures 6 x 8 mm, unchanged. Left vertebral artery terminates in PICA. --Posterior inferior cerebellar arteries (  PICA): Patent origins from the vertebral arteries. --Anterior inferior cerebellar arteries (AICA): Patent origins from the basilar artery. --Basilar artery: Distal basilar artery is diminutive. --Superior cerebellar arteries: Normal. --Posterior cerebral arteries (PCA): Normal. Both are predominantly supplied by the posterior communicating arteries (p-comm). ANTERIOR CIRCULATION: --Intracranial internal carotid arteries: Atherosclerotic calcification of the internal carotid arteries at the skull base without hemodynamically significant stenosis. --Anterior cerebral arteries (ACA): Normal. Both A1 segments are present. Patent anterior communicating artery (a-comm). --Middle cerebral arteries (MCA): Normal. VENOUS SINUSES: As permitted by contrast timing, patent. ANATOMIC VARIANTS: None Review of the MIP images confirms the above findings. IMPRESSION: 1. No emergent large vessel occlusion or high-grade stenosis. 2. Unchanged appearance of 6 x 8 mm aneurysm at the vertebrobasilar confluence. 3. Carotid bifurcation and aortic atherosclerosis (ICD10-I70.0). Electronically Signed   By: Ulyses Jarred M.D.   On: 09/03/2018 21:30   Mr Brain Wo Contrast  Result Date: 09/03/2018 CLINICAL DATA:  Generalize weakness beginning 24 hours ago. Tingling in both arms. EXAM: MRI HEAD WITHOUT CONTRAST TECHNIQUE: Multiplanar, multiecho pulse sequences of the brain and surrounding structures were obtained without intravenous contrast. COMPARISON:  Head CT same day.  MRI 12/20/2014. FINDINGS: Brain: Diffusion imaging shows a 1 cm region of acute infarction affecting the radiating deep white matter tracts on the right at the level of the posterior body of the right  lateral ventricle. No evidence of swelling or hemorrhage. No other acute infarction there chronic small-vessel ischemic changes of the pons, with some hemosiderin deposition. No focal cerebellar insult. Cerebral hemispheres show old lacunar infarctions of the thalami, basal ganglia and hemispheric deep and subcortical white matter, with scattered hemosiderin deposition. No large vessel territory infarction. No mass lesion, acute hemorrhage, hydrocephalus or extra-axial collection. Vascular: Major vessels at the base of the brain show flow. There is aneurysmal dilatation associated with the distal right vertebral artery, measuring 6 mm. This appears similar to the study of 2016. Skull and upper cervical spine: Negative Sinuses/Orbits: Clear/normal Other: None IMPRESSION: 1 cm acute infarction in the radiating white matter tracts on the right adjacent to the posterior body of the right lateral ventricle. No sign of hemorrhage or swelling in this location. Extensive chronic small-vessel ischemic changes elsewhere throughout the brain as outlined above. Punctate hemosiderin deposition associated with many of the old infarctions. 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 Electronically Signed   By: Nelson Chimes M.D.   On: 09/03/2018 15:43      Procedures:   Subjective: Patient is feeling better, no nausea or vomiting. Strength lower extremities is recovering.   Discharge Exam: Vitals:   09/05/18 0413 09/05/18 0737  BP: 118/80 (!) 138/93  Pulse: 73 78  Resp: 18 18  Temp: (!) 97.4 F (36.3 C) (!) 97.5 F (36.4 C)  SpO2: 97% 97%   Vitals:   09/04/18 2034 09/04/18 2319 09/05/18 0413 09/05/18 0737  BP: 131/86 123/83 118/80 (!) 138/93  Pulse: 77 70 73 78  Resp: 18 18 18 18   Temp: 98.3 F (36.8 C) 98.2 F (36.8 C) (!) 97.4 F (36.3 C) (!) 97.5 F (36.4 C)  TempSrc: Oral Oral Oral Oral  SpO2: 98% 97% 97% 97%  Weight:      Height:         General: Not in pain or dyspnea.  Neurology: Awake and alert, decreases hand grip left hand 4/5, lower extremity 4/5.   E ENT: no pallor, no icterus, oral mucosa  moist Cardiovascular: No JVD. S1-S2 present, rhythmic, no gallops, rubs, or murmurs. No lower extremity edema. Pulmonary: vesicular breath sounds bilaterally, adequate air movement, no wheezing, rhonchi or rales. Gastrointestinal. Abdomen with no organomegaly, non tender, no rebound or guarding Skin. No rashes Musculoskeletal: no joint deformities   The results of significant diagnostics from this hospitalization (including imaging, microbiology, ancillary and laboratory) are listed below for reference.     Microbiology: No results found for this or any previous visit (from the past 240 hour(s)).   Labs: BNP (last 3 results) No results for input(s): BNP in the last 8760 hours. Basic Metabolic Panel: Recent Labs  Lab 09/03/18 1006 09/03/18 1010 09/05/18 0408  NA 139  --  137  K 3.7  --  3.2*  CL 102  --  102  CO2 25  --  25  GLUCOSE 112*  --  120*  BUN 12  --  17  CREATININE 1.12 1.10 1.39*  CALCIUM 10.2  --  9.6   Liver Function Tests: Recent Labs  Lab 09/03/18 1006  AST 34  ALT 34  ALKPHOS 34*  BILITOT 1.1  PROT 7.3  ALBUMIN 4.6   No results for input(s): LIPASE, AMYLASE in the last 168 hours. No results for input(s): AMMONIA in the last 168 hours. CBC: Recent Labs  Lab 09/03/18 1006  WBC 5.4  NEUTROABS 2.9  HGB 16.7  HCT 51.6  MCV 87.0  PLT 172   Cardiac Enzymes: No results for input(s): CKTOTAL, CKMB, CKMBINDEX, TROPONINI in the last 168 hours. BNP: Invalid input(s): POCBNP CBG: No results for input(s): GLUCAP in the last 168 hours. D-Dimer No results for input(s): DDIMER in the last 72 hours. Hgb A1c Recent Labs    09/04/18 0600  HGBA1C 6.0*   Lipid Profile Recent Labs    09/04/18 0600  CHOL 120  HDL 46  LDLCALC 54  TRIG 100  CHOLHDL 2.6   Thyroid function  studies No results for input(s): TSH, T4TOTAL, T3FREE, THYROIDAB in the last 72 hours.  Invalid input(s): FREET3 Anemia work up No results for input(s): VITAMINB12, FOLATE, FERRITIN, TIBC, IRON, RETICCTPCT in the last 72 hours. Urinalysis    Component Value Date/Time   COLORURINE YELLOW 01/05/2009 0535   APPEARANCEUR CLEAR 01/05/2009 0535   LABSPEC 1.012 01/05/2009 0535   PHURINE 6.5 01/05/2009 0535   GLUCOSEU NEGATIVE 01/05/2009 0535   HGBUR NEGATIVE 01/05/2009 0535   BILIRUBINUR NEGATIVE 01/05/2009 0535   KETONESUR NEGATIVE 01/05/2009 0535   PROTEINUR NEGATIVE 01/05/2009 0535   UROBILINOGEN 0.2 01/05/2009 0535   NITRITE NEGATIVE 01/05/2009 0535   LEUKOCYTESUR  01/05/2009 0535    NEGATIVE MICROSCOPIC NOT DONE ON URINES WITH NEGATIVE PROTEIN, BLOOD, LEUKOCYTES, NITRITE, OR GLUCOSE <1000 mg/dL.   Sepsis Labs Invalid input(s): PROCALCITONIN,  WBC,  LACTICIDVEN Microbiology No results found for this or any previous visit (from the past 240 hour(s)).   Time coordinating discharge: 45 minutes  SIGNED:   Tawni Millers, MD  [facility] 09/05/2018, 8:12 AM

## 2018-09-06 DIAGNOSIS — M6281 Muscle weakness (generalized): Secondary | ICD-10-CM | POA: Diagnosis not present

## 2018-09-06 DIAGNOSIS — I482 Chronic atrial fibrillation, unspecified: Secondary | ICD-10-CM | POA: Diagnosis not present

## 2018-09-06 DIAGNOSIS — Z8673 Personal history of transient ischemic attack (TIA), and cerebral infarction without residual deficits: Secondary | ICD-10-CM | POA: Diagnosis not present

## 2018-09-06 DIAGNOSIS — Z7982 Long term (current) use of aspirin: Secondary | ICD-10-CM | POA: Diagnosis not present

## 2018-09-06 DIAGNOSIS — I10 Essential (primary) hypertension: Secondary | ICD-10-CM | POA: Diagnosis not present

## 2018-09-06 DIAGNOSIS — Z7901 Long term (current) use of anticoagulants: Secondary | ICD-10-CM | POA: Diagnosis not present

## 2018-09-06 DIAGNOSIS — W19XXXD Unspecified fall, subsequent encounter: Secondary | ICD-10-CM | POA: Diagnosis not present

## 2018-09-07 DIAGNOSIS — I482 Chronic atrial fibrillation, unspecified: Secondary | ICD-10-CM | POA: Diagnosis not present

## 2018-09-07 DIAGNOSIS — Z7901 Long term (current) use of anticoagulants: Secondary | ICD-10-CM | POA: Diagnosis not present

## 2018-09-07 DIAGNOSIS — M6281 Muscle weakness (generalized): Secondary | ICD-10-CM | POA: Diagnosis not present

## 2018-09-07 DIAGNOSIS — W19XXXD Unspecified fall, subsequent encounter: Secondary | ICD-10-CM | POA: Diagnosis not present

## 2018-09-07 DIAGNOSIS — I10 Essential (primary) hypertension: Secondary | ICD-10-CM | POA: Diagnosis not present

## 2018-09-07 DIAGNOSIS — Z8673 Personal history of transient ischemic attack (TIA), and cerebral infarction without residual deficits: Secondary | ICD-10-CM | POA: Diagnosis not present

## 2018-09-11 DIAGNOSIS — I10 Essential (primary) hypertension: Secondary | ICD-10-CM | POA: Diagnosis not present

## 2018-09-11 DIAGNOSIS — Z8673 Personal history of transient ischemic attack (TIA), and cerebral infarction without residual deficits: Secondary | ICD-10-CM | POA: Diagnosis not present

## 2018-09-11 DIAGNOSIS — W19XXXD Unspecified fall, subsequent encounter: Secondary | ICD-10-CM | POA: Diagnosis not present

## 2018-09-11 DIAGNOSIS — Z7901 Long term (current) use of anticoagulants: Secondary | ICD-10-CM | POA: Diagnosis not present

## 2018-09-11 DIAGNOSIS — I482 Chronic atrial fibrillation, unspecified: Secondary | ICD-10-CM | POA: Diagnosis not present

## 2018-09-11 DIAGNOSIS — M6281 Muscle weakness (generalized): Secondary | ICD-10-CM | POA: Diagnosis not present

## 2018-09-14 DIAGNOSIS — W19XXXD Unspecified fall, subsequent encounter: Secondary | ICD-10-CM | POA: Diagnosis not present

## 2018-09-14 DIAGNOSIS — Z8673 Personal history of transient ischemic attack (TIA), and cerebral infarction without residual deficits: Secondary | ICD-10-CM | POA: Diagnosis not present

## 2018-09-14 DIAGNOSIS — I639 Cerebral infarction, unspecified: Secondary | ICD-10-CM | POA: Diagnosis not present

## 2018-09-14 DIAGNOSIS — I10 Essential (primary) hypertension: Secondary | ICD-10-CM | POA: Diagnosis not present

## 2018-09-14 DIAGNOSIS — Z7901 Long term (current) use of anticoagulants: Secondary | ICD-10-CM | POA: Diagnosis not present

## 2018-09-14 DIAGNOSIS — I482 Chronic atrial fibrillation, unspecified: Secondary | ICD-10-CM | POA: Diagnosis not present

## 2018-09-14 DIAGNOSIS — M6281 Muscle weakness (generalized): Secondary | ICD-10-CM | POA: Diagnosis not present

## 2018-09-15 DIAGNOSIS — I482 Chronic atrial fibrillation, unspecified: Secondary | ICD-10-CM | POA: Diagnosis not present

## 2018-09-15 DIAGNOSIS — Z8673 Personal history of transient ischemic attack (TIA), and cerebral infarction without residual deficits: Secondary | ICD-10-CM | POA: Diagnosis not present

## 2018-09-15 DIAGNOSIS — I10 Essential (primary) hypertension: Secondary | ICD-10-CM | POA: Diagnosis not present

## 2018-09-15 DIAGNOSIS — Z7901 Long term (current) use of anticoagulants: Secondary | ICD-10-CM | POA: Diagnosis not present

## 2018-09-15 DIAGNOSIS — W19XXXD Unspecified fall, subsequent encounter: Secondary | ICD-10-CM | POA: Diagnosis not present

## 2018-09-15 DIAGNOSIS — M6281 Muscle weakness (generalized): Secondary | ICD-10-CM | POA: Diagnosis not present

## 2018-09-16 DIAGNOSIS — I10 Essential (primary) hypertension: Secondary | ICD-10-CM | POA: Diagnosis not present

## 2018-09-16 DIAGNOSIS — M6281 Muscle weakness (generalized): Secondary | ICD-10-CM | POA: Diagnosis not present

## 2018-09-16 DIAGNOSIS — W19XXXD Unspecified fall, subsequent encounter: Secondary | ICD-10-CM | POA: Diagnosis not present

## 2018-09-16 DIAGNOSIS — I482 Chronic atrial fibrillation, unspecified: Secondary | ICD-10-CM | POA: Diagnosis not present

## 2018-09-16 DIAGNOSIS — Z7901 Long term (current) use of anticoagulants: Secondary | ICD-10-CM | POA: Diagnosis not present

## 2018-09-16 DIAGNOSIS — Z8673 Personal history of transient ischemic attack (TIA), and cerebral infarction without residual deficits: Secondary | ICD-10-CM | POA: Diagnosis not present

## 2018-09-17 DIAGNOSIS — I10 Essential (primary) hypertension: Secondary | ICD-10-CM | POA: Diagnosis not present

## 2018-09-17 DIAGNOSIS — M6281 Muscle weakness (generalized): Secondary | ICD-10-CM | POA: Diagnosis not present

## 2018-09-17 DIAGNOSIS — I482 Chronic atrial fibrillation, unspecified: Secondary | ICD-10-CM | POA: Diagnosis not present

## 2018-09-17 DIAGNOSIS — Z8673 Personal history of transient ischemic attack (TIA), and cerebral infarction without residual deficits: Secondary | ICD-10-CM | POA: Diagnosis not present

## 2018-09-17 DIAGNOSIS — Z7901 Long term (current) use of anticoagulants: Secondary | ICD-10-CM | POA: Diagnosis not present

## 2018-09-17 DIAGNOSIS — W19XXXD Unspecified fall, subsequent encounter: Secondary | ICD-10-CM | POA: Diagnosis not present

## 2018-09-20 DIAGNOSIS — W19XXXD Unspecified fall, subsequent encounter: Secondary | ICD-10-CM | POA: Diagnosis not present

## 2018-09-20 DIAGNOSIS — Z7901 Long term (current) use of anticoagulants: Secondary | ICD-10-CM | POA: Diagnosis not present

## 2018-09-20 DIAGNOSIS — I482 Chronic atrial fibrillation, unspecified: Secondary | ICD-10-CM | POA: Diagnosis not present

## 2018-09-20 DIAGNOSIS — M6281 Muscle weakness (generalized): Secondary | ICD-10-CM | POA: Diagnosis not present

## 2018-09-20 DIAGNOSIS — Z8673 Personal history of transient ischemic attack (TIA), and cerebral infarction without residual deficits: Secondary | ICD-10-CM | POA: Diagnosis not present

## 2018-09-20 DIAGNOSIS — I10 Essential (primary) hypertension: Secondary | ICD-10-CM | POA: Diagnosis not present

## 2018-09-21 DIAGNOSIS — Z8673 Personal history of transient ischemic attack (TIA), and cerebral infarction without residual deficits: Secondary | ICD-10-CM | POA: Diagnosis not present

## 2018-09-21 DIAGNOSIS — M6281 Muscle weakness (generalized): Secondary | ICD-10-CM | POA: Diagnosis not present

## 2018-09-21 DIAGNOSIS — I10 Essential (primary) hypertension: Secondary | ICD-10-CM | POA: Diagnosis not present

## 2018-09-21 DIAGNOSIS — Z7901 Long term (current) use of anticoagulants: Secondary | ICD-10-CM | POA: Diagnosis not present

## 2018-09-21 DIAGNOSIS — I482 Chronic atrial fibrillation, unspecified: Secondary | ICD-10-CM | POA: Diagnosis not present

## 2018-09-21 DIAGNOSIS — W19XXXD Unspecified fall, subsequent encounter: Secondary | ICD-10-CM | POA: Diagnosis not present

## 2018-09-22 DIAGNOSIS — I10 Essential (primary) hypertension: Secondary | ICD-10-CM | POA: Diagnosis not present

## 2018-09-22 DIAGNOSIS — I482 Chronic atrial fibrillation, unspecified: Secondary | ICD-10-CM | POA: Diagnosis not present

## 2018-09-22 DIAGNOSIS — M6281 Muscle weakness (generalized): Secondary | ICD-10-CM | POA: Diagnosis not present

## 2018-09-22 DIAGNOSIS — Z7901 Long term (current) use of anticoagulants: Secondary | ICD-10-CM | POA: Diagnosis not present

## 2018-09-22 DIAGNOSIS — W19XXXD Unspecified fall, subsequent encounter: Secondary | ICD-10-CM | POA: Diagnosis not present

## 2018-09-22 DIAGNOSIS — Z8673 Personal history of transient ischemic attack (TIA), and cerebral infarction without residual deficits: Secondary | ICD-10-CM | POA: Diagnosis not present

## 2018-09-23 ENCOUNTER — Other Ambulatory Visit: Payer: Self-pay

## 2018-09-23 ENCOUNTER — Encounter: Payer: Self-pay | Admitting: Neurology

## 2018-09-23 ENCOUNTER — Ambulatory Visit (INDEPENDENT_AMBULATORY_CARE_PROVIDER_SITE_OTHER): Payer: Medicare Other | Admitting: Neurology

## 2018-09-23 DIAGNOSIS — I639 Cerebral infarction, unspecified: Secondary | ICD-10-CM | POA: Diagnosis not present

## 2018-09-23 DIAGNOSIS — I726 Aneurysm of vertebral artery: Secondary | ICD-10-CM | POA: Diagnosis not present

## 2018-09-23 DIAGNOSIS — I6300 Cerebral infarction due to thrombosis of unspecified precerebral artery: Secondary | ICD-10-CM | POA: Diagnosis not present

## 2018-09-23 NOTE — Progress Notes (Signed)
Virtual Visit via Video Note  I connected with Steven Meadows on 09/23/18 at 11:00 AM EDT by a video enabled telemedicine application and verified that I am speaking with the correct person using two identifiers.  This visit was performed using [email or web address] app for video and since its audio was not working I use the patient's landline for audio.  The patient's daughter was present for this entire visit and facilitated the visit  Location: Patient: at home with daughter Provider: at [facility] office Ref MD : Dr Erlinda Hong Reason for referral : stroke   I discussed the limitations of evaluation and management by telemedicine and the availability of in person appointments. The patient expressed understanding and agreed to proceed.  History of Present Illness: Steven Meadows is a 85 year old pleasant Caucasian male seen today for initial virtual video consultation for stroke.  History is obtained from the patient and review of electronic medical records.  I have personally reviewed imaging films in PACS.  He developed sudden onset of acute weakness in his legs left greater than right on 09/02/2018.  He had trouble getting up from a chair and needed to hold onto things.  He went to sleep but the next day when he woke up he fell down and noticed left arm weakness as well in his left leg weakness.  CT scan of the head in the emergency room showed no acute findings.  MRI scan was obtained which showed 1 cm right periventricular white matter infarct.  Patient had history of atrial fibrillation and was on Eliquis and had been compliant with his medication.  CT angiogram showed no emergent large vessel stenosis or occlusion.  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  Transthoracic echo showed normal ejection fraction.  Cardiac monitoring showed atrial fibrillation.  LDL cholesterol was 54 mg percent.  Hemoglobin A1c was 6.0.  Patient was discharged home  and aspirin was added.  Patient states is done well since discharge.  His left-sided strength has improved.  He does use a walker to walk outdoors but can walk short distances by himself.  He is feels his balance is not completely recovered particularly when he gets up suddenly or makes quick movement.  He is currently getting home physical and occupational therapy.  His blood pressure is well controlled and today it is 135/75.  He has no new complaints.  He does have a past neurological history of right splenium white matter lacunar infarct in August 2016 and had seen Dr. Erlinda Hong at that time.  He had made good recovery from that  Past medical history atrial fibrillation aneurysm of vertebral artery, hypertension, hyperlipidemia, lacunar stroke, low back pain, rotator cuff tear. Past surgical history cataract extraction right eye, tonsillectomy, adenoidectomy, Family history heart attack in grand parents. Social history has never smoked uses alcohol socially. Medication allergy Flonase and nabumetone Medication list Maxide 25, Crestor 10 mg daily, Prilosec 40 mg daily, Claritin 10 mg daily, hydralazine 25 mg twice daily, Zetia 10 mg daily, Cardizem CD 240 mg daily, Peridex 0.12% solution 15 mL twice daily, aspirin 81 mg daily, Eliquis 5 mg twice daily. 14 system review of systems is positive only for as documented above in history of present illness and all other systems negative Observations/Objective: Physical and neurological exam is limited due to constraints from video visit.  Pleasant elderly Caucasian male currently not in distress.  He is awake alert oriented to  time place and person.  He follows commands well.  Speech and language appear normal.  Extraocular movements are full range without nystagmus.  He has mild left nasolabial fold asymmetry when he smiles.  Tongue midline.  Hearing appears slightly diminished bilaterally.  Motor system exam shows no upper or lower extremity drift but fine finger  movements are diminished on the left.  He orbits right over left upper extremity.  Foot tapping is slightly diminished on the left compared to the right.  He is slightly unsteady while standing on left foot unsupported.  He walks with a slow but cautious gait and is slightly unsteady when he turns.  Assessment   85 year old Caucasian male with right subcortical lacunar infarct in May 2020 likely from small vessel disease even though he has atrial fibrillation and was compliant with his anticoagulation with Eliquis.  Past history of right subcortical lacunar infarct in August 2016.  Multiple vascular risk factors of atrial fibrillation, hypertension, hyperlipidemia, prior stroke.  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  I recommend we discontinue aspirin and stay on Eliquis alone for secondary stroke prevention due to increased risk of bleeding and lack of proven benefit of adding aspirin.  We discussed briefly alternatives to Eliquis but there is no definite data suggesting switching Eliquis to Pradaxa or alternative medication may be superior.  Maintain aggressive risk factor modification with strict control of hypertension with blood pressure goal below 130/90, lipids with LDL cholesterol goal below 70 mg percent and diabetes with hemoglobin A1c goal below 6.5%.  I encouraged him to eat a healthy diet with lots of fruits, vegetables, cereals and whole grains as well as be active and exercise 30 minutes a day.  He was advised to use his cane or walker and we also discussed fall safety precautions.  He has asymptomatic 6 x 8 mm vertebrobasilar junction aneurysm which can be followed conservatively for now.  He will continue physical and occupational therapy that he is doing. Follow Up Instructions: Stop aspirin.   Return for follow-up in 3 months to see Janett Billow my nurse practitioner.   I discussed the assessment and treatment plan with the patient. The patient was provided an opportunity to ask questions and all were answered. The patient agreed with the plan and demonstrated an understanding of the instructions.   The patient was advised to call back or seek an in-person evaluation if the symptoms worsen or if the condition fails to improve as anticipated.  I provided 45 minutes of non-face-to-face time during this encounter.   Antony Contras, MD

## 2018-09-24 DIAGNOSIS — Z7901 Long term (current) use of anticoagulants: Secondary | ICD-10-CM | POA: Diagnosis not present

## 2018-09-24 DIAGNOSIS — W19XXXD Unspecified fall, subsequent encounter: Secondary | ICD-10-CM | POA: Diagnosis not present

## 2018-09-24 DIAGNOSIS — I10 Essential (primary) hypertension: Secondary | ICD-10-CM | POA: Diagnosis not present

## 2018-09-24 DIAGNOSIS — I482 Chronic atrial fibrillation, unspecified: Secondary | ICD-10-CM | POA: Diagnosis not present

## 2018-09-24 DIAGNOSIS — M6281 Muscle weakness (generalized): Secondary | ICD-10-CM | POA: Diagnosis not present

## 2018-09-24 DIAGNOSIS — Z8673 Personal history of transient ischemic attack (TIA), and cerebral infarction without residual deficits: Secondary | ICD-10-CM | POA: Diagnosis not present

## 2018-09-28 DIAGNOSIS — I10 Essential (primary) hypertension: Secondary | ICD-10-CM | POA: Diagnosis not present

## 2018-09-28 DIAGNOSIS — Z8673 Personal history of transient ischemic attack (TIA), and cerebral infarction without residual deficits: Secondary | ICD-10-CM | POA: Diagnosis not present

## 2018-09-28 DIAGNOSIS — W19XXXD Unspecified fall, subsequent encounter: Secondary | ICD-10-CM | POA: Diagnosis not present

## 2018-09-28 DIAGNOSIS — I482 Chronic atrial fibrillation, unspecified: Secondary | ICD-10-CM | POA: Diagnosis not present

## 2018-09-28 DIAGNOSIS — Z7901 Long term (current) use of anticoagulants: Secondary | ICD-10-CM | POA: Diagnosis not present

## 2018-09-28 DIAGNOSIS — M6281 Muscle weakness (generalized): Secondary | ICD-10-CM | POA: Diagnosis not present

## 2018-09-30 ENCOUNTER — Inpatient Hospital Stay: Payer: Medicare Other | Admitting: Adult Health

## 2018-09-30 DIAGNOSIS — Z8673 Personal history of transient ischemic attack (TIA), and cerebral infarction without residual deficits: Secondary | ICD-10-CM | POA: Diagnosis not present

## 2018-09-30 DIAGNOSIS — I482 Chronic atrial fibrillation, unspecified: Secondary | ICD-10-CM | POA: Diagnosis not present

## 2018-09-30 DIAGNOSIS — I10 Essential (primary) hypertension: Secondary | ICD-10-CM | POA: Diagnosis not present

## 2018-09-30 DIAGNOSIS — M6281 Muscle weakness (generalized): Secondary | ICD-10-CM | POA: Diagnosis not present

## 2018-09-30 DIAGNOSIS — Z7901 Long term (current) use of anticoagulants: Secondary | ICD-10-CM | POA: Diagnosis not present

## 2018-09-30 DIAGNOSIS — W19XXXD Unspecified fall, subsequent encounter: Secondary | ICD-10-CM | POA: Diagnosis not present

## 2018-10-01 DIAGNOSIS — I10 Essential (primary) hypertension: Secondary | ICD-10-CM | POA: Diagnosis not present

## 2018-10-01 DIAGNOSIS — W19XXXD Unspecified fall, subsequent encounter: Secondary | ICD-10-CM | POA: Diagnosis not present

## 2018-10-01 DIAGNOSIS — I482 Chronic atrial fibrillation, unspecified: Secondary | ICD-10-CM | POA: Diagnosis not present

## 2018-10-01 DIAGNOSIS — Z8673 Personal history of transient ischemic attack (TIA), and cerebral infarction without residual deficits: Secondary | ICD-10-CM | POA: Diagnosis not present

## 2018-10-01 DIAGNOSIS — Z7901 Long term (current) use of anticoagulants: Secondary | ICD-10-CM | POA: Diagnosis not present

## 2018-10-01 DIAGNOSIS — M6281 Muscle weakness (generalized): Secondary | ICD-10-CM | POA: Diagnosis not present

## 2018-10-04 DIAGNOSIS — I4891 Unspecified atrial fibrillation: Secondary | ICD-10-CM | POA: Diagnosis not present

## 2018-10-04 DIAGNOSIS — I639 Cerebral infarction, unspecified: Secondary | ICD-10-CM | POA: Diagnosis not present

## 2018-10-04 DIAGNOSIS — J309 Allergic rhinitis, unspecified: Secondary | ICD-10-CM | POA: Diagnosis not present

## 2018-10-04 DIAGNOSIS — K219 Gastro-esophageal reflux disease without esophagitis: Secondary | ICD-10-CM | POA: Diagnosis not present

## 2018-10-04 DIAGNOSIS — I10 Essential (primary) hypertension: Secondary | ICD-10-CM | POA: Diagnosis not present

## 2018-10-05 DIAGNOSIS — M6281 Muscle weakness (generalized): Secondary | ICD-10-CM | POA: Diagnosis not present

## 2018-10-05 DIAGNOSIS — I10 Essential (primary) hypertension: Secondary | ICD-10-CM | POA: Diagnosis not present

## 2018-10-05 DIAGNOSIS — Z8673 Personal history of transient ischemic attack (TIA), and cerebral infarction without residual deficits: Secondary | ICD-10-CM | POA: Diagnosis not present

## 2018-10-05 DIAGNOSIS — Z7901 Long term (current) use of anticoagulants: Secondary | ICD-10-CM | POA: Diagnosis not present

## 2018-10-05 DIAGNOSIS — W19XXXD Unspecified fall, subsequent encounter: Secondary | ICD-10-CM | POA: Diagnosis not present

## 2018-10-05 DIAGNOSIS — I482 Chronic atrial fibrillation, unspecified: Secondary | ICD-10-CM | POA: Diagnosis not present

## 2018-10-06 DIAGNOSIS — M6281 Muscle weakness (generalized): Secondary | ICD-10-CM | POA: Diagnosis not present

## 2018-10-06 DIAGNOSIS — Z7982 Long term (current) use of aspirin: Secondary | ICD-10-CM | POA: Diagnosis not present

## 2018-10-06 DIAGNOSIS — W19XXXD Unspecified fall, subsequent encounter: Secondary | ICD-10-CM | POA: Diagnosis not present

## 2018-10-06 DIAGNOSIS — Z8673 Personal history of transient ischemic attack (TIA), and cerebral infarction without residual deficits: Secondary | ICD-10-CM | POA: Diagnosis not present

## 2018-10-06 DIAGNOSIS — I482 Chronic atrial fibrillation, unspecified: Secondary | ICD-10-CM | POA: Diagnosis not present

## 2018-10-06 DIAGNOSIS — Z7901 Long term (current) use of anticoagulants: Secondary | ICD-10-CM | POA: Diagnosis not present

## 2018-10-06 DIAGNOSIS — I10 Essential (primary) hypertension: Secondary | ICD-10-CM | POA: Diagnosis not present

## 2018-10-13 DIAGNOSIS — I10 Essential (primary) hypertension: Secondary | ICD-10-CM | POA: Diagnosis not present

## 2018-10-13 DIAGNOSIS — Z8673 Personal history of transient ischemic attack (TIA), and cerebral infarction without residual deficits: Secondary | ICD-10-CM | POA: Diagnosis not present

## 2018-10-13 DIAGNOSIS — Z7901 Long term (current) use of anticoagulants: Secondary | ICD-10-CM | POA: Diagnosis not present

## 2018-10-13 DIAGNOSIS — W19XXXD Unspecified fall, subsequent encounter: Secondary | ICD-10-CM | POA: Diagnosis not present

## 2018-10-13 DIAGNOSIS — M6281 Muscle weakness (generalized): Secondary | ICD-10-CM | POA: Diagnosis not present

## 2018-10-13 DIAGNOSIS — I482 Chronic atrial fibrillation, unspecified: Secondary | ICD-10-CM | POA: Diagnosis not present

## 2018-10-15 DIAGNOSIS — Z8673 Personal history of transient ischemic attack (TIA), and cerebral infarction without residual deficits: Secondary | ICD-10-CM | POA: Diagnosis not present

## 2018-10-15 DIAGNOSIS — W19XXXD Unspecified fall, subsequent encounter: Secondary | ICD-10-CM | POA: Diagnosis not present

## 2018-10-15 DIAGNOSIS — M6281 Muscle weakness (generalized): Secondary | ICD-10-CM | POA: Diagnosis not present

## 2018-10-15 DIAGNOSIS — I10 Essential (primary) hypertension: Secondary | ICD-10-CM | POA: Diagnosis not present

## 2018-10-15 DIAGNOSIS — I482 Chronic atrial fibrillation, unspecified: Secondary | ICD-10-CM | POA: Diagnosis not present

## 2018-10-15 DIAGNOSIS — Z7901 Long term (current) use of anticoagulants: Secondary | ICD-10-CM | POA: Diagnosis not present

## 2018-10-22 DIAGNOSIS — M6281 Muscle weakness (generalized): Secondary | ICD-10-CM | POA: Diagnosis not present

## 2018-10-22 DIAGNOSIS — I10 Essential (primary) hypertension: Secondary | ICD-10-CM | POA: Diagnosis not present

## 2018-10-22 DIAGNOSIS — W19XXXD Unspecified fall, subsequent encounter: Secondary | ICD-10-CM | POA: Diagnosis not present

## 2018-10-22 DIAGNOSIS — Z7901 Long term (current) use of anticoagulants: Secondary | ICD-10-CM | POA: Diagnosis not present

## 2018-10-22 DIAGNOSIS — Z8673 Personal history of transient ischemic attack (TIA), and cerebral infarction without residual deficits: Secondary | ICD-10-CM | POA: Diagnosis not present

## 2018-10-22 DIAGNOSIS — I482 Chronic atrial fibrillation, unspecified: Secondary | ICD-10-CM | POA: Diagnosis not present

## 2018-10-26 DIAGNOSIS — M6281 Muscle weakness (generalized): Secondary | ICD-10-CM | POA: Diagnosis not present

## 2018-10-26 DIAGNOSIS — Z8673 Personal history of transient ischemic attack (TIA), and cerebral infarction without residual deficits: Secondary | ICD-10-CM | POA: Diagnosis not present

## 2018-10-26 DIAGNOSIS — W19XXXD Unspecified fall, subsequent encounter: Secondary | ICD-10-CM | POA: Diagnosis not present

## 2018-10-26 DIAGNOSIS — Z7901 Long term (current) use of anticoagulants: Secondary | ICD-10-CM | POA: Diagnosis not present

## 2018-10-26 DIAGNOSIS — I10 Essential (primary) hypertension: Secondary | ICD-10-CM | POA: Diagnosis not present

## 2018-10-26 DIAGNOSIS — I482 Chronic atrial fibrillation, unspecified: Secondary | ICD-10-CM | POA: Diagnosis not present

## 2018-11-23 DIAGNOSIS — L570 Actinic keratosis: Secondary | ICD-10-CM | POA: Diagnosis not present

## 2018-11-23 DIAGNOSIS — L821 Other seborrheic keratosis: Secondary | ICD-10-CM | POA: Diagnosis not present

## 2018-11-23 DIAGNOSIS — L812 Freckles: Secondary | ICD-10-CM | POA: Diagnosis not present

## 2018-11-23 DIAGNOSIS — L218 Other seborrheic dermatitis: Secondary | ICD-10-CM | POA: Diagnosis not present

## 2018-11-23 DIAGNOSIS — Z85828 Personal history of other malignant neoplasm of skin: Secondary | ICD-10-CM | POA: Diagnosis not present

## 2018-12-29 ENCOUNTER — Ambulatory Visit: Payer: Medicare Other | Admitting: Neurology

## 2018-12-29 ENCOUNTER — Encounter: Payer: Self-pay | Admitting: Neurology

## 2019-01-19 DIAGNOSIS — G4733 Obstructive sleep apnea (adult) (pediatric): Secondary | ICD-10-CM | POA: Diagnosis not present

## 2019-01-19 DIAGNOSIS — G4731 Primary central sleep apnea: Secondary | ICD-10-CM | POA: Diagnosis not present

## 2019-01-27 ENCOUNTER — Ambulatory Visit (INDEPENDENT_AMBULATORY_CARE_PROVIDER_SITE_OTHER): Payer: Medicare Other | Admitting: Neurology

## 2019-01-27 ENCOUNTER — Encounter: Payer: Self-pay | Admitting: Neurology

## 2019-01-27 ENCOUNTER — Other Ambulatory Visit: Payer: Self-pay

## 2019-01-27 VITALS — BP 146/101 | HR 87 | Temp 97.3°F | Ht 71.0 in | Wt 218.0 lb

## 2019-01-27 DIAGNOSIS — R260 Ataxic gait: Secondary | ICD-10-CM | POA: Diagnosis not present

## 2019-01-27 DIAGNOSIS — G629 Polyneuropathy, unspecified: Secondary | ICD-10-CM | POA: Diagnosis not present

## 2019-01-27 DIAGNOSIS — I639 Cerebral infarction, unspecified: Secondary | ICD-10-CM | POA: Diagnosis not present

## 2019-01-27 NOTE — Patient Instructions (Signed)
I had a long discussion with the patient regarding his new complaints of foot weakness and gait ataxia which likely due to sensory peripheral neuropathy which has not yet been evaluated.  I recommend further work-up by checking neuropathy panel labs, EMG nerve conduction study.  I advised him to use a cane at all times and to avoid falls and injuries and we talked about fall prevention precautions.  Continue Eliquis for stroke prevention and maintain strict control of hypertension with blood pressure goal below 130/90, lipids with LDL cholesterol goal below 70 mg percent and diabetes with hemoglobin A1c goal below 6.5%.  He will return for follow-up in the future in 3 months or call earlier if necessary.  Fall Prevention in the Home, Adult Falls can cause injuries. They can happen to people of all ages. There are many things you can do to make your home safe and to help prevent falls. Ask for help when making these changes, if needed. What actions can I take to prevent falls? General Instructions  Use good lighting in all rooms. Replace any light bulbs that burn out.  Turn on the lights when you go into a dark area. Use night-lights.  Keep items that you use often in easy-to-reach places. Lower the shelves around your home if necessary.  Set up your furniture so you have a clear path. Avoid moving your furniture around.  Do not have throw rugs and other things on the floor that can make you trip.  Avoid walking on wet floors.  If any of your floors are uneven, fix them.  Add color or contrast paint or tape to clearly mark and help you see: ? Any grab bars or handrails. ? First and last steps of stairways. ? Where the edge of each step is.  If you use a stepladder: ? Make sure that it is fully opened. Do not climb a closed stepladder. ? Make sure that both sides of the stepladder are locked into place. ? Ask someone to hold the stepladder for you while you use it.  If there are any pets  around you, be aware of where they are. What can I do in the bathroom?      Keep the floor dry. Clean up any water that spills onto the floor as soon as it happens.  Remove soap buildup in the tub or shower regularly.  Use non-skid mats or decals on the floor of the tub or shower.  Attach bath mats securely with double-sided, non-slip rug tape.  If you need to sit down in the shower, use a plastic, non-slip stool.  Install grab bars by the toilet and in the tub and shower. Do not use towel bars as grab bars. What can I do in the bedroom?  Make sure that you have a light by your bed that is easy to reach.  Do not use any sheets or blankets that are too big for your bed. They should not hang down onto the floor.  Have a firm chair that has side arms. You can use this for support while you get dressed. What can I do in the kitchen?  Clean up any spills right away.  If you need to reach something above you, use a strong step stool that has a grab bar.  Keep electrical cords out of the way.  Do not use floor polish or wax that makes floors slippery. If you must use wax, use non-skid floor wax. What can I do with  my stairs?  Do not leave any items on the stairs.  Make sure that you have a light switch at the top of the stairs and the bottom of the stairs. If you do not have them, ask someone to add them for you.  Make sure that there are handrails on both sides of the stairs, and use them. Fix handrails that are broken or loose. Make sure that handrails are as long as the stairways.  Install non-slip stair treads on all stairs in your home.  Avoid having throw rugs at the top or bottom of the stairs. If you do have throw rugs, attach them to the floor with carpet tape.  Choose a carpet that does not hide the edge of the steps on the stairway.  Check any carpeting to make sure that it is firmly attached to the stairs. Fix any carpet that is loose or worn. What can I do on the  outside of my home?  Use bright outdoor lighting.  Regularly fix the edges of walkways and driveways and fix any cracks.  Remove anything that might make you trip as you walk through a door, such as a raised step or threshold.  Trim any bushes or trees on the path to your home.  Regularly check to see if handrails are loose or broken. Make sure that both sides of any steps have handrails.  Install guardrails along the edges of any raised decks and porches.  Clear walking paths of anything that might make someone trip, such as tools or rocks.  Have any leaves, snow, or ice cleared regularly.  Use sand or salt on walking paths during winter.  Clean up any spills in your garage right away. This includes grease or oil spills. What other actions can I take?  Wear shoes that: ? Have a low heel. Do not wear high heels. ? Have rubber bottoms. ? Are comfortable and fit you well. ? Are closed at the toe. Do not wear open-toe sandals.  Use tools that help you move around (mobility aids) if they are needed. These include: ? Canes. ? Walkers. ? Scooters. ? Crutches.  Review your medicines with your doctor. Some medicines can make you feel dizzy. This can increase your chance of falling. Ask your doctor what other things you can do to help prevent falls. Where to find more information  Centers for Disease Control and Prevention, STEADI: https://garcia.biz/  [employer] on Aging: BrainJudge.co.uk Contact a doctor if:  You are afraid of falling at home.  You feel weak, drowsy, or dizzy at home.  You fall at home. Summary  There are many simple things that you can do to make your home safe and to help prevent falls.  Ways to make your home safe include removing tripping hazards and installing grab bars in the bathroom.  Ask for help when making these changes in your home. This information is not intended to replace advice given to you by your health care provider.  Make sure you discuss any questions you have with your health care provider. Document Released: 02/15/2009 Document Revised: 08/12/2018 Document Reviewed: 12/04/2016 Elsevier Patient Education  2020 [place].  Peripheral Neuropathy Peripheral neuropathy is a type of nerve damage. It affects nerves that carry signals between the spinal cord and the arms, legs, and the rest of the body (peripheral nerves). It does not affect nerves in the spinal cord or brain. In peripheral neuropathy, one nerve or a group of nerves may be damaged.  Peripheral neuropathy is a broad category that includes many specific nerve disorders, like diabetic neuropathy, hereditary neuropathy, and carpal tunnel syndrome. What are the causes? This condition may be caused by:  Diabetes. This is the most common cause of peripheral neuropathy.  Nerve injury.  Pressure or stress on a nerve that lasts a long time.  Lack (deficiency) of B vitamins. This can result from alcoholism, poor diet, or a restricted diet.  Infections.  Autoimmune diseases, such as rheumatoid arthritis and systemic lupus erythematosus.  Nerve diseases that are passed from parent to child (inherited).  Some medicines, such as cancer medicines (chemotherapy).  Poisonous (toxic) substances, such as lead and mercury.  Too little blood flowing to the legs.  Kidney disease.  Thyroid disease. In some cases, the cause of this condition is not known. What are the signs or symptoms? Symptoms of this condition depend on which of your nerves is damaged. Common symptoms include:  Loss of feeling (numbness) in the feet, hands, or both.  Tingling in the feet, hands, or both.  Burning pain.  Very sensitive skin.  Weakness.  Not being able to move a part of the body (paralysis).  Muscle twitching.  Clumsiness or poor coordination.  Loss of balance.  Not being able to control your bladder.  Feeling dizzy.  Sexual problems. How is this  diagnosed? Diagnosing and finding the cause of peripheral neuropathy can be difficult. Your health care provider will take your medical history and do a physical exam. A neurological exam will also be done. This involves checking things that are affected by your brain, spinal cord, and nerves (nervous system). For example, your health care provider will check your reflexes, how you move, and what you can feel. You may have other tests, such as:  Blood tests.  Electromyogram (EMG) and nerve conduction tests. These tests check nerve function and how well the nerves are controlling the muscles.  Imaging tests, such as CT scans or MRI to rule out other causes of your symptoms.  Removing a small piece of nerve to be examined in a lab (nerve biopsy). This is rare.  Removing and examining a small amount of the fluid that surrounds the brain and spinal cord (lumbar puncture). This is rare. How is this treated? Treatment for this condition may involve:  Treating the underlying cause of the neuropathy, such as diabetes, kidney disease, or vitamin deficiencies.  Stopping medicines that can cause neuropathy, such as chemotherapy.  Medicine to relieve pain. Medicines may include: ? Prescription or over-the-counter pain medicine. ? Antiseizure medicine. ? Antidepressants. ? Pain-relieving patches that are applied to painful areas of skin.  Surgery to relieve pressure on a nerve or to destroy a nerve that is causing pain.  Physical therapy to help improve movement and balance.  Devices to help you move around (assistive devices). Follow these instructions at home: Medicines  Take over-the-counter and prescription medicines only as told by your health care provider. Do not take any other medicines without first asking your health care provider.  Do not drive or use heavy machinery while taking prescription pain medicine. Lifestyle   Do not use any products that contain nicotine or tobacco,  such as cigarettes and e-cigarettes. Smoking keeps blood from reaching damaged nerves. If you need help quitting, ask your health care provider.  Avoid or limit alcohol. Too much alcohol can cause a vitamin B deficiency, and vitamin B is needed for healthy nerves.  Eat a healthy diet. This includes: ? Eating  foods that are high in fiber, such as fresh fruits and vegetables, whole grains, and beans. ? Limiting foods that are high in fat and processed sugars, such as fried or sweet foods. General instructions   If you have diabetes, work closely with your health care provider to keep your blood sugar under control.  If you have numbness in your feet: ? Check every day for signs of injury or infection. Watch for redness, warmth, and swelling. ? Wear padded socks and comfortable shoes. These help protect your feet.  Develop a good support system. Living with peripheral neuropathy can be stressful. Consider talking with a mental health specialist or joining a support group.  Use assistive devices and attend physical therapy as told by your health care provider. This may include using a walker or a cane.  Keep all follow-up visits as told by your health care provider. This is important. Contact a health care provider if:  You have new signs or symptoms of peripheral neuropathy.  You are struggling emotionally from dealing with peripheral neuropathy.  Your pain is not well-controlled. Get help right away if:  You have an injury or infection that is not healing normally.  You develop new weakness in an arm or leg.  You fall frequently. Summary  Peripheral neuropathy is when the nerves in the arms, or legs are damaged, resulting in numbness, weakness, or pain.  There are many causes of peripheral neuropathy, including diabetes, pinched nerves, vitamin deficiencies, autoimmune disease, and hereditary conditions.  Diagnosing and finding the cause of peripheral neuropathy can be  difficult. Your health care provider will take your medical history, do a physical exam, and do tests, including blood tests and nerve function tests.  Treatment involves treating the underlying cause of the neuropathy and taking medicines to help control pain. Physical therapy and assistive devices may also help. This information is not intended to replace advice given to you by your health care provider. Make sure you discuss any questions you have with your health care provider. Document Released: 04/11/2002 Document Revised: 04/03/2017 Document Reviewed: 06/30/2016 Elsevier Patient Education  2020 [employer].

## 2019-01-27 NOTE — Progress Notes (Signed)
[facility] [address] [place]. [place] [address] [phone]       OFFICE FOLLOW-UP NOTE  [name] Date of Birth:  04-May-1932 Medical Record Number:  [id number]   HPI: Virtual video visit 09/23/2018 : Steven Meadows is a 85 year old pleasant Caucasian male seen today for initial virtual video consultation for stroke.  History is obtained from the patient and review of electronic medical records.  I have personally reviewed imaging films in PACS.  He developed sudden onset of acute weakness in his legs left greater than right on 09/02/2018.  He had trouble getting up from a chair and needed to hold onto things.  He went to sleep but the next day when he woke up he fell down and noticed left arm weakness as well in his left leg weakness.  CT scan of the head in the emergency room showed no acute findings.  MRI scan was obtained which showed 1 cm right periventricular white matter infarct.  Patient had history of atrial fibrillation and was on Eliquis and had been compliant with his medication.  CT angiogram showed no emergent large vessel stenosis or occlusion.  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  Transthoracic echo showed normal ejection fraction.  Cardiac monitoring showed atrial fibrillation.  LDL cholesterol was 54 mg percent.  Hemoglobin A1c was 6.0.  Patient was discharged home and aspirin was added.  Patient states is done well since discharge.  His left-sided strength has improved.  He does use a walker to walk outdoors but can walk short distances by himself.  He is feels his balance is not completely recovered particularly when he gets up suddenly or makes quick movement.  He is currently getting home physical and occupational therapy.  His blood pressure is well controlled and today it is 135/75.  He has no new complaints.  He does have a past neurological history of right splenium  white matter lacunar infarct in August 2016 and had seen Dr. Erlinda Meadows at that time.  He had made good recovery from that Past medical history atrial fibrillation aneurysm of vertebral artery, hypertension, hyperlipidemia, lacunar stroke, low back pain, rotator cuff tear. Past surgical history cataract extraction right eye, tonsillectomy, adenoidectomy, Family history heart attack in grand parents. Social history has never smoked uses alcohol socially. Medication allergy Flonase and nabumetone  Update 01/26/2019 : He returns for follow-up today after virtual video visit 4 months ago.  He states he continues to have mild gait difficulties and imbalance.  He has had no falls or injuries.  He has been working in the yard but states his stamina is not good.  He can barely walk for about 10 minutes and then asked to rest for a few minutes.  He is not consistent with using a cane and uses mostly outdoors occasionally.  Patient is feels that he is had weakness as well as lack of sensation in his feet for the last 7 months or so.  In fact on couple of occasions while driving his foot slipped off the gas pedal accidentally.  He has in fact given up driving now.  He has not been evaluated for neuropathy.  He remains on Eliquis which is tolerating well without significant bleeding and only minor bruising.  He has discontinued aspirin.  His blood pressure is quite well controlled at home though today it is borderline in office at 146/101.  He remains on Crestor which is tolerating well without muscle aches and pains.  He denies significant decrease in appetite or weight loss.  Does not drink excessive amounts of alcohol and does not have diabetes.  He is a Steven Meadows and retired recently earlier this year. ROS:   14 system review of systems is positive for  Ataxia, imbalance, gait difficulties, tiredness, decrease stamina and all other systems negative.  PMH:  Past Medical History:  Diagnosis Date  . Abdominal aortic  atherosclerosis ([place])   . Actinic keratoses   . 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 Atrial fibrillation ([place])   . Cough   . History of mononucleosis   . HTN (hypertension)   . Hypercholesteremia   . Lacunar stroke ([place])   . Low back pain   . Olecranon bursitis   . Rotator cuff tear   . Stroke Meadows Steven Meadows)     Social History:  Social History   Socioeconomic History  . Marital status: Married    Spouse name: Not on file  . Number of children: Not on file  . Years of education: Not on file  . Highest education level: Not on file  Occupational History  . Not on file  Social Needs  . Financial resource strain: Not on file  . Food insecurity    Worry: Not on file    Inability: Not on file  . Transportation needs    Medical: Not on file    Non-medical: Not on file  Tobacco Use  . Smoking status: Never Smoker  . Smokeless tobacco: Never Used  Substance and Sexual Activity  . Alcohol use: Yes    Alcohol/week: 0.0 standard drinks    Comment: 2 drinks per night  . Drug use: No  . Sexual activity: Not on file  Lifestyle  . Physical activity    Days per week: Not on file    Minutes per session: Not on file  . Stress: Not on file  Relationships  . Social Herbalist on phone: Not on file    Gets together: Not on file    Attends religious service: Not on file    Active member of club or organization: Not on file    Attends meetings of clubs or organizations: Not on file    Relationship status: Not on file  . Intimate partner violence    Fear of current or ex partner: Not on file    Emotionally abused: Not on file    Physically abused: Not on file    Forced sexual activity: Not on file  Other Topics Concern  . Not on file  Social History Narrative  . Not on file    Medications:   Current Outpatient Medications on File Prior to Visit  Medication Sig Dispense Refill  . apixaban (ELIQUIS) 5 MG TABS tablet Take 1 tablet (5 mg total) by mouth 2 (two) times daily.  60 tablet 1  . chlorhexidine (PERIDEX) 0.12 % solution 15 mLs by Mouth Rinse route 2 (two) times daily. Uses nightly    . diltiazem (CARDIZEM CD) 240 MG 24 hr capsule Take 1 capsule (240 mg total) by mouth daily. 30 capsule 0  . ezetimibe (ZETIA) 10 MG tablet Take 10 mg by mouth daily.    . hydrALAZINE (APRESOLINE) 25 MG tablet Take 25 mg by mouth 2 (two) times daily.    . hydrocortisone 2.5 % cream     . loratadine (CLARITIN) 10 MG tablet Take  10 mg by mouth daily.    Marland Kitchen omeprazole (PRILOSEC) 40 MG capsule Take 40 mg by mouth daily.    . rosuvastatin (CRESTOR) 10 MG tablet Take 1 tablet (10 mg total) by mouth daily at 6 PM. 30 tablet 0  . triamterene-hydrochlorothiazide (MAXZIDE-25) 37.5-25 MG tablet Take 1 tablet by mouth daily.     No current facility-administered medications on file prior to visit.     Allergies:   Allergies  Allergen Reactions  . Nabumetone Other (See Comments)    Gi upset  . Flonase [Fluticasone Propionate] Other (See Comments)    Nose bleed    Physical Exam General: well developed, well nourished, seated, in no evident distress Head: head normocephalic and atraumatic.  Neck: supple with no carotid or supraclavicular bruits Cardiovascular: regular rate and rhythm, no murmurs Musculoskeletal: no deformity Skin:  no rash/petichiae Vascular:  Normal pulses all extremities Vitals:   01/27/19 1118  BP: (!) 146/101  Pulse: 87  Temp: (!) 97.3 F (36.3 C)   Neurologic Exam Mental Status: Awake and fully alert. Oriented to place and time. Recent and remote memory intact. Attention span, concentration and fund of knowledge appropriate. Mood and affect appropriate.  Cranial Nerves: Fundoscopic exam reveals sharp disc margins. Pupils equal, briskly reactive to light. Extraocular movements full without nystagmus. Visual fields full to confrontation. Hearing intact. Facial sensation intact. Face, tongue, palate moves normally and symmetrically.  Motor: Normal bulk and  tone. Normal strength in all tested extremity muscles except mild weakness of ankle dorsiflexors right greater than left..  Diminished fine finger movements on the left.  Orbits right over left upper extremity. Sensory.: intact to touch ,pinprick sensation but diminished.position and vibratory sensation from ankle down bilaterally.  Positive Romberg sign Coordination: Rapid alternating movements normal in all extremities. Finger-to-nose and heel-to-shin performed accurately bilaterally. Gait and Station: Arises from chair without difficulty. Stance is slightly broad-based. Gait demonstrates normal stride length and mild imbalance .  Not able to heel, toe and tandem walk without difficulty.  Reflexes: 1+ and symmetric. Toes downgoing.   NIHSS  0 Modified Rankin  2   ASSESSMENT: 85 year old Caucasian male with right subcortical lacunar infarct in May 2020 likely from small vessel disease even though he has atrial fibrillation and was compliant with his anticoagulation with Eliquis.  Past history of right subcortical lacunar infarct in August 2016.  Multiple vascular risk factors of atrial fibrillation, hypertension, hyperlipidemia, prior stroke.  He also has incidental 6 x 8 mm veretebrobasilar junction aneurysm aneurysm which stable from 2016.  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     PLAN: I had a long discussion with the patient regarding his new complaints of foot weakness and gait ataxia which likely due to sensory peripheral neuropathy which has not yet been evaluated.  I recommend further work-up by checking neuropathy panel labs, EMG nerve conduction study.  I advised him to use a cane at all times and to avoid falls and injuries and we talked about fall prevention precautions.  Continue Eliquis for stroke prevention and maintain strict control of hypertension with blood pressure goal below 130/90, lipids with  LDL cholesterol goal below 70 mg percent and diabetes with hemoglobin A1c goal below 6.5%.  He will return for follow-up in the future in 3 months or call earlier if necessary. Greater than 50% of time during this 25 minute visit was spent on counseling,explanation of diagnosis of stroke, neuropathy,, planning  of further management, discussion with patient and family and coordination of care Antony Contras, MD  [facility] [address] [place], [place] [address]  Phone [phone] Fax [phone] Note: This document was prepared with digital dictation and possible smart phrase technology. Any transcriptional errors that result from this process are unintentional

## 2019-01-31 LAB — NEUROPATHY PANEL
A/G Ratio: 1.4 (ref 0.7–1.7)
Albumin ELP: 4 g/dL (ref 2.9–4.4)
Alpha 1: 0.2 g/dL (ref 0.0–0.4)
Alpha 2: 1 g/dL (ref 0.4–1.0)
Angio Convert Enzyme: 57 U/L (ref 14–82)
Anti Nuclear Antibody (ANA): NEGATIVE
Beta: 1 g/dL (ref 0.7–1.3)
Gamma Globulin: 0.7 g/dL (ref 0.4–1.8)
Globulin, Total: 2.9 g/dL (ref 2.2–3.9)
Rheumatoid fact SerPl-aCnc: <10 IU/mL (ref 0.0–13.9)
Sed Rate: 2 mm/hr (ref 0–30)
TSH: 3.72 u[IU]/mL (ref 0.450–4.500)
Total Protein: 6.9 g/dL (ref 6.0–8.5)
Vit D, 25-Hydroxy: 25.5 ng/mL — ABNORMAL LOW (ref 30.0–100.0)
Vitamin B-12: 392 pg/mL (ref 232–1245)

## 2019-01-31 LAB — HEMOGLOBIN A1C
Est. average glucose Bld gHb Est-mCnc: 123 mg/dL
Hgb A1c MFr Bld: 5.9 % — ABNORMAL HIGH (ref 4.8–5.6)

## 2019-02-07 ENCOUNTER — Telehealth: Payer: Self-pay

## 2019-02-07 NOTE — Telephone Encounter (Signed)
I called pt about his neuropathy panel labs. I stated the lab work for reversible cuases of neuropathy were normal excpet for low vitamin D levels. Please follow up with Dr Inda Merlin primary physician for vitamin D replacement. I stated a fax will be sent today and to call Dr. Inda Merlin office tomorrow.Pt verbalized understanding.  ------

## 2019-02-07 NOTE — Telephone Encounter (Signed)
Lab work fax to [employer] at [phone]. Fax confirmed to PCP.

## 2019-02-07 NOTE — Telephone Encounter (Signed)
-----   Message from Garvin Fila, MD sent at 02/07/2019 12:47 PM EDT ----- Steven Meadows inform patient that lab work for reversible cuases of neuropathy were normal excpet for low vitamin D levels. Ask patient to see Dr Inda Merlin primary physician for vitamin D replacemenmt

## 2019-02-16 DIAGNOSIS — J309 Allergic rhinitis, unspecified: Secondary | ICD-10-CM | POA: Diagnosis not present

## 2019-02-16 DIAGNOSIS — B351 Tinea unguium: Secondary | ICD-10-CM | POA: Diagnosis not present

## 2019-02-16 DIAGNOSIS — K219 Gastro-esophageal reflux disease without esophagitis: Secondary | ICD-10-CM | POA: Diagnosis not present

## 2019-02-16 DIAGNOSIS — I639 Cerebral infarction, unspecified: Secondary | ICD-10-CM | POA: Diagnosis not present

## 2019-02-16 DIAGNOSIS — R7309 Other abnormal glucose: Secondary | ICD-10-CM | POA: Diagnosis not present

## 2019-02-16 DIAGNOSIS — Z79899 Other long term (current) drug therapy: Secondary | ICD-10-CM | POA: Diagnosis not present

## 2019-02-16 DIAGNOSIS — R600 Localized edema: Secondary | ICD-10-CM | POA: Diagnosis not present

## 2019-02-16 DIAGNOSIS — Z0000 Encounter for general adult medical examination without abnormal findings: Secondary | ICD-10-CM | POA: Diagnosis not present

## 2019-02-16 DIAGNOSIS — Z23 Encounter for immunization: Secondary | ICD-10-CM | POA: Diagnosis not present

## 2019-02-16 DIAGNOSIS — I10 Essential (primary) hypertension: Secondary | ICD-10-CM | POA: Diagnosis not present

## 2019-02-16 DIAGNOSIS — I4891 Unspecified atrial fibrillation: Secondary | ICD-10-CM | POA: Diagnosis not present

## 2019-02-16 DIAGNOSIS — Z1389 Encounter for screening for other disorder: Secondary | ICD-10-CM | POA: Diagnosis not present

## 2019-02-16 DIAGNOSIS — D6869 Other thrombophilia: Secondary | ICD-10-CM | POA: Diagnosis not present

## 2019-02-16 DIAGNOSIS — E559 Vitamin D deficiency, unspecified: Secondary | ICD-10-CM | POA: Diagnosis not present

## 2019-03-08 DIAGNOSIS — Z961 Presence of intraocular lens: Secondary | ICD-10-CM | POA: Diagnosis not present

## 2019-03-08 DIAGNOSIS — H5211 Myopia, right eye: Secondary | ICD-10-CM | POA: Diagnosis not present

## 2019-03-17 ENCOUNTER — Other Ambulatory Visit: Payer: Self-pay

## 2019-03-17 ENCOUNTER — Ambulatory Visit (INDEPENDENT_AMBULATORY_CARE_PROVIDER_SITE_OTHER): Payer: Medicare Other | Admitting: Neurology

## 2019-03-17 DIAGNOSIS — Z0289 Encounter for other administrative examinations: Secondary | ICD-10-CM

## 2019-03-17 DIAGNOSIS — G629 Polyneuropathy, unspecified: Secondary | ICD-10-CM

## 2019-03-17 NOTE — Progress Notes (Signed)
Full Name: Steven Meadows Gender: Male MRN #: [id number] Date of Birth: [birth year]    Visit Date: 03/17/2019 10:05 Age: 85 Years 48 Months Old Examining Physician: Sarina Ill, MD  Referring Physician: Antony Contras, MD    History: From doctor Sethi's notes patient to be evaluated for sensory neuropathy  Summary: EMG/NCS was performed on the lower extremities.  The right ulnar nerve showed decreased conduction velocity (below elbow to wrist, 46 m/s, normal greater than 49) and decreased conduction velocity (above elbow to below elbow, 42 m/s, normal greater than 49).  The right peroneal motor nerve showed delayed distal onset latency (6.7 ms, normal less than 6.5) and reduced amplitude (0.3 mV, normal greater than 2) and decreased conduction velocity (fibular head to ankle, 29 m/s, normal greater than 44) and decreased conduction velocity (popliteal fossa to fibular head, 31 m/s, normal greater than 44).  The left peroneal motor nerve showed delayed distal onset latency (6.7 ms, normal less than 6.5) and reduced amplitude (0.3 mV, normal greater than 2) and decreased conduction velocity (fibular head to ankle, 32 m/s, normal greater than 44) and decreased conduction velocity (popliteal fossa to fibular head, 38 m/s, normal greater than 44).  The right tibial and left tibial motor nerves showed no response.  The right sural sensory nerve showed no response.  The right superficial peroneal sensory nerve showed no response.  The left superficial peroneal nerve showed no response.  The right ulnar orthodromic sensory nerve showed reduced amplitude (3 V, normal greater than 5).  The right ulnar ADM nerve showed delayed F-wave latency (33.7 ms, normal less than 32).  The right extensor hallucis muscle showed increased spontaneous activity and reduced motor unit recruitment.  The right tibialis anterior muscle showed increased motor unit amplitude and reduced motor unit recruitment.  The right abductor  hallucis showed decreased insertional activity. All remaining muscles (as indicated in the following tables) were within normal limits.     Conclusion: There is electrophysiologic evidence for length dependent, severe, sensorimotor axonal polyneuropathy.   Sarina Ill M.D.  [facility] [place], [place] [address] Tel: [phone] Fax: [phone]        [facility]    Nerve / Sites Muscle Latency Ref. Amplitude Ref. Rel Amp Segments Distance Velocity Ref. Area    ms ms mV mV %  cm m/s m/s mVms  R Ulnar - ADM     Wrist ADM 2.3 ?3.3 8.9 ?6.0 100 Wrist - ADM 7   30.1     B.Elbow ADM 6.9  8.7  97.2 B.Elbow - Wrist 21 46 ?49 29.7     A.Elbow ADM 9.3  8.5  97.7 A.Elbow - B.Elbow 10 42 ?49 29.9         A.Elbow - Wrist      R Peroneal - EDB     Ankle EDB 6.7 ?6.5 0.3 ?2.0 100 Ankle - EDB 9   1.3     Fib head EDB 17.7  0.2  68.6 Fib head - Ankle 32 29 ?44 0.6     Pop fossa EDB 20.9  0.2  91.4 Pop fossa - Fib head 10 31 ?44 0.6         Pop fossa - Ankle      L Peroneal - EDB     Ankle EDB 6.7 ?6.5 0.3 ?2.0 100 Ankle - EDB 9   1.3     Fib head EDB 16.7  0.2  61.7 Fib  head - Ankle 32 32 ?44 0.3     Pop fossa EDB 19.3  0.1  88.3 Pop fossa - Fib head 10 38 ?44 0.3         Pop fossa - Ankle      R Tibial - AH     Ankle AH NR ?5.8 NR ?4.0 NR Ankle - AH 9   NR     Pop fossa AH NR  NR  NR Pop fossa - Ankle 41 NR ?41 NR  L Tibial - AH     Ankle AH NR ?5.8 NR ?4.0 NR Ankle - AH 9   NR     Pop fossa AH NR  NR  NR Pop fossa - Ankle 41 NR ?41 NR               SNC    Nerve / Sites Rec. Site Peak Lat Ref.  Amp Ref. Segments Distance    ms ms V V  cm  R Sural - Ankle (Calf)     Calf Ankle NR ?4.4 NR ?6 Calf - Ankle 14  R Superficial peroneal - Ankle     Lat leg Ankle NR ?4.4 NR ?6 Lat leg - Ankle 14  L Superficial peroneal - Ankle     Lat leg Ankle NR ?4.4 NR ?6 Lat leg - Ankle 14  R Ulnar - Orthodromic, (Dig V, Mid palm)     Dig V Wrist 3.0 ?3.1 3 ?5 Dig V - Wrist  45               F  Wave    Nerve F Lat Ref.   ms ms  R Ulnar - ADM 33.7 ?32.0       EMG full       EMG Summary Table    Spontaneous MUAP Recruitment  Muscle IA Fib PSW Fasc Other Amp Dur. Poly Pattern  R. Vastus medialis Normal None None None _______ Normal Normal Normal Normal  R. Tibialis anterior Normal None None None _______ Increased Normal Normal Reduced  R. Gastrocnemius (Medial head) Normal None None None _______ Normal Normal Normal Normal  R. Extensor hallucis longus Normal None 3+ None _______ Normal Normal Normal Reduced  R. Abductor hallucis Decreased None None None _______ Normal Normal Normal Normal  R. Iliopsoas Normal None None None _______ Normal Normal Normal Normal

## 2019-03-20 NOTE — Procedures (Signed)
Full Name: Steven Meadows Gender: Male MRN #: [id number] Date of Birth: 29-Sep-1931    Visit Date: 03/17/2019 10:05 Age: 85 Years 70 Months Old Examining Physician: Sarina Ill, MD  Referring Physician: Antony Contras, MD    History: From doctor Sethi's notes patient to be evaluated for sensory neuropathy  Summary: EMG/NCS was performed on the lower extremities.  The right ulnar nerve showed decreased conduction velocity (below elbow to wrist, 46 m/s, normal greater than 49) and decreased conduction velocity (above elbow to below elbow, 42 m/s, normal greater than 49).  The right peroneal motor nerve showed delayed distal onset latency (6.7 ms, normal less than 6.5) and reduced amplitude (0.3 mV, normal greater than 2) and decreased conduction velocity (fibular head to ankle, 29 m/s, normal greater than 44) and decreased conduction velocity (popliteal fossa to fibular head, 31 m/s, normal greater than 44).  The left peroneal motor nerve showed delayed distal onset latency (6.7 ms, normal less than 6.5) and reduced amplitude (0.3 mV, normal greater than 2) and decreased conduction velocity (fibular head to ankle, 32 m/s, normal greater than 44) and decreased conduction velocity (popliteal fossa to fibular head, 38 m/s, normal greater than 44).  The right tibial and left tibial motor nerves showed no response.  The right sural sensory nerve showed no response.  The right superficial peroneal sensory nerve showed no response.  The left superficial peroneal nerve showed no response.  The right ulnar orthodromic sensory nerve showed reduced amplitude (3 V, normal greater than 5).  The right ulnar ADM nerve showed delayed F-wave latency (33.7 ms, normal less than 32).  The right extensor hallucis muscle showed increased spontaneous activity and reduced motor unit recruitment.  The right tibialis anterior muscle showed increased motor unit amplitude and reduced motor unit recruitment.  The right abductor  hallucis showed decreased insertional activity. All remaining muscles (as indicated in the following tables) were within normal limits.     Conclusion: There is electrophysiologic evidence for length dependent, severe, sensorimotor axonal polyneuropathy.   Sarina Ill M.D.  [facility] [place], [place] [address] Tel: [phone] Fax: [phone]        [facility]    Nerve / Sites Muscle Latency Ref. Amplitude Ref. Rel Amp Segments Distance Velocity Ref. Area    ms ms mV mV %  cm m/s m/s mVms  R Ulnar - ADM     Wrist ADM 2.3 ?3.3 8.9 ?6.0 100 Wrist - ADM 7   30.1     B.Elbow ADM 6.9  8.7  97.2 B.Elbow - Wrist 21 46 ?49 29.7     A.Elbow ADM 9.3  8.5  97.7 A.Elbow - B.Elbow 10 42 ?49 29.9         A.Elbow - Wrist      R Peroneal - EDB     Ankle EDB 6.7 ?6.5 0.3 ?2.0 100 Ankle - EDB 9   1.3     Fib head EDB 17.7  0.2  68.6 Fib head - Ankle 32 29 ?44 0.6     Pop fossa EDB 20.9  0.2  91.4 Pop fossa - Fib head 10 31 ?44 0.6         Pop fossa - Ankle      L Peroneal - EDB     Ankle EDB 6.7 ?6.5 0.3 ?2.0 100 Ankle - EDB 9   1.3     Fib head EDB 16.7  0.2  61.7 Fib  head - Ankle 32 32 ?44 0.3     Pop fossa EDB 19.3  0.1  88.3 Pop fossa - Fib head 10 38 ?44 0.3         Pop fossa - Ankle      R Tibial - AH     Ankle AH NR ?5.8 NR ?4.0 NR Ankle - AH 9   NR     Pop fossa AH NR  NR  NR Pop fossa - Ankle 41 NR ?41 NR  L Tibial - AH     Ankle AH NR ?5.8 NR ?4.0 NR Ankle - AH 9   NR     Pop fossa AH NR  NR  NR Pop fossa - Ankle 41 NR ?41 NR               SNC    Nerve / Sites Rec. Site Peak Lat Ref.  Amp Ref. Segments Distance    ms ms V V  cm  R Sural - Ankle (Calf)     Calf Ankle NR ?4.4 NR ?6 Calf - Ankle 14  R Superficial peroneal - Ankle     Lat leg Ankle NR ?4.4 NR ?6 Lat leg - Ankle 14  L Superficial peroneal - Ankle     Lat leg Ankle NR ?4.4 NR ?6 Lat leg - Ankle 14  R Ulnar - Orthodromic, (Dig V, Mid palm)     Dig V Wrist 3.0 ?3.1 3 ?5 Dig V - Wrist  47               F  Wave    Nerve F Lat Ref.   ms ms  R Ulnar - ADM 33.7 ?32.0       EMG full       EMG Summary Table    Spontaneous MUAP Recruitment  Muscle IA Fib PSW Fasc Other Amp Dur. Poly Pattern  R. Vastus medialis Normal None None None _______ Normal Normal Normal Normal  R. Tibialis anterior Normal None None None _______ Increased Normal Normal Reduced  R. Gastrocnemius (Medial head) Normal None None None _______ Normal Normal Normal Normal  R. Extensor hallucis longus Normal None 3+ None _______ Normal Normal Normal Reduced  R. Abductor hallucis Decreased None None None _______ Normal Normal Normal Normal  R. Iliopsoas Normal None None None _______ Normal Normal Normal Normal

## 2019-03-20 NOTE — Progress Notes (Signed)
See procedure note.

## 2019-03-21 ENCOUNTER — Telehealth: Payer: Self-pay

## 2019-03-21 NOTE — Telephone Encounter (Signed)
Pt has returned the call to Dr Leonie Man, he is asking for a call back

## 2019-03-21 NOTE — Progress Notes (Signed)
I called the patient's home number and left message on answering machine to call me back to discuss the results of nerve conduction study.

## 2019-03-21 NOTE — Telephone Encounter (Signed)
I tried to call pt twice and phone goes straight to vm.

## 2019-03-21 NOTE — Telephone Encounter (Signed)
03/21/2019 8:41 AM Encounter Date: 03/17/2019 Status: Signed  Editor: Garvin Fila, MD (Physician)     I called the patient's home number and left message on answering machine to call me back to discuss the results of nerve conduction study.

## 2019-03-25 NOTE — Progress Notes (Signed)
I have called the patient multiple times but unable to speak to him about results

## 2019-03-28 NOTE — Telephone Encounter (Signed)
Author: Garvin Fila, MD Service: Neurology Author Type: Physician           Show[]  I have called the patient multiple times but unable to speak to him about results

## 2019-05-21 ENCOUNTER — Ambulatory Visit: Payer: Medicare Other | Attending: Internal Medicine

## 2019-05-21 DIAGNOSIS — Z23 Encounter for immunization: Secondary | ICD-10-CM

## 2019-05-21 NOTE — Progress Notes (Signed)
   Covid-19 Vaccination Clinic  Name:  Steven Meadows    MRN: [id number] DOB: 28-Nov-1931  05/21/2019  [name] was observed post Covid-19 immunization for 30 minutes based on pre-vaccination screening without incidence. He was provided with Vaccine Information Sheet and instruction to access the V-Safe system.   [name] was instructed to call 911 with any severe reactions post vaccine: Marland Kitchen Difficulty breathing  . Swelling of your face and throat  . A fast heartbeat  . A bad rash all over your body  . Dizziness and weakness    Immunizations Administered    Name Date Dose VIS Date Route   Pfizer COVID-19 Vaccine 05/21/2019 11:14 AM 0.3 mL 04/15/2019 Intramuscular   Manufacturer: [employer], [employer]   Lot: [id number]   [place]: [id number]

## 2019-05-26 DIAGNOSIS — L570 Actinic keratosis: Secondary | ICD-10-CM | POA: Diagnosis not present

## 2019-05-26 DIAGNOSIS — L812 Freckles: Secondary | ICD-10-CM | POA: Diagnosis not present

## 2019-05-26 DIAGNOSIS — L821 Other seborrheic keratosis: Secondary | ICD-10-CM | POA: Diagnosis not present

## 2019-05-26 DIAGNOSIS — L111 Transient acantholytic dermatosis [Grover]: Secondary | ICD-10-CM | POA: Diagnosis not present

## 2019-05-26 DIAGNOSIS — Z85828 Personal history of other malignant neoplasm of skin: Secondary | ICD-10-CM | POA: Diagnosis not present

## 2019-06-11 ENCOUNTER — Ambulatory Visit: Payer: Medicare Other | Attending: Internal Medicine

## 2019-06-11 DIAGNOSIS — Z23 Encounter for immunization: Secondary | ICD-10-CM | POA: Insufficient documentation

## 2019-06-11 NOTE — Progress Notes (Signed)
   Covid-19 Vaccination Clinic  Name:  Steven Meadows    MRN: [id number] DOB: Feb 03, 1932  06/11/2019  [name] was observed post Covid-19 immunization for 15 minutes without incidence. He was provided with Vaccine Information Sheet and instruction to access the V-Safe system.   [name] was instructed to call 911 with any severe reactions post vaccine: Marland Kitchen Difficulty breathing  . Swelling of your face and throat  . A fast heartbeat  . A bad rash all over your body  . Dizziness and weakness    Immunizations Administered    Name Date Dose VIS Date Route   Pfizer COVID-19 Vaccine 06/11/2019 11:42 AM 0.3 mL 04/15/2019 Intramuscular   Manufacturer: [place]   Lot: [id number]   [place]: [id number]

## 2019-08-16 DIAGNOSIS — I4891 Unspecified atrial fibrillation: Secondary | ICD-10-CM | POA: Diagnosis not present

## 2019-08-16 DIAGNOSIS — R7301 Impaired fasting glucose: Secondary | ICD-10-CM | POA: Diagnosis not present

## 2019-08-16 DIAGNOSIS — D6869 Other thrombophilia: Secondary | ICD-10-CM | POA: Diagnosis not present

## 2019-08-16 DIAGNOSIS — I639 Cerebral infarction, unspecified: Secondary | ICD-10-CM | POA: Diagnosis not present

## 2019-08-16 DIAGNOSIS — Z79899 Other long term (current) drug therapy: Secondary | ICD-10-CM | POA: Diagnosis not present

## 2019-08-16 DIAGNOSIS — B351 Tinea unguium: Secondary | ICD-10-CM | POA: Diagnosis not present

## 2019-08-16 DIAGNOSIS — I10 Essential (primary) hypertension: Secondary | ICD-10-CM | POA: Diagnosis not present

## 2019-08-16 DIAGNOSIS — R600 Localized edema: Secondary | ICD-10-CM | POA: Diagnosis not present

## 2019-08-16 DIAGNOSIS — K219 Gastro-esophageal reflux disease without esophagitis: Secondary | ICD-10-CM | POA: Diagnosis not present

## 2019-08-16 DIAGNOSIS — E559 Vitamin D deficiency, unspecified: Secondary | ICD-10-CM | POA: Diagnosis not present

## 2019-08-16 DIAGNOSIS — J309 Allergic rhinitis, unspecified: Secondary | ICD-10-CM | POA: Diagnosis not present

## 2019-08-18 ENCOUNTER — Other Ambulatory Visit: Payer: Self-pay

## 2019-08-18 ENCOUNTER — Encounter: Payer: Self-pay | Admitting: Cardiology

## 2019-08-18 ENCOUNTER — Ambulatory Visit (INDEPENDENT_AMBULATORY_CARE_PROVIDER_SITE_OTHER): Payer: Medicare Other | Admitting: Cardiology

## 2019-08-18 VITALS — BP 142/88 | HR 76 | Ht 71.0 in | Wt 220.4 lb

## 2019-08-18 DIAGNOSIS — I63431 Cerebral infarction due to embolism of right posterior cerebral artery: Secondary | ICD-10-CM

## 2019-08-18 DIAGNOSIS — N289 Disorder of kidney and ureter, unspecified: Secondary | ICD-10-CM

## 2019-08-18 DIAGNOSIS — I4891 Unspecified atrial fibrillation: Secondary | ICD-10-CM

## 2019-08-18 DIAGNOSIS — N183 Chronic kidney disease, stage 3 unspecified: Secondary | ICD-10-CM | POA: Diagnosis not present

## 2019-08-18 DIAGNOSIS — I10 Essential (primary) hypertension: Secondary | ICD-10-CM | POA: Diagnosis not present

## 2019-08-18 DIAGNOSIS — I482 Chronic atrial fibrillation, unspecified: Secondary | ICD-10-CM | POA: Diagnosis not present

## 2019-08-18 DIAGNOSIS — E785 Hyperlipidemia, unspecified: Secondary | ICD-10-CM

## 2019-08-18 DIAGNOSIS — Z8673 Personal history of transient ischemic attack (TIA), and cerebral infarction without residual deficits: Secondary | ICD-10-CM | POA: Diagnosis not present

## 2019-08-18 DIAGNOSIS — Z7901 Long term (current) use of anticoagulants: Secondary | ICD-10-CM

## 2019-08-18 NOTE — Assessment & Plan Note (Signed)
Controlled.  

## 2019-08-18 NOTE — Assessment & Plan Note (Signed)
Last SCr 1.3 with GFR of 50

## 2019-08-18 NOTE — Assessment & Plan Note (Signed)
H/O recurrent CVA- 1st 20 years ago, next 2016, and most recently May 2020

## 2019-08-18 NOTE — Progress Notes (Signed)
Cardiology Office Note:    Date:  08/18/2019   ID:  CIAN LABRIE, DOB 12/06/1931, MRN [id number]  PCP:  Josetta Huddle, MD  Cardiologist:  No primary care provider on file.  Electrophysiologist:  None   Referring MD: Josetta Huddle, MD   No chief complaint on file.   History of Present Illness:    Steven Meadows is a pleasant 86 y.o. male with a hx of PAF and prior CVA. The patient reportedly had a CVA 46 yrs ago without residual.  In 2016 the patient presented with CVA and was found to be in atrial fibrillation which was new for him.  He was placed on Cardizem for rate control and Eliquis.  Echocardiogram revealed normal LV function with an EF of 50 to 55%.  Nuclear study done in 2016 showed an ejection fraction of 60% with no ischemia.  Carotid Dopplers done in 2016 showed 1 to 39% bilateral stenosis.  He did well after that.  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  The patient did well until May 2020 when he again presented with another stroke.  He says he woke up and went to use the bathroom at 3 AM but had lost the use of his left leg.  He fell on the floor.  When his wife came to get him she tripped and fell and broke her wrist.  They ended up going to the hospital together in the ambulance.  He was worked up by neurology in the hospital, he was not seen by cardiology.  He has residual left-sided weakness since May 2020 and now uses a cane.  He denies any cardiac issues.  He is unaware of atrial fibrillation.  He denies any shortness of breath. Echo 09/04/2018-EF 60-65%.   Past Medical History:  Diagnosis Date  . Abdominal aortic atherosclerosis ([place])   . Actinic keratoses   . 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 Atrial fibrillation ([place])   . Cough   . History of mononucleosis   . HTN (hypertension)   . Hypercholesteremia   . Lacunar stroke ([place])   . Low back pain   . Olecranon bursitis   . Rotator cuff tear   . Stroke [facility])     Past Surgical History:   Procedure Laterality Date  . CATARACT EXTRACTION--right eye  2013  . TONSILLECTOMY AND ADENOIDECTOMY      Current Medications: Current Meds  Medication Sig  . apixaban (ELIQUIS) 5 MG TABS tablet Take 1 tablet (5 mg total) by mouth 2 (two) times daily.  . chlorhexidine (PERIDEX) 0.12 % solution 15 mLs by Mouth Rinse route 2 (two) times daily. Uses nightly  . diltiazem (CARDIZEM CD) 240 MG 24 hr capsule Take 1 capsule (240 mg total) by mouth daily.  Marland Kitchen ezetimibe (ZETIA) 10 MG tablet Take 10 mg by mouth daily.  . hydrALAZINE (APRESOLINE) 25 MG tablet Take 25 mg by mouth 2 (two) times daily.  . hydrocortisone 2.5 % cream   . loratadine (CLARITIN) 10 MG tablet Take 10 mg by mouth daily.  Marland Kitchen omeprazole (PRILOSEC) 40 MG capsule Take 40 mg by mouth daily.  . rosuvastatin (CRESTOR) 10 MG tablet Take 1 tablet (10 mg total) by mouth daily at 6 PM.  . triamterene-hydrochlorothiazide (MAXZIDE-25) 37.5-25 MG tablet Take 1 tablet by mouth daily.     Allergies:   Nabumetone and Flonase [fluticasone propionate]   Social History   Socioeconomic History  . Marital status:  Married    Spouse name: Not on file  . Number of children: Not on file  . Years of education: Not on file  . Highest education level: Not on file  Occupational History  . Not on file  Tobacco Use  . Smoking status: Never Smoker  . Smokeless tobacco: Never Used  Substance and Sexual Activity  . Alcohol use: Yes    Alcohol/week: 0.0 standard drinks    Comment: 2 drinks per night  . Drug use: No  . Sexual activity: Not on file  Other Topics Concern  . Not on file  Social History Narrative  . Not on file   Social Determinants of Health   Financial Resource Strain:   . Difficulty of Paying Living Expenses:   Food Insecurity:   . Worried About Charity fundraiser in the Last Year:   . Arboriculturist in the Last Year:   Transportation Needs:   . Film/video editor (Medical):   Marland Kitchen Lack of Transportation  (Non-Medical):   Physical Activity:   . Days of Exercise per Week:   . Minutes of Exercise per Session:   Stress:   . Feeling of Stress :   Social Connections:   . Frequency of Communication with Friends and Family:   . Frequency of Social Gatherings with Friends and Family:   . Attends Religious Services:   . Active Member of Clubs or Organizations:   . Attends Archivist Meetings:   Marland Kitchen Marital Status:      Family History: The patient's family history includes Cancer in his paternal grandmother; Healthy in his father, mother, and sister; Heart attack in his maternal grandfather and paternal grandfather; Transient ischemic attack in his mother.  ROS:   Please see the history of present illness.     All other systems reviewed and are negative.  EKGs/Labs/Other Studies Reviewed:    The following studies were reviewed today: Echo May 2020  EKG:  EKG is ordered today.  The ekg ordered today demonstrates AF with VR 76, RBBB  Recent Labs: 09/03/2018: ALT 34; Hemoglobin 16.7; Platelets 172 09/05/2018: BUN 18; Creatinine, Ser 1.31; Potassium 3.2; Sodium 136 01/27/2019: TSH 3.720  Recent Lipid Panel    Component Value Date/Time   CHOL 120 09/04/2018 0600   TRIG 100 09/04/2018 0600   HDL 46 09/04/2018 0600   CHOLHDL 2.6 09/04/2018 0600   VLDL 20 09/04/2018 0600   LDLCALC 54 09/04/2018 0600    Physical Exam:    VS:  BP (!) 142/88   Pulse 76   Ht 5\' 11"  (1.803 m)   Wt 220 lb 6.4 oz (100 kg)   SpO2 98%   BMI 30.74 kg/m     Wt Readings from Last 3 Encounters:  08/18/19 220 lb 6.4 oz (100 kg)  01/27/19 218 lb (98.9 kg)  09/03/18 215 lb (97.5 kg)     GEN:  Well nourished, well developed in no acute distress HEENT: Normal NECK: No JVD; No carotid bruits LYMPHATICS: No lymphadenopathy CARDIAC: irregularly irregular no murmurs, rubs, gallops RESPIRATORY:  Clear to auscultation without rales, wheezing or rhonchi  ABDOMEN: Soft, non-tender,  non-distended MUSCULOSKELETAL:  No edema; No deformity  SKIN: Warm and dry NEUROLOGIC:  Alert and oriented x 3, Lt LE weakness PSYCHIATRIC:  Normal affect   ASSESSMENT:    History of CVA (cerebrovascular accident) H/O recurrent CVA- 1st 20 years ago, next 2016, and most recently May 2020  Chronic atrial fibrillation (HCC) Rate  controlled  Chronic anticoagulation CHADS VASC= 6, he is on Eliquis  HLD (hyperlipidemia) On statin rx  Hypertension Controlled  Essential hypertension Controlled  Renal insufficiency Last SCr 1.3 with GFR of 50  PLAN:    Same Rx- Check BMP- we will need to decrease Eliquis dose if his SCr is > 1.5.  F/U with Dr Stanford Breed in one year  Medication Adjustments/Labs and Tests Ordered: Current medicines are reviewed at length with the patient today.  Concerns regarding medicines are outlined above.  Orders Placed This Encounter  Procedures  . Basic metabolic panel  . EKG 12-Lead   No orders of the defined types were placed in this encounter.   Patient Instructions  Medication Instructions:  Your physician recommends that you continue on your current medications as directed. Please refer to the Current Medication list given to you today.  *If you need a refill on your cardiac medications before your next appointment, please call your pharmacy*   Lab Work: Your physician recommends that you return for lab work today: BMET  If you have labs (blood work) drawn today and your tests are completely normal, you will receive your results only by: Marland Kitchen MyChart Message (if you have MyChart) OR . A paper copy in the mail If you have any lab test that is abnormal or we need to change your treatment, we will call you to review the results.    Follow-Up: At [facility], you and your health needs are our priority.  As part of our continuing mission to provide you with exceptional heart care, we have created designated Provider Care Teams.  These Care  Teams include your primary Cardiologist (physician) and Advanced Practice Providers (APPs -  Physician Assistants and Nurse Practitioners) who all work together to provide you with the care you need, when you need it.  We recommend signing up for the patient portal called "MyChart".  Sign up information is provided on this After Visit Summary.  MyChart is used to connect with patients for Virtual Visits (Telemedicine).  Patients are able to view lab/test results, encounter notes, upcoming appointments, etc.  Non-urgent messages can be sent to your provider as well.   To learn more about what you can do with MyChart, go to [email or web address].    Your next appointment:   12 month(s)  The format for your next appointment:   In Person  Provider:   You may see Kirk Ruths, MD or one of the following Advanced Practice Providers on your designated Care Team:    Kerin Ransom, PA-C  [place], [place]  Coletta Memos, [place]    Other Instructions Please call our office 2 months ahead of time to schedule your follow-up appointment.     Angelena Form, PA-C  08/18/2019 12:08 PM    [facility]

## 2019-08-18 NOTE — Assessment & Plan Note (Signed)
On statin rx

## 2019-08-18 NOTE — Assessment & Plan Note (Signed)
Rate controlled 

## 2019-08-18 NOTE — Assessment & Plan Note (Signed)
CHADS VASC= 6, he is on Eliquis

## 2019-08-18 NOTE — Patient Instructions (Signed)
Medication Instructions:  Your physician recommends that you continue on your current medications as directed. Please refer to the Current Medication list given to you today.  *If you need a refill on your cardiac medications before your next appointment, please call your pharmacy*   Lab Work: Your physician recommends that you return for lab work today: BMET  If you have labs (blood work) drawn today and your tests are completely normal, you will receive your results only by: Marland Kitchen MyChart Message (if you have MyChart) OR . A paper copy in the mail If you have any lab test that is abnormal or we need to change your treatment, we will call you to review the results.    Follow-Up: At [facility], you and your health needs are our priority.  As part of our continuing mission to provide you with exceptional heart care, we have created designated Provider Care Teams.  These Care Teams include your primary Cardiologist (physician) and Advanced Practice Providers (APPs -  Physician Assistants and Nurse Practitioners) who all work together to provide you with the care you need, when you need it.  We recommend signing up for the patient portal called "MyChart".  Sign up information is provided on this After Visit Summary.  MyChart is used to connect with patients for Virtual Visits (Telemedicine).  Patients are able to view lab/test results, encounter notes, upcoming appointments, etc.  Non-urgent messages can be sent to your provider as well.   To learn more about what you can do with MyChart, go to [email or web address].    Your next appointment:   12 month(s)  The format for your next appointment:   In Person  Provider:   You may see Kirk Ruths, MD or one of the following Advanced Practice Providers on your designated Care Team:    Kerin Ransom, PA-C  [place], [place]  Coletta Memos, [place]    Other Instructions Please call our office 2 months ahead of time to schedule your  follow-up appointment.

## 2019-08-19 LAB — BASIC METABOLIC PANEL
BUN/Creatinine Ratio: 11 (ref 10–24)
BUN: 14 mg/dL (ref 8–27)
CO2: 26 mmol/L (ref 20–29)
Calcium: 10 mg/dL (ref 8.6–10.2)
Chloride: 100 mmol/L (ref 96–106)
Creatinine, Ser: 1.24 mg/dL (ref 0.76–1.27)
GFR calc Af Amer: 61 mL/min/{1.73_m2} (ref >59)
GFR calc non Af Amer: 53 mL/min/{1.73_m2} — ABNORMAL LOW (ref >59)
Glucose: 103 mg/dL — ABNORMAL HIGH (ref 65–99)
Potassium: 4.3 mmol/L (ref 3.5–5.2)
Sodium: 139 mmol/L (ref 134–144)

## 2019-12-01 DIAGNOSIS — Z85828 Personal history of other malignant neoplasm of skin: Secondary | ICD-10-CM | POA: Diagnosis not present

## 2019-12-01 DIAGNOSIS — L812 Freckles: Secondary | ICD-10-CM | POA: Diagnosis not present

## 2019-12-01 DIAGNOSIS — L570 Actinic keratosis: Secondary | ICD-10-CM | POA: Diagnosis not present

## 2019-12-01 DIAGNOSIS — L821 Other seborrheic keratosis: Secondary | ICD-10-CM | POA: Diagnosis not present

## 2020-01-23 DIAGNOSIS — G4733 Obstructive sleep apnea (adult) (pediatric): Secondary | ICD-10-CM | POA: Diagnosis not present

## 2020-02-11 DIAGNOSIS — Z23 Encounter for immunization: Secondary | ICD-10-CM | POA: Diagnosis not present

## 2020-02-28 DIAGNOSIS — Z23 Encounter for immunization: Secondary | ICD-10-CM | POA: Diagnosis not present

## 2020-03-20 DIAGNOSIS — H5211 Myopia, right eye: Secondary | ICD-10-CM | POA: Diagnosis not present

## 2020-03-20 DIAGNOSIS — H01002 Unspecified blepharitis right lower eyelid: Secondary | ICD-10-CM | POA: Diagnosis not present

## 2020-03-20 DIAGNOSIS — Z961 Presence of intraocular lens: Secondary | ICD-10-CM | POA: Diagnosis not present

## 2020-03-20 DIAGNOSIS — H01005 Unspecified blepharitis left lower eyelid: Secondary | ICD-10-CM | POA: Diagnosis not present

## 2020-04-30 ENCOUNTER — Inpatient Hospital Stay (HOSPITAL_COMMUNITY): Payer: Medicare Other

## 2020-04-30 ENCOUNTER — Emergency Department (HOSPITAL_COMMUNITY): Payer: Medicare Other

## 2020-04-30 ENCOUNTER — Encounter (HOSPITAL_COMMUNITY): Payer: Self-pay | Admitting: Radiology

## 2020-04-30 ENCOUNTER — Inpatient Hospital Stay (HOSPITAL_COMMUNITY)
Admission: EM | Admit: 2020-04-30 | Discharge: 2020-05-03 | DRG: 065 | Disposition: A | Discharge status: Rehabilitation Facility | Payer: Medicare Other | Source: Ambulatory Visit | Attending: Neurology | Admitting: Neurology

## 2020-04-30 DIAGNOSIS — E1140 Type 2 diabetes mellitus with diabetic neuropathy, unspecified: Secondary | ICD-10-CM | POA: Diagnosis present

## 2020-04-30 DIAGNOSIS — I700 Atherosclerosis of aorta: Secondary | ICD-10-CM | POA: Diagnosis present

## 2020-04-30 DIAGNOSIS — I10 Essential (primary) hypertension: Secondary | ICD-10-CM | POA: Diagnosis present

## 2020-04-30 DIAGNOSIS — I739 Peripheral vascular disease, unspecified: Secondary | ICD-10-CM | POA: Diagnosis not present

## 2020-04-30 DIAGNOSIS — R4701 Aphasia: Secondary | ICD-10-CM | POA: Diagnosis present

## 2020-04-30 DIAGNOSIS — E871 Hypo-osmolality and hyponatremia: Secondary | ICD-10-CM | POA: Diagnosis present

## 2020-04-30 DIAGNOSIS — R29818 Other symptoms and signs involving the nervous system: Secondary | ICD-10-CM | POA: Diagnosis not present

## 2020-04-30 DIAGNOSIS — N179 Acute kidney failure, unspecified: Secondary | ICD-10-CM | POA: Diagnosis present

## 2020-04-30 DIAGNOSIS — R29810 Facial weakness: Secondary | ICD-10-CM | POA: Diagnosis present

## 2020-04-30 DIAGNOSIS — G629 Polyneuropathy, unspecified: Secondary | ICD-10-CM | POA: Diagnosis present

## 2020-04-30 DIAGNOSIS — Z7901 Long term (current) use of anticoagulants: Secondary | ICD-10-CM

## 2020-04-30 DIAGNOSIS — I69122 Dysarthria following nontraumatic intracerebral hemorrhage: Secondary | ICD-10-CM | POA: Diagnosis not present

## 2020-04-30 DIAGNOSIS — I619 Nontraumatic intracerebral hemorrhage, unspecified: Secondary | ICD-10-CM | POA: Diagnosis not present

## 2020-04-30 DIAGNOSIS — Z6830 Body mass index (BMI) 30.0-30.9, adult: Secondary | ICD-10-CM | POA: Diagnosis not present

## 2020-04-30 DIAGNOSIS — G3184 Mild cognitive impairment, so stated: Secondary | ICD-10-CM | POA: Diagnosis present

## 2020-04-30 DIAGNOSIS — R404 Transient alteration of awareness: Secondary | ICD-10-CM | POA: Diagnosis not present

## 2020-04-30 DIAGNOSIS — E7800 Pure hypercholesterolemia, unspecified: Secondary | ICD-10-CM | POA: Diagnosis present

## 2020-04-30 DIAGNOSIS — I6389 Other cerebral infarction: Secondary | ICD-10-CM | POA: Diagnosis not present

## 2020-04-30 DIAGNOSIS — Z823 Family history of stroke: Secondary | ICD-10-CM

## 2020-04-30 DIAGNOSIS — E1142 Type 2 diabetes mellitus with diabetic polyneuropathy: Secondary | ICD-10-CM | POA: Diagnosis present

## 2020-04-30 DIAGNOSIS — R410 Disorientation, unspecified: Secondary | ICD-10-CM | POA: Diagnosis not present

## 2020-04-30 DIAGNOSIS — I6523 Occlusion and stenosis of bilateral carotid arteries: Secondary | ICD-10-CM | POA: Diagnosis not present

## 2020-04-30 DIAGNOSIS — D6869 Other thrombophilia: Secondary | ICD-10-CM | POA: Diagnosis not present

## 2020-04-30 DIAGNOSIS — E876 Hypokalemia: Secondary | ICD-10-CM | POA: Diagnosis present

## 2020-04-30 DIAGNOSIS — Z0000 Encounter for general adult medical examination without abnormal findings: Secondary | ICD-10-CM | POA: Diagnosis not present

## 2020-04-30 DIAGNOSIS — Z20822 Contact with and (suspected) exposure to covid-19: Secondary | ICD-10-CM | POA: Diagnosis present

## 2020-04-30 DIAGNOSIS — E782 Mixed hyperlipidemia: Secondary | ICD-10-CM | POA: Diagnosis present

## 2020-04-30 DIAGNOSIS — R4702 Dysphasia: Secondary | ICD-10-CM | POA: Diagnosis present

## 2020-04-30 DIAGNOSIS — G4733 Obstructive sleep apnea (adult) (pediatric): Secondary | ICD-10-CM | POA: Diagnosis present

## 2020-04-30 DIAGNOSIS — Z8249 Family history of ischemic heart disease and other diseases of the circulatory system: Secondary | ICD-10-CM

## 2020-04-30 DIAGNOSIS — I671 Cerebral aneurysm, nonruptured: Secondary | ICD-10-CM | POA: Diagnosis not present

## 2020-04-30 DIAGNOSIS — I726 Aneurysm of vertebral artery: Secondary | ICD-10-CM | POA: Diagnosis present

## 2020-04-30 DIAGNOSIS — D689 Coagulation defect, unspecified: Secondary | ICD-10-CM | POA: Diagnosis present

## 2020-04-30 DIAGNOSIS — I618 Other nontraumatic intracerebral hemorrhage: Principal | ICD-10-CM | POA: Diagnosis present

## 2020-04-30 DIAGNOSIS — I482 Chronic atrial fibrillation, unspecified: Secondary | ICD-10-CM | POA: Diagnosis present

## 2020-04-30 DIAGNOSIS — Z7289 Other problems related to lifestyle: Secondary | ICD-10-CM

## 2020-04-30 DIAGNOSIS — Z7409 Other reduced mobility: Secondary | ICD-10-CM | POA: Diagnosis present

## 2020-04-30 DIAGNOSIS — I4819 Other persistent atrial fibrillation: Secondary | ICD-10-CM | POA: Diagnosis not present

## 2020-04-30 DIAGNOSIS — Z888 Allergy status to other drugs, medicaments and biological substances status: Secondary | ICD-10-CM

## 2020-04-30 DIAGNOSIS — I610 Nontraumatic intracerebral hemorrhage in hemisphere, subcortical: Secondary | ICD-10-CM | POA: Diagnosis not present

## 2020-04-30 DIAGNOSIS — K5901 Slow transit constipation: Secondary | ICD-10-CM | POA: Diagnosis present

## 2020-04-30 DIAGNOSIS — Z8673 Personal history of transient ischemic attack (TIA), and cerebral infarction without residual deficits: Secondary | ICD-10-CM | POA: Diagnosis not present

## 2020-04-30 DIAGNOSIS — Z79899 Other long term (current) drug therapy: Secondary | ICD-10-CM

## 2020-04-30 DIAGNOSIS — R4781 Slurred speech: Secondary | ICD-10-CM | POA: Diagnosis not present

## 2020-04-30 DIAGNOSIS — I728 Aneurysm of other specified arteries: Secondary | ICD-10-CM | POA: Diagnosis not present

## 2020-04-30 DIAGNOSIS — K5900 Constipation, unspecified: Secondary | ICD-10-CM | POA: Diagnosis present

## 2020-04-30 DIAGNOSIS — I4891 Unspecified atrial fibrillation: Secondary | ICD-10-CM | POA: Diagnosis present

## 2020-04-30 DIAGNOSIS — I629 Nontraumatic intracranial hemorrhage, unspecified: Secondary | ICD-10-CM | POA: Diagnosis not present

## 2020-04-30 DIAGNOSIS — R9082 White matter disease, unspecified: Secondary | ICD-10-CM | POA: Diagnosis not present

## 2020-04-30 DIAGNOSIS — J90 Pleural effusion, not elsewhere classified: Secondary | ICD-10-CM | POA: Diagnosis not present

## 2020-04-30 DIAGNOSIS — E875 Hyperkalemia: Secondary | ICD-10-CM | POA: Diagnosis present

## 2020-04-30 DIAGNOSIS — E669 Obesity, unspecified: Secondary | ICD-10-CM | POA: Diagnosis present

## 2020-04-30 DIAGNOSIS — I679 Cerebrovascular disease, unspecified: Secondary | ICD-10-CM | POA: Diagnosis not present

## 2020-04-30 DIAGNOSIS — R7301 Impaired fasting glucose: Secondary | ICD-10-CM | POA: Diagnosis not present

## 2020-04-30 DIAGNOSIS — Z1389 Encounter for screening for other disorder: Secondary | ICD-10-CM | POA: Diagnosis not present

## 2020-04-30 DIAGNOSIS — E785 Hyperlipidemia, unspecified: Secondary | ICD-10-CM | POA: Diagnosis not present

## 2020-04-30 DIAGNOSIS — R000 Tachycardia, unspecified: Secondary | ICD-10-CM | POA: Diagnosis not present

## 2020-04-30 DIAGNOSIS — G459 Transient cerebral ischemic attack, unspecified: Secondary | ICD-10-CM | POA: Diagnosis not present

## 2020-04-30 DIAGNOSIS — J9811 Atelectasis: Secondary | ICD-10-CM | POA: Diagnosis not present

## 2020-04-30 DIAGNOSIS — R3915 Urgency of urination: Secondary | ICD-10-CM | POA: Diagnosis present

## 2020-04-30 LAB — I-STAT CHEM 8, ED
BUN: 17 mg/dL (ref 8–23)
Calcium, Ion: 1.14 mmol/L — ABNORMAL LOW (ref 1.15–1.40)
Chloride: 96 mmol/L — ABNORMAL LOW (ref 98–111)
Creatinine, Ser: 1 mg/dL (ref 0.61–1.24)
Glucose, Bld: 106 mg/dL — ABNORMAL HIGH (ref 70–99)
HCT: 48 % (ref 39.0–52.0)
Hemoglobin: 16.3 g/dL (ref 13.0–17.0)
Potassium: 3.7 mmol/L (ref 3.5–5.1)
Sodium: 133 mmol/L — ABNORMAL LOW (ref 135–145)
TCO2: 24 mmol/L (ref 22–32)

## 2020-04-30 LAB — COMPREHENSIVE METABOLIC PANEL
ALT: 25 U/L (ref 0–44)
AST: 26 U/L (ref 15–41)
Albumin: 4.2 g/dL (ref 3.5–5.0)
Alkaline Phosphatase: 28 U/L — ABNORMAL LOW (ref 38–126)
Anion gap: 11 (ref 5–15)
BUN: 14 mg/dL (ref 8–23)
CO2: 25 mmol/L (ref 22–32)
Calcium: 9.8 mg/dL (ref 8.9–10.3)
Chloride: 96 mmol/L — ABNORMAL LOW (ref 98–111)
Creatinine, Ser: 1.11 mg/dL (ref 0.61–1.24)
GFR, Estimated: >60 mL/min (ref >60)
Glucose, Bld: 108 mg/dL — ABNORMAL HIGH (ref 70–99)
Potassium: 3.8 mmol/L (ref 3.5–5.1)
Sodium: 132 mmol/L — ABNORMAL LOW (ref 135–145)
Total Bilirubin: 1.2 mg/dL (ref 0.3–1.2)
Total Protein: 6.7 g/dL (ref 6.5–8.1)

## 2020-04-30 LAB — DIFFERENTIAL
Abs Immature Granulocytes: 0.04 10*3/uL (ref 0.00–0.07)
Basophils Absolute: 0 10*3/uL (ref 0.0–0.1)
Basophils Relative: 1 %
Eosinophils Absolute: 0.1 10*3/uL (ref 0.0–0.5)
Eosinophils Relative: 1 %
Immature Granulocytes: 1 %
Lymphocytes Relative: 21 %
Lymphs Abs: 1.6 10*3/uL (ref 0.7–4.0)
Monocytes Absolute: 1.2 10*3/uL — ABNORMAL HIGH (ref 0.1–1.0)
Monocytes Relative: 16 %
Neutro Abs: 4.4 10*3/uL (ref 1.7–7.7)
Neutrophils Relative %: 60 %

## 2020-04-30 LAB — URINALYSIS, ROUTINE W REFLEX MICROSCOPIC
Bilirubin Urine: NEGATIVE
Glucose, UA: NEGATIVE mg/dL
Hgb urine dipstick: NEGATIVE
Ketones, ur: NEGATIVE mg/dL
Leukocytes,Ua: NEGATIVE
Nitrite: NEGATIVE
Protein, ur: NEGATIVE mg/dL
Specific Gravity, Urine: 1.023 (ref 1.005–1.030)
pH: 6 (ref 5.0–8.0)

## 2020-04-30 LAB — CBC
HCT: 47.4 % (ref 39.0–52.0)
Hemoglobin: 15.7 g/dL (ref 13.0–17.0)
MCH: 28.9 pg (ref 26.0–34.0)
MCHC: 33.1 g/dL (ref 30.0–36.0)
MCV: 87.3 fL (ref 80.0–100.0)
Platelets: 182 10*3/uL (ref 150–400)
RBC: 5.43 MIL/uL (ref 4.22–5.81)
RDW: 13.2 % (ref 11.5–15.5)
WBC: 7.3 10*3/uL (ref 4.0–10.5)
nRBC: 0 % (ref 0.0–0.2)

## 2020-04-30 LAB — PROTIME-INR
INR: 1.2 (ref 0.8–1.2)
Prothrombin Time: 14.5 seconds (ref 11.4–15.2)

## 2020-04-30 LAB — RAPID URINE DRUG SCREEN, HOSP PERFORMED
Amphetamines: NOT DETECTED
Barbiturates: NOT DETECTED
Benzodiazepines: NOT DETECTED
Cocaine: NOT DETECTED
Opiates: NOT DETECTED
Tetrahydrocannabinol: NOT DETECTED

## 2020-04-30 LAB — APTT: aPTT: 39 seconds — ABNORMAL HIGH (ref 24–36)

## 2020-04-30 LAB — ETHANOL: Alcohol, Ethyl (B): <10 mg/dL (ref <10)

## 2020-04-30 LAB — SARS CORONAVIRUS 2 (TAT 6-24 HRS): SARS Coronavirus 2: NEGATIVE

## 2020-04-30 MED ORDER — CHLORHEXIDINE GLUCONATE 0.12% ORAL RINSE (MEDLINE KIT)
15.0000 mL | Freq: Every day | OROMUCOSAL | Status: DC
  Administered 2020-04-30: 15 mL via OROMUCOSAL

## 2020-04-30 MED ORDER — PANTOPRAZOLE SODIUM 40 MG IV SOLR
40.0000 mg | Freq: Every day | INTRAVENOUS | Status: DC
  Administered 2020-04-30: 40 mg via INTRAVENOUS

## 2020-04-30 MED ORDER — ACETAMINOPHEN 650 MG RE SUPP: 650.0000 mg | RECTAL | Status: DC | PRN

## 2020-04-30 MED ORDER — IOHEXOL 350 MG/ML SOLN
50.0000 mL | Freq: Once | INTRAVENOUS | Status: AC | PRN
  Administered 2020-04-30: 50 mL via INTRAVENOUS

## 2020-04-30 MED ORDER — DILTIAZEM HCL ER COATED BEADS 240 MG PO CP24
240.0000 mg | ORAL_CAPSULE | Freq: Every day | ORAL | Status: DC
  Administered 2020-05-01 – 2020-05-03 (×3): 240 mg via ORAL
  Filled 2020-04-30 (×3): qty 1

## 2020-04-30 MED ORDER — EMPTY CONTAINERS FLEXIBLE MISC
900.0000 mg | Freq: Once | Status: AC
  Administered 2020-04-30: 900 mg via INTRAVENOUS
  Filled 2020-04-30: qty 90

## 2020-04-30 MED ORDER — EZETIMIBE 10 MG PO TABS
10.0000 mg | ORAL_TABLET | Freq: Every day | ORAL | Status: DC
  Administered 2020-05-01 – 2020-05-03 (×3): 10 mg via ORAL
  Filled 2020-04-30 (×3): qty 1

## 2020-04-30 MED ORDER — ACETAMINOPHEN 325 MG PO TABS
650.0000 mg | ORAL_TABLET | ORAL | Status: DC | PRN
  Administered 2020-05-01 – 2020-05-03 (×2): 650 mg via ORAL
  Filled 2020-04-30 (×2): qty 2

## 2020-04-30 MED ORDER — STROKE: EARLY STAGES OF RECOVERY BOOK
Freq: Once | Status: AC
  Filled 2020-04-30: qty 1

## 2020-04-30 MED ORDER — CLEVIDIPINE BUTYRATE 0.5 MG/ML IV EMUL
0.0000 mg/h | INTRAVENOUS | Status: DC
  Administered 2020-04-30: 1 mg/h via INTRAVENOUS
  Filled 2020-04-30 (×2): qty 100

## 2020-04-30 MED ORDER — TRIAMTERENE-HCTZ 37.5-25 MG PO TABS
1.0000 | ORAL_TABLET | Freq: Every day | ORAL | Status: DC
  Administered 2020-05-01 – 2020-05-03 (×3): 1 via ORAL
  Filled 2020-04-30 (×3): qty 1

## 2020-04-30 MED ORDER — HYDRALAZINE HCL 25 MG PO TABS
25.0000 mg | ORAL_TABLET | Freq: Two times a day (BID) | ORAL | Status: DC
  Administered 2020-05-01 – 2020-05-03 (×5): 25 mg via ORAL
  Filled 2020-04-30 (×5): qty 1

## 2020-04-30 MED ORDER — ROSUVASTATIN CALCIUM 5 MG PO TABS: 10.0000 mg | ORAL_TABLET | Freq: Every day | ORAL | Status: DC

## 2020-04-30 MED ORDER — SENNOSIDES-DOCUSATE SODIUM 8.6-50 MG PO TABS
1.0000 | ORAL_TABLET | Freq: Two times a day (BID) | ORAL | Status: DC
  Administered 2020-05-01 – 2020-05-03 (×5): 1 via ORAL
  Filled 2020-04-30 (×5): qty 1

## 2020-04-30 MED ORDER — ACETAMINOPHEN 160 MG/5ML PO SOLN: 650.0000 mg | ORAL | Status: DC | PRN

## 2020-04-30 NOTE — Code Documentation (Signed)
Stroke Response Nurse Documentation Code Documentation  Steven Meadows is a 86 y.o. male arriving to [facility] ED via [facility] on 04-30-2020 with past medical hx of AF, CVA x 3, . Code stroke was activated by ED. Patient from home where he was LKW at 2130 last night and now complaining of confusion . On Eliquis (apixaban) daily. Stroke team at the bedside on patient arrival. Labs drawn and patient cleared for CT by Dr. Pilar Plate. Patient to CT with team. NIHSS 2, see documentation for details and code stroke times. Patient with Global aphasia  and Visual  neglect on exam. The following imaging was completed: CT, CTA head and neck. Patient is not a candidate for tPA due to hemorragh  Care/Plan. Bedside handoff with ED RN Crystal.  Maintain SBP less than 140.    [name]  Stroke Response RN

## 2020-04-30 NOTE — ED Triage Notes (Signed)
Pt bib ems from internal medicine office with concern for stroke. Pt with 3 previous strokes and currently on eliquis. Pt awoke this morning with confusion. Pt LKW at 2130 yesterday. Pt with no focal deficits, no facial droop, no unilateral weakness. Per ems, when pt at the MD office the Dr asked him to sign his name and could not follow the instructions. Pt with some expressive aphasia. Code stroke activated upon arrival to ED tx room.

## 2020-04-30 NOTE — ED Notes (Signed)
Called [facility] to activate a code stroke per dr. Pilar Plate

## 2020-04-30 NOTE — ED Provider Notes (Signed)
[facility] EMERGENCY DEPARTMENT Provider Note   CSN: [id number] Arrival date & time: 04/30/20  1544     History No chief complaint on file.   Steven Meadows is a 86 y.o. male.  HPI Patient is an 86 year old male with a history of multiple prior CVA, HTN, HLD, atrial fibrillation on Eliquis who presents to ED for stroke-like symptoms.  Per EMS, patient was noted to be confused when he woke up this morning -last known normal yesterday at 9:30 PM when he went to bed.  Patient presented to PCP earlier today and provider was concerned he was having a stroke. Found to have expressive aphasia and apraxia with EMS, with difficulty completing a signature and some continued confusion.  Sent to ED for further evaluation. On initial exam, patient denies any pain or complaints.  However, he is aphasic (unable to identify a pen).  Code stroke called on arrival.    Past Medical History:  Diagnosis Date  . Abdominal aortic atherosclerosis (HCC)   . Actinic keratoses   . 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 Atrial fibrillation (HCC)   . Cough   . History of mononucleosis   . HTN (hypertension)   . Hypercholesteremia   . Lacunar stroke (HCC)   . Low back pain   . Olecranon bursitis   . Rotator cuff tear   . Stroke [facility])     Patient Active Problem List   Diagnosis Date Noted  . Renal insufficiency 08/18/2019  . Acute CVA (cerebrovascular accident) (HCC) 09/03/2018  . Cerebrovascular accident (CVA) due to embolism of right posterior cerebral artery (HCC) 07/25/2015  . Chronic anticoagulation 01/23/2015  . HLD (hyperlipidemia) 01/23/2015  . Essential hypertension 01/23/2015  . Brain aneurysm 01/23/2015  . History of CVA (cerebrovascular accident) 12/21/2014  . Chronic atrial fibrillation (HCC) 12/21/2014  . 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  . Stroke with cerebral ischemia (HCC)   . TIA (transient ischemic attack) 12/20/2014  . Atrial fibrillation (HCC)  12/20/2014  . Hyperlipidemia 06/17/2011  . Stroke (HCC) 06/17/2011  . Cough 06/17/2011    Past Surgical History:  Procedure Laterality Date  . CATARACT EXTRACTION--right eye  2013  . TONSILLECTOMY AND ADENOIDECTOMY         Family History  Problem Relation Age of Onset  . Heart attack Paternal Grandfather   . Heart attack Maternal Grandfather   . Cancer Paternal Grandmother   . Healthy Mother   . Transient ischemic attack Mother   . Healthy Father   . Healthy Sister     Social History   Tobacco Use  . Smoking status: Never Smoker  . Smokeless tobacco: Never Used  Substance Use Topics  . Alcohol use: Yes    Alcohol/week: 0.0 standard drinks    Comment: 2 drinks per night  . Drug use: No    Home Medications Prior to Admission medications   Medication Sig Start Date End Date Taking? Authorizing Provider  apixaban (ELIQUIS) 5 MG TABS tablet Take 1 tablet (5 mg total) by mouth 2 (two) times daily. 12/23/14   Kathlen Mody, MD  chlorhexidine (PERIDEX) 0.12 % solution 15 mLs by Mouth Rinse route 2 (two) times daily. Uses nightly 08/11/18   [provider]  diltiazem (CARDIZEM CD) 240 MG 24 hr capsule Take 1 capsule (240 mg total) by mouth daily. 12/23/14   Kathlen Mody, MD  ezetimibe (ZETIA) 10 MG tablet Take 10 mg by mouth daily.    [provider]  hydrALAZINE (APRESOLINE) 25 MG tablet Take 25 mg by mouth 2 (two) times daily. 06/28/18   [provider]  hydrocortisone 2.5 % cream  12/21/18   [provider]  loratadine (CLARITIN) 10 MG tablet Take 10 mg by mouth daily.    [provider]  omeprazole (PRILOSEC) 40 MG capsule Take 40 mg by mouth daily. 07/20/15   [provider]  rosuvastatin (CRESTOR) 10 MG tablet Take 1 tablet (10 mg total) by mouth daily at 6 PM. 12/23/14   Hosie Poisson, MD  triamterene-hydrochlorothiazide (MAXZIDE-25) 37.5-25 MG tablet Take 1 tablet by mouth daily. 07/12/18   [provider]     Allergies    Nabumetone and Flonase [fluticasone propionate]  Review of Systems   Review of Systems  Unable to perform ROS: Mental status change    Physical Exam Updated Vital Signs There were no vitals taken for this visit.  Physical Exam Vitals and nursing note reviewed.  Constitutional:      General: He is not in acute distress.    Appearance: Normal appearance. He is well-developed and well-nourished. He is not ill-appearing or toxic-appearing.  HENT:     Head: Normocephalic and atraumatic.     Nose: Nose normal.     Mouth/Throat:     Mouth: Mucous membranes are moist.     Pharynx: Oropharynx is clear.  Eyes:     General: Visual field deficit (Subtle R visual field cut) present. No scleral icterus.    Extraocular Movements: Extraocular movements intact.     Pupils: Pupils are equal, round, and reactive to light.  Cardiovascular:     Rate and Rhythm: Normal rate and regular rhythm.     Pulses: Normal pulses.     Heart sounds: No murmur heard. No friction rub. No gallop.   Pulmonary:     Effort: Pulmonary effort is normal. No respiratory distress.     Breath sounds: Normal breath sounds. No wheezing, rhonchi or rales.  Abdominal:     General: There is no distension.     Palpations: Abdomen is soft.     Tenderness: There is no abdominal tenderness. There is no guarding or rebound.  Musculoskeletal:     Cervical back: Neck supple.     Right lower leg: Edema present.     Left lower leg: Edema present.     Comments: Bilateral lower leg edema, R> L  Skin:    General: Skin is warm and dry.  Neurological:     Mental Status: He is alert. He is confused.     GCS: GCS eye subscore is 4. GCS verbal subscore is 4. GCS motor subscore is 6.     Cranial Nerves: No facial asymmetry.     Sensory: Sensory deficit (Subtle L sensory neglect) present.     Motor: Motor function is intact. No weakness, tremor, seizure activity or pronator drift.     Deep Tendon Reflexes:      Reflex Scores:      Patellar reflexes are 0 on the right side and 0 on the left side.      Achilles reflexes are 0 on the right side and 0 on the left side.    Comments: Aphasia and apraxia  Psychiatric:        Mood and Affect: Mood and affect normal.     ED Results / Procedures / Treatments   Labs (all labs ordered are listed, but only abnormal results are displayed) Labs Reviewed  DIFFERENTIAL - Abnormal;  Notable for the following components:      Result Value   Monocytes Absolute 1.2 (*)    All other components within normal limits  I-STAT CHEM 8, ED - Abnormal; Notable for the following components:   Sodium 133 (*)    Chloride 96 (*)    Glucose, Bld 106 (*)    Calcium, Ion 1.14 (*)    All other components within normal limits  SARS CORONAVIRUS 2 (TAT 6-24 HRS)  CBC  ETHANOL  PROTIME-INR  APTT  COMPREHENSIVE METABOLIC PANEL  RAPID URINE DRUG SCREEN, HOSP PERFORMED  URINALYSIS, ROUTINE W REFLEX MICROSCOPIC    EKG None  Radiology CT Angio Head W or Wo Contrast  Result Date: 04/30/2020 CLINICAL DATA:  Acute neuro deficit.  Aphasia. EXAM: CT ANGIOGRAPHY HEAD AND NECK TECHNIQUE: Multidetector CT imaging of the head and neck was performed using the standard protocol during bolus administration of intravenous contrast. Multiplanar CT image reconstructions and MIPs were obtained to evaluate the vascular anatomy. Carotid stenosis measurements (when applicable) are obtained utilizing NASCET criteria, using the distal internal carotid diameter as the denominator. CONTRAST:  28mL OMNIPAQUE IOHEXOL 350 MG/ML SOLN COMPARISON:  CT head 04/30/2020.  CT angio head and neck 09/03/2018 FINDINGS: CTA NECK FINDINGS Aortic arch: Mild atherosclerotic calcification aortic arch. Proximal great vessels tortuous but widely patent. Right carotid system: Atherosclerotic calcification right carotid bifurcation without significant stenosis. Left carotid system: Atherosclerotic calcification left carotid  bifurcation without stenosis Vertebral arteries: Right vertebral artery patent to the basilar. Hypoplastic left vertebral artery ends in PICA Skeleton: Cervical spondylosis.  No acute skeletal abnormality. Other neck: Negative for mass or adenopathy in the neck. Upper chest: Lung apices clear bilaterally. Review of the MIP images confirms the above findings CTA HEAD FINDINGS Anterior circulation: Atherosclerotic calcification in the cavernous carotid bilaterally without significant stenosis. Anterior and middle cerebral arteries patent bilaterally without significant stenosis or large vessel occlusion. Posterior circulation: Left vertebral artery ends in PICA. Right vertebral artery supplies the basilar. 6 mm aneurysm with associated calcification in the wall at the vertebrobasilar junction. Mild stenosis distal right vertebral artery proximal to the aneurysm. Basilar is diminutive due to fetal origin of the posterior circulation bilaterally. No significant stenosis or large vessel occlusion in the posterior circulation Venous sinuses: Normal venous enhancement Anatomic variants: None Review of the MIP images confirms the above findings IMPRESSION: 1. Negative for intracranial large vessel occlusion 2. 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 3. Mild atherosclerotic calcification of the carotid bifurcation without significant carotid stenosis. Atherosclerotic calcification in the cavernous carotid bilaterally. Electronically Signed   By: Franchot Gallo M.D.   On: 04/30/2020 16:38   CT Angio Neck W and/or Wo Contrast  Result Date: 04/30/2020 CLINICAL DATA:  Acute neuro deficit.  Aphasia. EXAM: CT ANGIOGRAPHY HEAD AND NECK TECHNIQUE: Multidetector CT imaging of the head and neck was performed using the standard protocol during bolus administration of intravenous contrast. Multiplanar CT image reconstructions and MIPs were obtained to evaluate the vascular anatomy.  Carotid stenosis measurements (when applicable) are obtained utilizing NASCET criteria, using the distal internal carotid diameter as the denominator. CONTRAST:  34mL OMNIPAQUE IOHEXOL 350 MG/ML SOLN COMPARISON:  CT head 04/30/2020.  CT angio head and neck 09/03/2018 FINDINGS: CTA NECK FINDINGS Aortic arch: Mild atherosclerotic calcification aortic arch. Proximal great vessels tortuous but widely patent. Right carotid system: Atherosclerotic calcification right carotid bifurcation without significant stenosis. Left carotid system: Atherosclerotic calcification left carotid bifurcation without stenosis  Vertebral arteries: Right vertebral artery patent to the basilar. Hypoplastic left vertebral artery ends in PICA Skeleton: Cervical spondylosis.  No acute skeletal abnormality. Other neck: Negative for mass or adenopathy in the neck. Upper chest: Lung apices clear bilaterally. Review of the MIP images confirms the above findings CTA HEAD FINDINGS Anterior circulation: Atherosclerotic calcification in the cavernous carotid bilaterally without significant stenosis. Anterior and middle cerebral arteries patent bilaterally without significant stenosis or large vessel occlusion. Posterior circulation: Left vertebral artery ends in PICA. Right vertebral artery supplies the basilar. 6 mm aneurysm with associated calcification in the wall at the vertebrobasilar junction. Mild stenosis distal right vertebral artery proximal to the aneurysm. Basilar is diminutive due to fetal origin of the posterior circulation bilaterally. No significant stenosis or large vessel occlusion in the posterior circulation Venous sinuses: Normal venous enhancement Anatomic variants: None Review of the MIP images confirms the above findings IMPRESSION: 1. Negative for intracranial large vessel occlusion 2. 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 3. Mild atherosclerotic calcification of the carotid  bifurcation without significant carotid stenosis. Atherosclerotic calcification in the cavernous carotid bilaterally. Electronically Signed   By: Franchot Gallo M.D.   On: 04/30/2020 16:38   Chest Port 1 View  Result Date: 04/30/2020 CLINICAL DATA:  Confusion.  Intracranial hemorrhage. EXAM: PORTABLE CHEST 1 VIEW COMPARISON:  Radiograph 03/18/2011 FINDINGS: Upper normal heart size.The cardiomediastinal contours are normal. Mild aortic atherosclerosis. Subsegmental atelectasis in the left mid lung. Pulmonary vasculature is normal. No consolidation, pleural effusion, or pneumothorax. No acute osseous abnormalities are seen. IMPRESSION: Subsegmental atelectasis in the left mid lung. Electronically Signed   By: Keith Rake M.D.   On: 04/30/2020 18:22   CT HEAD CODE STROKE WO CONTRAST  Result Date: 04/30/2020 CLINICAL DATA:  Code stroke.  Neuro deficit, acute. EXAM: CT HEAD WITHOUT CONTRAST TECHNIQUE: Contiguous axial images were obtained from the base of the skull through the vertex without intravenous contrast. COMPARISON:  Head CT May 1st, 2020. FINDINGS: Brain: 6 mm hyperdense focus in the left thalamus. Gray-white differentiation is maintained. No hydrocephalus, extra-axial collection or mass lesion. Vascular: No hyperdense vessel. Calcified plaques in the bilateral carotid siphons and intracranial vertebral arteries. Skull: Normal. Negative for fracture or focal lesion. Sinuses/Orbits: No acute finding. ASPECTS [facility] Stroke Program Early CT Score) - Ganglionic level infarction (caudate, lentiform nuclei, internal capsule, insula, M1-M3 cortex): 7 - Supraganglionic infarction (M4-M6 cortex): 3 Total score (0-10 with 10 being normal): 10 IMPRESSION: 1. A 6 mm hyperdense focus in the left thalamus, which may reflect a small hemorrhage. 2. ASPECTS is 10. Findings communicated to Dr. Cheral Marker via [facility] paging system at 16:12 on 04/30/2020. Electronically Signed   By: Pedro Earls M.D.    On: 04/30/2020 16:14    Procedures Procedures (including critical care time)  Medications Ordered in ED Medications  iohexol (OMNIPAQUE) 350 MG/ML injection 50 mL (has no administration in time range)    ED Course  I have reviewed the triage vital signs and the nursing notes.  Pertinent labs & imaging results that were available during my care of the patient were reviewed by me and considered in my medical decision making (see chart for details).    MDM Rules/Calculators/A&P                          86 year old male with stroke-like symptoms. Patient is not in any acute distress, but initial exam concerning  for aphasia and persistent confusion. Pt is not hypoglycemic on EMS POC test. Patient taken to CT scanner immediately upon arrival; neurology team at bedside in CT.   Imaging reviewed and notable for likely small hemorrhage in the L thalamus. Pt will be admitted to the ICU under neurology service for further evaluation and management to include BP control and DOAC reversal. Pt remained in stable condition at time of admission.  Final Clinical Impression(s) / ED Diagnoses Final diagnoses:  ICH (intracerebral hemorrhage) ([place])     Vanna Scotland, MD [id number]    Maudie Flakes, MD 05/02/20 [phone]

## 2020-04-30 NOTE — H&P (Addendum)
Admission H&P    Chief Complaint: Acute onset of aphasia  HPI: Steven Meadows is an 86 y.o. male with a PMHx of atrial fibrillation (on Eliquis), 3 prior ischemic strokes, abdominal aortic atherosclerosis, vertebral artery aneurysm, HTN, OSA, hypercholesterolemia and rotator cuff tear who presents from home for evaluation of acute onset aphasia. LKN was 2130. This AM, his wife noted him to be "confused". She does not specifically endorse an aphasia, just confusion.   On arrival the patient has garbled speech. No lateralized weakness or other complaints, but due to patient's impaired speech he cannot provide a reliable ROS.   His last dose of Eliquis was this morning.   Past Medical History:  Diagnosis Date  . Abdominal aortic atherosclerosis ([place])   . Actinic keratoses   . 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 Atrial fibrillation ([place])   . Cough   . History of mononucleosis   . HTN (hypertension)   . Hypercholesteremia   . Lacunar stroke ([place])   . Low back pain   . Olecranon bursitis   . Rotator cuff tear   . Stroke [facility])     Past Surgical History:  Procedure Laterality Date  . CATARACT EXTRACTION--right eye  2013  . TONSILLECTOMY AND ADENOIDECTOMY      Family History  Problem Relation Age of Onset  . Heart attack Paternal Grandfather   . Heart attack Maternal Grandfather   . Cancer Paternal Grandmother   . Healthy Mother   . Transient ischemic attack Mother   . Healthy Father   . Healthy Sister    Social History:  reports that he has never smoked. He has never used smokeless tobacco. He reports current alcohol use. He reports that he does not use drugs.  Allergies:  Allergies  Allergen Reactions  . Nabumetone Other (See Comments)    Gi upset  . Flonase [Fluticasone Propionate] Other (See Comments)    Nose bleed   HOME MEDICATIONS: No current facility-administered medications on file prior to encounter.   Current Outpatient Medications on File Prior to  Encounter  Medication Sig Dispense Refill  . apixaban (ELIQUIS) 5 MG TABS tablet Take 1 tablet (5 mg total) by mouth 2 (two) times daily. 60 tablet 1  . chlorhexidine (PERIDEX) 0.12 % solution 15 mLs by Mouth Rinse route 2 (two) times daily. Uses nightly    . diltiazem (CARDIZEM CD) 240 MG 24 hr capsule Take 1 capsule (240 mg total) by mouth daily. 30 capsule 0  . ezetimibe (ZETIA) 10 MG tablet Take 10 mg by mouth daily.    . hydrALAZINE (APRESOLINE) 25 MG tablet Take 25 mg by mouth 2 (two) times daily.    . hydrocortisone 2.5 % cream     . loratadine (CLARITIN) 10 MG tablet Take 10 mg by mouth daily.    Marland Kitchen omeprazole (PRILOSEC) 40 MG capsule Take 40 mg by mouth daily.    . rosuvastatin (CRESTOR) 10 MG tablet Take 1 tablet (10 mg total) by mouth daily at 6 PM. 30 tablet 0  . triamterene-hydrochlorothiazide (MAXZIDE-25) 37.5-25 MG tablet Take 1 tablet by mouth daily.      ROS: Unable to obtain a definitive ROS due to dysphasia.   Physical Examination: Blood pressure (!) 144/78, pulse 95, temperature 98.3 F (36.8 C), temperature source Oral, resp. rate 14, SpO2 95 %.  HEENT-  [place]/AT  Lungs - Respirations unlabored Extremities - No cyanosis or pallor  Neurologic Examination: Mental Status: Awake and alert. Speech with both  receptive and expressive dysphasia. Can name some common items but not others. Word-finding difficulty noted, with halting or garbled speech at times. Repetition impaired. Can follow some simple commands but not others. Made a clear comprehension error with 3-step command. Phonemic paraphasias noted.  Cranial Nerves: II:  Temporal visual fields intact when tested individually. There is right sided extinction to DSS. PERRL.  III,IV, VI: No ptosis. EOMI. Saccadic pursuits noted.  V,VII: Facial temp and FT sensation intact bilaterally. Positive for extinction on the left with DSS. Smile symmetric VIII: Hearing intact to voice IX,X: No hypophonia XI: Symmetric XII: Midline  tongue extension Motor: RUE and RLE 5/5 LUE and LLE 5/5 No asymmetry No pronator drift.  Sensory: Temp and light touch intact bilateral upper and lower extremities when tested individually. There is extinction on the left with DSS.  Deep Tendon Reflexes:  2+ bilateral brachioradialis and biceps Unelicitable patellar and achilles reflexes Toes downgoing bilaterally Cerebellar: No ataxia with FNF bilaterally.  Gait: Deferred  Results for orders placed or performed during the hospital encounter of 04/30/20 (from the past 48 hour(s))  Protime-INR     Status: None   Collection Time: 04/30/20  4:02 PM  Result Value Ref Range   Prothrombin Time 14.5 11.4 - 15.2 seconds   INR 1.2 0.8 - 1.2    Comment: (NOTE) INR goal varies based on device and disease states. Performed at [facility], [address] [place], [place] [address]   APTT     Status: Abnormal   Collection Time: 04/30/20  4:02 PM  Result Value Ref Range   aPTT 39 (H) 24 - 36 seconds    Comment:        IF BASELINE aPTT IS ELEVATED, SUGGEST PATIENT RISK ASSESSMENT BE USED TO DETERMINE APPROPRIATE ANTICOAGULANT THERAPY. Performed at [facility], [address], [place], [place] [address]   CBC     Status: None   Collection Time: 04/30/20  4:02 PM  Result Value Ref Range   WBC 7.3 4.0 - 10.5 K/uL   RBC 5.43 4.22 - 5.81 MIL/uL   Hemoglobin 15.7 13.0 - 17.0 g/dL   HCT 56.7 01.4 - 10.3 %   MCV 87.3 80.0 - 100.0 fL   MCH 28.9 26.0 - 34.0 pg   MCHC 33.1 30.0 - 36.0 g/dL   RDW 01.3 14.3 - 88.8 %   Platelets 182 150 - 400 K/uL   nRBC 0.0 0.0 - 0.2 %    Comment: Performed at [facility], [address] [place], [place] [address]  Differential     Status: Abnormal   Collection Time: 04/30/20  4:02 PM  Result Value Ref Range   Neutrophils Relative % 60 %   Neutro Abs 4.4 1.7 - 7.7 K/uL   Lymphocytes Relative 21 %   Lymphs Abs 1.6 0.7 - 4.0 K/uL   Monocytes Relative 16 %   Monocytes Absolute 1.2 (H)  0.1 - 1.0 K/uL   Eosinophils Relative 1 %   Eosinophils Absolute 0.1 0.0 - 0.5 K/uL   Basophils Relative 1 %   Basophils Absolute 0.0 0.0 - 0.1 K/uL   Immature Granulocytes 1 %   Abs Immature Granulocytes 0.04 0.00 - 0.07 K/uL    Comment: Performed at [facility], [address], [place], [place] [address]  I-stat chem 8, ED     Status: Abnormal   Collection Time: 04/30/20  4:13 PM  Result Value Ref Range  Sodium 133 (L) 135 - 145 mmol/L   Potassium 3.7 3.5 - 5.1 mmol/L   Chloride 96 (L) 98 - 111 mmol/L   BUN 17 8 - 23 mg/dL   Creatinine, Ser 3.23 0.61 - 1.24 mg/dL   Glucose, Bld 557 (H) 70 - 99 mg/dL    Comment: Glucose reference range applies only to samples taken after fasting for at least 8 hours.   Calcium, Ion 1.14 (L) 1.15 - 1.40 mmol/L   TCO2 24 22 - 32 mmol/L   Hemoglobin 16.3 13.0 - 17.0 g/dL   HCT 32.2 02.5 - 42.7 %   CT HEAD CODE STROKE WO CONTRAST  Result Date: 04/30/2020 CLINICAL DATA:  Code stroke.  Neuro deficit, acute. EXAM: CT HEAD WITHOUT CONTRAST TECHNIQUE: Contiguous axial images were obtained from the base of the skull through the vertex without intravenous contrast. COMPARISON:  Head CT May 1st, 2020. FINDINGS: Brain: 6 mm hyperdense focus in the left thalamus. Gray-white differentiation is maintained. No hydrocephalus, extra-axial collection or mass lesion. Vascular: No hyperdense vessel. Calcified plaques in the bilateral carotid siphons and intracranial vertebral arteries. Skull: Normal. Negative for fracture or focal lesion. Sinuses/Orbits: No acute finding. ASPECTS [facility] Stroke Program Early CT Score) - Ganglionic level infarction (caudate, lentiform nuclei, internal capsule, insula, M1-M3 cortex): 7 - Supraganglionic infarction (M4-M6 cortex): 3 Total score (0-10 with 10 being normal): 10 IMPRESSION: 1. A 6 mm hyperdense focus in the left thalamus, which may reflect a small hemorrhage. 2. ASPECTS is 10. Findings communicated to Dr. Otelia Limes via [facility]  paging system at 16:12 on 04/30/2020. Electronically Signed   By: Baldemar Lenis M.D.   On: 04/30/2020 16:14   Assessment: 86 year old male presenting with acute subcentimeter left thalamic hemorrhage 1. Exam reveals receptive and expressive dysphasia with right visual neglect but no hemianopsia. 2. CT head: A 6 mm hyperdense focus in the left thalamus, which may reflect a small hemorrhage. ASPECTS is 10 3. CTA of head and neck: Negative for intracranial large vessel occlusion. There is a 6 mm aneurysm of the right vertebrobasilar junction unchanged from the prior study. Mild atherosclerotic calcification of the carotid bifurcation without significant carotid stenosis. Atherosclerotic calcification in the cavernous carotid bilaterally.  Recommendations: 1. Admit to ICU under Neurology service 2. MRI brain 3. PT consult, OT consult, Speech consult 4. Cardiac telemetry 5. Frequent neuro checks 6. Stop Eliquis. Given ICH while on DOAC, anticoagulation should be stopped indefinitely. If hemorrhage resorbing with no complications at 2-4 weeks, can consider starting ASA for stroke prevention in the setting of his atrial fibrillation.  7. BP management with clevidipine drip  SBP goal < 140 8. No antiplatelet medications or anticoagulants 9. Andexxa is indicated for reversal of anticoagulation. Discussed with Pharmacy who will administer.  10. RT consult for CPAP at night  40 minutes spent in the emergent neurological evaluation and management of this critically ill patient with acute ICH.   Electronically signed: Dr. Caryl Pina 04/30/2020, 4:34 PM

## 2020-05-01 ENCOUNTER — Inpatient Hospital Stay (HOSPITAL_COMMUNITY): Payer: Medicare Other

## 2020-05-01 ENCOUNTER — Encounter (HOSPITAL_COMMUNITY): Payer: Self-pay | Admitting: Neurology

## 2020-05-01 ENCOUNTER — Other Ambulatory Visit: Payer: Self-pay

## 2020-05-01 DIAGNOSIS — I671 Cerebral aneurysm, nonruptured: Secondary | ICD-10-CM

## 2020-05-01 DIAGNOSIS — I6389 Other cerebral infarction: Secondary | ICD-10-CM | POA: Diagnosis not present

## 2020-05-01 DIAGNOSIS — Z7901 Long term (current) use of anticoagulants: Secondary | ICD-10-CM

## 2020-05-01 DIAGNOSIS — I10 Essential (primary) hypertension: Secondary | ICD-10-CM | POA: Diagnosis not present

## 2020-05-01 DIAGNOSIS — I610 Nontraumatic intracerebral hemorrhage in hemisphere, subcortical: Secondary | ICD-10-CM

## 2020-05-01 DIAGNOSIS — I482 Chronic atrial fibrillation, unspecified: Secondary | ICD-10-CM | POA: Diagnosis not present

## 2020-05-01 LAB — LIPID PANEL
Cholesterol: 126 mg/dL (ref 0–200)
HDL: 43 mg/dL (ref >40)
LDL Cholesterol: 31 mg/dL (ref 0–99)
Total CHOL/HDL Ratio: 2.9 RATIO
Triglycerides: 259 mg/dL — ABNORMAL HIGH (ref <150)
VLDL: 52 mg/dL — ABNORMAL HIGH (ref 0–40)

## 2020-05-01 LAB — CBC
HCT: 48.3 % (ref 39.0–52.0)
Hemoglobin: 16.9 g/dL (ref 13.0–17.0)
MCH: 29.7 pg (ref 26.0–34.0)
MCHC: 35 g/dL (ref 30.0–36.0)
MCV: 84.9 fL (ref 80.0–100.0)
Platelets: 197 10*3/uL (ref 150–400)
RBC: 5.69 MIL/uL (ref 4.22–5.81)
RDW: 13.2 % (ref 11.5–15.5)
WBC: 9 10*3/uL (ref 4.0–10.5)
nRBC: 0 % (ref 0.0–0.2)

## 2020-05-01 LAB — BASIC METABOLIC PANEL
Anion gap: 13 (ref 5–15)
BUN: 11 mg/dL (ref 8–23)
CO2: 25 mmol/L (ref 22–32)
Calcium: 9.9 mg/dL (ref 8.9–10.3)
Chloride: 93 mmol/L — ABNORMAL LOW (ref 98–111)
Creatinine, Ser: 0.98 mg/dL (ref 0.61–1.24)
GFR, Estimated: >60 mL/min (ref >60)
Glucose, Bld: 103 mg/dL — ABNORMAL HIGH (ref 70–99)
Potassium: 5.6 mmol/L — ABNORMAL HIGH (ref 3.5–5.1)
Sodium: 131 mmol/L — ABNORMAL LOW (ref 135–145)

## 2020-05-01 LAB — ECHOCARDIOGRAM COMPLETE
Height: 70.5 in
S' Lateral: 3.8 cm
Weight: 3417.6 oz

## 2020-05-01 LAB — HEMOGLOBIN A1C
Hgb A1c MFr Bld: 6 % — ABNORMAL HIGH (ref 4.8–5.6)
Mean Plasma Glucose: 125.5 mg/dL

## 2020-05-01 LAB — MRSA PCR SCREENING: MRSA by PCR: NEGATIVE

## 2020-05-01 MED ORDER — CHLORHEXIDINE GLUCONATE CLOTH 2 % EX PADS
6.0000 | MEDICATED_PAD | Freq: Every day | CUTANEOUS | Status: DC
  Administered 2020-05-01 – 2020-05-03 (×2): 6 via TOPICAL

## 2020-05-01 MED ORDER — LABETALOL HCL 5 MG/ML IV SOLN: 5.0000 mg | INTRAVENOUS | Status: DC | PRN

## 2020-05-01 MED ORDER — PANTOPRAZOLE SODIUM 40 MG PO TBEC
40.0000 mg | DELAYED_RELEASE_TABLET | Freq: Every day | ORAL | Status: DC
  Administered 2020-05-01 – 2020-05-03 (×3): 40 mg via ORAL
  Filled 2020-05-01 (×3): qty 1

## 2020-05-01 NOTE — Progress Notes (Signed)
Inpatient Rehabilitation Admissions Coordinator  I will follow up with patient and family to assist with planning CIR admit when medically ready.  Ottie Glazier, RN, MSN Rehab Admissions Coordinator [phone] 05/01/2020 4:50 PM

## 2020-05-01 NOTE — ED Provider Notes (Incomplete)
[facility] EMERGENCY DEPARTMENT Provider Note   CSN: [id number] Arrival date & time: 04/30/20  1544     History No chief complaint on file.   Steven Meadows is a 86 y.o. male.  HPI Patient is an 86 year old male with a history of multiple prior CVA, HTN, HLD, atrial fibrillation (appears to be on Eliquis) who presents to ED for stroke-like symptoms.  Per EMS, patient was noted to be confused when he woke up this morning -last known normal yesterday at 9:30 PM when he went to bed.  Patient presented to PCP earlier today and provider was concerned he was having a stroke. Found to have expressive aphasia and apraxia with EMS, with difficulty completing a signature and some continued confusion.  Sent to ED for further evaluation. On initial exam, patient denies any pain or complaints.  However, he is aphasic (unable to identify a pen).  Code stroke called on arrival.    Past Medical History:  Diagnosis Date  . Abdominal aortic atherosclerosis (HCC)   . Actinic keratoses   . 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 Atrial fibrillation (HCC)   . Cough   . History of mononucleosis   . HTN (hypertension)   . Hypercholesteremia   . Lacunar stroke (HCC)   . Low back pain   . Olecranon bursitis   . Rotator cuff tear   . Stroke [facility])     Patient Active Problem List   Diagnosis Date Noted  . Renal insufficiency 08/18/2019  . Acute CVA (cerebrovascular accident) (HCC) 09/03/2018  . Cerebrovascular accident (CVA) due to embolism of right posterior cerebral artery (HCC) 07/25/2015  . Chronic anticoagulation 01/23/2015  . HLD (hyperlipidemia) 01/23/2015  . Essential hypertension 01/23/2015  . Brain aneurysm 01/23/2015  . History of CVA (cerebrovascular accident) 12/21/2014  . Chronic atrial fibrillation (HCC) 12/21/2014  . 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  . Stroke with cerebral ischemia (HCC)   . TIA (transient ischemic attack) 12/20/2014  . Atrial  fibrillation (HCC) 12/20/2014  . Hyperlipidemia 06/17/2011  . Stroke (HCC) 06/17/2011  . Cough 06/17/2011    Past Surgical History:  Procedure Laterality Date  . CATARACT EXTRACTION--right eye  2013  . TONSILLECTOMY AND ADENOIDECTOMY         Family History  Problem Relation Age of Onset  . Heart attack Paternal Grandfather   . Heart attack Maternal Grandfather   . Cancer Paternal Grandmother   . Healthy Mother   . Transient ischemic attack Mother   . Healthy Father   . Healthy Sister     Social History   Tobacco Use  . Smoking status: Never Smoker  . Smokeless tobacco: Never Used  Substance Use Topics  . Alcohol use: Yes    Alcohol/week: 0.0 standard drinks    Comment: 2 drinks per night  . Drug use: No    Home Medications Prior to Admission medications   Medication Sig Start Date End Date Taking? Authorizing Provider  apixaban (ELIQUIS) 5 MG TABS tablet Take 1 tablet (5 mg total) by mouth 2 (two) times daily. 12/23/14   Kathlen Mody, MD  chlorhexidine (PERIDEX) 0.12 % solution 15 mLs by Mouth Rinse route 2 (two) times daily. Uses nightly 08/11/18   [provider]  diltiazem (CARDIZEM CD) 240 MG 24 hr capsule Take 1 capsule (240 mg total) by mouth daily. 12/23/14   Kathlen Mody, MD  ezetimibe (ZETIA) 10 MG tablet Take 10 mg by mouth daily.  [provider]  hydrALAZINE (APRESOLINE) 25 MG tablet Take 25 mg by mouth 2 (two) times daily. 06/28/18   [provider]  hydrocortisone 2.5 % cream  12/21/18   [provider]  loratadine (CLARITIN) 10 MG tablet Take 10 mg by mouth daily.    [provider]  omeprazole (PRILOSEC) 40 MG capsule Take 40 mg by mouth daily. 07/20/15   [provider]  rosuvastatin (CRESTOR) 10 MG tablet Take 1 tablet (10 mg total) by mouth daily at 6 PM. 12/23/14   Hosie Poisson, MD  triamterene-hydrochlorothiazide (MAXZIDE-25) 37.5-25 MG tablet Take 1 tablet by mouth daily. 07/12/18   [provider]    Allergies    Nabumetone and Flonase [fluticasone propionate]  Review of Systems   Review of Systems  Unable to perform ROS: Mental status change    Physical Exam Updated Vital Signs There were no vitals taken for this visit.  Physical Exam Vitals and nursing note reviewed.  Constitutional:      General: He is not in acute distress.    Appearance: Normal appearance. He is well-developed and well-nourished. He is not ill-appearing or toxic-appearing.  HENT:     Head: Normocephalic and atraumatic.     Nose: Nose normal.     Mouth/Throat:     Mouth: Mucous membranes are moist.     Pharynx: Oropharynx is clear.  Eyes:     General: No scleral icterus.    Extraocular Movements: Extraocular movements intact.     Pupils: Pupils are equal, round, and reactive to light.  Cardiovascular:     Rate and Rhythm: Normal rate and regular rhythm.     Pulses: Normal pulses.     Heart sounds: No murmur heard. No friction rub. No gallop.   Pulmonary:     Effort: Pulmonary effort is normal. No respiratory distress.     Breath sounds: Normal breath sounds. No wheezing, rhonchi or rales.  Abdominal:     General: There is no distension.     Palpations: Abdomen is soft.     Tenderness: There is no abdominal tenderness. There is no guarding or rebound.  Musculoskeletal:     Cervical back: Neck supple.     Right lower leg: Edema present.     Left lower leg: Edema present.     Comments: Bilateral lower leg edema, R> L  Skin:    General: Skin is warm and dry.  Neurological:     Mental Status: He is alert.     Comments: ***  Psychiatric:        Mood and Affect: Mood and affect normal.     ED Results / Procedures / Treatments   Labs (all labs ordered are listed, but only abnormal results are displayed) Labs Reviewed  DIFFERENTIAL - Abnormal; Notable for the following components:      Result Value   Monocytes Absolute 1.2 (*)    All other components within normal  limits  I-STAT CHEM 8, ED - Abnormal; Notable for the following components:   Sodium 133 (*)    Chloride 96 (*)    Glucose, Bld 106 (*)    Calcium, Ion 1.14 (*)    All other components within normal limits  SARS CORONAVIRUS 2 (TAT 6-24 HRS)  CBC  ETHANOL  PROTIME-INR  APTT  COMPREHENSIVE METABOLIC PANEL  RAPID URINE DRUG SCREEN, HOSP PERFORMED  URINALYSIS, ROUTINE W REFLEX MICROSCOPIC    EKG None  Radiology CT HEAD CODE STROKE WO CONTRAST  Result Date: 04/30/2020 CLINICAL DATA:  Code stroke.  Neuro deficit, acute. EXAM: CT HEAD WITHOUT CONTRAST TECHNIQUE: Contiguous axial images were obtained from the base of the skull through the vertex without intravenous contrast. COMPARISON:  Head CT May 1st, 2020. FINDINGS: Brain: 6 mm hyperdense focus in the left thalamus. Gray-white differentiation is maintained. No hydrocephalus, extra-axial collection or mass lesion. Vascular: No hyperdense vessel. Calcified plaques in the bilateral carotid siphons and intracranial vertebral arteries. Skull: Normal. Negative for fracture or focal lesion. Sinuses/Orbits: No acute finding. ASPECTS [facility] Stroke Program Early CT Score) - Ganglionic level infarction (caudate, lentiform nuclei, internal capsule, insula, M1-M3 cortex): 7 - Supraganglionic infarction (M4-M6 cortex): 3 Total score (0-10 with 10 being normal): 10 IMPRESSION: 1. A 6 mm hyperdense focus in the left thalamus, which may reflect a small hemorrhage. 2. ASPECTS is 10. Findings communicated to Dr. Cheral Marker via [facility] paging system at 16:12 on 04/30/2020. Electronically Signed   By: Pedro Earls M.D.   On: 04/30/2020 16:14    Procedures Procedures (including critical care time)  Medications Ordered in ED Medications  iohexol (OMNIPAQUE) 350 MG/ML injection 50 mL (has no administration in time range)    ED Course  I have reviewed the triage vital signs and the nursing notes.  Pertinent labs & imaging results that were  available during my care of the patient were reviewed by me and considered in my medical decision making (see chart for details).    MDM Rules/Calculators/A&P                          *** Final Clinical Impression(s) / ED Diagnoses Final diagnoses:  None    Rx / DC Orders ED Discharge Orders    None

## 2020-05-01 NOTE — Progress Notes (Signed)
  Speech Language Pathology  Patient Details Name: DEANGLO HISSONG MRN: [id number] DOB: 1931-07-14 Today's Date: 05/01/2020 Time:  -     Pt passed [facility] swallow screen. Will plan on completing SLE tomorrow as able.               GO                Royce Macadamia 05/01/2020, 2:47 PM  Breck Coons Lonell Face.Ed Nurse, children's [phone] Office [phone]

## 2020-05-01 NOTE — Progress Notes (Signed)
STROKE TEAM PROGRESS NOTE   INTERVAL HISTORY RN at the bedside. Pt lying in bed, denies HA, able to have short conversation initially but then became word salad, and gibberish. Able to repeat part of the sentence, and perseverated on naming. CT and MRI repeat showed stable small left thalamic ICH. On low dose cleviprex. Will relax BP goal.   Vitals:   05/01/20 0630 05/01/20 0700 05/01/20 0800 05/01/20 0900  BP: 105/66 (!) 116/104 119/82 135/86  Pulse: 71 (!) 37 77 92  Resp: 15 14 17 11   Temp:   98 F (36.7 C)   TempSrc:   Oral   SpO2: 97% 92% 97% 96%  Weight:      Height:       CBC:  Recent Labs  Lab 04/30/20 1602 04/30/20 1613 05/01/20 0707  WBC 7.3  --  9.0  NEUTROABS 4.4  --   --   HGB 15.7 16.3 16.9  HCT 47.4 48.0 48.3  MCV 87.3  --  84.9  PLT 182  --  [id number]   Basic Metabolic Panel:  Recent Labs  Lab 04/30/20 1602 04/30/20 1613 05/01/20 0707  NA 132* 133* 131*  K 3.8 3.7 5.6*  CL 96* 96* 93*  CO2 25  --  25  GLUCOSE 108* 106* 103*  BUN 14 17 11   CREATININE 1.11 1.00 0.98  CALCIUM 9.8  --  9.9   Lipid Panel:  Recent Labs  Lab 05/01/20 0707  CHOL 126  TRIG 259*  HDL 43  CHOLHDL 2.9  VLDL 52*  LDLCALC 31   HgbA1c:  Recent Labs  Lab 05/01/20 0707  HGBA1C 6.0*   Urine Drug Screen:  Recent Labs  Lab 04/30/20 1602  LABOPIA NONE DETECTED  COCAINSCRNUR NONE DETECTED  LABBENZ NONE DETECTED  AMPHETMU NONE DETECTED  THCU NONE DETECTED  LABBARB NONE DETECTED    Alcohol Level  Recent Labs  Lab 04/30/20 1602  ETH <10    IMAGING past 24 hours CT Angio Head W or Wo Contrast  Result Date: 04/30/2020 CLINICAL DATA:  Acute neuro deficit.  Aphasia. EXAM: CT ANGIOGRAPHY HEAD AND NECK TECHNIQUE: Multidetector CT imaging of the head and neck was performed using the standard protocol during bolus administration of intravenous contrast. Multiplanar CT image reconstructions and MIPs were obtained to evaluate the vascular anatomy. Carotid stenosis  measurements (when applicable) are obtained utilizing NASCET criteria, using the distal internal carotid diameter as the denominator. CONTRAST:  70mL OMNIPAQUE IOHEXOL 350 MG/ML SOLN COMPARISON:  CT head 04/30/2020.  CT angio head and neck 09/03/2018 FINDINGS: CTA NECK FINDINGS Aortic arch: Mild atherosclerotic calcification aortic arch. Proximal great vessels tortuous but widely patent. Right carotid system: Atherosclerotic calcification right carotid bifurcation without significant stenosis. Left carotid system: Atherosclerotic calcification left carotid bifurcation without stenosis Vertebral arteries: Right vertebral artery patent to the basilar. Hypoplastic left vertebral artery ends in PICA Skeleton: Cervical spondylosis.  No acute skeletal abnormality. Other neck: Negative for mass or adenopathy in the neck. Upper chest: Lung apices clear bilaterally. Review of the MIP images confirms the above findings CTA HEAD FINDINGS Anterior circulation: Atherosclerotic calcification in the cavernous carotid bilaterally without significant stenosis. Anterior and middle cerebral arteries patent bilaterally without significant stenosis or large vessel occlusion. Posterior circulation: Left vertebral artery ends in PICA. Right vertebral artery supplies the basilar. 6 mm aneurysm with associated calcification in the wall at the vertebrobasilar junction. Mild stenosis distal right vertebral artery proximal to the aneurysm. Basilar is diminutive due to fetal origin  of the posterior circulation bilaterally. No significant stenosis or large vessel occlusion in the posterior circulation Venous sinuses: Normal venous enhancement Anatomic variants: None Review of the MIP images confirms the above findings IMPRESSION: 1. Negative for intracranial large vessel occlusion 2. 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 3. Mild atherosclerotic calcification of the carotid bifurcation without  significant carotid stenosis. Atherosclerotic calcification in the cavernous carotid bilaterally. Electronically Signed   By: Franchot Gallo M.D.   On: 04/30/2020 16:38   CT Angio Neck W and/or Wo Contrast  Result Date: 04/30/2020 CLINICAL DATA:  Acute neuro deficit.  Aphasia. EXAM: CT ANGIOGRAPHY HEAD AND NECK TECHNIQUE: Multidetector CT imaging of the head and neck was performed using the standard protocol during bolus administration of intravenous contrast. Multiplanar CT image reconstructions and MIPs were obtained to evaluate the vascular anatomy. Carotid stenosis measurements (when applicable) are obtained utilizing NASCET criteria, using the distal internal carotid diameter as the denominator. CONTRAST:  37mL OMNIPAQUE IOHEXOL 350 MG/ML SOLN COMPARISON:  CT head 04/30/2020.  CT angio head and neck 09/03/2018 FINDINGS: CTA NECK FINDINGS Aortic arch: Mild atherosclerotic calcification aortic arch. Proximal great vessels tortuous but widely patent. Right carotid system: Atherosclerotic calcification right carotid bifurcation without significant stenosis. Left carotid system: Atherosclerotic calcification left carotid bifurcation without stenosis Vertebral arteries: Right vertebral artery patent to the basilar. Hypoplastic left vertebral artery ends in PICA Skeleton: Cervical spondylosis.  No acute skeletal abnormality. Other neck: Negative for mass or adenopathy in the neck. Upper chest: Lung apices clear bilaterally. Review of the MIP images confirms the above findings CTA HEAD FINDINGS Anterior circulation: Atherosclerotic calcification in the cavernous carotid bilaterally without significant stenosis. Anterior and middle cerebral arteries patent bilaterally without significant stenosis or large vessel occlusion. Posterior circulation: Left vertebral artery ends in PICA. Right vertebral artery supplies the basilar. 6 mm aneurysm with associated calcification in the wall at the vertebrobasilar junction.  Mild stenosis distal right vertebral artery proximal to the aneurysm. Basilar is diminutive due to fetal origin of the posterior circulation bilaterally. No significant stenosis or large vessel occlusion in the posterior circulation Venous sinuses: Normal venous enhancement Anatomic variants: None Review of the MIP images confirms the above findings IMPRESSION: 1. Negative for intracranial large vessel occlusion 2. 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 3. Mild atherosclerotic calcification of the carotid bifurcation without significant carotid stenosis. Atherosclerotic calcification in the cavernous carotid bilaterally. Electronically Signed   By: Franchot Gallo M.D.   On: 04/30/2020 16:38   MR BRAIN WO CONTRAST  Result Date: 05/01/2020 CLINICAL DATA:  86 year old male code stroke presentation yesterday. History of 6 x 8 mm aneurysm at the vertebrobasilar junction. EXAM: MRI HEAD WITHOUT CONTRAST TECHNIQUE: Multiplanar, multiecho pulse sequences of the brain and surrounding structures were obtained without intravenous contrast. COMPARISON:  Head CT 04/30/2020 and earlier. Brain MRI 09/03/2018 and earlier. FINDINGS: Brain: No restricted diffusion or evidence of acute infarction. But there is increased susceptibility in the left thalamus since May corresponding to multiple microhemorrhages which are new or increased from that time. The most recent of these appears to be that seen on series 11, image 15 along the dorsal superior thalamus. But there is no regional edema or associated mass effect. Multiple chronic microhemorrhages also in the right thalamus, brainstem and occasionally elsewhere in the cerebral hemispheres appear stable since May. Advanced chronic T2 heterogeneity throughout the bilateral deep gray nuclei. Increased T2 heterogeneity related to  lacunar infarcts in the pons since May (series 10, image 8). Additional confluent bilateral cerebral white matter  T2 and FLAIR hyperintensity with multiple areas most resembling chronic white matter lacunar infarcts. No definite cortical encephalomalacia. No midline shift, mass effect, evidence of mass lesion, ventriculomegaly, extra-axial collection or acute intracranial hemorrhage. Cervicomedullary junction and pituitary are within normal limits. Vascular: Major intracranial vascular flow voids are stable since May, with vertebrobasilar junction aneurysm redemonstrated on series 10, image 4. Skull and upper cervical spine: Stable and negative; C2-C3 ankylosis. Sinuses/Orbits: Stable and negative. Other: Mastoids remain clear. Stable visible internal auditory structures. IMPRESSION: 1. Progressed small vessel disease in the left thalamus and pons since May, including a late subacute appearing left thalamic microhemorrhage corresponding to the area of density on CT yesterday. No associated edema or mass effect. 2. No superimposed acute infarct or acute intracranial hemorrhage identified. 3. Underlying severe chronic small vessel disease, chronic vertebrobasilar junction Aneurysm. Electronically Signed   By: Odessa Fleming M.D.   On: 05/01/2020 05:05   Chest Port 1 View  Result Date: 04/30/2020 CLINICAL DATA:  Confusion.  Intracranial hemorrhage. EXAM: PORTABLE CHEST 1 VIEW COMPARISON:  Radiograph 03/18/2011 FINDINGS: Upper normal heart size.The cardiomediastinal contours are normal. Mild aortic atherosclerosis. Subsegmental atelectasis in the left mid lung. Pulmonary vasculature is normal. No consolidation, pleural effusion, or pneumothorax. No acute osseous abnormalities are seen. IMPRESSION: Subsegmental atelectasis in the left mid lung. Electronically Signed   By: Narda Rutherford M.D.   On: 04/30/2020 18:22   CT HEAD CODE STROKE WO CONTRAST  Result Date: 04/30/2020 CLINICAL DATA:  Code stroke.  Neuro deficit, acute. EXAM: CT HEAD WITHOUT CONTRAST TECHNIQUE: Contiguous axial images were obtained from the base of the skull  through the vertex without intravenous contrast. COMPARISON:  Head CT May 1st, 2020. FINDINGS: Brain: 6 mm hyperdense focus in the left thalamus. Gray-white differentiation is maintained. No hydrocephalus, extra-axial collection or mass lesion. Vascular: No hyperdense vessel. Calcified plaques in the bilateral carotid siphons and intracranial vertebral arteries. Skull: Normal. Negative for fracture or focal lesion. Sinuses/Orbits: No acute finding. ASPECTS [facility] Stroke Program Early CT Score) - Ganglionic level infarction (caudate, lentiform nuclei, internal capsule, insula, M1-M3 cortex): 7 - Supraganglionic infarction (M4-M6 cortex): 3 Total score (0-10 with 10 being normal): 10 IMPRESSION: 1. A 6 mm hyperdense focus in the left thalamus, which may reflect a small hemorrhage. 2. ASPECTS is 10. Findings communicated to Dr. Otelia Limes via [facility] paging system at 16:12 on 04/30/2020. Electronically Signed   By: Baldemar Lenis M.D.   On: 04/30/2020 16:14    PHYSICAL EXAM  Temp:  [97.9 F (36.6 C)-98.4 F (36.9 C)] 98 F (36.7 C) (12/28 0800) Pulse Rate:  [37-103] 89 (12/28 1100) Resp:  [0-24] 16 (12/28 1100) BP: (100-153)/(66-111) 121/105 (12/28 1000) SpO2:  [92 %-100 %] 99 % (12/28 1100) Weight:  [96.9 kg] 96.9 kg (12/27 1633)  General - Well nourished, well developed, in no apparent distress.  Ophthalmologic - fundi not visualized due to noncooperation.  Cardiovascular - irregularly irregular heart rate and rhythm.  Neuro - awake alert, eyes open, orientated to self, but not able to answer other orientation questions. Able to have limited initial conversation, became word salad as Steven Meadows continues to talk. Able to follow midline commands, but difficulty with peripheral commands but able to pantomime. Named first object and then perseverated on that. Only able to repeat part of the sentences. PERRL. Blinking to visual threat bilaterally, able to track bilaterally without  gaze palsy.  Mild right nasolabial fold flattening. Tongue protrusion midline. Moving all extremities symmetrically and equally. FTN not cooperative. Subjectively symmetric sensation throughout. Gait not tested.     ASSESSMENT/PLAN Steven Meadows is a 86 y.o. male with history of AF on Eliquis, 3 prior ischemic strokes, abdominal aortic atherosclerosis, vertebral artery aneurysm, HTN, OSA, hypercholesterolemia and rotator cuff tear presenting with aphasia.   Stroke:   Small L thalamic hemorrhage in setting of Eliquis coagulopathy s/p reversal w/ Andexxa   Code Stroke CT head L thalamus hyperdensity. ASPECTS 10.     CTA head & neck no LVO. 47mm R VBJ aneurysm stable. Mild atherosclerosis ICA bifurcation w/ B cavernous calcification  MRI  Progressed small vessel disease L thalamus and pons w/ late subacute L thalamic microhemorrhage. No acute infarct or hemorrhage. Severe small vessel disease. Chronic VBJ aneurysm.   Repeat CT head am pending   2D Echo pending  LDL 31  HgbA1c 6.0  VTE prophylaxis - SCDs   Eliquis (apixaban) daily prior to admission, reversed w/ andexxa. Not on antithrombotics now due to [place]. May consider to resume eliquis once bleeding absorbed.   Therapy recommendations:  pending  Disposition:  pending   Transfer to the floor once off cleviprex  Atrial Fibrillation  Home anticoagulation:  Eliquis (apixaban) daily  . Off AC d/t hemorrhage . Resume home cardizem   May consider to resume eliquis once bleeding absorbed.   Hx stroke/TIA  12/2014 - right splenium infarct on MRI. MRA stable right VA, VBJ fusifum aneurysm. LDL 62 and A1C 6.5. EF 50-55% and CUS neg. Found to have afib RVR, Steven Meadows was discharged with eliquis and cardizem  09/2018 -  right CR small infarct, likely small vessel disease given location. However, Steven Meadows does have afib so embolic etiology can not be completely ruled out. CTA head and neck stable VBJ aneurysm. LDL 54 and A1C 6.0. D/c on asa and eliquis as well  as crestor and zetia.  Followed up with Dr. Leonie Man at [facility], ASA discontinued, but continued on eliquis  03/2019 EMG showed severe sensorimotor anxonal polyneuropathy  Hypertension  Home meds:  Diltiazem 240 CD, hydralazine 25 bid, triamterene-HCTZ 37.5-25 daily . on Cleviprex gtt - wean off as able  Home meds resumed . SBP goal <160  . Long-term BP goal normotensive  Hyperlipidemia  Home meds:  crestor 10 + Zetia 10  LDL 31, goal < 70  Resumed home zetia  Recommend to discontinue statin given ICH in the setting of low LDL  Other Stroke Risk Factors  Advanced Age >/= 56   ETOH use, alcohol level <10, advised to drink no more than 2 drink(s) a day  Obesity, Body mass index is 30.22 kg/m., BMI >/= 30 associated with increased stroke risk, recommend weight loss, diet and exercise as appropriate   AAA  Other Active Problems  Chronic VBJ aneurysm, stable, followed by [facility]   CXR atx L mid lung  Hyperkalemia K 3.8->3.7->5.6 (hemolysis)   Hyponatremia Na 132->133->131  Hospital day # 1  This patient is critically ill due to left thalamic ICH, chronic afib, hypertensive emergency and at significant risk of neurological worsening, death form hematoma expansion, cerebral edema, ischemic stroke, heart failure, seizure. This patient's care requires constant monitoring of vital signs, hemodynamics, respiratory and cardiac monitoring, review of multiple databases, neurological assessment, discussion with family, other specialists and medical decision making of high complexity. I spent 35 minutes of neurocritical care time in the care of this patient.  I had long discussion with wife over the phone, updated pt current condition, treatment plan and potential prognosis, and answered all the questions. She expressed understanding and appreciation.   Rosalin Hawking, MD PhD Stroke Neurology 05/01/2020 11:23 AM    To contact Stroke Continuity provider, please refer to  [email or web address] After hours, contact General Neurology

## 2020-05-01 NOTE — Evaluation (Signed)
Physical Therapy Evaluation Patient Details Name: Steven Meadows MRN: [id number] DOB: 03/08/1932 Today's Date: 05/01/2020   History of Present Illness  Steven Meadows is an 86 y.o. male presenting wtih receptive and expressive aphasia and R visual neglect. Imaging revealed acute L thalamic hemorrhage. PMHx of atrial fibrillation (on Eliquis), 3 prior ischemic strokes, abdominal aortic atherosclerosis, vertebral artery aneurysm, HTN, OSA, hypercholesterolemia and rotator cuff tear.   Clinical Impression   Pt admitted with above. Pt presenting with word finding difficulty, delayed processing, impaired sequencing, difficulty identifying objects, impaired balance, and requiring increased assist for safe mobility. Pt with indep with use of SPC and RW PTA. Recommend CIR upon d/c to maximize functional recovery for safe transition home with spouse.    Follow Up Recommendations CIR    Equipment Recommendations  3in1 (PT) (has DME, would benefit from shower seat)    Recommendations for Other Services       Precautions / Restrictions Precautions Precautions: Fall Precaution Comments: word finding difficulty Restrictions Weight Bearing Restrictions: No      Mobility  Bed Mobility Overal bed mobility: Needs Assistance Bed Mobility: Supine to Sit     Supine to sit: Min assist     General bed mobility comments: max verbal and directional cues to complete transfer due to delayed processing, minA for trunk elevation, tactile cues to scoot forward to place feet on the floor    Transfers Overall transfer level: Needs assistance Equipment used: 2 person hand held assist Transfers: Sit to/from Stand Sit to Stand: Mod assist;+2 physical assistance;+2 safety/equipment         General transfer comment: modA to power up, wide base of support, increased time, signficant trunk flexion  Ambulation/Gait Ambulation/Gait assistance: Mod assist;+2 physical assistance;Min assist Gait Distance  (Feet): 12 Feet (from 1 side of the bed to the other) Assistive device: 2 person hand held assist Gait Pattern/deviations: Step-through pattern;Decreased stride length;Shuffle Gait velocity: slow Gait velocity interpretation: <1.8 ft/sec, indicate of risk for recurrent falls General Gait Details: pt with significant grip on PT and OTs hands, short shuffled steps, guarded  Stairs            Wheelchair Mobility    Modified Rankin (Stroke Patients Only) Modified Rankin (Stroke Patients Only) Pre-Morbid Rankin Score: Slight disability Modified Rankin: Moderately severe disability     Balance Overall balance assessment: Needs assistance Sitting-balance support: Feet supported;No upper extremity supported Sitting balance-Leahy Scale: Fair     Standing balance support: Bilateral upper extremity supported;During functional activity Standing balance-Leahy Scale: Poor Standing balance comment: dependent on physical assist                             Pertinent Vitals/Pain Pain Assessment: Faces Faces Pain Scale: Hurts little more Pain Location: headache Pain Descriptors / Indicators: Headache Pain Intervention(s): RN gave pain meds during session    Home Living Family/patient expects to be discharged to:: Private residence Living Arrangements: Spouse/significant other Available Help at Discharge: Family;Available 24 hours/day Type of Home: House Home Access: Stairs to enter Entrance Stairs-Rails: Right Entrance Stairs-Number of Steps: 2 Home Layout: Two level;Able to live on main level with bedroom/bathroom Home Equipment: Grab bars - toilet;Grab bars - tub/shower;Cane - single point;Walker - 4 wheels;Walker - 2 wheels      Prior Function Level of Independence: Independent         Comments: gardens, 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  Hand Dominance   Dominant Hand: Right    Extremity/Trunk Assessment   Upper Extremity Assessment Upper  Extremity Assessment: Defer to OT evaluation    Lower Extremity Assessment Lower Extremity Assessment: Generalized weakness    Cervical / Trunk Assessment Cervical / Trunk Assessment: Normal  Communication   Communication: Receptive difficulties;Expressive difficulties  Cognition Arousal/Alertness: Awake/alert Behavior During Therapy: WFL for tasks assessed/performed Overall Cognitive Status: Impaired/Different from baseline Area of Impairment: Orientation;Memory;Following commands;Problem solving                 Orientation Level: Disoriented to;Place;Time;Situation (stated [place] and hospital when given choices. unable to state date despite max verbal cues, stated February of 2058)   Memory: Decreased short-term memory Following Commands: Follows one step commands with increased time     Problem Solving: Slow processing;Difficulty sequencing;Requires verbal cues;Requires tactile cues General Comments: pt with increased processing time, word finding difficulty, 75% accuracy with object identification when given increased time      General Comments General comments (skin integrity, edema, etc.): VSS, 95% SpO2 on RA    Exercises     Assessment/Plan    PT Assessment Patient needs continued PT services  PT Problem List Decreased strength;Decreased range of motion;Decreased coordination;Decreased activity tolerance;Decreased balance;Decreased mobility;Decreased cognition       PT Treatment Interventions DME instruction;Gait training;Stair training;Functional mobility training;Therapeutic activities;Therapeutic exercise;Balance training    PT Goals (Current goals can be found in the Care Plan section)  Acute Rehab PT Goals Patient Stated Goal: didnt state PT Goal Formulation: With patient/family Time For Goal Achievement: 05/15/20 Potential to Achieve Goals: Good    Frequency Min 4X/week   Barriers to discharge        Co-evaluation PT/OT/SLP  Co-Evaluation/Treatment: Yes Reason for Co-Treatment: To address functional/ADL transfers PT goals addressed during session: Mobility/safety with mobility         AM-PAC PT "6 Clicks" Mobility  Outcome Measure Help needed turning from your back to your side while in a flat bed without using bedrails?: A Little Help needed moving from lying on your back to sitting on the side of a flat bed without using bedrails?: A Little Help needed moving to and from a bed to a chair (including a wheelchair)?: A Little Help needed standing up from a chair using your arms (e.g., wheelchair or bedside chair)?: A Lot Help needed to walk in hospital room?: A Lot Help needed climbing 3-5 steps with a railing? : A Lot 6 Click Score: 15    End of Session Equipment Utilized During Treatment: Gait belt Activity Tolerance: Patient tolerated treatment well Patient left: in chair;with call bell/phone within reach;with chair alarm set;with family/visitor present Nurse Communication: Mobility status PT Visit Diagnosis: Unsteadiness on feet (R26.81);Muscle weakness (generalized) (M62.81);Difficulty in walking, not elsewhere classified (R26.2)    Time: [id number] PT Time Calculation (min) (ACUTE ONLY): 35 min   Charges:   PT Evaluation $PT Eval Moderate Complexity: 1 Mod          Kittie Plater, PT, DPT Acute Rehabilitation Services Pager #: [phone] Office #: [phone]   Berline Lopes 05/01/2020, 10:11 AM

## 2020-05-01 NOTE — Progress Notes (Signed)
Rehab Admissions Coordinator Note:  Per PT recommendation, this patient was screened by Cheri Rous for appropriateness for an Inpatient Acute Rehab Consult.  At this time, we are recommending Inpatient Rehab consult. AC will place consult order per protocol.   Cheri Rous 05/01/2020, 12:39 PM  I can be reached at [phone].

## 2020-05-01 NOTE — Consult Note (Signed)
Physical Medicine and Rehabilitation Consult   Reason for Consult: ICH with functional deficits.  Referring Physician: Dr. Erlinda Hong   HPI: Steven Meadows is a 86 y.o.  RH- male with history of HTN, A fib- on Eliquis , severe neuropathy, prior CVA, VA aneurysm who was admitted on 04/30/20 with acute onset of confusion with garbled speech. CT head showed 6 mm hyperdense sign in left thalamus suspicious for hemorrhage. Eliquis reversed with Andexxa. CTA head/neck was negative for LVO with unchanged 6 mm R-VB aneurysm.  Dr. Erlinda Hong felt that small thalamic hemorrhage in setting of coagulopathy due to Eliquis and on low dose Cleverpex for BP management. Follow up MRI brain done revealing progression of small vessel disease in left thalamus and pons w/ late subacute left thalamic microhemorrhage and severe chronic small vessel disease. PT evaluation done revealing balance deficits with weakness affecting mobility. Physical Medicine & Rehabilitation was consulted to assess candidacy for CIR given impaired cognition and mobility. Patient and wife at bedside agreeable to CIR.   Review of Systems  Constitutional: Negative for chills and fever.  HENT: Negative for hearing loss and tinnitus.   Eyes: Negative for blurred vision and double vision.  Respiratory: Negative for cough and hemoptysis.   Cardiovascular: Negative for chest pain and palpitations.  Gastrointestinal: Negative for heartburn and nausea.  Genitourinary: Negative for dysuria and urgency.  Musculoskeletal: Negative for back pain and myalgias.  Neurological: Positive for sensory change (feet are numb due to neuropathy) and weakness. Negative for dizziness and headaches.  Psychiatric/Behavioral: Positive for memory loss.     Past Medical History:  Diagnosis Date  . Abdominal aortic atherosclerosis ([place])   . Actinic keratoses   . 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 Atrial fibrillation ([place])   . Cough   . History of mononucleosis   .  HTN (hypertension)   . Hypercholesteremia   . Lacunar stroke ([place])   . Low back pain   . Olecranon bursitis   . Rotator cuff tear   . Stroke [facility])     Past Surgical History:  Procedure Laterality Date  . CATARACT EXTRACTION--right eye  2013  . TONSILLECTOMY AND ADENOIDECTOMY      Family History  Problem Relation Age of Onset  . Heart attack Paternal Grandfather   . Heart attack Maternal Grandfather   . Cancer Paternal Grandmother   . Healthy Mother   . Transient ischemic attack Mother   . Healthy Father   . Healthy Sister     Social History:  Married. Retired but independent [employer] and works in yard daily for 3 hours. He uses walker out of home. He  reports that he has never smoked. He has never used smokeless tobacco. He reports current alcohol use. He reports that he does not use drugs.     Allergies  Allergen Reactions  . Nabumetone Other (See Comments)    Gi upset  . Ace Inhibitors Cough  . Flonase [Fluticasone Propionate] Other (See Comments)    Causes nosebleeds    Medications Prior to Admission  Medication Sig Dispense Refill  . chlorhexidine (PERIDEX) 0.12 % solution 15 mLs by Mouth Rinse route at bedtime.    Marland Kitchen diltiazem (CARDIZEM CD) 240 MG 24 hr capsule Take 1 capsule (240 mg total) by mouth daily. 30 capsule 0  . ezetimibe (ZETIA) 10 MG tablet Take 10 mg by mouth daily.    . hydrALAZINE (APRESOLINE) 25 MG tablet Take 25 mg by mouth  2 (two) times daily.    Marland Kitchen omeprazole (PRILOSEC) 40 MG capsule Take 40 mg by mouth daily before breakfast.    . PRESCRIPTION MEDICATION CPAP- At bedtime    . rosuvastatin (CRESTOR) 10 MG tablet Take 1 tablet (10 mg total) by mouth daily at 6 PM. 30 tablet 0  . triamterene-hydrochlorothiazide (MAXZIDE-25) 37.5-25 MG tablet Take 1 tablet by mouth daily.    . hydrocortisone 2.5 % cream Apply 1 application topically as directed.      Home: Home Living Family/patient expects to be discharged to:: Private residence Living  Arrangements: Spouse/significant other Available Help at Discharge: Family,Available 24 hours/day Type of Home: House Home Access: Stairs to enter [employer] of Steps: 2 Entrance Stairs-Rails: Right Home Layout: Two level,Able to live on main level with bedroom/bathroom Alternate Level Stairs-Number of Steps: flight (doesnt ever go upstairs) Bathroom Shower/Tub: Multimedia programmer: Handicapped height Home Equipment: Grab bars - toilet,Grab bars - tub/shower,Cane - single point,Walker - 4 wheels,Walker - 2 wheels  Functional History: Prior Function Level of Independence: Independent Comments: gardens, grows Teacher, 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 Functional Status:  Mobility: Bed Mobility Overal bed mobility: Needs Assistance Bed Mobility: Supine to Sit Supine to sit: Min assist General bed mobility comments: max verbal and directional cues to complete transfer due to delayed processing, minA for trunk elevation, tactile cues to scoot forward to place feet on the floor Transfers Overall transfer level: Needs assistance Equipment used: 2 person hand held assist Transfers: Sit to/from Stand Sit to Stand: Mod assist,+2 physical assistance,+2 safety/equipment General transfer comment: modA to power up, wide base of support, increased time, signficant trunk flexion Ambulation/Gait Ambulation/Gait assistance: Mod assist,+2 physical assistance,Min assist Gait Distance (Feet): 12 Feet (from 1 side of the bed to the other) Assistive device: 2 person hand held assist Gait Pattern/deviations: Step-through pattern,Decreased stride length,Shuffle General Gait Details: pt with significant grip on PT and OTs hands, short shuffled steps, guarded Gait velocity: slow Gait velocity interpretation: <1.8 ft/sec, indicate of risk for recurrent falls    ADL:    Cognition: Cognition Overall Cognitive Status: Impaired/Different from baseline Orientation Level: Oriented  X4 Cognition Arousal/Alertness: Awake/alert Behavior During Therapy: WFL for tasks assessed/performed Overall Cognitive Status: Impaired/Different from baseline Area of Impairment: Orientation,Memory,Following commands,Problem solving Orientation Level: Disoriented to,Place,Time,Situation (stated [facility] and hospital when given choices. unable to state date despite max verbal cues, stated February of 2058) Memory: Decreased short-term memory Following Commands: Follows one step commands with increased time Problem Solving: Slow processing,Difficulty sequencing,Requires verbal cues,Requires tactile cues General Comments: pt with increased processing time, word finding difficulty, 75% accuracy with object identification when given increased time   Blood pressure 124/74, pulse 86, temperature 97.8 F (36.6 C), temperature source Oral, resp. rate 18, height 5' 10.5" (1.791 m), weight 96.9 kg, SpO2 95 %. Physical Exam General: Alert, No apparent distress HEENT: Head is normocephalic, atraumatic, PERRLA, EOMI, sclera anicteric, oral mucosa pink and moist, dentition intact, ext ear canals clear,  Neck: Supple without JVD or lymphadenopathy Heart: Reg rate and rhythm. No murmurs rubs or gallops Chest: CTA bilaterally without wheezes, rales, or rhonchi; no distress Abdomen: Soft, non-tender, non-distended, bowel sounds positive. Extremities: No clubbing, cyanosis, or edema. Pulses are 2+ Skin: Clean and intact without signs of breakdown Neuro: Speech clear. Mild expressive deficits with delay in verbal output that improved as conversation progressed. Word finding deficits noted and he had difficulty recalling his children's ages.  He was able to follow simple motor  commands without difficulty. Sensory deficits bilateral feet.  Moving all extremities without difficulty Psych: Pt's affect is appropriate. Pt is cooperative   Results for orders placed or performed during the hospital encounter of  04/30/20 (from the past 24 hour(s))  Ethanol     Status: None   Collection Time: 04/30/20  4:02 PM  Result Value Ref Range   Alcohol, Ethyl (B) <10 <10 mg/dL  Protime-INR     Status: None   Collection Time: 04/30/20  4:02 PM  Result Value Ref Range   Prothrombin Time 14.5 11.4 - 15.2 seconds   INR 1.2 0.8 - 1.2  APTT     Status: Abnormal   Collection Time: 04/30/20  4:02 PM  Result Value Ref Range   aPTT 39 (H) 24 - 36 seconds  CBC     Status: None   Collection Time: 04/30/20  4:02 PM  Result Value Ref Range   WBC 7.3 4.0 - 10.5 K/uL   RBC 5.43 4.22 - 5.81 MIL/uL   Hemoglobin 15.7 13.0 - 17.0 g/dL   HCT 47.4 39.0 - 52.0 %   MCV 87.3 80.0 - 100.0 fL   MCH 28.9 26.0 - 34.0 pg   MCHC 33.1 30.0 - 36.0 g/dL   RDW 13.2 11.5 - 15.5 %   Platelets 182 150 - 400 K/uL   nRBC 0.0 0.0 - 0.2 %  Differential     Status: Abnormal   Collection Time: 04/30/20  4:02 PM  Result Value Ref Range   Neutrophils Relative % 60 %   Neutro Abs 4.4 1.7 - 7.7 K/uL   Lymphocytes Relative 21 %   Lymphs Abs 1.6 0.7 - 4.0 K/uL   Monocytes Relative 16 %   Monocytes Absolute 1.2 (H) 0.1 - 1.0 K/uL   Eosinophils Relative 1 %   Eosinophils Absolute 0.1 0.0 - 0.5 K/uL   Basophils Relative 1 %   Basophils Absolute 0.0 0.0 - 0.1 K/uL   Immature Granulocytes 1 %   Abs Immature Granulocytes 0.04 0.00 - 0.07 K/uL  Comprehensive metabolic panel     Status: Abnormal   Collection Time: 04/30/20  4:02 PM  Result Value Ref Range   Sodium 132 (L) 135 - 145 mmol/L   Potassium 3.8 3.5 - 5.1 mmol/L   Chloride 96 (L) 98 - 111 mmol/L   CO2 25 22 - 32 mmol/L   Glucose, Bld 108 (H) 70 - 99 mg/dL   BUN 14 8 - 23 mg/dL   Creatinine, Ser 1.11 0.61 - 1.24 mg/dL   Calcium 9.8 8.9 - 10.3 mg/dL   Total Protein 6.7 6.5 - 8.1 g/dL   Albumin 4.2 3.5 - 5.0 g/dL   AST 26 15 - 41 U/L   ALT 25 0 - 44 U/L   Alkaline Phosphatase 28 (L) 38 - 126 U/L   Total Bilirubin 1.2 0.3 - 1.2 mg/dL   GFR, Estimated >60 >60 mL/min   Anion gap  11 5 - 15  Urine rapid drug screen (hosp performed)     Status: None   Collection Time: 04/30/20  4:02 PM  Result Value Ref Range   Opiates NONE DETECTED NONE DETECTED   Cocaine NONE DETECTED NONE DETECTED   Benzodiazepines NONE DETECTED NONE DETECTED   Amphetamines NONE DETECTED NONE DETECTED   Tetrahydrocannabinol NONE DETECTED NONE DETECTED   Barbiturates NONE DETECTED NONE DETECTED  Urinalysis, Routine w reflex microscopic     Status: None   Collection Time: 04/30/20  4:02 PM  Result Value Ref Range   Color, Urine YELLOW YELLOW   APPearance CLEAR CLEAR   Specific Gravity, Urine 1.023 1.005 - 1.030   pH 6.0 5.0 - 8.0   Glucose, UA NEGATIVE NEGATIVE mg/dL   Hgb urine dipstick NEGATIVE NEGATIVE   Bilirubin Urine NEGATIVE NEGATIVE   Ketones, ur NEGATIVE NEGATIVE mg/dL   Protein, ur NEGATIVE NEGATIVE mg/dL   Nitrite NEGATIVE NEGATIVE   Leukocytes,Ua NEGATIVE NEGATIVE  SARS CORONAVIRUS 2 (TAT 6-24 HRS) Nasopharyngeal     Status: None   Collection Time: 04/30/20  4:03 PM   Specimen: Nasopharyngeal  Result Value Ref Range   SARS Coronavirus 2 NEGATIVE NEGATIVE  I-stat chem 8, ED     Status: Abnormal   Collection Time: 04/30/20  4:13 PM  Result Value Ref Range   Sodium 133 (L) 135 - 145 mmol/L   Potassium 3.7 3.5 - 5.1 mmol/L   Chloride 96 (L) 98 - 111 mmol/L   BUN 17 8 - 23 mg/dL   Creatinine, Ser 1.00 0.61 - 1.24 mg/dL   Glucose, Bld 106 (H) 70 - 99 mg/dL   Calcium, Ion 1.14 (L) 1.15 - 1.40 mmol/L   TCO2 24 22 - 32 mmol/L   Hemoglobin 16.3 13.0 - 17.0 g/dL   HCT 48.0 39.0 - 52.0 %  MRSA PCR Screening     Status: None   Collection Time: 04/30/20  8:11 PM   Specimen: Nasal Mucosa; Nasopharyngeal  Result Value Ref Range   MRSA by PCR NEGATIVE NEGATIVE  CBC     Status: None   Collection Time: 05/01/20  7:07 AM  Result Value Ref Range   WBC 9.0 4.0 - 10.5 K/uL   RBC 5.69 4.22 - 5.81 MIL/uL   Hemoglobin 16.9 13.0 - 17.0 g/dL   HCT 48.3 39.0 - 52.0 %   MCV 84.9 80.0 -  100.0 fL   MCH 29.7 26.0 - 34.0 pg   MCHC 35.0 30.0 - 36.0 g/dL   RDW 13.2 11.5 - 15.5 %   Platelets 197 150 - 400 K/uL   nRBC 0.0 0.0 - 0.2 %  Basic metabolic panel     Status: Abnormal   Collection Time: 05/01/20  7:07 AM  Result Value Ref Range   Sodium 131 (L) 135 - 145 mmol/L   Potassium 5.6 (H) 3.5 - 5.1 mmol/L   Chloride 93 (L) 98 - 111 mmol/L   CO2 25 22 - 32 mmol/L   Glucose, Bld 103 (H) 70 - 99 mg/dL   BUN 11 8 - 23 mg/dL   Creatinine, Ser 0.98 0.61 - 1.24 mg/dL   Calcium 9.9 8.9 - 10.3 mg/dL   GFR, Estimated >60 >60 mL/min   Anion gap 13 5 - 15  Hemoglobin A1c     Status: Abnormal   Collection Time: 05/01/20  7:07 AM  Result Value Ref Range   Hgb A1c MFr Bld 6.0 (H) 4.8 - 5.6 %   Mean Plasma Glucose 125.5 mg/dL  Lipid panel     Status: Abnormal   Collection Time: 05/01/20  7:07 AM  Result Value Ref Range   Cholesterol 126 0 - 200 mg/dL   Triglycerides 259 (H) <150 mg/dL   HDL 43 >40 mg/dL   Total CHOL/HDL Ratio 2.9 RATIO   VLDL 52 (H) 0 - 40 mg/dL   LDL Cholesterol 31 0 - 99 mg/dL   CT Angio Head W or Wo Contrast  Result Date: 04/30/2020 CLINICAL DATA:  Acute neuro deficit.  Aphasia.  EXAM: CT ANGIOGRAPHY HEAD AND NECK TECHNIQUE: Multidetector CT imaging of the head and neck was performed using the standard protocol during bolus administration of intravenous contrast. Multiplanar CT image reconstructions and MIPs were obtained to evaluate the vascular anatomy. Carotid stenosis measurements (when applicable) are obtained utilizing NASCET criteria, using the distal internal carotid diameter as the denominator. CONTRAST:  50mL OMNIPAQUE IOHEXOL 350 MG/ML SOLN COMPARISON:  CT head 04/30/2020.  CT angio head and neck 09/03/2018 FINDINGS: CTA NECK FINDINGS Aortic arch: Mild atherosclerotic calcification aortic arch. Proximal great vessels tortuous but widely patent. Right carotid system: Atherosclerotic calcification right carotid bifurcation without significant stenosis.  Left carotid system: Atherosclerotic calcification left carotid bifurcation without stenosis Vertebral arteries: Right vertebral artery patent to the basilar. Hypoplastic left vertebral artery ends in PICA Skeleton: Cervical spondylosis.  No acute skeletal abnormality. Other neck: Negative for mass or adenopathy in the neck. Upper chest: Lung apices clear bilaterally. Review of the MIP images confirms the above findings CTA HEAD FINDINGS Anterior circulation: Atherosclerotic calcification in the cavernous carotid bilaterally without significant stenosis. Anterior and middle cerebral arteries patent bilaterally without significant stenosis or large vessel occlusion. Posterior circulation: Left vertebral artery ends in PICA. Right vertebral artery supplies the basilar. 6 mm aneurysm with associated calcification in the wall at the vertebrobasilar junction. Mild stenosis distal right vertebral artery proximal to the aneurysm. Basilar is diminutive due to fetal origin of the posterior circulation bilaterally. No significant stenosis or large vessel occlusion in the posterior circulation Venous sinuses: Normal venous enhancement Anatomic variants: None Review of the MIP images confirms the above findings IMPRESSION: 1. Negative for intracranial large vessel occlusion 2. 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 3. Mild atherosclerotic calcification of the carotid bifurcation without significant carotid stenosis. Atherosclerotic calcification in the cavernous carotid bilaterally. Electronically Signed   By: Marlan Palauharles  Clark M.D.   On: 04/30/2020 16:38   CT Angio Neck W and/or Wo Contrast  Result Date: 04/30/2020 CLINICAL DATA:  Acute neuro deficit.  Aphasia. EXAM: CT ANGIOGRAPHY HEAD AND NECK TECHNIQUE: Multidetector CT imaging of the head and neck was performed using the standard protocol during bolus administration of intravenous contrast. Multiplanar CT image reconstructions  and MIPs were obtained to evaluate the vascular anatomy. Carotid stenosis measurements (when applicable) are obtained utilizing NASCET criteria, using the distal internal carotid diameter as the denominator. CONTRAST:  50mL OMNIPAQUE IOHEXOL 350 MG/ML SOLN COMPARISON:  CT head 04/30/2020.  CT angio head and neck 09/03/2018 FINDINGS: CTA NECK FINDINGS Aortic arch: Mild atherosclerotic calcification aortic arch. Proximal great vessels tortuous but widely patent. Right carotid system: Atherosclerotic calcification right carotid bifurcation without significant stenosis. Left carotid system: Atherosclerotic calcification left carotid bifurcation without stenosis Vertebral arteries: Right vertebral artery patent to the basilar. Hypoplastic left vertebral artery ends in PICA Skeleton: Cervical spondylosis.  No acute skeletal abnormality. Other neck: Negative for mass or adenopathy in the neck. Upper chest: Lung apices clear bilaterally. Review of the MIP images confirms the above findings CTA HEAD FINDINGS Anterior circulation: Atherosclerotic calcification in the cavernous carotid bilaterally without significant stenosis. Anterior and middle cerebral arteries patent bilaterally without significant stenosis or large vessel occlusion. Posterior circulation: Left vertebral artery ends in PICA. Right vertebral artery supplies the basilar. 6 mm aneurysm with associated calcification in the wall at the vertebrobasilar junction. Mild stenosis distal right vertebral artery proximal to the aneurysm. Basilar is diminutive due to fetal origin of the posterior circulation bilaterally. No significant stenosis or  large vessel occlusion in the posterior circulation Venous sinuses: Normal venous enhancement Anatomic variants: None Review of the MIP images confirms the above findings IMPRESSION: 1. Negative for intracranial large vessel occlusion 2. 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 3.  Mild atherosclerotic calcification of the carotid bifurcation without significant carotid stenosis. Atherosclerotic calcification in the cavernous carotid bilaterally. Electronically Signed   By: Franchot Gallo M.D.   On: 04/30/2020 16:38   MR BRAIN WO CONTRAST  Result Date: 05/01/2020 CLINICAL DATA:  86 year old male code stroke presentation yesterday. History of 6 x 8 mm aneurysm at the vertebrobasilar junction. EXAM: MRI HEAD WITHOUT CONTRAST TECHNIQUE: Multiplanar, multiecho pulse sequences of the brain and surrounding structures were obtained without intravenous contrast. COMPARISON:  Head CT 04/30/2020 and earlier. Brain MRI 09/03/2018 and earlier. FINDINGS: Brain: No restricted diffusion or evidence of acute infarction. But there is increased susceptibility in the left thalamus since May corresponding to multiple microhemorrhages which are new or increased from that time. The most recent of these appears to be that seen on series 11, image 15 along the dorsal superior thalamus. But there is no regional edema or associated mass effect. Multiple chronic microhemorrhages also in the right thalamus, brainstem and occasionally elsewhere in the cerebral hemispheres appear stable since May. Advanced chronic T2 heterogeneity throughout the bilateral deep gray nuclei. Increased T2 heterogeneity related to lacunar infarcts in the pons since May (series 10, image 8). Additional confluent bilateral cerebral white matter T2 and FLAIR hyperintensity with multiple areas most resembling chronic white matter lacunar infarcts. No definite cortical encephalomalacia. No midline shift, mass effect, evidence of mass lesion, ventriculomegaly, extra-axial collection or acute intracranial hemorrhage. Cervicomedullary junction and pituitary are within normal limits. Vascular: Major intracranial vascular flow voids are stable since May, with vertebrobasilar junction aneurysm redemonstrated on series 10, image 4. Skull and upper  cervical spine: Stable and negative; C2-C3 ankylosis. Sinuses/Orbits: Stable and negative. Other: Mastoids remain clear. Stable visible internal auditory structures. IMPRESSION: 1. Progressed small vessel disease in the left thalamus and pons since May, including a late subacute appearing left thalamic microhemorrhage corresponding to the area of density on CT yesterday. No associated edema or mass effect. 2. No superimposed acute infarct or acute intracranial hemorrhage identified. 3. Underlying severe chronic small vessel disease, chronic vertebrobasilar junction Aneurysm. Electronically Signed   By: Genevie Ann M.D.   On: 05/01/2020 05:05   Chest Port 1 View  Result Date: 04/30/2020 CLINICAL DATA:  Confusion.  Intracranial hemorrhage. EXAM: PORTABLE CHEST 1 VIEW COMPARISON:  Radiograph 03/18/2011 FINDINGS: Upper normal heart size.The cardiomediastinal contours are normal. Mild aortic atherosclerosis. Subsegmental atelectasis in the left mid lung. Pulmonary vasculature is normal. No consolidation, pleural effusion, or pneumothorax. No acute osseous abnormalities are seen. IMPRESSION: Subsegmental atelectasis in the left mid lung. Electronically Signed   By: Keith Rake M.D.   On: 04/30/2020 18:22   ECHOCARDIOGRAM COMPLETE  Result Date: 05/01/2020    ECHOCARDIOGRAM REPORT   Patient Name:   Steven Meadows Date of Exam: 05/01/2020 Medical Rec #:  [id number]      Height:       70.5 in Accession #:    [id number]     Weight:       213.6 lb Date of Birth:  July 22, 1931       BSA:          2.158 m Patient Age:    52 years       BP:  121/105 mmHg Patient Gender: M              HR:           91 bpm. Exam Location:  Inpatient Procedure: 2D Echo, Color Doppler and Cardiac Doppler Indications:    Stroke 434.91 / I163.9  History:        Patient has prior history of Echocardiogram examinations, most                 recent 09/04/2018. Stroke, Arrythmias:Atrial Fibrillation; Risk                 Factors:Hypertension.   Sonographer:    Bernadene Person RDCS Referring Phys: [id number] [place]  1. Left ventricular ejection fraction, by estimation, is 60 to 65%. The left ventricle has normal function. The left ventricle has no regional wall motion abnormalities. Left ventricular diastolic function could not be evaluated.  2. Right ventricular systolic function is normal. The right ventricular size is normal. There is normal pulmonary artery systolic pressure.  3. Left atrial size was moderately dilated.  4. Right atrial size was mildly dilated.  5. The mitral valve is normal in structure. No evidence of mitral valve regurgitation.  6. The aortic valve is tricuspid. Aortic valve regurgitation is not visualized. Mild aortic valve sclerosis is present, with no evidence of aortic valve stenosis.  7. There is mild dilatation of the ascending aorta, measuring 39 mm. FINDINGS  Left Ventricle: Left ventricular ejection fraction, by estimation, is 60 to 65%. The left ventricle has normal function. The left ventricle has no regional wall motion abnormalities. The left ventricular internal cavity size was normal in size. There is  no left ventricular hypertrophy. Left ventricular diastolic function could not be evaluated due to atrial fibrillation. Left ventricular diastolic function could not be evaluated. Right Ventricle: The right ventricular size is normal. No increase in right ventricular wall thickness. Right ventricular systolic function is normal. There is normal pulmonary artery systolic pressure. The tricuspid regurgitant velocity is 2.39 m/s, and  with an assumed right atrial pressure of 3 mmHg, the estimated right ventricular systolic pressure is 0000000 mmHg. Left Atrium: Left atrial size was moderately dilated. Right Atrium: Right atrial size was mildly dilated. Pericardium: There is no evidence of pericardial effusion. Mitral Valve: The mitral valve is normal in structure. Mild mitral annular calcification. No evidence of  mitral valve regurgitation. Tricuspid Valve: The tricuspid valve is normal in structure. Tricuspid valve regurgitation is not demonstrated. Aortic Valve: The aortic valve is tricuspid. Aortic valve regurgitation is not visualized. Mild aortic valve sclerosis is present, with no evidence of aortic valve stenosis. Pulmonic Valve: The pulmonic valve was not well visualized. Pulmonic valve regurgitation is not visualized. Aorta: The aortic root is normal in size and structure. There is mild dilatation of the ascending aorta, measuring 39 mm. IAS/Shunts: No atrial level shunt detected by color flow Doppler.  LEFT VENTRICLE PLAX 2D LVIDd:         5.40 cm LVIDs:         3.80 cm LV PW:         0.90 cm LV IVS:        0.90 cm LVOT diam:     1.90 cm LV SV:         45 LV SV Index:   21 LVOT Area:     2.84 cm  RIGHT VENTRICLE TAPSE (M-mode): 1.7 cm LEFT ATRIUM  Index       RIGHT ATRIUM           Index LA diam:        4.40 cm 2.04 cm/m  RA Area:     16.60 cm LA Vol (A2C):   52.2 ml 24.19 ml/m RA Volume:   34.20 ml  15.85 ml/m LA Vol (A4C):   91.7 ml 42.50 ml/m LA Biplane Vol: 74.7 ml 34.62 ml/m  AORTIC VALVE LVOT Vmax:   91.80 cm/s LVOT Vmean:  60.000 cm/s LVOT VTI:    0.160 m  AORTA Ao Root diam: 3.70 cm Ao Asc diam:  3.90 cm TRICUSPID VALVE TR Peak grad:   22.8 mmHg TR Vmax:        239.00 cm/s  SHUNTS Systemic VTI:  0.16 m Systemic Diam: 1.90 cm Rachelle Hora Croitoru MD Electronically signed by Thurmon Fair MD Signature Date/Time: 05/01/2020/11:55:21 AM    Final    CT HEAD CODE STROKE WO CONTRAST  Result Date: 04/30/2020 CLINICAL DATA:  Code stroke.  Neuro deficit, acute. EXAM: CT HEAD WITHOUT CONTRAST TECHNIQUE: Contiguous axial images were obtained from the base of the skull through the vertex without intravenous contrast. COMPARISON:  Head CT May 1st, 2020. FINDINGS: Brain: 6 mm hyperdense focus in the left thalamus. Gray-white differentiation is maintained. No hydrocephalus, extra-axial collection or mass  lesion. Vascular: No hyperdense vessel. Calcified plaques in the bilateral carotid siphons and intracranial vertebral arteries. Skull: Normal. Negative for fracture or focal lesion. Sinuses/Orbits: No acute finding. ASPECTS [facility] Stroke Program Early CT Score) - Ganglionic level infarction (caudate, lentiform nuclei, internal capsule, insula, M1-M3 cortex): 7 - Supraganglionic infarction (M4-M6 cortex): 3 Total score (0-10 with 10 being normal): 10 IMPRESSION: 1. A 6 mm hyperdense focus in the left thalamus, which may reflect a small hemorrhage. 2. ASPECTS is 10. Findings communicated to Dr. Otelia Limes via [facility] paging system at 16:12 on 04/30/2020. Electronically Signed   By: Baldemar Lenis M.D.   On: 04/30/2020 16:14     Assessment/Plan: Diagnosis: Small left thalamic hemorrhage 1. Does the need for close, 24 hr/day medical supervision in concert with the patient's rehab needs make it unreasonable for this patient to be served in a less intensive setting? Yes 2. Co-Morbidities requiring supervision/potential complications: HTN, OSA, vertebral artery aneurysm, 3 prior ischemic strokes, abdominal aortic atherosclerosis 3. Due to bladder management, bowel management, safety, skin/wound care, disease management, medication administration, pain management and patient education, does the patient require 24 hr/day rehab nursing? Yes 4. Does the patient require coordinated care of a physician, rehab nurse, therapy disciplines of PT, OT, SLP to address physical and functional deficits in the context of the above medical diagnosis(es)? Yes Addressing deficits in the following areas: balance, endurance, locomotion, strength, transferring, bowel/bladder control, bathing, dressing, feeding, grooming, toileting, cognition and psychosocial support 5. Can the patient actively participate in an intensive therapy program of at least 3 hrs of therapy per day at least 5 days per week? Yes 6. The potential  for patient to make measurable gains while on inpatient rehab is excellent 7. Anticipated functional outcomes upon discharge from inpatient rehab are modified independent  with PT, supervision with OT, supervision with SLP. 8. Estimated rehab length of stay to reach the above functional goals is: 7-10 days 9. Anticipated discharge destination: Home 10. Overall Rehab/Functional Prognosis: excellent  RECOMMENDATIONS: This patient's condition is appropriate for continued rehabilitative care in the following setting: CIR Patient has agreed to participate in recommended program. Yes Note  that insurance prior authorization may be required for reimbursement for recommended care.  Comment:  1) Small left thalamic hemorrhage: Admit to CIR once insurance auth is obtained and bed is available, 2) Impaired cognition: Patient will require 24/7 supervision which his wife can provide.  3) Expressive aphasia: He will require SLP.  4) Impaired mobility and ADLs: will likely require 7-10 day stay.  Thank you for this consult. Admission coordinator to follow.   I have personally performed a face to face diagnostic evaluation, including, but not limited to relevant history and physical exam findings, of this patient and developed relevant assessment and plan.  Additionally, I have reviewed and concur with the physician assistant's documentation above.  Leeroy Cha, MD  Bary Leriche, PA-C 05/01/2020

## 2020-05-01 NOTE — Evaluation (Signed)
Occupational Therapy Evaluation Patient Details Name: Steven Meadows MRN: [id number] DOB: October 12, 1931 Today's Date: 05/01/2020    History of Present Illness Steven Meadows is an 86 y.o. male presenting wtih receptive and expressive aphasia and R visual neglect. Imaging revealed acute L thalamic hemorrhage. PMHx of atrial fibrillation (on Eliquis), 3 prior ischemic strokes, abdominal aortic atherosclerosis, vertebral artery aneurysm, HTN, OSA, hypercholesterolemia and rotator cuff tear.   Clinical Impression   This 86 y/o male presents with the above. PTA pt living at home with spouse, was mod independent with ADL and mobility tasks intermittently using SPC in the home. Pt currently presenting with the above and below listed deficits including word finding difficulties and cognitive impairments, decreased standing balance and overall weakness. Pt requiring two person assist (HHA) for safe completion of short distance mobility in room; requiring up to maxA (+2) for LB ADL. VSS throughout on RA with activity. Pt to benefit from continued acute OT services and will benefit from CIR level therapies at time of discharge to maximize his overall safety and independence with ADL and mobility. Will follow.     Follow Up Recommendations  CIR    Equipment Recommendations  Other (comment);3 in 1 bedside commode (TBD)    Recommendations for Other Services Rehab consult     Precautions / Restrictions Precautions Precautions: Fall Precaution Comments: word finding difficulty Restrictions Weight Bearing Restrictions: No      Mobility Bed Mobility Overal bed mobility: Needs Assistance Bed Mobility: Supine to Sit     Supine to sit: Min assist     General bed mobility comments: max verbal and directional cues to complete transfer due to delayed processing, minA for trunk elevation, tactile cues to scoot forward to place feet on the floor    Transfers Overall transfer level: Needs  assistance Equipment used: 2 person hand held assist Transfers: Sit to/from Stand Sit to Stand: Mod assist;+2 physical assistance;+2 safety/equipment         General transfer comment: modA to power up, wide base of support, increased time, signficant trunk flexion    Balance Overall balance assessment: Needs assistance Sitting-balance support: Feet supported;No upper extremity supported Sitting balance-Leahy Scale: Fair     Standing balance support: Bilateral upper extremity supported;During functional activity Standing balance-Leahy Scale: Poor Standing balance comment: dependent on physical assist                           ADL either performed or assessed with clinical judgement   ADL Overall ADL's : Needs assistance/impaired Eating/Feeding: Set up;Sitting   Grooming: Min guard;Sitting   Upper Body Bathing: Minimal assistance;Sitting   Lower Body Bathing: Moderate assistance;+2 for physical assistance;+2 for safety/equipment;Sit to/from stand   Upper Body Dressing : Minimal assistance;Sitting   Lower Body Dressing: Maximal assistance;+2 for physical assistance;+2 for safety/equipment;Sit to/from stand Lower Body Dressing Details (indicate cue type and reason): requires assist to don socks seated EOB Toilet Transfer: Minimal assistance;+2 for physical assistance;+2 for safety/equipment;Ambulation Toilet Transfer Details (indicate cue type and reason): +2 HHA, simulated via transfer to recliner, around EOB to recliner placed on other side of bed Toileting- Clothing Manipulation and Hygiene: Maximal assistance;+2 for physical assistance;+2 for safety/equipment;Sit to/from stand       Functional mobility during ADLs: +2 for physical assistance;+2 for safety/equipment;Moderate assistance (HHA)  Pertinent Vitals/Pain Pain Assessment: Faces Faces Pain Scale: Hurts little more Pain Location: headache Pain Descriptors /  Indicators: Headache Pain Intervention(s): Monitored during session;RN gave pain meds during session     Hand Dominance Right   Extremity/Trunk Assessment Upper Extremity Assessment Upper Extremity Assessment: Generalized weakness   Lower Extremity Assessment Lower Extremity Assessment: Defer to PT evaluation   Cervical / Trunk Assessment Cervical / Trunk Assessment: Normal   Communication Communication Communication: Receptive difficulties;Expressive difficulties   Cognition Arousal/Alertness: Awake/alert Behavior During Therapy: WFL for tasks assessed/performed Overall Cognitive Status: Impaired/Different from baseline Area of Impairment: Orientation;Memory;Following commands;Problem solving                 Orientation Level: Disoriented to;Place;Time;Situation (stated [facility] when given choices. unable to state date despite max verbal cues, stated February of 2058)   Memory: Decreased short-term memory Following Commands: Follows one step commands with increased time     Problem Solving: Slow processing;Difficulty sequencing;Requires verbal cues;Requires tactile cues General Comments: pt with increased processing time, word finding difficulty, grossly 75% accuracy with object identification when given increased time and multimodal cues   General Comments  VSS, 95% SpO2 on RA    Exercises     Shoulder Instructions      Home Living Family/patient expects to be discharged to:: Private residence Living Arrangements: Spouse/significant other Available Help at Discharge: Family;Available 24 hours/day Type of Home: House Home Access: Stairs to enter [employer] of Steps: 2 Entrance Stairs-Rails: Right Home Layout: Two level;Able to live on main level with bedroom/bathroom Alternate Level Stairs-Number of Steps: flight (doesnt ever go upstairs)   Bathroom Shower/Tub: Occupational psychologist: Handicapped height     Home  Equipment: Grab bars - toilet;Grab bars - tub/shower;Cane - single point;Walker - 4 wheels;Walker - 2 wheels          Prior Functioning/Environment Level of Independence: Independent        Comments: gardens, grows Teacher, 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        OT Problem List: Decreased strength;Decreased range of motion;Decreased activity tolerance;Impaired balance (sitting and/or standing);Decreased coordination;Decreased cognition      OT Treatment/Interventions: Self-care/ADL training;Therapeutic exercise;Energy conservation;DME and/or AE instruction;Therapeutic activities;Cognitive remediation/compensation;Patient/family education;Balance training    OT Goals(Current goals can be found in the care plan section) Acute Rehab OT Goals Patient Stated Goal: didnt state OT Goal Formulation: With patient Time For Goal Achievement: 05/15/20 Potential to Achieve Goals: Good  OT Frequency: Min 2X/week   Barriers to D/C:            Co-evaluation PT/OT/SLP Co-Evaluation/Treatment: Yes Reason for Co-Treatment: For patient/therapist safety;To address functional/ADL transfers   OT goals addressed during session: ADL's and self-care      AM-PAC OT "6 Clicks" Daily Activity     Outcome Measure Help from another person eating meals?: A Little Help from another person taking care of personal grooming?: A Little Help from another person toileting, which includes using toliet, bedpan, or urinal?: A Lot Help from another person bathing (including washing, rinsing, drying)?: A Lot Help from another person to put on and taking off regular upper body clothing?: A Little Help from another person to put on and taking off regular lower body clothing?: A Lot 6 Click Score: 15   End of Session Equipment Utilized During Treatment: Gait belt Nurse Communication: Mobility status  Activity Tolerance: Patient tolerated treatment well Patient left: in chair;with call bell/phone within reach;with chair  alarm set;with  family/visitor present  OT Visit Diagnosis: Unsteadiness on feet (R26.81);Muscle weakness (generalized) (M62.81);Cognitive communication deficit (R41.841);Other symptoms and signs involving cognitive function                Time: 0814-4818 OT Time Calculation (min): 34 min Charges:  OT General Charges $OT Visit: 1 Visit OT Evaluation $OT Eval Moderate Complexity: 1 Mod  Marcy Siren, OT Acute Rehabilitation Services Pager [phone] Office [phone]   Orlando Penner 05/01/2020, 2:48 PM

## 2020-05-02 ENCOUNTER — Inpatient Hospital Stay (HOSPITAL_COMMUNITY): Payer: Medicare Other

## 2020-05-02 DIAGNOSIS — I4819 Other persistent atrial fibrillation: Secondary | ICD-10-CM | POA: Diagnosis not present

## 2020-05-02 DIAGNOSIS — Z7901 Long term (current) use of anticoagulants: Secondary | ICD-10-CM | POA: Diagnosis not present

## 2020-05-02 DIAGNOSIS — Z8673 Personal history of transient ischemic attack (TIA), and cerebral infarction without residual deficits: Secondary | ICD-10-CM | POA: Diagnosis not present

## 2020-05-02 DIAGNOSIS — I610 Nontraumatic intracerebral hemorrhage in hemisphere, subcortical: Secondary | ICD-10-CM | POA: Diagnosis not present

## 2020-05-02 LAB — BASIC METABOLIC PANEL
Anion gap: 12 (ref 5–15)
BUN: 14 mg/dL (ref 8–23)
CO2: 25 mmol/L (ref 22–32)
Calcium: 9.7 mg/dL (ref 8.9–10.3)
Chloride: 94 mmol/L — ABNORMAL LOW (ref 98–111)
Creatinine, Ser: 1.23 mg/dL (ref 0.61–1.24)
GFR, Estimated: 57 mL/min — ABNORMAL LOW (ref >60)
Glucose, Bld: 112 mg/dL — ABNORMAL HIGH (ref 70–99)
Potassium: 3.5 mmol/L (ref 3.5–5.1)
Sodium: 131 mmol/L — ABNORMAL LOW (ref 135–145)

## 2020-05-02 LAB — CBC
HCT: 46.9 % (ref 39.0–52.0)
Hemoglobin: 16.3 g/dL (ref 13.0–17.0)
MCH: 29.2 pg (ref 26.0–34.0)
MCHC: 34.8 g/dL (ref 30.0–36.0)
MCV: 83.9 fL (ref 80.0–100.0)
Platelets: 178 10*3/uL (ref 150–400)
RBC: 5.59 MIL/uL (ref 4.22–5.81)
RDW: 13.3 % (ref 11.5–15.5)
WBC: 8.3 10*3/uL (ref 4.0–10.5)
nRBC: 0 % (ref 0.0–0.2)

## 2020-05-02 LAB — GLUCOSE, CAPILLARY: Glucose-Capillary: 144 mg/dL — ABNORMAL HIGH (ref 70–99)

## 2020-05-02 NOTE — PMR Pre-admission (Signed)
PMR Admission Coordinator Pre-Admission Assessment  Patient: Steven Meadows is an 86 y.o., male MRN: [id number] DOB: 1931-12-24 Height: 5' 10.5" (179.1 cm) (From Dr. Gabriel Carina today) Weight: 96.9 kg (weight from Dr. office to today)              Insurance Information HMO:     PPO:      PCP:      IPA:      80/20:      OTHER:  PRIMARY: Medicare a and b      Policy#: [id number]      Subscriber: pt Benefits:  Phone #: passport one online     Name: 12/29 Eff. Date: a 12/04/1998 and b 10/04/2015     Deduct: $1484      Out of Pocket Max: none      Life Max: none  CIR: 100%      SNF: 20 full days Outpatient: 80%     Co-Pay: 20% Home Health: 100%      Co-Pay: none DME: 80%     Co-Pay: 20% Providers: pt choice  SECONDARY: BCBS supplement      Policy#: [id number]       Financial Counselor:       Phone#:   The Therapist, art Information Summary" for patients in Inpatient Rehabilitation Facilities with attached "Privacy Act [place] Records" was provided and verbally reviewed with: Patient and Family  Emergency Contact Information Contact Information    Name Relation Home Work [place] Spouse [phone]  [phone]   page, beth Daughter   [phone]     Current Medical History  Patient Admitting Diagnosis: ICH  History of Present Illness:  86 year old right-handed male with history of hypertension atrial fibrillation on Eliquis, severe neuropathy prior CVA, VA aneurysm who was admitted 04/30/2020 with acute onset of confusion and garbled speech.     CT of the head showed a 6 mm hyperdense focus in the left thalamus reflecting small hemorrhage.  CT angiogram head and neck negative for intracranial large vessel occlusion.  Eliquis was reversed with Andexxa.  Neurology services felt small thalamic hemorrhage in setting of coagulopathy due to Eliquis maintained on low-dose Cleviprex for blood pressure management.  Follow-up MRI of the brain revealed progression of  small vessel disease in the left thalamus and pons with late subacute left thalamic micro hemorrhage and severe chronic small vessel disease.  Tolerating a regular consistency diet.  Complete NIHSS TOTAL: 2 Glasgow Coma Scale Score: 15  Past Medical History  Past Medical History:  Diagnosis Date  . Abdominal aortic atherosclerosis ([place])   . Actinic keratoses   . 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 Atrial fibrillation ([place])   . Cough   . History of mononucleosis   . HTN (hypertension)   . Hypercholesteremia   . Lacunar stroke ([place])   . Low back pain   . Olecranon bursitis   . Rotator cuff tear   . Stroke [facility])     Family History  family history includes Cancer in his paternal grandmother; Healthy in his father, mother, and sister; Heart attack in his maternal grandfather and paternal grandfather; Transient ischemic attack in his mother.  Prior Rehab/Hospitalizations:  Has the patient had prior rehab or hospitalizations prior to admission? Yes  Has the patient had major surgery during 100 days prior to admission? No  Current Medications   Current Facility-Administered Medications:  .  acetaminophen (TYLENOL) tablet 650 mg, 650 mg, Oral, Q4H PRN, 650  mg at 05/03/20 1002 **OR** acetaminophen (TYLENOL) 160 MG/5ML solution 650 mg, 650 mg, Per Tube, Q4H PRN **OR** acetaminophen (TYLENOL) suppository 650 mg, 650 mg, Rectal, Q4H PRN, Kerney Elbe, MD .  chlorhexidine gluconate (MEDLINE KIT) (PERIDEX) 0.12 % solution 15 mL, 15 mL, Mouth Rinse, QHS, Kerney Elbe, MD, 15 mL at 04/30/20 2142 .  Chlorhexidine Gluconate Cloth 2 % PADS 6 each, 6 each, Topical, Daily, Amie Portland, MD, 6 each at 05/03/20 1003 .  diltiazem (CARDIZEM CD) 24 hr capsule 240 mg, 240 mg, Oral, Daily, Kerney Elbe, MD, 240 mg at 05/03/20 1002 .  ezetimibe (ZETIA) tablet 10 mg, 10 mg, Oral, Daily, Kerney Elbe, MD, 10 mg at 05/03/20 1002 .  hydrALAZINE (APRESOLINE) tablet 25 mg, 25 mg, Oral, BID, Kerney Elbe,  MD, 25 mg at 05/03/20 1002 .  labetalol (NORMODYNE) injection 5-20 mg, 5-20 mg, Intravenous, Q2H PRN, Rosalin Hawking, MD .  pantoprazole (PROTONIX) EC tablet 40 mg, 40 mg, Oral, Daily, Rosalin Hawking, MD, 40 mg at 05/03/20 1002 .  senna-docusate (Senokot-S) tablet 1 tablet, 1 tablet, Oral, BID, Kerney Elbe, MD, 1 tablet at 05/03/20 1003 .  triamterene-hydrochlorothiazide (MAXZIDE-25) 37.5-25 MG per tablet 1 tablet, 1 tablet, Oral, Daily, Kerney Elbe, MD, 1 tablet at 05/03/20 1002  Patients Current Diet:  Diet Order            Diet Heart Room service appropriate? 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  Independent  Indoor Mobility: Did the patient need assistance with walking from room to room (with or without device)? Independent  Stairs: Did the patient need assistance with internal or external stairs (with or without device)? Independent  Functional Cognition: Did the patient need help planning regular tasks such as shopping or remembering to take medications? Independent  Home Assistive Devices / Equipment Home Assistive Devices/Equipment: None Home Equipment: Grab bars - toilet,Grab bars - tub/shower,Cane - single point,Walker - 4 wheels,Walker - 2 wheels  Prior Device Use: Indicate devices/aids used by the patient prior to current illness, exacerbation or injury? Walker  Current  Functional Level Cognition  Overall Cognitive Status: Impaired/Different from baseline Orientation Level: Oriented X4 Following Commands: Follows one step commands with increased time General Comments: Mild impairments.  Performed cognitive task with ambulation (naming foods that begin with letters of alphebet) - required increased time but was able to perform.  Pt able to carry on conversation regarding current events    Extremity Assessment (includes Sensation/Coordination)  Upper Extremity Assessment: Generalized weakness  Lower Extremity Assessment: Defer to PT evaluation    ADLs  Overall ADL's : Needs assistance/impaired Eating/Feeding: Set up,Sitting Grooming: Min guard,Sitting Upper Body Bathing: Minimal assistance,Sitting Lower Body Bathing: Moderate assistance,+2 for physical assistance,+2 for safety/equipment,Sit to/from stand Upper Body Dressing : Minimal assistance,Sitting Lower Body Dressing: Maximal assistance,+2 for physical assistance,+2 for safety/equipment,Sit to/from stand Lower Body Dressing Details (indicate cue  type and reason): requires assist to don socks seated EOB Toilet Transfer: Minimal assistance,+2 for physical assistance,+2 for safety/equipment,Ambulation Toilet Transfer Details (indicate cue type and reason): +2 HHA, simulated via transfer to recliner, around EOB to recliner placed on other side of bed Toileting- Clothing Manipulation and Hygiene: Maximal assistance,+2 for physical assistance,+2 for safety/equipment,Sit to/from stand Functional mobility during ADLs: +2 for physical assistance,+2 for safety/equipment,Moderate assistance (HHA)    Mobility  Overal bed mobility: Needs Assistance Bed Mobility: Supine to Sit Supine to sit: Min assist General bed mobility comments: Min A to steady trunk    Transfers  Overall transfer level: Needs assistance Equipment used: Rolling walker (2 wheeled) Transfers: Sit to/from Stand Sit to Stand: Min  guard General transfer comment: Performed x 2; min guard to steady    Ambulation / Gait / Stairs / Emergency planning/management officer  Ambulation/Gait Ambulation/Gait assistance: Counsellor (Feet): 120 Feet Assistive device: Rolling walker (2 wheeled) Gait Pattern/deviations: Step-through pattern,Trunk flexed,Shuffle General Gait Details: Cues for posture and increased foot clearance.  Had pt identify items with ambulation and perform cognitive task.  Pt was also able to read room signs on L and R but did have to stop. Gait velocity: slow Gait velocity interpretation: <1.8 ft/sec, indicate of risk for recurrent falls    Posture / Balance Balance Overall balance assessment: Needs assistance Sitting-balance support: Feet supported,No upper extremity supported Sitting balance-Leahy Scale: Good Standing balance support: Bilateral upper extremity supported,During functional activity Standing balance-Leahy Scale: Poor Standing balance comment: Required use of UE but was steady with RW    Special needs/care consideration Hgb A1c 6.0   Previous Home Environment  Living Arrangements: Spouse/significant other  Lives With: Spouse Available Help at Discharge: Family,Available 24 hours/day Type of Home: House Home Layout: Two level,Able to live on main level with bedroom/bathroom Alternate Level Stairs-Number of Steps: flight Home Access: Stairs to enter Entrance Stairs-Rails: Right Entrance Stairs-Number of Steps: 2 Bathroom Shower/Tub: Multimedia programmer: Handicapped height Bathroom Accessibility: Yes How Accessible: Accessible via walker Home Care Services: No  Discharge Living Setting Plans for Discharge Living Setting: Patient's home,Lives with (comment) (wife) Type of Home at Discharge: House Discharge Home Layout: Two level,Able to live on main level with bedroom/bathroom Alternate Level Stairs-Number of Steps: flight Discharge Home Access: Stairs to enter Entrance  Stairs-Rails: Right Entrance Stairs-Number of Steps: 2 Discharge Bathroom Shower/Tub: Walk-in shower Discharge Bathroom Toilet: Handicapped height Discharge Bathroom Accessibility: Yes How Accessible: Accessible via walker Does the patient have any problems obtaining your medications?: No  Social/Family/Support Systems Contact Information: wife, Benjamine Mola Anticipated Caregiver: wife Anticipated Ambulance person Information: see above Ability/Limitations of Caregiver: none Caregiver Availability: 24/7 Discharge Plan Discussed with Primary Caregiver: Yes Is Caregiver In Agreement with Plan?: Yes Does Caregiver/Family have Issues with Lodging/Transportation while Pt is in Rehab?: No  Goals Patient/Family Goal for Rehab: Mod I ot supervision with PT, OT, and SLP Expected length of stay: ELOS 7 to 10 days Pt/Family Agrees to Admission and willing to participate: Yes Program Orientation Provided & Reviewed with Pt/Caregiver Including Roles  & Responsibilities: Yes  Decrease burden of Care through IP rehab admission: n/a  Possible need for SNF placement upon discharge: Not anticipated  Patient Condition: This patient's condition remains as documented in the consult dated 05/01/2020, in which the Rehabilitation Physician determined and documented that the patient's condition is appropriate for intensive rehabilitative care in an inpatient rehabilitation facility. Will admit to inpatient rehab today.  Preadmission Screen Completed By:  Julious Payer,  Audelia Acton, RN, 05/03/2020 10:35 AM ______________________________________________________________________   Discussed status with Dr. Ranell Patrick on 05/03/2020 at  1035 and received approval for admission today.  Admission Coordinator:  Cleatrice Burke, time 7001 Date 05/03/2020

## 2020-05-02 NOTE — Progress Notes (Signed)
STROKE TEAM PROGRESS NOTE   INTERVAL HISTORY Wife at the bedside. Pt lying in bed, awake alert and in good spirit. Speech much improved but still has right facial droop, moving all extremities well. PT/OT recommend CIR.    Vitals:   05/02/20 0700 05/02/20 0800 05/02/20 0801 05/02/20 0900  BP: 101/63 (!) 98/58  101/60  Pulse: 61 73  69  Resp: 10 16  (!) 7  Temp:   97.8 F (36.6 C)   TempSrc:   Axillary   SpO2: 100% 95%  98%  Weight:      Height:       CBC:  Recent Labs  Lab 04/30/20 1602 04/30/20 1613 05/01/20 0707 05/02/20 0320  WBC 7.3  --  9.0 8.3  NEUTROABS 4.4  --   --   --   HGB 15.7   < > 16.9 16.3  HCT 47.4   < > 48.3 46.9  MCV 87.3  --  84.9 83.9  PLT 182  --  197 178   < > = values in this interval not displayed.   Basic Metabolic Panel:  Recent Labs  Lab 05/01/20 0707 05/02/20 0320  NA 131* 131*  K 5.6* 3.5  CL 93* 94*  CO2 25 25  GLUCOSE 103* 112*  BUN 11 14  CREATININE 0.98 1.23  CALCIUM 9.9 9.7   Lipid Panel:  Recent Labs  Lab 05/01/20 0707  CHOL 126  TRIG 259*  HDL 43  CHOLHDL 2.9  VLDL 52*  LDLCALC 31   HgbA1c:  Recent Labs  Lab 05/01/20 0707  HGBA1C 6.0*   Urine Drug Screen:  Recent Labs  Lab 04/30/20 1602  LABOPIA NONE DETECTED  COCAINSCRNUR NONE DETECTED  LABBENZ NONE DETECTED  AMPHETMU NONE DETECTED  THCU NONE DETECTED  LABBARB NONE DETECTED    Alcohol Level  Recent Labs  Lab 04/30/20 1602  ETH <10    IMAGING past 24 hours CT HEAD WO CONTRAST  Result Date: 05/02/2020 CLINICAL DATA:  86 year old male code stroke presentation on 04/30/2020, with suspected subacute left thalamic microhemorrhage. EXAM: CT HEAD WITHOUT CONTRAST TECHNIQUE: Contiguous axial images were obtained from the base of the skull through the vertex without intravenous contrast. COMPARISON:  Brain MRI 05/01/2020 and earlier. FINDINGS: Brain: Dorsal left thalamic hyperdense hemorrhage measuring 4 mm has faded since 04/30/2020 (series 3, image 20).  No associated edema or mass effect. No intraventricular or extra-axial extension. Underlying advanced chronic small vessel disease with confluent hypodensity in the bilateral cerebral white matter and extensive deep gray nuclei and pons heterogeneity. No midline shift, ventriculomegaly, mass effect, evidence of mass lesion, new intracranial hemorrhage or evidence of cortically based acute infarction. Vascular: Calcified atherosclerosis at the skull base. Skull: No acute osseous abnormality identified. Sinuses/Orbits: Visualized paranasal sinuses and mastoids are stable and well pneumatized. Other: Visualized orbits and scalp soft tissues are within normal limits. IMPRESSION: 1. Partially regressed punctate left thalamic hemorrhage since 04/30/2020. No complicating features. 2. Advanced chronic small vessel disease. No new intracranial abnormality. Electronically Signed   By: Odessa Fleming M.D.   On: 05/02/2020 04:15   ECHOCARDIOGRAM COMPLETE  Result Date: 05/01/2020    ECHOCARDIOGRAM REPORT   Patient Name:   Steven Meadows Date of Exam: 05/01/2020 Medical Rec #:  [id number]      Height:       70.5 in Accession #:    [id number]     Weight:       213.6 lb Date of Birth:  07-Mar-1932  BSA:          2.158 m Patient Age:    33 years       BP:           121/105 mmHg Patient Gender: M              HR:           91 bpm. Exam Location:  Inpatient Procedure: 2D Echo, Color Doppler and Cardiac Doppler Indications:    Stroke 434.91 / I163.9  History:        Patient has prior history of Echocardiogram examinations, most                 recent 09/04/2018. Stroke, Arrythmias:Atrial Fibrillation; Risk                 Factors:Hypertension.  Sonographer:    Bernadene Person RDCS Referring Phys: [id number] [place]  1. Left ventricular ejection fraction, by estimation, is 60 to 65%. The left ventricle has normal function. The left ventricle has no regional wall motion abnormalities. Left ventricular diastolic function could  not be evaluated.  2. Right ventricular systolic function is normal. The right ventricular size is normal. There is normal pulmonary artery systolic pressure.  3. Left atrial size was moderately dilated.  4. Right atrial size was mildly dilated.  5. The mitral valve is normal in structure. No evidence of mitral valve regurgitation.  6. The aortic valve is tricuspid. Aortic valve regurgitation is not visualized. Mild aortic valve sclerosis is present, with no evidence of aortic valve stenosis.  7. There is mild dilatation of the ascending aorta, measuring 39 mm. FINDINGS  Left Ventricle: Left ventricular ejection fraction, by estimation, is 60 to 65%. The left ventricle has normal function. The left ventricle has no regional wall motion abnormalities. The left ventricular internal cavity size was normal in size. There is  no left ventricular hypertrophy. Left ventricular diastolic function could not be evaluated due to atrial fibrillation. Left ventricular diastolic function could not be evaluated. Right Ventricle: The right ventricular size is normal. No increase in right ventricular wall thickness. Right ventricular systolic function is normal. There is normal pulmonary artery systolic pressure. The tricuspid regurgitant velocity is 2.39 m/s, and  with an assumed right atrial pressure of 3 mmHg, the estimated right ventricular systolic pressure is 0000000 mmHg. Left Atrium: Left atrial size was moderately dilated. Right Atrium: Right atrial size was mildly dilated. Pericardium: There is no evidence of pericardial effusion. Mitral Valve: The mitral valve is normal in structure. Mild mitral annular calcification. No evidence of mitral valve regurgitation. Tricuspid Valve: The tricuspid valve is normal in structure. Tricuspid valve regurgitation is not demonstrated. Aortic Valve: The aortic valve is tricuspid. Aortic valve regurgitation is not visualized. Mild aortic valve sclerosis is present, with no evidence of aortic  valve stenosis. Pulmonic Valve: The pulmonic valve was not well visualized. Pulmonic valve regurgitation is not visualized. Aorta: The aortic root is normal in size and structure. There is mild dilatation of the ascending aorta, measuring 39 mm. IAS/Shunts: No atrial level shunt detected by color flow Doppler.  LEFT VENTRICLE PLAX 2D LVIDd:         5.40 cm LVIDs:         3.80 cm LV PW:         0.90 cm LV IVS:        0.90 cm LVOT diam:     1.90 cm LV SV:  45 LV SV Index:   21 LVOT Area:     2.84 cm  RIGHT VENTRICLE TAPSE (M-mode): 1.7 cm LEFT ATRIUM             Index       RIGHT ATRIUM           Index LA diam:        4.40 cm 2.04 cm/m  RA Area:     16.60 cm LA Vol (A2C):   52.2 ml 24.19 ml/m RA Volume:   34.20 ml  15.85 ml/m LA Vol (A4C):   91.7 ml 42.50 ml/m LA Biplane Vol: 74.7 ml 34.62 ml/m  AORTIC VALVE LVOT Vmax:   91.80 cm/s LVOT Vmean:  60.000 cm/s LVOT VTI:    0.160 m  AORTA Ao Root diam: 3.70 cm Ao Asc diam:  3.90 cm TRICUSPID VALVE TR Peak grad:   22.8 mmHg TR Vmax:        239.00 cm/s  SHUNTS Systemic VTI:  0.16 m Systemic Diam: 1.90 cm Mihai Croitoru MD Electronically signed by Sanda Klein MD Signature Date/Time: 05/01/2020/11:55:21 AM    Final     PHYSICAL EXAM   Temp:  [97.8 F (36.6 C)-98.5 F (36.9 C)] 97.8 F (36.6 C) (12/29 0801) Pulse Rate:  [61-148] 69 (12/29 0900) Resp:  [7-23] 7 (12/29 0900) BP: (74-160)/(35-100) 101/60 (12/29 0900) SpO2:  [86 %-100 %] 98 % (12/29 0900)  General - Well nourished, well developed, in no apparent distress.  Ophthalmologic - fundi not visualized due to noncooperation.  Cardiovascular - irregularly irregular heart rate and rhythm.  Neuro - awake alert, eyes open, orientated to self, people, place and month but not to year. Relatively fluent speech, able to name and repeat following all simple commands now. PERRL. Visual field full, no gaze palsy. Mild right facial droop. Tongue protrusion midline. Moving all extremities  symmetrically and equally. FTN intact bilaterally. Symmetric sensation throughout. Gait not tested.     ASSESSMENT/PLAN Steven Meadows is a 86 y.o. male with history of AF on Eliquis, 3 prior ischemic strokes, abdominal aortic atherosclerosis, vertebral artery aneurysm, HTN, OSA, hypercholesterolemia and rotator cuff tear presenting with aphasia.   Stroke:   Small L thalamic hemorrhage in setting of Eliquis coagulopathy s/p reversal w/ Andexxa   Code Stroke CT head L thalamus hyperdensity. ASPECTS 10.     CTA head & neck no LVO. 70mm R VBJ aneurysm stable. Mild atherosclerosis ICA bifurcation w/ B cavernous calcification  MRI  Progressed small vessel disease L thalamus and pons w/ late subacute L thalamic microhemorrhage. No acute infarct or hemorrhage. Severe small vessel disease. Chronic VBJ aneurysm.   Repeat CT head partially regressed punctate L thalamic hemorrhage  2D Echo EF 60-65%. No source of embolus.   LDL 31  HgbA1c 6.0  VTE prophylaxis - SCDs   Eliquis (apixaban) daily prior to admission, reversed w/ andexxa. Not on antithrombotics now due to [place]. May consider to resume eliquis once bleeding absorbed. Follow up in clinic  Therapy recommendations:  CIR  Disposition:  pending   Atrial Fibrillation  Home anticoagulation:  Eliquis (apixaban) daily  . Off AC d/t hemorrhage . Resume home cardizem   May consider to resume eliquis once bleeding absorbed. Follow up in clinic  Hx stroke/TIA  12/2014 - right splenium infarct on MRI. MRA stable right VA, VBJ fusifum aneurysm. LDL 62 and A1C 6.5. EF 50-55% and CUS neg. Found to have afib RVR, He was discharged with eliquis and  cardizem  09/2018 -  right CR small infarct, likely small vessel disease given location. However, he does have afib so embolic etiology can not be completely ruled out. CTA head and neck stable VBJ aneurysm. LDL 54 and A1C 6.0. D/c on asa and eliquis as well as crestor and zetia.  Followed up with  Dr. Leonie Man at [facility], ASA discontinued, but continued on eliquis  03/2019 EMG showed severe sensorimotor anxonal polyneuropathy  Hypertension  Home meds:  Diltiazem 240 CD, hydralazine 25 bid, triamterene-HCTZ 37.5-25 daily . Off Cleviprex gtt   Home meds resumed . SBP goal <160  . Long-term BP goal normotensive  Hyperlipidemia  Home meds:  crestor 10 + Zetia 10  LDL 31, goal < 70  Resumed home zetia  Discontinue statin given ICH in the setting of low LDL  Other Stroke Risk Factors  Advanced Age >/= 65   ETOH use, alcohol level <10, advised to drink no more than 2 drink(s) a day  Obesity, Body mass index is 30.22 kg/m., BMI >/= 30 associated with increased stroke risk, recommend weight loss, diet and exercise as appropriate   AAA  Other Active Problems  Chronic VBJ aneurysm, stable, followed by [facility]   CXR atx L mid lung  Hyperkalemia K 3.8->3.7->5.6 (hemolysis) ->3.5 - resolved  Hyponatremia Na 132->133->131->131  Cre 0.98->1.23  Hospital day # 2  Rosalin Hawking, MD PhD Stroke Neurology 05/02/2020 10:10 AM    To contact Stroke Continuity provider, please refer to [email or web address] After hours, contact General Neurology

## 2020-05-02 NOTE — Progress Notes (Signed)
Physical Therapy Treatment Patient Details Name: Steven Meadows MRN: [id number] DOB: 1931/06/13 Today's Date: 05/02/2020    History of Present Illness Steven Meadows is an 86 y.o. male presenting wtih receptive and expressive aphasia and R visual neglect. Imaging revealed acute L thalamic hemorrhage. PMHx of atrial fibrillation (on Eliquis), 3 prior ischemic strokes, abdominal aortic atherosclerosis, vertebral artery aneurysm, HTN, OSA, hypercholesterolemia and rotator cuff tear.    PT Comments    Pt demonstrating excellent progress today.  He was able to progress to ambulation with RW and min guard.  Min A for transfers.  Pt requiring min cues for safety and performed cognitive task with ambulation with increased time.  Continue plan of care.     Follow Up Recommendations  CIR     Equipment Recommendations  3in1 (PT) (shower seat)    Recommendations for Other Services       Precautions / Restrictions Precautions Precautions: Fall    Mobility  Bed Mobility Overal bed mobility: Needs Assistance Bed Mobility: Supine to Sit     Supine to sit: Min assist     General bed mobility comments: Min A to steady trunk  Transfers Overall transfer level: Needs assistance Equipment used: Rolling walker (2 wheeled) Transfers: Sit to/from Stand Sit to Stand: Min guard         General transfer comment: Performed x 2; min guard to steady  Ambulation/Gait Ambulation/Gait assistance: Min guard Gait Distance (Feet): 120 Feet Assistive device: Rolling walker (2 wheeled) Gait Pattern/deviations: Step-through pattern;Trunk flexed;Shuffle Gait velocity: slow   General Gait Details: Cues for posture and increased foot clearance.  Had pt identify items with ambulation and perform cognitive task.  Pt was also able to read room signs on L and R but did have to stop.   Stairs             Wheelchair Mobility    Modified Rankin (Stroke Patients Only) Modified Rankin (Stroke  Patients Only) Pre-Morbid Rankin Score: Slight disability Modified Rankin: Moderately severe disability     Balance Overall balance assessment: Needs assistance Sitting-balance support: Feet supported;No upper extremity supported Sitting balance-Leahy Scale: Good     Standing balance support: Bilateral upper extremity supported;During functional activity Standing balance-Leahy Scale: Poor Standing balance comment: Required use of UE but was steady with RW                            Cognition Arousal/Alertness: Awake/alert Behavior During Therapy: WFL for tasks assessed/performed Overall Cognitive Status: Impaired/Different from baseline                                 General Comments: Mild impairments.  Performed cognitive task with ambulation (naming foods that begin with letters of alphebet) - required increased time but was able to perform.  Pt able to carry on conversation regarding current events      Exercises      General Comments General comments (skin integrity, edema, etc.): VSS      Pertinent Vitals/Pain Pain Assessment: No/denies pain    Home Living                      Prior Function            PT Goals (current goals can now be found in the care plan section) Acute Rehab PT Goals Patient Stated Goal: be  able to return home and to garden PT Goal Formulation: With patient Time For Goal Achievement: 05/15/20 Potential to Achieve Goals: Good Progress towards PT goals: Progressing toward goals    Frequency    Min 4X/week      PT Plan Current plan remains appropriate    Co-evaluation              AM-PAC PT "6 Clicks" Mobility   Outcome Measure  Help needed turning from your back to your side while in a flat bed without using bedrails?: A Little Help needed moving from lying on your back to sitting on the side of a flat bed without using bedrails?: A Little Help needed moving to and from a bed to a chair  (including a wheelchair)?: A Little Help needed standing up from a chair using your arms (e.g., wheelchair or bedside chair)?: A Little Help needed to walk in hospital room?: A Little Help needed climbing 3-5 steps with a railing? : A Little 6 Click Score: 18    End of Session Equipment Utilized During Treatment: Gait belt Activity Tolerance: Patient tolerated treatment well Patient left: in chair;with call bell/phone within reach;with chair alarm set;with family/visitor present Nurse Communication: Mobility status PT Visit Diagnosis: Unsteadiness on feet (R26.81);Muscle weakness (generalized) (M62.81);Difficulty in walking, not elsewhere classified (R26.2)     Time: [id number] PT Time Calculation (min) (ACUTE ONLY): 23 min  Charges:  $Gait Training: 8-22 mins $Therapeutic Activity: 8-22 mins                     Abran Richard, PT Acute Rehab Services Pager [phone] [facility] [place] 05/02/2020, 6:08 PM

## 2020-05-02 NOTE — Progress Notes (Signed)
Inpatient Rehabilitation Admissions Coordinator  I met with patient an his wife at bedside to discuss goals and expectations of a possible [facility] admit. They are in agreement. I am hopeful for a bed in the next 24 to 48 hrs.  Danne Baxter, RN, MSN Rehab Admissions Coordinator [phone] 05/02/2020 1:22 PM

## 2020-05-03 ENCOUNTER — Encounter (HOSPITAL_COMMUNITY): Payer: Self-pay | Admitting: Physical Medicine & Rehabilitation

## 2020-05-03 ENCOUNTER — Other Ambulatory Visit: Payer: Self-pay

## 2020-05-03 ENCOUNTER — Inpatient Hospital Stay (HOSPITAL_COMMUNITY)
Admission: RE | Admit: 2020-05-03 | Discharge: 2020-05-15 | DRG: 057 | Disposition: A | Discharge status: Home Health | Payer: Medicare Other | Source: Intra-hospital | Attending: Physical Medicine & Rehabilitation | Admitting: Physical Medicine & Rehabilitation

## 2020-05-03 DIAGNOSIS — E1140 Type 2 diabetes mellitus with diabetic neuropathy, unspecified: Secondary | ICD-10-CM | POA: Diagnosis present

## 2020-05-03 DIAGNOSIS — K5900 Constipation, unspecified: Secondary | ICD-10-CM | POA: Diagnosis present

## 2020-05-03 DIAGNOSIS — I69122 Dysarthria following nontraumatic intracerebral hemorrhage: Secondary | ICD-10-CM | POA: Diagnosis not present

## 2020-05-03 DIAGNOSIS — E785 Hyperlipidemia, unspecified: Secondary | ICD-10-CM | POA: Diagnosis present

## 2020-05-03 DIAGNOSIS — Z79899 Other long term (current) drug therapy: Secondary | ICD-10-CM | POA: Diagnosis not present

## 2020-05-03 DIAGNOSIS — Z7901 Long term (current) use of anticoagulants: Secondary | ICD-10-CM

## 2020-05-03 DIAGNOSIS — I610 Nontraumatic intracerebral hemorrhage in hemisphere, subcortical: Secondary | ICD-10-CM

## 2020-05-03 DIAGNOSIS — I4891 Unspecified atrial fibrillation: Secondary | ICD-10-CM | POA: Diagnosis present

## 2020-05-03 DIAGNOSIS — I10 Essential (primary) hypertension: Secondary | ICD-10-CM

## 2020-05-03 DIAGNOSIS — R3915 Urgency of urination: Secondary | ICD-10-CM | POA: Diagnosis present

## 2020-05-03 DIAGNOSIS — E876 Hypokalemia: Secondary | ICD-10-CM | POA: Diagnosis present

## 2020-05-03 DIAGNOSIS — E871 Hypo-osmolality and hyponatremia: Secondary | ICD-10-CM | POA: Diagnosis present

## 2020-05-03 DIAGNOSIS — K5901 Slow transit constipation: Secondary | ICD-10-CM

## 2020-05-03 DIAGNOSIS — E1142 Type 2 diabetes mellitus with diabetic polyneuropathy: Secondary | ICD-10-CM | POA: Diagnosis present

## 2020-05-03 DIAGNOSIS — N179 Acute kidney failure, unspecified: Secondary | ICD-10-CM

## 2020-05-03 DIAGNOSIS — E7800 Pure hypercholesterolemia, unspecified: Secondary | ICD-10-CM | POA: Diagnosis present

## 2020-05-03 DIAGNOSIS — I482 Chronic atrial fibrillation, unspecified: Secondary | ICD-10-CM | POA: Diagnosis not present

## 2020-05-03 HISTORY — DX: Nontraumatic intracerebral hemorrhage in hemisphere, subcortical: I61.0

## 2020-05-03 LAB — GLUCOSE, CAPILLARY
Glucose-Capillary: 116 mg/dL — ABNORMAL HIGH (ref 70–99)
Glucose-Capillary: 107 mg/dL — ABNORMAL HIGH (ref 70–99)
Glucose-Capillary: 114 mg/dL — ABNORMAL HIGH (ref 70–99)

## 2020-05-03 LAB — BASIC METABOLIC PANEL
Anion gap: 13 (ref 5–15)
BUN: 19 mg/dL (ref 8–23)
CO2: 22 mmol/L (ref 22–32)
Calcium: 9.6 mg/dL (ref 8.9–10.3)
Chloride: 96 mmol/L — ABNORMAL LOW (ref 98–111)
Creatinine, Ser: 1.25 mg/dL — ABNORMAL HIGH (ref 0.61–1.24)
GFR, Estimated: 56 mL/min — ABNORMAL LOW (ref >60)
Glucose, Bld: 108 mg/dL — ABNORMAL HIGH (ref 70–99)
Potassium: 3.7 mmol/L (ref 3.5–5.1)
Sodium: 131 mmol/L — ABNORMAL LOW (ref 135–145)

## 2020-05-03 LAB — CBC
HCT: 45.4 % (ref 39.0–52.0)
Hemoglobin: 16.1 g/dL (ref 13.0–17.0)
MCH: 29.5 pg (ref 26.0–34.0)
MCHC: 35.5 g/dL (ref 30.0–36.0)
MCV: 83.3 fL (ref 80.0–100.0)
Platelets: 181 10*3/uL (ref 150–400)
RBC: 5.45 MIL/uL (ref 4.22–5.81)
RDW: 13.3 % (ref 11.5–15.5)
WBC: 8.4 10*3/uL (ref 4.0–10.5)
nRBC: 0 % (ref 0.0–0.2)

## 2020-05-03 MED ORDER — ACETAMINOPHEN 160 MG/5ML PO SOLN: 650.0000 mg | ORAL | Status: DC | PRN

## 2020-05-03 MED ORDER — SENNOSIDES-DOCUSATE SODIUM 8.6-50 MG PO TABS
1.0000 | ORAL_TABLET | Freq: Two times a day (BID) | ORAL | Status: DC
  Administered 2020-05-03 – 2020-05-06 (×6): 1 via ORAL
  Filled 2020-05-03 (×6): qty 1

## 2020-05-03 MED ORDER — PANTOPRAZOLE SODIUM 40 MG PO TBEC: 40.0000 mg | DELAYED_RELEASE_TABLET | Freq: Every day | ORAL | 0 refills | Status: DC

## 2020-05-03 MED ORDER — DILTIAZEM HCL ER COATED BEADS 120 MG PO CP24
240.0000 mg | ORAL_CAPSULE | Freq: Every day | ORAL | Status: DC
  Administered 2020-05-04 – 2020-05-15 (×12): 240 mg via ORAL
  Filled 2020-05-03 (×12): qty 2

## 2020-05-03 MED ORDER — ACETAMINOPHEN 650 MG RE SUPP: 650.0000 mg | RECTAL | Status: DC | PRN

## 2020-05-03 MED ORDER — EZETIMIBE 10 MG PO TABS
10.0000 mg | ORAL_TABLET | Freq: Every day | ORAL | Status: DC
  Administered 2020-05-04 – 2020-05-15 (×12): 10 mg via ORAL
  Filled 2020-05-03 (×12): qty 1

## 2020-05-03 MED ORDER — TRIAMTERENE-HCTZ 37.5-25 MG PO TABS
1.0000 | ORAL_TABLET | Freq: Every day | ORAL | Status: DC
  Administered 2020-05-04 – 2020-05-15 (×12): 1 via ORAL
  Filled 2020-05-03 (×12): qty 1

## 2020-05-03 MED ORDER — PANTOPRAZOLE SODIUM 40 MG PO TBEC
40.0000 mg | DELAYED_RELEASE_TABLET | Freq: Every day | ORAL | Status: DC
  Administered 2020-05-04 – 2020-05-15 (×12): 40 mg via ORAL
  Filled 2020-05-03 (×7): qty 1
  Filled 2020-05-03: qty 2
  Filled 2020-05-03 (×4): qty 1

## 2020-05-03 MED ORDER — SENNOSIDES-DOCUSATE SODIUM 8.6-50 MG PO TABS: 1.0000 | ORAL_TABLET | Freq: Two times a day (BID) | ORAL | 0 refills | Status: DC

## 2020-05-03 MED ORDER — ACETAMINOPHEN 325 MG PO TABS
650.0000 mg | ORAL_TABLET | ORAL | Status: DC | PRN
  Administered 2020-05-03 – 2020-05-13 (×12): 650 mg via ORAL
  Filled 2020-05-03 (×12): qty 2

## 2020-05-03 MED ORDER — BLOOD PRESSURE CONTROL BOOK
Freq: Once | Status: AC
  Filled 2020-05-03: qty 1

## 2020-05-03 MED ORDER — HYDRALAZINE HCL 25 MG PO TABS
25.0000 mg | ORAL_TABLET | Freq: Two times a day (BID) | ORAL | Status: DC
  Administered 2020-05-03 – 2020-05-07 (×9): 25 mg via ORAL
  Filled 2020-05-03 (×10): qty 1

## 2020-05-03 NOTE — Discharge Summary (Addendum)
Stroke Discharge Summary  Patient ID: Steven Meadows   MRN: [id number]      DOB: 14-Dec-1931  Date of Admission: 04/30/2020 Date of Discharge: 05/03/2020  Attending Physician:  Rosalin Hawking, MD, Stroke MD Consultant(s):   rehabilitation medicine Patient's PCP:  Josetta Huddle, MD  Discharge Diagnoses:  Active Problems:   ICH (intracerebral hemorrhage) (HCC)  Secondary diagnosis Chronic afib Chronic anticoagulation History of stroke HTN HLD Obesity Chronic VBJ aneurysm  Hyponatremia  Medications to be continued on Rehab Allergies as of 05/03/2020      Reactions   Nabumetone Other (See Comments)   Gi upset   Ace Inhibitors Cough   Flonase [fluticasone Propionate] Other (See Comments)   Causes nosebleeds      Medication List    STOP taking these medications   chlorhexidine 0.12 % solution Commonly known as: PERIDEX   hydrocortisone 2.5 % cream   omeprazole 40 MG capsule Commonly known as: PRILOSEC Replaced by: pantoprazole 40 MG tablet   PRESCRIPTION MEDICATION   rosuvastatin 10 MG tablet Commonly known as: CRESTOR     TAKE these medications   diltiazem 240 MG 24 hr capsule Commonly known as: CARDIZEM CD Take 1 capsule (240 mg total) by mouth daily.   ezetimibe 10 MG tablet Commonly known as: ZETIA Take 10 mg by mouth daily.   hydrALAZINE 25 MG tablet Commonly known as: APRESOLINE Take 25 mg by mouth 2 (two) times daily.   pantoprazole 40 MG tablet Commonly known as: PROTONIX Take 1 tablet (40 mg total) by mouth daily. Replaces: omeprazole 40 MG capsule   senna-docusate 8.6-50 MG tablet Commonly known as: Senokot-S Take 1 tablet by mouth 2 (two) times daily.   triamterene-hydrochlorothiazide 37.5-25 MG tablet Commonly known as: MAXZIDE-25 Take 1 tablet by mouth daily.       LABORATORY STUDIES CBC    Component Value Date/Time   WBC 8.4 05/03/2020 0312   RBC 5.45 05/03/2020 0312   HGB 16.1 05/03/2020 0312   HCT 45.4 05/03/2020 0312    PLT 181 05/03/2020 0312   MCV 83.3 05/03/2020 0312   MCH 29.5 05/03/2020 0312   MCHC 35.5 05/03/2020 0312   RDW 13.3 05/03/2020 0312   LYMPHSABS 1.6 04/30/2020 1602   MONOABS 1.2 (H) 04/30/2020 1602   EOSABS 0.1 04/30/2020 1602   BASOSABS 0.0 04/30/2020 1602   CMP    Component Value Date/Time   NA 131 (L) 05/03/2020 0312   NA 139 08/18/2019 1202   K 3.7 05/03/2020 0312   CL 96 (L) 05/03/2020 0312   CO2 22 05/03/2020 0312   GLUCOSE 108 (H) 05/03/2020 0312   BUN 19 05/03/2020 0312   BUN 14 08/18/2019 1202   CREATININE 1.25 (H) 05/03/2020 0312   CREATININE 0.93 03/08/2015 1020   CALCIUM 9.6 05/03/2020 0312   PROT 6.7 04/30/2020 1602   PROT 6.9 01/27/2019 1146   ALBUMIN 4.2 04/30/2020 1602   AST 26 04/30/2020 1602   ALT 25 04/30/2020 1602   ALKPHOS 28 (L) 04/30/2020 1602   BILITOT 1.2 04/30/2020 1602   GFRNONAA 56 (L) 05/03/2020 0312   GFRAA 61 08/18/2019 1202   COAGS Lab Results  Component Value Date   INR 1.2 04/30/2020   INR 1.2 09/03/2018   INR 0.93 12/20/2014   Lipid Panel    Component Value Date/Time   CHOL 126 05/01/2020 0707   TRIG 259 (H) 05/01/2020 0707   HDL 43 05/01/2020 0707   CHOLHDL 2.9 05/01/2020 [id number]  VLDL 52 (H) 05/01/2020 0707   LDLCALC 31 05/01/2020 0707   HgbA1C  Lab Results  Component Value Date   HGBA1C 6.0 (H) 05/01/2020   Urinalysis    Component Value Date/Time   COLORURINE YELLOW 04/30/2020 1602   APPEARANCEUR CLEAR 04/30/2020 1602   LABSPEC 1.023 04/30/2020 1602   PHURINE 6.0 04/30/2020 1602   GLUCOSEU NEGATIVE 04/30/2020 1602   HGBUR NEGATIVE 04/30/2020 1602   BILIRUBINUR NEGATIVE 04/30/2020 1602   KETONESUR NEGATIVE 04/30/2020 1602   PROTEINUR NEGATIVE 04/30/2020 1602   UROBILINOGEN 0.2 01/05/2009 0535   NITRITE NEGATIVE 04/30/2020 1602   LEUKOCYTESUR NEGATIVE 04/30/2020 1602   Urine Drug Screen     Component Value Date/Time   LABOPIA NONE DETECTED 04/30/2020 1602   COCAINSCRNUR NONE DETECTED 04/30/2020 1602    LABBENZ NONE DETECTED 04/30/2020 1602   AMPHETMU NONE DETECTED 04/30/2020 1602   THCU NONE DETECTED 04/30/2020 1602   LABBARB NONE DETECTED 04/30/2020 1602    Alcohol Level    Component Value Date/Time   ETH <10 04/30/2020 1602     SIGNIFICANT DIAGNOSTIC STUDIES CT Angio Head W or Wo Contrast  Result Date: 04/30/2020 CLINICAL DATA:  Acute neuro deficit.  Aphasia. EXAM: CT ANGIOGRAPHY HEAD AND NECK TECHNIQUE: Multidetector CT imaging of the head and neck was performed using the standard protocol during bolus administration of intravenous contrast. Multiplanar CT image reconstructions and MIPs were obtained to evaluate the vascular anatomy. Carotid stenosis measurements (when applicable) are obtained utilizing NASCET criteria, using the distal internal carotid diameter as the denominator. CONTRAST:  72mL OMNIPAQUE IOHEXOL 350 MG/ML SOLN COMPARISON:  CT head 04/30/2020.  CT angio head and neck 09/03/2018 FINDINGS: CTA NECK FINDINGS Aortic arch: Mild atherosclerotic calcification aortic arch. Proximal great vessels tortuous but widely patent. Right carotid system: Atherosclerotic calcification right carotid bifurcation without significant stenosis. Left carotid system: Atherosclerotic calcification left carotid bifurcation without stenosis Vertebral arteries: Right vertebral artery patent to the basilar. Hypoplastic left vertebral artery ends in PICA Skeleton: Cervical spondylosis.  No acute skeletal abnormality. Other neck: Negative for mass or adenopathy in the neck. Upper chest: Lung apices clear bilaterally. Review of the MIP images confirms the above findings CTA HEAD FINDINGS Anterior circulation: Atherosclerotic calcification in the cavernous carotid bilaterally without significant stenosis. Anterior and middle cerebral arteries patent bilaterally without significant stenosis or large vessel occlusion. Posterior circulation: Left vertebral artery ends in PICA. Right vertebral artery supplies the  basilar. 6 mm aneurysm with associated calcification in the wall at the vertebrobasilar junction. Mild stenosis distal right vertebral artery proximal to the aneurysm. Basilar is diminutive due to fetal origin of the posterior circulation bilaterally. No significant stenosis or large vessel occlusion in the posterior circulation Venous sinuses: Normal venous enhancement Anatomic variants: None Review of the MIP images confirms the above findings IMPRESSION: 1. Negative for intracranial large vessel occlusion 2. 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 3. Mild atherosclerotic calcification of the carotid bifurcation without significant carotid stenosis. Atherosclerotic calcification in the cavernous carotid bilaterally. Electronically Signed   By: Franchot Gallo M.D.   On: 04/30/2020 16:38   CT HEAD WO CONTRAST  Result Date: 05/02/2020 CLINICAL DATA:  86 year old male code stroke presentation on 04/30/2020, with suspected subacute left thalamic microhemorrhage. EXAM: CT HEAD WITHOUT CONTRAST TECHNIQUE: Contiguous axial images were obtained from the base of the skull through the vertex without intravenous contrast. COMPARISON:  Brain MRI 05/01/2020 and earlier. FINDINGS: Brain: Dorsal left thalamic hyperdense hemorrhage measuring 4  mm has faded since 04/30/2020 (series 3, image 20). No associated edema or mass effect. No intraventricular or extra-axial extension. Underlying advanced chronic small vessel disease with confluent hypodensity in the bilateral cerebral white matter and extensive deep gray nuclei and pons heterogeneity. No midline shift, ventriculomegaly, mass effect, evidence of mass lesion, new intracranial hemorrhage or evidence of cortically based acute infarction. Vascular: Calcified atherosclerosis at the skull base. Skull: No acute osseous abnormality identified. Sinuses/Orbits: Visualized paranasal sinuses and mastoids are stable and well pneumatized.  Other: Visualized orbits and scalp soft tissues are within normal limits. IMPRESSION: 1. Partially regressed punctate left thalamic hemorrhage since 04/30/2020. No complicating features. 2. Advanced chronic small vessel disease. No new intracranial abnormality. Electronically Signed   By: Genevie Ann M.D.   On: 05/02/2020 04:15   CT Angio Neck W and/or Wo Contrast  Result Date: 04/30/2020 CLINICAL DATA:  Acute neuro deficit.  Aphasia. EXAM: CT ANGIOGRAPHY HEAD AND NECK TECHNIQUE: Multidetector CT imaging of the head and neck was performed using the standard protocol during bolus administration of intravenous contrast. Multiplanar CT image reconstructions and MIPs were obtained to evaluate the vascular anatomy. Carotid stenosis measurements (when applicable) are obtained utilizing NASCET criteria, using the distal internal carotid diameter as the denominator. CONTRAST:  13mL OMNIPAQUE IOHEXOL 350 MG/ML SOLN COMPARISON:  CT head 04/30/2020.  CT angio head and neck 09/03/2018 FINDINGS: CTA NECK FINDINGS Aortic arch: Mild atherosclerotic calcification aortic arch. Proximal great vessels tortuous but widely patent. Right carotid system: Atherosclerotic calcification right carotid bifurcation without significant stenosis. Left carotid system: Atherosclerotic calcification left carotid bifurcation without stenosis Vertebral arteries: Right vertebral artery patent to the basilar. Hypoplastic left vertebral artery ends in PICA Skeleton: Cervical spondylosis.  No acute skeletal abnormality. Other neck: Negative for mass or adenopathy in the neck. Upper chest: Lung apices clear bilaterally. Review of the MIP images confirms the above findings CTA HEAD FINDINGS Anterior circulation: Atherosclerotic calcification in the cavernous carotid bilaterally without significant stenosis. Anterior and middle cerebral arteries patent bilaterally without significant stenosis or large vessel occlusion. Posterior circulation: Left vertebral  artery ends in PICA. Right vertebral artery supplies the basilar. 6 mm aneurysm with associated calcification in the wall at the vertebrobasilar junction. Mild stenosis distal right vertebral artery proximal to the aneurysm. Basilar is diminutive due to fetal origin of the posterior circulation bilaterally. No significant stenosis or large vessel occlusion in the posterior circulation Venous sinuses: Normal venous enhancement Anatomic variants: None Review of the MIP images confirms the above findings IMPRESSION: 1. Negative for intracranial large vessel occlusion 2. 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 3. Mild atherosclerotic calcification of the carotid bifurcation without significant carotid stenosis. Atherosclerotic calcification in the cavernous carotid bilaterally. Electronically Signed   By: Franchot Gallo M.D.   On: 04/30/2020 16:38   MR BRAIN WO CONTRAST  Result Date: 05/01/2020 CLINICAL DATA:  86 year old male code stroke presentation yesterday. History of 6 x 8 mm aneurysm at the vertebrobasilar junction. EXAM: MRI HEAD WITHOUT CONTRAST TECHNIQUE: Multiplanar, multiecho pulse sequences of the brain and surrounding structures were obtained without intravenous contrast. COMPARISON:  Head CT 04/30/2020 and earlier. Brain MRI 09/03/2018 and earlier. FINDINGS: Brain: No restricted diffusion or evidence of acute infarction. But there is increased susceptibility in the left thalamus since May corresponding to multiple microhemorrhages which are new or increased from that time. The most recent of these appears to be that seen on series 11, image 15 along the dorsal  superior thalamus. But there is no regional edema or associated mass effect. Multiple chronic microhemorrhages also in the right thalamus, brainstem and occasionally elsewhere in the cerebral hemispheres appear stable since May. Advanced chronic T2 heterogeneity throughout the bilateral deep gray  nuclei. Increased T2 heterogeneity related to lacunar infarcts in the pons since May (series 10, image 8). Additional confluent bilateral cerebral white matter T2 and FLAIR hyperintensity with multiple areas most resembling chronic white matter lacunar infarcts. No definite cortical encephalomalacia. No midline shift, mass effect, evidence of mass lesion, ventriculomegaly, extra-axial collection or acute intracranial hemorrhage. Cervicomedullary junction and pituitary are within normal limits. Vascular: Major intracranial vascular flow voids are stable since May, with vertebrobasilar junction aneurysm redemonstrated on series 10, image 4. Skull and upper cervical spine: Stable and negative; C2-C3 ankylosis. Sinuses/Orbits: Stable and negative. Other: Mastoids remain clear. Stable visible internal auditory structures. IMPRESSION: 1. Progressed small vessel disease in the left thalamus and pons since May, including a late subacute appearing left thalamic microhemorrhage corresponding to the area of density on CT yesterday. No associated edema or mass effect. 2. No superimposed acute infarct or acute intracranial hemorrhage identified. 3. Underlying severe chronic small vessel disease, chronic vertebrobasilar junction Aneurysm. Electronically Signed   By: Genevie Ann M.D.   On: 05/01/2020 05:05   Chest Port 1 View  Result Date: 04/30/2020 CLINICAL DATA:  Confusion.  Intracranial hemorrhage. EXAM: PORTABLE CHEST 1 VIEW COMPARISON:  Radiograph 03/18/2011 FINDINGS: Upper normal heart size.The cardiomediastinal contours are normal. Mild aortic atherosclerosis. Subsegmental atelectasis in the left mid lung. Pulmonary vasculature is normal. No consolidation, pleural effusion, or pneumothorax. No acute osseous abnormalities are seen. IMPRESSION: Subsegmental atelectasis in the left mid lung. Electronically Signed   By: Keith Rake M.D.   On: 04/30/2020 18:22   ECHOCARDIOGRAM COMPLETE  Result Date: 05/01/2020     ECHOCARDIOGRAM REPORT   Patient Name:   KANIEL BOTLEY Date of Exam: 05/01/2020 Medical Rec #:  [id number]      Height:       70.5 in Accession #:    [id number]     Weight:       213.6 lb Date of Birth:  April 03, 1932       BSA:          2.158 m Patient Age:    45 years       BP:           121/105 mmHg Patient Gender: M              HR:           91 bpm. Exam Location:  Inpatient Procedure: 2D Echo, Color Doppler and Cardiac Doppler Indications:    Stroke 434.91 / I163.9  History:        Patient has prior history of Echocardiogram examinations, most                 recent 09/04/2018. Stroke, Arrythmias:Atrial Fibrillation; Risk                 Factors:Hypertension.  Sonographer:    Bernadene Person RDCS Referring Phys: [id number] [place]  1. Left ventricular ejection fraction, by estimation, is 60 to 65%. The left ventricle has normal function. The left ventricle has no regional wall motion abnormalities. Left ventricular diastolic function could not be evaluated.  2. Right ventricular systolic function is normal. The right ventricular size is normal. There is normal pulmonary artery systolic pressure.  3. Left atrial size was  moderately dilated.  4. Right atrial size was mildly dilated.  5. The mitral valve is normal in structure. No evidence of mitral valve regurgitation.  6. The aortic valve is tricuspid. Aortic valve regurgitation is not visualized. Mild aortic valve sclerosis is present, with no evidence of aortic valve stenosis.  7. There is mild dilatation of the ascending aorta, measuring 39 mm. FINDINGS  Left Ventricle: Left ventricular ejection fraction, by estimation, is 60 to 65%. The left ventricle has normal function. The left ventricle has no regional wall motion abnormalities. The left ventricular internal cavity size was normal in size. There is  no left ventricular hypertrophy. Left ventricular diastolic function could not be evaluated due to atrial fibrillation. Left ventricular diastolic  function could not be evaluated. Right Ventricle: The right ventricular size is normal. No increase in right ventricular wall thickness. Right ventricular systolic function is normal. There is normal pulmonary artery systolic pressure. The tricuspid regurgitant velocity is 2.39 m/s, and  with an assumed right atrial pressure of 3 mmHg, the estimated right ventricular systolic pressure is 25.8 mmHg. Left Atrium: Left atrial size was moderately dilated. Right Atrium: Right atrial size was mildly dilated. Pericardium: There is no evidence of pericardial effusion. Mitral Valve: The mitral valve is normal in structure. Mild mitral annular calcification. No evidence of mitral valve regurgitation. Tricuspid Valve: The tricuspid valve is normal in structure. Tricuspid valve regurgitation is not demonstrated. Aortic Valve: The aortic valve is tricuspid. Aortic valve regurgitation is not visualized. Mild aortic valve sclerosis is present, with no evidence of aortic valve stenosis. Pulmonic Valve: The pulmonic valve was not well visualized. Pulmonic valve regurgitation is not visualized. Aorta: The aortic root is normal in size and structure. There is mild dilatation of the ascending aorta, measuring 39 mm. IAS/Shunts: No atrial level shunt detected by color flow Doppler.  LEFT VENTRICLE PLAX 2D LVIDd:         5.40 cm LVIDs:         3.80 cm LV PW:         0.90 cm LV IVS:        0.90 cm LVOT diam:     1.90 cm LV SV:         45 LV SV Index:   21 LVOT Area:     2.84 cm  RIGHT VENTRICLE TAPSE (M-mode): 1.7 cm LEFT ATRIUM             Index       RIGHT ATRIUM           Index LA diam:        4.40 cm 2.04 cm/m  RA Area:     16.60 cm LA Vol (A2C):   52.2 ml 24.19 ml/m RA Volume:   34.20 ml  15.85 ml/m LA Vol (A4C):   91.7 ml 42.50 ml/m LA Biplane Vol: 74.7 ml 34.62 ml/m  AORTIC VALVE LVOT Vmax:   91.80 cm/s LVOT Vmean:  60.000 cm/s LVOT VTI:    0.160 m  AORTA Ao Root diam: 3.70 cm Ao Asc diam:  3.90 cm TRICUSPID VALVE TR Peak  grad:   22.8 mmHg TR Vmax:        239.00 cm/s  SHUNTS Systemic VTI:  0.16 m Systemic Diam: 1.90 cm Rachelle Hora Croitoru MD Electronically signed by Thurmon Fair MD Signature Date/Time: 05/01/2020/11:55:21 AM    Final    CT HEAD CODE STROKE WO CONTRAST  Result Date: 04/30/2020 CLINICAL DATA:  Code stroke.  Neuro deficit,  acute. EXAM: CT HEAD WITHOUT CONTRAST TECHNIQUE: Contiguous axial images were obtained from the base of the skull through the vertex without intravenous contrast. COMPARISON:  Head CT May 1st, 2020. FINDINGS: Brain: 6 mm hyperdense focus in the left thalamus. Gray-white differentiation is maintained. No hydrocephalus, extra-axial collection or mass lesion. Vascular: No hyperdense vessel. Calcified plaques in the bilateral carotid siphons and intracranial vertebral arteries. Skull: Normal. Negative for fracture or focal lesion. Sinuses/Orbits: No acute finding. ASPECTS [facility]([place] Stroke Program Early CT Score) - Ganglionic level infarction (caudate, lentiform nuclei, internal capsule, insula, M1-M3 cortex): 7 - Supraganglionic infarction (M4-M6 cortex): 3 Total score (0-10 with 10 being normal): 10 IMPRESSION: 1. A 6 mm hyperdense focus in the left thalamus, which may reflect a small hemorrhage. 2. ASPECTS is 10. Findings communicated to Dr. Otelia LimesLindzen [facility] at 16:12 on 04/30/2020. Electronically Signed   By: Baldemar LenisKatyucia  De Macedo Rodrigues M.D.   On: 04/30/2020 16:14       HISTORY OF PRESENT ILLNESS Steven Meadows is an 86 y.o. male with a PMHx of atrial fibrillation (on Eliquis), 3 prior ischemic strokes, abdominal aortic atherosclerosis, vertebral artery aneurysm, HTN, OSA, hypercholesterolemia and rotator cuff tear who presents from home for evaluation of acute onset aphasia. LKN was 2130. This AM, his wife noted him to be "confused". She does not specifically endorse an aphasia, just confusion.   On arrival the patient has garbled speech. No lateralized weakness or other  complaints, but due to patient's impaired speech he cannot provide a reliable ROS.   His last dose of Eliquis was this morning.   HOSPITAL COURSE Steven Meadows is a 86 y.o. male with history of AF on Eliquis, 3 prior ischemic strokes, abdominal aortic atherosclerosis, vertebral artery aneurysm, HTN,OSA,hypercholesterolemia and rotator cuff tear presenting with aphasia.   ICH:   Small L thalamic hemorrhage in setting of Eliquis coagulopathy s/p reversal w/ Andexxa   Code Stroke CT head L thalamus hyperdensity. ASPECTS 10.     CTA head & neck no LVO. 6mm R VBJ aneurysm stable. Mild atherosclerosis ICA bifurcation w/ B cavernous calcification  MRI  Progressed small vessel disease L thalamus and pons w/ late subacute L thalamic microhemorrhage. No acute infarct or hemorrhage. Severe small vessel disease. Chronic VBJ aneurysm.   Repeat CT head partially regressed punctate L thalamic hemorrhage  2D Echo EF 60-65%. No source of embolus.   LDL 31  HgbA1c 6.0  VTE prophylaxis - SCDs   Eliquis (apixaban) daily prior to admission, reversed w/ andexxa. Not on antithrombotics now due to ICH. May consider to resume eliquis once bleeding absorbed. Follow up in clinic  Therapy recommendations:  CIR  Disposition:  pending   Atrial Fibrillation  Home anticoagulation:  Eliquis (apixaban) daily   Off AC d/t hemorrhage  Resume home cardizem   May consider to resume eliquis once bleeding absorbed. Follow up in clinic  Hx stroke/TIA ? 12/2014 - right splenium infarct on MRI. MRA stable right VA, VBJ fusifum aneurysm. LDL 62 and A1C 6.5. EF 50-55% and CUS neg. Found to have afib RVR, He was discharged with eliquis and cardizem ? 09/2018 -  right CR small infarct, likely small vessel disease given location. However, he does have afib so embolic etiology can not be completely ruled out. CTA head and neck stable VBJ aneurysm. LDL 54 and A1C 6.0. D/c on asa and eliquis as well as crestor and  zetia. ? [facility] ASA  discontinued, but continued on eliquis ? 03/2019 EMG showed severe sensorimotor anxonal polyneuropathy  Hypertension  Home meds:  Diltiazem 240 CD, hydralazine 25 bid, triamterene-HCTZ 37.5-25 daily  Off Cleviprex gtt   Home meds resumed  SBP goal <160   Long-term BP goal normotensive  Hyperlipidemia  Home meds:  crestor 10 + Zetia 10  LDL 31, goal < 70  Resumed home zetia  Discontinue statin given ICH in the setting of low LDL  Other Stroke Risk Factors  Advanced Age >/= 65   ETOH use, alcohol level <10, advised to drink no more than 2 drink(s) a day  Obesity, Body mass index is 30.22 kg/m., BMI >/= 30 associated with increased stroke risk, recommend weight loss, diet and exercise as appropriate   AAA  Other Active Problems  Chronic VBJ aneurysm, stable, followed by [facility]   CXR atx L mid lung  Hyperkalemia K 3.8->3.7->5.6 (hemolysis) ->3.5->3.7 - resolved  Hyponatremia Na 132->133->131->131  Cre 0.98->1.23->1.25   DISCHARGE EXAM Blood pressure (!) 148/75, pulse 83, temperature (!) 97.4 F (36.3 C), temperature source Oral, resp. rate 12, height 5' 10.5" (1.791 m), weight 96.9 kg, SpO2 97 %.   General - Well nourished, well developed, in no apparent distress.  Ophthalmologic - fundi not visualized due to noncooperation.  Cardiovascular - irregularly irregular heart rate and rhythm.  Neuro - awake alert, eyes open, orientated to self, people, place and month but not to year. Relatively fluent speech, able to name and repeat following all simple commands now. PERRL. Visual field full, no gaze palsy. Mild right facial droop. Tongue protrusion midline. Moving all extremities symmetrically and equally. FTN intact bilaterally. Symmetric sensation throughout. Gait not tested.   Discharge Diet      Diet   Diet Heart Room service appropriate? Yes; Fluid consistency: Thin   liquids  DISCHARGE  PLAN  Disposition:  Transfer to [place] for ongoing PT, OT and ST  No antithrombotic for secondary stroke prevention at this time due to [place]  Recommend ongoing stroke risk factor control by Primary Care Physician at time of discharge from inpatient rehabilitation.  Follow-up PCP Josetta Huddle, MD in 2 weeks following discharge from rehab.  Follow-up in [facility] with Dr. Leonie Man in 4 weeks following discharge from rehab, office to schedule an appointment.   35 minutes were spent preparing discharge.    Laurey Morale, MSN, NP-C [facility] [phone]   ATTENDING NOTE: I reviewed above note and agree with the assessment and plan. Pt was seen and examined.   Wife at the bedside. Pt was sleeping after work up with PT/OT. No acute event overnight. CIR has bed available today. Will transfer to CIR for further rehab.  Rosalin Hawking, MD PhD Stroke Neurology 05/03/2020 7:34 PM

## 2020-05-03 NOTE — Progress Notes (Signed)
Inpatient Rehabilitation Medication Review by a Pharmacist  A complete drug regimen review was completed for this patient to identify any potential clinically significant medication issues.  Clinically significant medication issues were identified:  no  Check AMION for pharmacist assigned to patient if future medication questions/issues arise during this admission.   Time spent performing this drug regimen review (minutes):  5  Noah Delaine, [place] Clinical Pharmacist  05/03/2020 9:06 PM

## 2020-05-03 NOTE — H&P (Signed)
Physical Medicine and Rehabilitation Admission H&P   CC: Thalamic hemorrhage  HPI: Steven Meadows. Steven Meadows is an 86 year old right-handed male with history of hypertension atrial fibrillation on Eliquis, severe neuropathy prior CVA, VA aneurysm who was admitted 04/30/2020 with acute onset of confusion and garbled speech.  Per chart review patient lives with spouse.  Reportedly independent prior to admission and active.  Two-level home bed and bath main level 2 steps to entry.  CT of the head showed a 6 mm hyperdense focus in the left thalamus reflecting small hemorrhage.  CT angiogram head and neck negative for intracranial large vessel occlusion.  Eliquis was reversed with Andexxa.  Neurology services felt small thalamic hemorrhage in setting of coagulopathy due to Eliquis maintained on low-dose Cleviprex for blood pressure management.  Follow-up MRI of the brain revealed progression of small vessel disease in the left thalamus and pons with late subacute left thalamic microhemorrhage and severe chronic small vessel disease.  Tolerating a regular consistency diet.  Physical medicine rehabilitation was consulted to assess candidacy for CIR given impaired cognition and mobility.  Patient was admitted for a comprehensive rehab program. Currently he denies headache or other complaints.   Review of Systems  Constitutional: Negative for chills and fever.  HENT: Negative for hearing loss.   Eyes: Negative for blurred vision and double vision.  Respiratory: Positive for cough.   Cardiovascular: Negative for chest pain, palpitations and leg swelling.  Gastrointestinal: Positive for constipation. Negative for heartburn, nausea and vomiting.  Genitourinary: Positive for urgency. Negative for dysuria and hematuria.  Musculoskeletal: Positive for back pain.  Skin: Negative for rash.  Neurological: Positive for sensory change and weakness.  Psychiatric/Behavioral: Positive for memory loss.  All other systems  reviewed and are negative.  Past Medical History:  Diagnosis Date  . Abdominal aortic atherosclerosis (Steven Meadows)   . Actinic keratoses   . 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 Atrial fibrillation (Steven Meadows)   . Cough   . History of mononucleosis   . HTN (hypertension)   . Hypercholesteremia   . Lacunar stroke (Steven Meadows)   . Low back pain   . Olecranon bursitis   . Rotator cuff tear   . Stroke [facility])    Past Surgical History:  Procedure Laterality Date  . CATARACT EXTRACTION--right eye  2013  . TONSILLECTOMY AND ADENOIDECTOMY     Family History  Problem Relation Age of Onset  . Heart attack Paternal Grandfather   . Heart attack Maternal Grandfather   . Cancer Paternal Grandmother   . Healthy Mother   . Transient ischemic attack Mother   . Healthy Father   . Healthy Sister    Social History:  reports that he has never smoked. He has never used smokeless tobacco. He reports current alcohol use. He reports that he does not use drugs. Allergies:  Allergies  Allergen Reactions  . Nabumetone Other (See Comments)    Gi upset  . Ace Inhibitors Cough  . Flonase [Fluticasone Propionate] Other (See Comments)    Causes nosebleeds   Medications Prior to Admission  Medication Sig Dispense Refill  . diltiazem (CARDIZEM CD) 240 MG 24 hr capsule Take 1 capsule (240 mg total) by mouth daily. 30 capsule 0  . ezetimibe (ZETIA) 10 MG tablet Take 10 mg by mouth daily.    . hydrALAZINE (APRESOLINE) 25 MG tablet Take 25 mg by mouth 2 (two) times daily.    . pantoprazole (PROTONIX) 40 MG tablet Take 1 tablet (40 mg  total) by mouth daily. 30 tablet 0  . senna-docusate (SENOKOT-S) 8.6-50 MG tablet Take 1 tablet by mouth 2 (two) times daily. 30 tablet 0  . triamterene-hydrochlorothiazide (MAXZIDE-25) 37.5-25 MG tablet Take 1 tablet by mouth daily.      Drug Regimen Review Drug regimen was reviewed and remains appropriate with no significant issues identified  Home: Home Living Family/patient expects  to be discharged to:: Private residence Living Arrangements: Spouse/significant other Available Help at Discharge: Family,Available 24 hours/day Type of Home: House Home Access: Stairs to enter [employer] of Steps: 2 Entrance Stairs-Rails: Right Home Layout: Two level,Able to live on main level with bedroom/bathroom Alternate Level Stairs-Number of Steps: flight Bathroom Shower/Tub: Multimedia programmer: Handicapped height Bathroom Accessibility: Yes Home Equipment: Grab bars - toilet,Grab bars - tub/shower,Cane - single point,Walker - 4 wheels,Walker - 2 wheels  Lives With: Spouse   Functional History: Prior Function Level of Independence: Independent Comments: gardens, grows Teacher, 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  Functional Status:  Mobility: Bed Mobility Overal bed mobility: Needs Assistance Bed Mobility: Supine to Sit Supine to sit: Min assist General bed mobility comments: Min A to steady trunk Transfers Overall transfer level: Needs assistance Equipment used: Rolling walker (2 wheeled) Transfers: Sit to/from Stand Sit to Stand: Min guard General transfer comment: Performed x 2; min guard to steady Ambulation/Gait Ambulation/Gait assistance: Min guard Gait Distance (Feet): 120 Feet Assistive device: Rolling walker (2 wheeled) Gait Pattern/deviations: Step-through pattern,Trunk flexed,Shuffle General Gait Details: Cues for posture and increased foot clearance.  Had pt identify items with ambulation and perform cognitive task.  Pt was also able to read room signs on L and R but did have to stop. Gait velocity: slow Gait velocity interpretation: <1.8 ft/sec, indicate of risk for recurrent falls  ADL: ADL Overall ADL's : Needs assistance/impaired Eating/Feeding: Set up,Sitting Grooming: Min guard,Sitting Upper Body Bathing: Minimal assistance,Sitting Lower Body Bathing: Moderate assistance,+2 for physical assistance,+2 for safety/equipment,Sit  to/from stand Upper Body Dressing : Minimal assistance,Sitting Lower Body Dressing: Maximal assistance,+2 for physical assistance,+2 for safety/equipment,Sit to/from stand Lower Body Dressing Details (indicate cue type and reason): requires assist to don socks seated EOB Toilet Transfer: Minimal assistance,+2 for physical assistance,+2 for safety/equipment,Ambulation Toilet Transfer Details (indicate cue type and reason): +2 HHA, simulated via transfer to recliner, around EOB to recliner placed on other side of bed Toileting- Clothing Manipulation and Hygiene: Maximal assistance,+2 for physical assistance,+2 for safety/equipment,Sit to/from stand Functional mobility during ADLs: +2 for physical assistance,+2 for safety/equipment,Moderate assistance (HHA)  Cognition: Cognition Overall Cognitive Status: Impaired/Different from baseline Orientation Level: Oriented X4 Cognition Arousal/Alertness: Awake/alert Behavior During Therapy: WFL for tasks assessed/performed Overall Cognitive Status: Impaired/Different from baseline Area of Impairment: Orientation,Memory,Following commands,Problem solving Orientation Level: Disoriented to,Place,Time,Situation (stated [place] and hospital when given choices. unable to state date despite max verbal cues, stated February of 2058) Memory: Decreased short-term memory Following Commands: Follows one step commands with increased time Problem Solving: Slow processing,Difficulty sequencing,Requires verbal cues,Requires tactile cues General Comments: Mild impairments.  Performed cognitive task with ambulation (naming foods that begin with letters of alphebet) - required increased time but was able to perform.  Pt able to carry on conversation regarding current events  Physical Exam: Blood pressure 125/77, pulse 77, temperature 98 F (36.7 C), temperature source Oral, resp. rate 18, height 5\' 11"  (1.803 m), weight 97.1 kg, SpO2 96 %. Physical Exam  Gen: no  distress, normal appearing HEENT: oral mucosa pink and moist, NCAT Cardio: Irregularly irregular rate  and rhythm Chest: normal effort, normal rate of breathing Abd: soft, non-distended Ext: no edema Skin: intact Neuro:Speech clear. Mild expressive deficits with delay in verbal output that improved as conversation progressed. Word finding deficits noted and he had difficulty recalling his children's ages.  He was able to follow simple motor commands without difficulty. Sensory deficits bilateral feet.  5/5 strength throughout Psych: Very friendly and cooperative.   Results for orders placed or performed during the hospital encounter of 04/30/20 (from the past 48 hour(s))  CBC     Status: None   Collection Time: 05/02/20  3:20 AM  Result Value Ref Range   WBC 8.3 4.0 - 10.5 K/uL   RBC 5.59 4.22 - 5.81 MIL/uL   Hemoglobin 16.3 13.0 - 17.0 g/dL   HCT 46.9 39.0 - 52.0 %   MCV 83.9 80.0 - 100.0 fL   MCH 29.2 26.0 - 34.0 pg   MCHC 34.8 30.0 - 36.0 g/dL   RDW 13.3 11.5 - 15.5 %   Platelets 178 150 - 400 K/uL   nRBC 0.0 0.0 - 0.2 %    Comment: Performed at [facility], [place] [address] [place], [place] [id number]  Basic metabolic panel     Status: Abnormal   Collection Time: 05/02/20  3:20 AM  Result Value Ref Range   Sodium 131 (L) 135 - 145 mmol/L   Potassium 3.5 3.5 - 5.1 mmol/L   Chloride 94 (L) 98 - 111 mmol/L   CO2 25 22 - 32 mmol/L   Glucose, Bld 112 (H) 70 - 99 mg/dL    Comment: Glucose reference range applies only to samples taken after fasting for at least 8 hours.   BUN 14 8 - 23 mg/dL   Creatinine, Ser 1.23 0.61 - 1.24 mg/dL   Calcium 9.7 8.9 - 10.3 mg/dL   GFR, Estimated 57 (L) >60 mL/min    Comment: (NOTE) Calculated using the CKD-EPI Creatinine Equation (2021)    Anion gap 12 5 - 15    Comment: Performed at [facility] [address] [place], [place] [address]  Glucose, capillary     Status: Abnormal   Collection Time: 05/02/20  9:14 PM  Result Value  Ref Range   Glucose-Capillary 144 (H) 70 - 99 mg/dL    Comment: Glucose reference range applies only to samples taken after fasting for at least 8 hours.   Comment 1 Notify RN    Comment 2 Document in Chart   CBC     Status: None   Collection Time: 05/03/20  3:12 AM  Result Value Ref Range   WBC 8.4 4.0 - 10.5 K/uL   RBC 5.45 4.22 - 5.81 MIL/uL   Hemoglobin 16.1 13.0 - 17.0 g/dL   HCT 45.4 39.0 - 52.0 %   MCV 83.3 80.0 - 100.0 fL   MCH 29.5 26.0 - 34.0 pg   MCHC 35.5 30.0 - 36.0 g/dL   RDW 13.3 11.5 - 15.5 %   Platelets 181 150 - 400 K/uL   nRBC 0.0 0.0 - 0.2 %    Comment: Performed at [facility], [place] [address], [place], [place] [id number]  Basic metabolic panel     Status: Abnormal   Collection Time: 05/03/20  3:12 AM  Result Value Ref Range   Sodium 131 (L) 135 - 145 mmol/L   Potassium 3.7 3.5 - 5.1 mmol/L   Chloride 96 (L) 98 - 111 mmol/L   CO2 22 22 -  32 mmol/L   Glucose, Bld 108 (H) 70 - 99 mg/dL    Comment: Glucose reference range applies only to samples taken after fasting for at least 8 hours.   BUN 19 8 - 23 mg/dL   Creatinine, Ser 4.09 (H) 0.61 - 1.24 mg/dL   Calcium 9.6 8.9 - 81.1 mg/dL   GFR, Estimated 56 (L) >60 mL/min    Comment: (NOTE) Calculated using the CKD-EPI Creatinine Equation (2021)    Anion gap 13 5 - 15    Comment: Performed at [facility], [address] [place], [place] [address]  Glucose, capillary     Status: Abnormal   Collection Time: 05/03/20  6:15 AM  Result Value Ref Range   Glucose-Capillary 116 (H) 70 - 99 mg/dL    Comment: Glucose reference range applies only to samples taken after fasting for at least 8 hours.   Comment 1 Notify RN    Comment 2 Document in Chart   Glucose, capillary     Status: Abnormal   Collection Time: 05/03/20 11:19 AM  Result Value Ref Range   Glucose-Capillary 107 (H) 70 - 99 mg/dL    Comment: Glucose reference range applies only to samples taken after fasting for at least 8 hours.  Glucose,  capillary     Status: Abnormal   Collection Time: 05/03/20  3:49 PM  Result Value Ref Range   Glucose-Capillary 114 (H) 70 - 99 mg/dL    Comment: Glucose reference range applies only to samples taken after fasting for at least 8 hours.   CT HEAD WO CONTRAST  Result Date: 05/02/2020 CLINICAL DATA:  86 year old male code stroke presentation on 04/30/2020, with suspected subacute left thalamic microhemorrhage. EXAM: CT HEAD WITHOUT CONTRAST TECHNIQUE: Contiguous axial images were obtained from the base of the skull through the vertex without intravenous contrast. COMPARISON:  Brain MRI 05/01/2020 and earlier. FINDINGS: Brain: Dorsal left thalamic hyperdense hemorrhage measuring 4 mm has faded since 04/30/2020 (series 3, image 20). No associated edema or mass effect. No intraventricular or extra-axial extension. Underlying advanced chronic small vessel disease with confluent hypodensity in the bilateral cerebral white matter and extensive deep gray nuclei and pons heterogeneity. No midline shift, ventriculomegaly, mass effect, evidence of mass lesion, new intracranial hemorrhage or evidence of cortically based acute infarction. Vascular: Calcified atherosclerosis at the skull base. Skull: No acute osseous abnormality identified. Sinuses/Orbits: Visualized paranasal sinuses and mastoids are stable and well pneumatized. Other: Visualized orbits and scalp soft tissues are within normal limits. IMPRESSION: 1. Partially regressed punctate left thalamic hemorrhage since 04/30/2020. No complicating features. 2. Advanced chronic small vessel disease. No new intracranial abnormality. Electronically Signed   By: Odessa Fleming M.D.   On: 05/02/2020 04:15       Medical Problem List and Plan: 1.  Altered mental status with dysarthric speech secondary to small left thalamic hemorrhage in setting of Eliquis coagulopathy  -patient may shower  -ELOS/Goals: 10-14 days S 2.  Antithrombotics: -DVT/anticoagulation:  SCDs  -antiplatelet therapy: N/A 3. Pain Management: Tylenol as needed 4. Mood: Provide emotional support  -antipsychotic agents: N/A 5. Neuropsych: This patient he is capable of making decisions on his own behalf. 6. Skin/Wound Care: Routine skin checks 7. Fluids/Electrolytes/Nutrition: Electrolytes stable 12/30, repeat tomorrow 8.  Hypertension.  Well controlled, continue Cardizem 240 mg daily, hydralazine 25 mg twice daily, Maxide 1 tablet daily.  Monitor with increased mobility 9.  Atrial fibrillation.  Continue Cardizem.  Cardiac rate controlled.  Eliquis discontinued due to  thalamic hemorrhage 10.  Hyperlipidemia.  Zetia  I have personally performed a face to face diagnostic evaluation, including, but not limited to relevant history and physical exam findings, of this patient and developed relevant assessment and plan.  Additionally, I have reviewed and concur with the physician assistant's documentation above.  Izora Ribas, MD 05/03/2020   Lavon Paganini Angiulli, PA-C

## 2020-05-03 NOTE — Progress Notes (Signed)
Cristina Gong, RN  Rehab Admission Coordinator  Physical Medicine and Rehabilitation  PMR Pre-admission     Signed  Date of Service:  05/02/2020  5:49 PM      Related encounter: ED to Hosp-Admission (Current) from 04/30/2020 in [facility]       Signed          Show:Clear all [x] Manual[x] Template[x] Copied  Added by: [x] Julious Payer Vertis Kelch, RN   [] Hover for details  PMR Admission Coordinator Pre-Admission Assessment   Patient: Steven Meadows is an 86 y.o., male MRN: [id number] DOB: 08/22/1931 Height: 5' 10.5" (179.1 cm) (From Dr. Gabriel Carina today) Weight: 96.9 kg (weight from Dr. office to today)                                                                                                                                                  Insurance Information HMO:     PPO:      PCP:      IPA:      80/20:      OTHER:  PRIMARY: Medicare a and b      Policy#: [id number]      Subscriber: pt Benefits:  Phone #: passport one online     Name: 12/29 Eff. Date: a 12/04/1998 and b 10/04/2015     Deduct: $1484      Out of Pocket Max: none      Life Max: none  CIR: 100%      SNF: 20 full days Outpatient: 80%     Co-Pay: 20% Home Health: 100%      Co-Pay: none DME: 80%     Co-Pay: 20% Providers: pt choice  SECONDARY: BCBS supplement      Policy#: [id number]        Financial Counselor:       Phone#:    The Therapist, art Information Summary" for patients in Inpatient Rehabilitation Facilities with attached "Privacy Act [place] Records" was provided and verbally reviewed with: Patient and Family   Emergency Contact Information         Contact Information     Name Relation Home Work Steven Meadows Spouse [phone]   [phone]    page, beth Daughter     [phone]       Current Medical History  Patient Admitting Diagnosis: ICH   History of Present Illness:  86 year old right-handed male with history of hypertension  atrial fibrillation on Eliquis, severe neuropathy prior CVA, VA aneurysm who was admitted 04/30/2020 with acute onset of confusion and garbled speech.     CT of the head showed a 6 mm hyperdense focus in the left thalamus reflecting small hemorrhage.  CT angiogram head and neck negative for intracranial large vessel occlusion.  Eliquis was reversed with Andexxa.  Neurology services felt small thalamic hemorrhage  in setting of coagulopathy due to Eliquis maintained on low-dose Cleviprex for blood pressure management.  Follow-up MRI of the brain revealed progression of small vessel disease in the left thalamus and pons with late subacute left thalamic micro hemorrhage and severe chronic small vessel disease.  Tolerating a regular consistency diet.   Complete NIHSS TOTAL: 2 Glasgow Coma Scale Score: 15   Past Medical History      Past Medical History:  Diagnosis Date  . Abdominal aortic atherosclerosis ([place])    . Actinic keratoses    . 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 Atrial fibrillation ([place])    . Cough    . History of mononucleosis    . HTN (hypertension)    . Hypercholesteremia    . Lacunar stroke ([place])    . Low back pain    . Olecranon bursitis    . Rotator cuff tear    . Stroke [facility])        Family History  family history includes Cancer in his paternal grandmother; Healthy in his father, mother, and sister; Heart attack in his maternal grandfather and paternal grandfather; Transient ischemic attack in his mother.   Prior Rehab/Hospitalizations:  Has the patient had prior rehab or hospitalizations prior to admission? Yes   Has the patient had major surgery during 100 days prior to admission? No   Current Medications    Current Facility-Administered Medications:  .  acetaminophen (TYLENOL) tablet 650 mg, 650 mg, Oral, Q4H PRN, 650 mg at 05/03/20 1002 **OR** acetaminophen (TYLENOL) 160 MG/5ML solution 650 mg, 650 mg, Per Tube, Q4H PRN **OR** acetaminophen (TYLENOL)  suppository 650 mg, 650 mg, Rectal, Q4H PRN, Kerney Elbe, MD .  chlorhexidine gluconate (MEDLINE KIT) (PERIDEX) 0.12 % solution 15 mL, 15 mL, Mouth Rinse, QHS, Kerney Elbe, MD, 15 mL at 04/30/20 2142 .  Chlorhexidine Gluconate Cloth 2 % PADS 6 each, 6 each, Topical, Daily, Amie Portland, MD, 6 each at 05/03/20 1003 .  diltiazem (CARDIZEM CD) 24 hr capsule 240 mg, 240 mg, Oral, Daily, Kerney Elbe, MD, 240 mg at 05/03/20 1002 .  ezetimibe (ZETIA) tablet 10 mg, 10 mg, Oral, Daily, Kerney Elbe, MD, 10 mg at 05/03/20 1002 .  hydrALAZINE (APRESOLINE) tablet 25 mg, 25 mg, Oral, BID, Kerney Elbe, MD, 25 mg at 05/03/20 1002 .  labetalol (NORMODYNE) injection 5-20 mg, 5-20 mg, Intravenous, Q2H PRN, Rosalin Hawking, MD .  pantoprazole (PROTONIX) EC tablet 40 mg, 40 mg, Oral, Daily, Rosalin Hawking, MD, 40 mg at 05/03/20 1002 .  senna-docusate (Senokot-S) tablet 1 tablet, 1 tablet, Oral, BID, Kerney Elbe, MD, 1 tablet at 05/03/20 1003 .  triamterene-hydrochlorothiazide (MAXZIDE-25) 37.5-25 MG per tablet 1 tablet, 1 tablet, Oral, Daily, Kerney Elbe, MD, 1 tablet at 05/03/20 1002   Patients Current Diet:     Diet Order                      Diet Heart Room service appropriate? 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  Independent   Indoor Mobility: Did the patient need assistance with walking  from room to room (with or without device)? Independent   Stairs: Did the patient need assistance with internal or external stairs (with or without device)? Independent   Functional Cognition: Did the patient need help planning regular tasks such as shopping or remembering to take medications? Independent   Home Assistive Devices / Equipment Home Assistive Devices/Equipment: None Home Equipment: Grab bars - toilet,Grab bars - tub/shower,Cane - single point,Walker - 4 wheels,Walker - 2 wheels   Prior Device Use: Indicate devices/aids used by the patient prior to current illness, exacerbation or injury? Walker   Current Functional Level Cognition   Overall Cognitive Status: Impaired/Different from baseline Orientation Level: Oriented X4 Following Commands: Follows one step commands with increased time General Comments: Mild impairments.  Performed cognitive task with ambulation (naming foods that begin with letters of alphebet) - required increased time but was able to perform.  Pt able to carry on conversation regarding current events    Extremity Assessment (includes Sensation/Coordination)   Upper Extremity Assessment: Generalized weakness  Lower Extremity Assessment: Defer to PT evaluation     ADLs   Overall ADL's : Needs assistance/impaired Eating/Feeding: Set up,Sitting Grooming: Min guard,Sitting Upper Body Bathing: Minimal assistance,Sitting Lower Body Bathing: Moderate assistance,+2 for physical assistance,+2 for safety/equipment,Sit to/from stand Upper Body Dressing : Minimal assistance,Sitting Lower Body Dressing: Maximal assistance,+2 for physical assistance,+2 for safety/equipment,Sit to/from stand Lower Body Dressing Details (indicate cue type and reason): requires assist to don socks seated EOB Toilet Transfer: Minimal assistance,+2 for physical assistance,+2 for safety/equipment,Ambulation Toilet Transfer Details (indicate cue type and reason): +2 HHA, simulated via  transfer to recliner, around EOB to recliner placed on other side of bed Toileting- Clothing Manipulation and Hygiene: Maximal assistance,+2 for physical assistance,+2 for safety/equipment,Sit to/from stand Functional mobility during ADLs: +2 for physical assistance,+2 for safety/equipment,Moderate assistance (HHA)     Mobility   Overal bed mobility: Needs Assistance Bed Mobility: Supine to Sit Supine to sit: Min assist General bed mobility comments: Min A to steady trunk     Transfers   Overall transfer level: Needs assistance Equipment used: Rolling walker (2 wheeled) Transfers: Sit to/from Stand Sit to Stand: Min guard General transfer comment: Performed x 2; min guard to steady     Ambulation / Gait / Stairs / Emergency planning/management officer   Ambulation/Gait Ambulation/Gait assistance: Counsellor (Feet): 120 Feet Assistive device: Rolling walker (2 wheeled) Gait Pattern/deviations: Step-through pattern,Trunk flexed,Shuffle General Gait Details: Cues for posture and increased foot clearance.  Had pt identify items with ambulation and perform cognitive task.  Pt was also able to read room signs on L and R but did have to stop. Gait velocity: slow Gait velocity interpretation: <1.8 ft/sec, indicate of risk for recurrent falls     Posture / Balance Balance Overall balance assessment: Needs assistance Sitting-balance support: Feet supported,No upper extremity supported Sitting balance-Leahy Scale: Good Standing balance support: Bilateral upper extremity supported,During functional activity Standing balance-Leahy Scale: Poor Standing balance comment: Required use of UE but was steady with RW     Special needs/care consideration Hgb A1c 6.0    Previous Home Environment  Living Arrangements: Spouse/significant other  Lives With: Spouse Available Help at Discharge: Family,Available 24 hours/day Type  of Home: House Home Layout: Two level,Able to live on main level with  bedroom/bathroom Alternate Level Stairs-Number of Steps: flight Home Access: Stairs to enter Entrance Stairs-Rails: Right Entrance Stairs-Number of Steps: 2 Bathroom Shower/Tub: Multimedia programmer: Handicapped height Bathroom Accessibility: Yes How Accessible: Accessible via walker [place]: No   Discharge Living Setting Plans for Discharge Living Setting: Patient's home,Lives with (comment) (wife) Type of Home at Discharge: House Discharge Home Layout: Two level,Able to live on main level with bedroom/bathroom Alternate Level Stairs-Number of Steps: flight Discharge Home Access: Stairs to enter Entrance Stairs-Rails: Right Entrance Stairs-Number of Steps: 2 Discharge Bathroom Shower/Tub: Walk-in shower Discharge Bathroom Toilet: Handicapped height Discharge Bathroom Accessibility: Yes How Accessible: Accessible via walker Does the patient have any problems obtaining your medications?: No   Social/Family/Support Systems Contact Information: wife, Steven Meadows Anticipated Caregiver: wife Anticipated Ambulance person Information: see above Ability/Limitations of Caregiver: none Caregiver Availability: 24/7 Discharge Plan Discussed with Primary Caregiver: Yes Is Caregiver In Agreement with Plan?: Yes Does Caregiver/Family have Issues with Lodging/Transportation while Pt is in Rehab?: No   Goals Patient/Family Goal for Rehab: Mod I ot supervision with PT, OT, and SLP Expected length of stay: ELOS 7 to 10 days Pt/Family Agrees to Admission and willing to participate: Yes Program Orientation Provided & Reviewed with Pt/Caregiver Including Roles  & Responsibilities: Yes   Decrease burden of Care through IP rehab admission: n/a   Possible need for SNF placement upon discharge: Not anticipated   Patient Condition: This patient's condition remains as documented in the consult dated 05/01/2020, in which the Rehabilitation Physician determined and documented  that the patient's condition is appropriate for intensive rehabilitative care in an inpatient rehabilitation facility. Will admit to inpatient rehab today.   Preadmission Screen Completed By:  Cleatrice Burke, RN, 05/03/2020 10:35 AM ______________________________________________________________________   Discussed status with Dr. Ranell Patrick on 05/03/2020 at  1035 and received approval for admission today.   Admission Coordinator:  Cleatrice Burke, time 2778 Date 05/03/2020             Cosigned by: Izora Ribas, MD at 05/03/2020 10:44 AM    Revision History                                  Note Details  Author Cristina Gong, RN File Time 05/03/2020 10:35 AM  Author Type Rehab Admission Coordinator Status Signed  Last Editor Cristina Gong, RN Service Physical Medicine and Rehabilitation

## 2020-05-03 NOTE — TOC Transition Note (Signed)
Transition of Care [facility]) - CM/SW Discharge Note   Patient Details  Name: Steven Meadows MRN: [id number] Date of Birth: [birth year]  [facility] CM/SW Contact:  Kermit Balo, RN Phone Number: 05/03/2020, 10:14 AM   Clinical Narrative:    Pt is discharging to CIR today. CM signing off.    Final next level of care: IP Rehab Facility Barriers to Discharge: No Barriers Identified   Patient Goals and CMS Choice        Discharge Placement                       Discharge Plan and Services                                     Social Determinants of Health (SDOH) Interventions     Readmission Risk Interventions Readmission Risk Prevention Plan 09/05/2018  Post Dischage Appt Complete  Medication Screening Complete  Transportation Screening Complete  Some recent data might be hidden

## 2020-05-03 NOTE — Progress Notes (Signed)
Patient ID: Steven Meadows, male   DOB: March 30, 1932, 86 y.o.   MRN: [id number] Admit to unit, reviewed medications, therapy schedule and plan of care. Pamelia Hoit

## 2020-05-03 NOTE — Progress Notes (Signed)
Izora Ribas, MD  Physician  Physical Medicine and Rehabilitation  Consult Note     Signed  Date of Service:  05/01/2020  1:05 PM      Related encounter: ED to Hosp-Admission (Current) from 04/30/2020 in [place] [facility]       Signed      Expand All Collapse All     Show:Clear all [x] Manual[x] Template[] Copied  Added by: [x] Love, Ivan Anchors, PA-C[x] Raulkar, Clide Deutscher, MD   [] Hover for details           Physical Medicine and Rehabilitation Consult     Reason for Consult: [place] with functional deficits.  Referring Physician: Dr. Erlinda Hong     HPI: Steven Meadows is a 86 y.o.  RH- male with history of HTN, A fib- on Eliquis , severe neuropathy, prior CVA, VA aneurysm who was admitted on 04/30/20 with acute onset of confusion with garbled speech. CT head showed 6 mm hyperdense sign in left thalamus suspicious for hemorrhage. Eliquis reversed with Andexxa. CTA head/neck was negative for LVO with unchanged 6 mm R-VB aneurysm.  Dr. Erlinda Hong felt that small thalamic hemorrhage in setting of coagulopathy due to Eliquis and on low dose Cleverpex for BP management. Follow up MRI brain done revealing progression of small vessel disease in left thalamus and pons w/ late subacute left thalamic microhemorrhage and severe chronic small vessel disease. PT evaluation done revealing balance deficits with weakness affecting mobility. Physical Medicine & Rehabilitation was consulted to assess candidacy for CIR given impaired cognition and mobility. Patient and wife at bedside agreeable to CIR.     Review of Systems  Constitutional: Negative for chills and fever.  HENT: Negative for hearing loss and tinnitus.   Eyes: Negative for blurred vision and double vision.  Respiratory: Negative for cough and hemoptysis.   Cardiovascular: Negative for chest pain and palpitations.  Gastrointestinal: Negative for heartburn and nausea.  Genitourinary: Negative for dysuria and urgency.   Musculoskeletal: Negative for back pain and myalgias.  Neurological: Positive for sensory change (feet are numb due to neuropathy) and weakness. Negative for dizziness and headaches.  Psychiatric/Behavioral: Positive for memory loss.          Past Medical History:  Diagnosis Date  . Abdominal aortic atherosclerosis ([place])    . Actinic keratoses    . 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 Atrial fibrillation ([place])    . Cough    . History of mononucleosis    . HTN (hypertension)    . Hypercholesteremia    . Lacunar stroke ([place])    . Low back pain    . Olecranon bursitis    . Rotator cuff tear    . Stroke [facility])             Past Surgical History:  Procedure Laterality Date  . CATARACT EXTRACTION--right eye   2013  . TONSILLECTOMY AND ADENOIDECTOMY               Family History  Problem Relation Age of Onset  . Heart attack Paternal Grandfather    . Heart attack Maternal Grandfather    . Cancer Paternal Grandmother    . Healthy Mother    . Transient ischemic attack Mother    . Healthy Father    . Healthy Sister        Social History:  Married. Retired but independent [employer] and works in yard daily for 3 hours. He uses walker out of home. He  reports that he has never smoked. He has never used smokeless tobacco. He reports current alcohol use. He reports that he does not use drugs.           Allergies  Allergen Reactions  . Nabumetone Other (See Comments)      Gi upset  . Ace Inhibitors Cough  . Flonase [Fluticasone Propionate] Other (See Comments)      Causes nosebleeds            Medications Prior to Admission  Medication Sig Dispense Refill  . chlorhexidine (PERIDEX) 0.12 % solution 15 mLs by Mouth Rinse route at bedtime.      Marland Kitchen diltiazem (CARDIZEM CD) 240 MG 24 hr capsule Take 1 capsule (240 mg total) by mouth daily. 30 capsule 0  . ezetimibe (ZETIA) 10 MG tablet Take 10 mg by mouth daily.      . hydrALAZINE (APRESOLINE) 25 MG tablet Take 25 mg by  mouth 2 (two) times daily.      Marland Kitchen omeprazole (PRILOSEC) 40 MG capsule Take 40 mg by mouth daily before breakfast.      . PRESCRIPTION MEDICATION CPAP- At bedtime      . rosuvastatin (CRESTOR) 10 MG tablet Take 1 tablet (10 mg total) by mouth daily at 6 PM. 30 tablet 0  . triamterene-hydrochlorothiazide (MAXZIDE-25) 37.5-25 MG tablet Take 1 tablet by mouth daily.      . hydrocortisone 2.5 % cream Apply 1 application topically as directed.          Home: Home Living Family/patient expects to be discharged to:: Private residence Living Arrangements: Spouse/significant other Available Help at Discharge: Family,Available 24 hours/day Type of Home: House Home Access: Stairs to enter [employer] of Steps: 2 Entrance Stairs-Rails: Right Home Layout: Two level,Able to live on main level with bedroom/bathroom Alternate Level Stairs-Number of Steps: flight (doesnt ever go upstairs) Bathroom Shower/Tub: Multimedia programmer: Handicapped height Home Equipment: Grab bars - toilet,Grab bars - tub/shower,Cane - single point,Walker - 4 wheels,Walker - 2 wheels  Functional History: Prior Function Level of Independence: Independent Comments: gardens, grows Teacher, 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 Functional Status:  Mobility: Bed Mobility Overal bed mobility: Needs Assistance Bed Mobility: Supine to Sit Supine to sit: Min assist General bed mobility comments: max verbal and directional cues to complete transfer due to delayed processing, minA for trunk elevation, tactile cues to scoot forward to place feet on the floor Transfers Overall transfer level: Needs assistance Equipment used: 2 person hand held assist Transfers: Sit to/from Stand Sit to Stand: Mod assist,+2 physical assistance,+2 safety/equipment General transfer comment: modA to power up, wide base of support, increased time, signficant trunk flexion Ambulation/Gait Ambulation/Gait assistance: Mod assist,+2 physical  assistance,Min assist Gait Distance (Feet): 12 Feet (from 1 side of the bed to the other) Assistive device: 2 person hand held assist Gait Pattern/deviations: Step-through pattern,Decreased stride length,Shuffle General Gait Details: pt with significant grip on PT and OTs hands, short shuffled steps, guarded Gait velocity: slow Gait velocity interpretation: <1.8 ft/sec, indicate of risk for recurrent falls   ADL:   Cognition: Cognition Overall Cognitive Status: Impaired/Different from baseline Orientation Level: Oriented X4 Cognition Arousal/Alertness: Awake/alert Behavior During Therapy: WFL for tasks assessed/performed Overall Cognitive Status: Impaired/Different from baseline Area of Impairment: Orientation,Memory,Following commands,Problem solving Orientation Level: Disoriented to,Place,Time,Situation (stated [place] and hospital when given choices. unable to state date despite max verbal cues, stated February of 2058) Memory: Decreased short-term memory Following Commands: Follows one step commands with increased  time Problem Solving: Slow processing,Difficulty sequencing,Requires verbal cues,Requires tactile cues General Comments: pt with increased processing time, word finding difficulty, 75% accuracy with object identification when given increased time     Blood pressure 124/74, pulse 86, temperature 97.8 F (36.6 C), temperature source Oral, resp. rate 18, height 5' 10.5" (1.791 m), weight 96.9 kg, SpO2 95 %. Physical Exam General: Alert, No apparent distress HEENT: Head is normocephalic, atraumatic, PERRLA, EOMI, sclera anicteric, oral mucosa pink and moist, dentition intact, ext ear canals clear,  Neck: Supple without JVD or lymphadenopathy Heart: Reg rate and rhythm. No murmurs rubs or gallops Chest: CTA bilaterally without wheezes, rales, or rhonchi; no distress Abdomen: Soft, non-tender, non-distended, bowel sounds positive. Extremities: No clubbing, cyanosis, or  edema. Pulses are 2+ Skin: Clean and intact without signs of breakdown Neuro: Speech clear. Mild expressive deficits with delay in verbal output that improved as conversation progressed. Word finding deficits noted and he had difficulty recalling his children's ages.  He was able to follow simple motor commands without difficulty. Sensory deficits bilateral feet.  Moving all extremities without difficulty Psych: Pt's affect is appropriate. Pt is cooperative    Lab Results Last 24 Hours       Results for orders placed or performed during the hospital encounter of 04/30/20 (from the past 24 hour(s))  Ethanol     Status: None    Collection Time: 04/30/20  4:02 PM  Result Value Ref Range    Alcohol, Ethyl (B) <10 <10 mg/dL  Protime-INR     Status: None    Collection Time: 04/30/20  4:02 PM  Result Value Ref Range    Prothrombin Time 14.5 11.4 - 15.2 seconds    INR 1.2 0.8 - 1.2  APTT     Status: Abnormal    Collection Time: 04/30/20  4:02 PM  Result Value Ref Range    aPTT 39 (H) 24 - 36 seconds  CBC     Status: None    Collection Time: 04/30/20  4:02 PM  Result Value Ref Range    WBC 7.3 4.0 - 10.5 K/uL    RBC 5.43 4.22 - 5.81 MIL/uL    Hemoglobin 15.7 13.0 - 17.0 g/dL    HCT 47.4 39.0 - 52.0 %    MCV 87.3 80.0 - 100.0 fL    MCH 28.9 26.0 - 34.0 pg    MCHC 33.1 30.0 - 36.0 g/dL    RDW 13.2 11.5 - 15.5 %    Platelets 182 150 - 400 K/uL    nRBC 0.0 0.0 - 0.2 %  Differential     Status: Abnormal    Collection Time: 04/30/20  4:02 PM  Result Value Ref Range    Neutrophils Relative % 60 %    Neutro Abs 4.4 1.7 - 7.7 K/uL    Lymphocytes Relative 21 %    Lymphs Abs 1.6 0.7 - 4.0 K/uL    Monocytes Relative 16 %    Monocytes Absolute 1.2 (H) 0.1 - 1.0 K/uL    Eosinophils Relative 1 %    Eosinophils Absolute 0.1 0.0 - 0.5 K/uL    Basophils Relative 1 %    Basophils Absolute 0.0 0.0 - 0.1 K/uL    Immature Granulocytes 1 %    Abs Immature Granulocytes 0.04 0.00 - 0.07 K/uL   Comprehensive metabolic panel     Status: Abnormal    Collection Time: 04/30/20  4:02 PM  Result Value Ref Range    Sodium 132 (L)  135 - 145 mmol/L    Potassium 3.8 3.5 - 5.1 mmol/L    Chloride 96 (L) 98 - 111 mmol/L    CO2 25 22 - 32 mmol/L    Glucose, Bld 108 (H) 70 - 99 mg/dL    BUN 14 8 - 23 mg/dL    Creatinine, Ser 1.11 0.61 - 1.24 mg/dL    Calcium 9.8 8.9 - 10.3 mg/dL    Total Protein 6.7 6.5 - 8.1 g/dL    Albumin 4.2 3.5 - 5.0 g/dL    AST 26 15 - 41 U/L    ALT 25 0 - 44 U/L    Alkaline Phosphatase 28 (L) 38 - 126 U/L    Total Bilirubin 1.2 0.3 - 1.2 mg/dL    GFR, Estimated >60 >60 mL/min    Anion gap 11 5 - 15  Urine rapid drug screen (hosp performed)     Status: None    Collection Time: 04/30/20  4:02 PM  Result Value Ref Range    Opiates NONE DETECTED NONE DETECTED    Cocaine NONE DETECTED NONE DETECTED    Benzodiazepines NONE DETECTED NONE DETECTED    Amphetamines NONE DETECTED NONE DETECTED    Tetrahydrocannabinol NONE DETECTED NONE DETECTED    Barbiturates NONE DETECTED NONE DETECTED  Urinalysis, Routine w reflex microscopic     Status: None    Collection Time: 04/30/20  4:02 PM  Result Value Ref Range    Color, Urine YELLOW YELLOW    APPearance CLEAR CLEAR    Specific Gravity, Urine 1.023 1.005 - 1.030    pH 6.0 5.0 - 8.0    Glucose, UA NEGATIVE NEGATIVE mg/dL    Hgb urine dipstick NEGATIVE NEGATIVE    Bilirubin Urine NEGATIVE NEGATIVE    Ketones, ur NEGATIVE NEGATIVE mg/dL    Protein, ur NEGATIVE NEGATIVE mg/dL    Nitrite NEGATIVE NEGATIVE    Leukocytes,Ua NEGATIVE NEGATIVE  SARS CORONAVIRUS 2 (TAT 6-24 HRS) Nasopharyngeal     Status: None    Collection Time: 04/30/20  4:03 PM    Specimen: Nasopharyngeal  Result Value Ref Range    SARS Coronavirus 2 NEGATIVE NEGATIVE  I-stat chem 8, ED     Status: Abnormal    Collection Time: 04/30/20  4:13 PM  Result Value Ref Range    Sodium 133 (L) 135 - 145 mmol/L    Potassium 3.7 3.5 - 5.1 mmol/L    Chloride 96  (L) 98 - 111 mmol/L    BUN 17 8 - 23 mg/dL    Creatinine, Ser 1.00 0.61 - 1.24 mg/dL    Glucose, Bld 106 (H) 70 - 99 mg/dL    Calcium, Ion 1.14 (L) 1.15 - 1.40 mmol/L    TCO2 24 22 - 32 mmol/L    Hemoglobin 16.3 13.0 - 17.0 g/dL    HCT 48.0 39.0 - 52.0 %  MRSA PCR Screening     Status: None    Collection Time: 04/30/20  8:11 PM    Specimen: Nasal Mucosa; Nasopharyngeal  Result Value Ref Range    MRSA by PCR NEGATIVE NEGATIVE  CBC     Status: None    Collection Time: 05/01/20  7:07 AM  Result Value Ref Range    WBC 9.0 4.0 - 10.5 K/uL    RBC 5.69 4.22 - 5.81 MIL/uL    Hemoglobin 16.9 13.0 - 17.0 g/dL    HCT 48.3 39.0 - 52.0 %    MCV 84.9 80.0 - 100.0 fL  MCH 29.7 26.0 - 34.0 pg    MCHC 35.0 30.0 - 36.0 g/dL    RDW 13.2 11.5 - 15.5 %    Platelets 197 150 - 400 K/uL    nRBC 0.0 0.0 - 0.2 %  Basic metabolic panel     Status: Abnormal    Collection Time: 05/01/20  7:07 AM  Result Value Ref Range    Sodium 131 (L) 135 - 145 mmol/L    Potassium 5.6 (H) 3.5 - 5.1 mmol/L    Chloride 93 (L) 98 - 111 mmol/L    CO2 25 22 - 32 mmol/L    Glucose, Bld 103 (H) 70 - 99 mg/dL    BUN 11 8 - 23 mg/dL    Creatinine, Ser 0.98 0.61 - 1.24 mg/dL    Calcium 9.9 8.9 - 10.3 mg/dL    GFR, Estimated >60 >60 mL/min    Anion gap 13 5 - 15  Hemoglobin A1c     Status: Abnormal    Collection Time: 05/01/20  7:07 AM  Result Value Ref Range    Hgb A1c MFr Bld 6.0 (H) 4.8 - 5.6 %    Mean Plasma Glucose 125.5 mg/dL  Lipid panel     Status: Abnormal    Collection Time: 05/01/20  7:07 AM  Result Value Ref Range    Cholesterol 126 0 - 200 mg/dL    Triglycerides 259 (H) <150 mg/dL    HDL 43 >40 mg/dL    Total CHOL/HDL Ratio 2.9 RATIO    VLDL 52 (H) 0 - 40 mg/dL    LDL Cholesterol 31 0 - 99 mg/dL       Imaging Results (Last 48 hours)  CT Angio Head W or Wo Contrast   Result Date: 04/30/2020 CLINICAL DATA:  Acute neuro deficit.  Aphasia. EXAM: CT ANGIOGRAPHY HEAD AND NECK TECHNIQUE: Multidetector  CT imaging of the head and neck was performed using the standard protocol during bolus administration of intravenous contrast. Multiplanar CT image reconstructions and MIPs were obtained to evaluate the vascular anatomy. Carotid stenosis measurements (when applicable) are obtained utilizing NASCET criteria, using the distal internal carotid diameter as the denominator. CONTRAST:  16mL OMNIPAQUE IOHEXOL 350 MG/ML SOLN COMPARISON:  CT head 04/30/2020.  CT angio head and neck 09/03/2018 FINDINGS: CTA NECK FINDINGS Aortic arch: Mild atherosclerotic calcification aortic arch. Proximal great vessels tortuous but widely patent. Right carotid system: Atherosclerotic calcification right carotid bifurcation without significant stenosis. Left carotid system: Atherosclerotic calcification left carotid bifurcation without stenosis Vertebral arteries: Right vertebral artery patent to the basilar. Hypoplastic left vertebral artery ends in PICA Skeleton: Cervical spondylosis.  No acute skeletal abnormality. Other neck: Negative for mass or adenopathy in the neck. Upper chest: Lung apices clear bilaterally. Review of the MIP images confirms the above findings CTA HEAD FINDINGS Anterior circulation: Atherosclerotic calcification in the cavernous carotid bilaterally without significant stenosis. Anterior and middle cerebral arteries patent bilaterally without significant stenosis or large vessel occlusion. Posterior circulation: Left vertebral artery ends in PICA. Right vertebral artery supplies the basilar. 6 mm aneurysm with associated calcification in the wall at the vertebrobasilar junction. Mild stenosis distal right vertebral artery proximal to the aneurysm. Basilar is diminutive due to fetal origin of the posterior circulation bilaterally. No significant stenosis or large vessel occlusion in the posterior circulation Venous sinuses: Normal venous enhancement Anatomic variants: None Review of the MIP images confirms the above  findings IMPRESSION: 1. Negative for intracranial large vessel occlusion 2. 6 mm aneurysm of  the right vertebrobasilar junction unchanged from the prior study. 3. Mild atherosclerotic calcification of the carotid bifurcation without significant carotid stenosis. Atherosclerotic calcification in the cavernous carotid bilaterally. Electronically Signed   By: Marlan Palau M.D.   On: 04/30/2020 16:38    CT Angio Neck W and/or Wo Contrast   Result Date: 04/30/2020 CLINICAL DATA:  Acute neuro deficit.  Aphasia. EXAM: CT ANGIOGRAPHY HEAD AND NECK TECHNIQUE: Multidetector CT imaging of the head and neck was performed using the standard protocol during bolus administration of intravenous contrast. Multiplanar CT image reconstructions and MIPs were obtained to evaluate the vascular anatomy. Carotid stenosis measurements (when applicable) are obtained utilizing NASCET criteria, using the distal internal carotid diameter as the denominator. CONTRAST:  73mL OMNIPAQUE IOHEXOL 350 MG/ML SOLN COMPARISON:  CT head 04/30/2020.  CT angio head and neck 09/03/2018 FINDINGS: CTA NECK FINDINGS Aortic arch: Mild atherosclerotic calcification aortic arch. Proximal great vessels tortuous but widely patent. Right carotid system: Atherosclerotic calcification right carotid bifurcation without significant stenosis. Left carotid system: Atherosclerotic calcification left carotid bifurcation without stenosis Vertebral arteries: Right vertebral artery patent to the basilar. Hypoplastic left vertebral artery ends in PICA Skeleton: Cervical spondylosis.  No acute skeletal abnormality. Other neck: Negative for mass or adenopathy in the neck. Upper chest: Lung apices clear bilaterally. Review of the MIP images confirms the above findings CTA HEAD FINDINGS Anterior circulation: Atherosclerotic calcification in the cavernous carotid bilaterally without significant stenosis. Anterior and middle cerebral arteries patent bilaterally without  significant stenosis or large vessel occlusion. Posterior circulation: Left vertebral artery ends in PICA. Right vertebral artery supplies the basilar. 6 mm aneurysm with associated calcification in the wall at the vertebrobasilar junction. Mild stenosis distal right vertebral artery proximal to the aneurysm. Basilar is diminutive due to fetal origin of the posterior circulation bilaterally. No significant stenosis or large vessel occlusion in the posterior circulation Venous sinuses: Normal venous enhancement Anatomic variants: None Review of the MIP images confirms the above findings IMPRESSION: 1. Negative for intracranial large vessel occlusion 2. 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 3. Mild atherosclerotic calcification of the carotid bifurcation without significant carotid stenosis. Atherosclerotic calcification in the cavernous carotid bilaterally. Electronically Signed   By: Marlan Palau M.D.   On: 04/30/2020 16:38    MR BRAIN WO CONTRAST   Result Date: 05/01/2020 CLINICAL DATA:  86 year old male code stroke presentation yesterday. History of 6 x 8 mm aneurysm at the vertebrobasilar junction. EXAM: MRI HEAD WITHOUT CONTRAST TECHNIQUE: Multiplanar, multiecho pulse sequences of the brain and surrounding structures were obtained without intravenous contrast. COMPARISON:  Head CT 04/30/2020 and earlier. Brain MRI 09/03/2018 and earlier. FINDINGS: Brain: No restricted diffusion or evidence of acute infarction. But there is increased susceptibility in the left thalamus since May corresponding to multiple microhemorrhages which are new or increased from that time. The most recent of these appears to be that seen on series 11, image 15 along the dorsal superior thalamus. But there is no regional edema or associated mass effect. Multiple chronic microhemorrhages also in the right thalamus, brainstem and occasionally elsewhere in the cerebral hemispheres appear  stable since May. Advanced chronic T2 heterogeneity throughout the bilateral deep gray nuclei. Increased T2 heterogeneity related to lacunar infarcts in the pons since May (series 10, image 8). Additional confluent bilateral cerebral white matter T2 and FLAIR hyperintensity with multiple areas most resembling chronic white matter lacunar infarcts. No definite cortical encephalomalacia. No midline shift, mass effect, evidence of  mass lesion, ventriculomegaly, extra-axial collection or acute intracranial hemorrhage. Cervicomedullary junction and pituitary are within normal limits. Vascular: Major intracranial vascular flow voids are stable since May, with vertebrobasilar junction aneurysm redemonstrated on series 10, image 4. Skull and upper cervical spine: Stable and negative; C2-C3 ankylosis. Sinuses/Orbits: Stable and negative. Other: Mastoids remain clear. Stable visible internal auditory structures. IMPRESSION: 1. Progressed small vessel disease in the left thalamus and pons since May, including a late subacute appearing left thalamic microhemorrhage corresponding to the area of density on CT yesterday. No associated edema or mass effect. 2. No superimposed acute infarct or acute intracranial hemorrhage identified. 3. Underlying severe chronic small vessel disease, chronic vertebrobasilar junction Aneurysm. Electronically Signed   By: Genevie Ann M.D.   On: 05/01/2020 05:05    Chest Port 1 View   Result Date: 04/30/2020 CLINICAL DATA:  Confusion.  Intracranial hemorrhage. EXAM: PORTABLE CHEST 1 VIEW COMPARISON:  Radiograph 03/18/2011 FINDINGS: Upper normal heart size.The cardiomediastinal contours are normal. Mild aortic atherosclerosis. Subsegmental atelectasis in the left mid lung. Pulmonary vasculature is normal. No consolidation, pleural effusion, or pneumothorax. No acute osseous abnormalities are seen. IMPRESSION: Subsegmental atelectasis in the left mid lung. Electronically Signed   By: Keith Rake  M.D.   On: 04/30/2020 18:22    ECHOCARDIOGRAM COMPLETE   Result Date: 05/01/2020    ECHOCARDIOGRAM REPORT   Patient Name:   Steven Meadows Date of Exam: 05/01/2020 Medical Rec #:  [id number]      Height:       70.5 in Accession #:    [id number]     Weight:       213.6 lb Date of Birth:  [birth year]       BSA:          2.158 m Patient Age:    17 years       BP:           121/105 mmHg Patient Gender: M              HR:           91 bpm. Exam Location:  Inpatient Procedure: 2D Echo, Color Doppler and Cardiac Doppler Indications:    Stroke 434.91 / I163.9  History:        Patient has prior history of Echocardiogram examinations, most                 recent 09/04/2018. Stroke, Arrythmias:Atrial Fibrillation; Risk                 Factors:Hypertension.  Sonographer:    Bernadene Person RDCS Referring Phys: [id number] [place]  1. Left ventricular ejection fraction, by estimation, is 60 to 65%. The left ventricle has normal function. The left ventricle has no regional wall motion abnormalities. Left ventricular diastolic function could not be evaluated.  2. Right ventricular systolic function is normal. The right ventricular size is normal. There is normal pulmonary artery systolic pressure.  3. Left atrial size was moderately dilated.  4. Right atrial size was mildly dilated.  5. The mitral valve is normal in structure. No evidence of mitral valve regurgitation.  6. The aortic valve is tricuspid. Aortic valve regurgitation is not visualized. Mild aortic valve sclerosis is present, with no evidence of aortic valve stenosis.  7. There is mild dilatation of the ascending aorta, measuring 39 mm. FINDINGS  Left Ventricle: Left ventricular ejection fraction, by estimation, is 60 to 65%. The left ventricle has normal function. The left  ventricle has no regional wall motion abnormalities. The left ventricular internal cavity size was normal in size. There is  no left ventricular hypertrophy. Left ventricular diastolic  function could not be evaluated due to atrial fibrillation. Left ventricular diastolic function could not be evaluated. Right Ventricle: The right ventricular size is normal. No increase in right ventricular wall thickness. Right ventricular systolic function is normal. There is normal pulmonary artery systolic pressure. The tricuspid regurgitant velocity is 2.39 m/s, and  with an assumed right atrial pressure of 3 mmHg, the estimated right ventricular systolic pressure is 0000000 mmHg. Left Atrium: Left atrial size was moderately dilated. Right Atrium: Right atrial size was mildly dilated. Pericardium: There is no evidence of pericardial effusion. Mitral Valve: The mitral valve is normal in structure. Mild mitral annular calcification. No evidence of mitral valve regurgitation. Tricuspid Valve: The tricuspid valve is normal in structure. Tricuspid valve regurgitation is not demonstrated. Aortic Valve: The aortic valve is tricuspid. Aortic valve regurgitation is not visualized. Mild aortic valve sclerosis is present, with no evidence of aortic valve stenosis. Pulmonic Valve: The pulmonic valve was not well visualized. Pulmonic valve regurgitation is not visualized. Aorta: The aortic root is normal in size and structure. There is mild dilatation of the ascending aorta, measuring 39 mm. IAS/Shunts: No atrial level shunt detected by color flow Doppler.  LEFT VENTRICLE PLAX 2D LVIDd:         5.40 cm LVIDs:         3.80 cm LV PW:         0.90 cm LV IVS:        0.90 cm LVOT diam:     1.90 cm LV SV:         45 LV SV Index:   21 LVOT Area:     2.84 cm  RIGHT VENTRICLE TAPSE (M-mode): 1.7 cm LEFT ATRIUM             Index       RIGHT ATRIUM           Index LA diam:        4.40 cm 2.04 cm/m  RA Area:     16.60 cm LA Vol (A2C):   52.2 ml 24.19 ml/m RA Volume:   34.20 ml  15.85 ml/m LA Vol (A4C):   91.7 ml 42.50 ml/m LA Biplane Vol: 74.7 ml 34.62 ml/m  AORTIC VALVE LVOT Vmax:   91.80 cm/s LVOT Vmean:  60.000 cm/s LVOT VTI:     0.160 m  AORTA Ao Root diam: 3.70 cm Ao Asc diam:  3.90 cm TRICUSPID VALVE TR Peak grad:   22.8 mmHg TR Vmax:        239.00 cm/s  SHUNTS Systemic VTI:  0.16 m Systemic Diam: 1.90 cm Dani Gobble Croitoru MD Electronically signed by Sanda Klein MD Signature Date/Time: 05/01/2020/11:55:21 AM    Final     CT HEAD CODE STROKE WO CONTRAST   Result Date: 04/30/2020 CLINICAL DATA:  Code stroke.  Neuro deficit, acute. EXAM: CT HEAD WITHOUT CONTRAST TECHNIQUE: Contiguous axial images were obtained from the base of the skull through the vertex without intravenous contrast. COMPARISON:  Head CT May 1st, 2020. FINDINGS: Brain: 6 mm hyperdense focus in the left thalamus. Gray-white differentiation is maintained. No hydrocephalus, extra-axial collection or mass lesion. Vascular: No hyperdense vessel. Calcified plaques in the bilateral carotid siphons and intracranial vertebral arteries. Skull: Normal. Negative for fracture or focal lesion. Sinuses/Orbits: No acute finding. ASPECTS [facility] Stroke Program Early  CT Score) - Ganglionic level infarction (caudate, lentiform nuclei, internal capsule, insula, M1-M3 cortex): 7 - Supraganglionic infarction (M4-M6 cortex): 3 Total score (0-10 with 10 being normal): 10 IMPRESSION: 1. A 6 mm hyperdense focus in the left thalamus, which may reflect a small hemorrhage. 2. ASPECTS is 10. Findings communicated to Dr. Cheral Marker via [facility] paging system at 16:12 on 04/30/2020. Electronically Signed   By: Pedro Earls M.D.   On: 04/30/2020 16:14         Assessment/Plan: Diagnosis: Small left thalamic hemorrhage 1. Does the need for close, 24 hr/day medical supervision in concert with the patient's rehab needs make it unreasonable for this patient to be served in a less intensive setting? Yes 2. Co-Morbidities requiring supervision/potential complications: HTN, OSA, vertebral artery aneurysm, 3 prior ischemic strokes, abdominal aortic atherosclerosis 3. Due to bladder  management, bowel management, safety, skin/wound care, disease management, medication administration, pain management and patient education, does the patient require 24 hr/day rehab nursing? Yes 4. Does the patient require coordinated care of a physician, rehab nurse, therapy disciplines of PT, OT, SLP to address physical and functional deficits in the context of the above medical diagnosis(es)? Yes Addressing deficits in the following areas: balance, endurance, locomotion, strength, transferring, bowel/bladder control, bathing, dressing, feeding, grooming, toileting, cognition and psychosocial support 5. Can the patient actively participate in an intensive therapy program of at least 3 hrs of therapy per day at least 5 days per week? Yes 6. The potential for patient to make measurable gains while on inpatient rehab is excellent 7. Anticipated functional outcomes upon discharge from inpatient rehab are modified independent  with PT, supervision with OT, supervision with SLP. 8. Estimated rehab length of stay to reach the above functional goals is: 7-10 days 9. Anticipated discharge destination: Home 10. Overall Rehab/Functional Prognosis: excellent   RECOMMENDATIONS: This patient's condition is appropriate for continued rehabilitative care in the following setting: CIR Patient has agreed to participate in recommended program. Yes Note that insurance prior authorization may be required for reimbursement for recommended care.   Comment:  1) Small left thalamic hemorrhage: Admit to CIR once insurance auth is obtained and bed is available, 2) Impaired cognition: Patient will require 24/7 supervision which his wife can provide.  3) Expressive aphasia: He will require SLP.  4) Impaired mobility and ADLs: will likely require 7-10 day stay.   Thank you for this consult. Admission coordinator to follow.    I have personally performed a face to face diagnostic evaluation, including, but not limited to  relevant history and physical exam findings, of this patient and developed relevant assessment and plan.  Additionally, I have reviewed and concur with the physician assistant's documentation above.   Leeroy Cha, MD   Bary Leriche, PA-C 05/01/2020          Revision History                                  Routing History              Note Details  Author Ranell Patrick, Clide Deutscher, MD File Time 05/01/2020  3:25 PM  Author Type Physician Status Signed  Last Editor Izora Ribas, MD Service Physical Medicine and Rehabilitation

## 2020-05-03 NOTE — Progress Notes (Signed)
Inpatient Rehabilitation Admissions Coordinator  CIR bed is available to admit pt to today. I met with patient and his wife at bedside and they are aware. I have alerted Dr Erlinda Hong, acute team and TOC. I will make the arrangements to admit today.  Danne Baxter, RN, MSN Rehab Admissions Coordinator [phone] 05/03/2020 10:32 AM

## 2020-05-03 NOTE — Care Management Important Message (Signed)
Important Message  Patient Details  Name: Steven Meadows MRN: [id number] Date of Birth: Jul 22, 1931   Medicare Important Message Given:  Yes     Dorena Bodo 05/03/2020, 4:08 PM

## 2020-05-03 NOTE — Progress Notes (Signed)
Physical Therapy Treatment Patient Details Name: Steven Meadows MRN: [id number] DOB: 1932/02/26 Today's Date: 05/03/2020    History of Present Illness ESAW DIEL is an 86 y.o. male presenting wtih receptive and expressive aphasia and R visual neglect. Imaging revealed acute L thalamic hemorrhage. PMHx of atrial fibrillation (on Eliquis), 3 prior ischemic strokes, abdominal aortic atherosclerosis, vertebral artery aneurysm, HTN, OSA, hypercholesterolemia and rotator cuff tear.    PT Comments    Pt making progress towards his goals ambulating an increased distance of ~150 ft with a RW and min guard assist this date. Initially, pt displayed decreased bilat step length, but this improved when provided visual cues of square tiles to step in on floor. However, then the pt continued to look down at his feet and thus was cued to scan his surroundings for objects in the hall while ambulating, with his gait speed and stride length decreasing when multi-tasking. Pt displays some STM deficits as he would forget the plans to ambulate in the hall and continue to turn with his RW until cued to stop at times. Pt demonstrates leg strength deficits as he requires minA to come to stand from EOB but min guard assist when he is able to pull up on a bar to come to stand. Will continue to follow acutely. Current recommendations remain appropriate.   Follow Up Recommendations  CIR     Equipment Recommendations  3in1 (PT) (shower seat)    Recommendations for Other Services       Precautions / Restrictions Precautions Precautions: Fall Restrictions Weight Bearing Restrictions: No    Mobility  Bed Mobility Overal bed mobility: Needs Assistance Bed Mobility: Supine to Sit;Sit to Supine     Supine to sit: HOB elevated;Min assist Sit to supine: Min guard;HOB elevated   General bed mobility comments: MinA for trunk to come to sit EOB. Min guard to return to supine.  Transfers Overall transfer level:  Needs assistance Equipment used: Rolling walker (2 wheeled) Transfers: Sit to/from Stand Sit to Stand: Min assist         General transfer comment: MinA and extra time to power up to stand from EOB, cuing for hand placement. Min guard to come to stand from toilet when able to pull up on wall rail.  Ambulation/Gait Ambulation/Gait assistance: Min guard Gait Distance (Feet): 150 Feet Assistive device: Rolling walker (2 wheeled) Gait Pattern/deviations: Step-through pattern;Trunk flexed;Shuffle;Decreased stride length Gait velocity: decreased Gait velocity interpretation: <1.31 ft/sec, indicative of household ambulator General Gait Details: Ambulates with trunk flexed and decreased bilat step length, which improved when cued to place each foot in a square on the floor. However, pt continued to look at feet to inc step length and thus required cues to look superiorly and find objects in hallway, with step length and speed decreasing when doing so.   Stairs             Wheelchair Mobility    Modified Rankin (Stroke Patients Only) Modified Rankin (Stroke Patients Only) Pre-Morbid Rankin Score: Slight disability Modified Rankin: Moderately severe disability     Balance Overall balance assessment: Needs assistance Sitting-balance support: Feet supported;No upper extremity supported Sitting balance-Leahy Scale: Good Sitting balance - Comments: Sitting EOB with min guard for safety.   Standing balance support: Bilateral upper extremity supported;During functional activity Standing balance-Leahy Scale: Poor Standing balance comment: UE support when standing.  Cognition Arousal/Alertness: Awake/alert Behavior During Therapy: WFL for tasks assessed/performed Overall Cognitive Status: Impaired/Different from baseline                       Memory: Decreased short-term memory Following Commands: Follows one step commands  consistently;Follows one step commands with increased time;Follows multi-step commands inconsistently     Problem Solving: Slow processing;Difficulty sequencing;Requires verbal cues General Comments: Pt forgetful of plan to ambulate in hall and then when pt turned into hall he continued to turn and almost re-entered room. Required cues to remember plan for session and to sequence tasks. Difficulty multi-tasking.      Exercises      General Comments        Pertinent Vitals/Pain Pain Assessment: No/denies pain Pain Intervention(s): Monitored during session    Home Living                      Prior Function            PT Goals (current goals can now be found in the care plan section) Acute Rehab PT Goals Patient Stated Goal: to improve PT Goal Formulation: With patient/family Time For Goal Achievement: 05/15/20 Potential to Achieve Goals: Good Progress towards PT goals: Progressing toward goals    Frequency    Min 4X/week      PT Plan Current plan remains appropriate    Co-evaluation              AM-PAC PT "6 Clicks" Mobility   Outcome Measure  Help needed turning from your back to your side while in a flat bed without using bedrails?: A Little Help needed moving from lying on your back to sitting on the side of a flat bed without using bedrails?: A Little Help needed moving to and from a bed to a chair (including a wheelchair)?: A Little Help needed standing up from a chair using your arms (e.g., wheelchair or bedside chair)?: A Little Help needed to walk in hospital room?: A Little Help needed climbing 3-5 steps with a railing? : A Little 6 Click Score: 18    End of Session Equipment Utilized During Treatment: Gait belt Activity Tolerance: Patient tolerated treatment well Patient left: in bed;with call bell/phone within reach;with bed alarm set;with family/visitor present   PT Visit Diagnosis: Unsteadiness on feet (R26.81);Muscle weakness  (generalized) (M62.81);Difficulty in walking, not elsewhere classified (R26.2);Other abnormalities of gait and mobility (R26.89);Other symptoms and signs involving the nervous system (R29.898)     Time: 0932-3557 PT Time Calculation (min) (ACUTE ONLY): 23 min  Charges:  $Gait Training: 8-22 mins $Therapeutic Activity: 8-22 mins                     Raymond Gurney, PT, DPT Acute Rehabilitation Services  Pager: [phone] Office: [phone]    Jewel Baize 05/03/2020, 1:24 PM

## 2020-05-04 ENCOUNTER — Inpatient Hospital Stay (HOSPITAL_COMMUNITY): Payer: Medicare Other | Admitting: Physical Therapy

## 2020-05-04 ENCOUNTER — Inpatient Hospital Stay (HOSPITAL_COMMUNITY): Payer: Medicare Other

## 2020-05-04 ENCOUNTER — Inpatient Hospital Stay (HOSPITAL_COMMUNITY): Payer: Medicare Other | Admitting: Speech Pathology

## 2020-05-04 LAB — CBC WITH DIFFERENTIAL/PLATELET
Abs Immature Granulocytes: 0.05 10*3/uL (ref 0.00–0.07)
Basophils Absolute: 0 10*3/uL (ref 0.0–0.1)
Basophils Relative: 1 %
Eosinophils Absolute: 0.2 10*3/uL (ref 0.0–0.5)
Eosinophils Relative: 2 %
HCT: 45 % (ref 39.0–52.0)
Hemoglobin: 15.8 g/dL (ref 13.0–17.0)
Immature Granulocytes: 1 %
Lymphocytes Relative: 35 %
Lymphs Abs: 2.3 10*3/uL (ref 0.7–4.0)
MCH: 29.4 pg (ref 26.0–34.0)
MCHC: 35.1 g/dL (ref 30.0–36.0)
MCV: 83.8 fL (ref 80.0–100.0)
Monocytes Absolute: 1.2 10*3/uL — ABNORMAL HIGH (ref 0.1–1.0)
Monocytes Relative: 18 %
Neutro Abs: 2.8 10*3/uL (ref 1.7–7.7)
Neutrophils Relative %: 43 %
Platelets: 180 10*3/uL (ref 150–400)
RBC: 5.37 MIL/uL (ref 4.22–5.81)
RDW: 13.3 % (ref 11.5–15.5)
WBC: 6.5 10*3/uL (ref 4.0–10.5)
nRBC: 0 % (ref 0.0–0.2)

## 2020-05-04 LAB — COMPREHENSIVE METABOLIC PANEL
ALT: 27 U/L (ref 0–44)
AST: 24 U/L (ref 15–41)
Albumin: 4 g/dL (ref 3.5–5.0)
Alkaline Phosphatase: 26 U/L — ABNORMAL LOW (ref 38–126)
Anion gap: 11 (ref 5–15)
BUN: 18 mg/dL (ref 8–23)
CO2: 23 mmol/L (ref 22–32)
Calcium: 9.5 mg/dL (ref 8.9–10.3)
Chloride: 95 mmol/L — ABNORMAL LOW (ref 98–111)
Creatinine, Ser: 1.27 mg/dL — ABNORMAL HIGH (ref 0.61–1.24)
GFR, Estimated: 55 mL/min — ABNORMAL LOW (ref >60)
Glucose, Bld: 102 mg/dL — ABNORMAL HIGH (ref 70–99)
Potassium: 3.4 mmol/L — ABNORMAL LOW (ref 3.5–5.1)
Sodium: 129 mmol/L — ABNORMAL LOW (ref 135–145)
Total Bilirubin: 1.2 mg/dL (ref 0.3–1.2)
Total Protein: 6.7 g/dL (ref 6.5–8.1)

## 2020-05-04 MED ORDER — POTASSIUM CHLORIDE CRYS ER 10 MEQ PO TBCR
10.0000 meq | EXTENDED_RELEASE_TABLET | Freq: Every day | ORAL | Status: DC
  Administered 2020-05-04 – 2020-05-15 (×12): 10 meq via ORAL
  Filled 2020-05-04 (×12): qty 1

## 2020-05-04 NOTE — Plan of Care (Signed)
  Problem: Consults Goal: RH STROKE PATIENT EDUCATION Description: See Patient Education module for education specifics  Outcome: Progressing   Problem: RH SAFETY Goal: RH STG ADHERE TO SAFETY PRECAUTIONS W/ASSISTANCE/DEVICE Description: STG Adhere to Safety Precautions With  cues/reminders Assistance/Device. Outcome: Progressing   Problem: RH KNOWLEDGE DEFICIT Goal: RH STG INCREASE KNOWLEDGE OF HYPERTENSION Outcome: Progressing Goal: RH STG INCREASE KNOWLEGDE OF HYPERLIPIDEMIA Description: Patient will be able to manage HLD with medications and dietary modifications using handouts and educational materials with cues, reminders Outcome: Progressing Goal: RH STG INCREASE KNOWLEDGE OF STROKE PROPHYLAXIS Description: Patient will be able to manage secondary stroke risks with medications and dietary modifications using handouts and educational materials with cues, reminders Outcome: Progressing

## 2020-05-04 NOTE — Progress Notes (Signed)
[facility] PHYSICAL MEDICINE & REHABILITATION PROGRESS NOTE   Subjective/Complaints:  No issues overnight, asking if I could hear his Afib ROS- neg CP, SOB, N/V/D Objective:   No results found. Recent Labs    05/03/20 0312 05/04/20 0434  WBC 8.4 6.5  HGB 16.1 15.8  HCT 45.4 45.0  PLT 181 180   Recent Labs    05/03/20 0312 05/04/20 0434  NA 131* 129*  K 3.7 3.4*  CL 96* 95*  CO2 22 23  GLUCOSE 108* 102*  BUN 19 18  CREATININE 1.25* 1.27*  CALCIUM 9.6 9.5    Intake/Output Summary (Last 24 hours) at 05/04/2020 0959 Last data filed at 05/04/2020 0904 Gross per 24 hour  Intake 240 ml  Output 810 ml  Net -570 ml        Physical Exam: Vital Signs Blood pressure (!) 135/94, pulse 68, temperature 97.9 F (36.6 C), resp. rate 18, height 5\' 11"  (1.803 m), weight 97.1 kg, SpO2 96 %.  General: No acute distress Mood and affect are appropriate Heart: Regular rate and rhythm no rubs murmurs or extra sounds Lungs: Clear to auscultation, breathing unlabored, no rales or wheezes Abdomen: Positive bowel sounds, soft nontender to palpation, nondistended Extremities: No clubbing, cyanosis, or edema Skin: No evidence of breakdown, no evidence of rash Neurologic: Cranial nerves II through XII intact, motor strength is 5/5 in bilateral deltoid, bicep, tricep, grip, hip flexor, knee extensors, ankle dorsiflexor and plantar flexor Sensory exam normal sensation to light touch and proprioception in bilateral upper and lower extremities  Musculoskeletal: Full range of motion in all 4 extremities. No joint swelling   Assessment/Plan: 1. Functional deficits which require 3+ hours per day of interdisciplinary therapy in a comprehensive inpatient rehab setting.  Physiatrist is providing close team supervision and 24 hour management of active medical problems listed below.  Physiatrist and rehab team continue to assess barriers to discharge/monitor patient progress toward functional  and medical goals  Care Tool:  Bathing              Bathing assist       Upper Body Dressing/Undressing Upper body dressing        Upper body assist      Lower Body Dressing/Undressing Lower body dressing            Lower body assist       Toileting Toileting    Toileting assist       Transfers Chair/bed transfer  Transfers assist           Locomotion Ambulation   Ambulation assist              Walk 10 feet activity   Assist           Walk 50 feet activity   Assist           Walk 150 feet activity   Assist           Walk 10 feet on uneven surface  activity   Assist           Wheelchair     Assist               Wheelchair 50 feet with 2 turns activity    Assist            Wheelchair 150 feet activity     Assist          Blood pressure (!) 135/94, pulse 68, temperature 97.9 F (36.6 C),  resp. rate 18, height 5\' 11"  (1.803 m), weight 97.1 kg, SpO2 96 %.   Medical Problem List and Plan: 1.  Altered mental status with dysarthric speech secondary to small left thalamic hemorrhage in setting of Eliquis coagulopathy             -patient may shower             -ELOS/Goals: 10-14 days S 2.  Antithrombotics: -DVT/anticoagulation: SCDs             -antiplatelet therapy: N/A 3. Pain Management: Tylenol as needed 4. Mood: Provide emotional support             -antipsychotic agents: N/A 5. Neuropsych: This patient he is capable of making decisions on his own behalf. 6. Skin/Wound Care: Routine skin checks 7. Fluids/Electrolytes/Nutrition: Electrolytes stable 12/30, repeat tomorrow 8.  Hypertension.  Well controlled, continue Cardizem 240 mg daily, hydralazine 25 mg twice daily, Maxide 1 tablet daily.  Monitor with increased mobility Vitals:   05/04/20 0843 05/04/20 0846  BP: (!) 134/94 (!) 135/94  Pulse:  68  Resp:    Temp:    SpO2:      9.  Atrial fibrillation.  Continue  Cardizem.  Cardiac rate controlled.  Eliquis discontinued due to thalamic hemorrhage 10.  Hyperlipidemia.  Zetia  11.  Hypokalemia- Likely related to maxide will supplement  LOS: 1 days A FACE TO FACE EVALUATION WAS PERFORMED  05/06/20 05/04/2020, 9:59 AM

## 2020-05-04 NOTE — Progress Notes (Signed)
Patient information reviewed and entered into eRehab System by Becky Saida Lonon, PPS coordinator. Information including medical coding, function ability, and quality indicators will be reviewed and updated through discharge.   

## 2020-05-04 NOTE — Evaluation (Signed)
Speech Language Pathology Assessment and Plan  Patient Details  Name: JIN CAPOTE MRN: [id number] Date of Birth: 1931-12-21  SLP Diagnosis: Cognitive Impairments  Rehab Potential:   ELOS: N/A for SLP    Today's Date: 05/04/2020 SLP Individual Time: 1100-1145 SLP Individual Time Calculation (min): 45 min   Hospital Problem: Principal Problem:   Thalamic hemorrhage ([place])  Past Medical History:  Past Medical History:  Diagnosis Date  . Abdominal aortic atherosclerosis ([place])   . Actinic keratoses   . 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 Atrial fibrillation ([place])   . Cough   . History of mononucleosis   . HTN (hypertension)   . Hypercholesteremia   . Lacunar stroke ([place])   . Low back pain   . Olecranon bursitis   . Rotator cuff tear   . Stroke [facility])    Past Surgical History:  Past Surgical History:  Procedure Laterality Date  . CATARACT EXTRACTION--right eye  2013  . TONSILLECTOMY AND ADENOIDECTOMY      Assessment / Plan / Recommendation  is a 86 y.o. year old male with history of hypertension atrial fibrillation on Eliquis, severe neuropathy prior CVA, VA aneurysm who was admitted 04/30/2020 with acute onset of confusion and garbled speech.Per chart review patient lives with spouse.Reportedly independent prior to admission and active.Two-level home bed and bath main level 2 steps to entry.CT of the head showed a 6 mm hyperdense focus in the left thalamus reflecting small hemorrhage.CT angiogram head and neck negative for intracranial large vessel occlusion.Eliquis was reversed with Andexxa.Neurology services felt small thalamic hemorrhage in setting of coagulopathy due to Eliquis maintained on low-dose Cleviprex for blood pressure management.Follow-up MRI of the brain revealed progression of small vessel disease in the left thalamus and pons with late subacute left thalamic microhemorrhage and severe chronic small vessel disease. Patient transferred to CIR  on 05/03/2020 .    Clinical Impression Patient presents with mild memory impairment and patient self-reporting intermittent difficulty thinking of particular word or name. Problem solving, reasoning, awareness, speech production/articulation and language production are all WFL. Patient did not exhibit any significant language errors during formal testing or during informal conversation with SLP. He recalled 2/5 words after 3 minute delay and recalled 3/5 with choice cues. Patient was oriented to place, date, situation and was able to describe in detail about what led to his current hospital admission. Patient is not presenting with any suspected dementia as per this ST evaluation and he is not exhibiting any unsafe behaviors, as his safety awareness and reasoning all appear intact. Patient's main concern is his difficulty walking secondary to h/o neuropathy. He informed SLP that he had recently "voluntarily stopped driving" as he was not able to effectively push on brakes/gas. Although patient is demonstrating a mild memory impairment, he will be focusing on PT and OT during his CIR stay, who are predicting a 7-10 day stay and therefore, significant benefit from SLP intervention at this time is not indicated.   Skilled Therapeutic Interventions          Cognistat testing, speech-language cognitive testing  SLP Assessment  Patient does not need any further Speech [place] Pathology Services    Recommendations  Patient destination: Home Follow up Recommendations: None Equipment Recommended: None recommended by SLP    SLP Frequency   N/A  SLP Duration  SLP Intensity  SLP Treatment/Interventions N/A for SLP   N/A    N/A   Pain Pain Assessment Pain Scale: 0-10 Pain Score: 0-No  pain  Prior Functioning Cognitive/Linguistic Baseline: Information not available Type of Home: House  Lives With: Spouse Available Help at Discharge: Family;Available 24 hours/day Education: [employer] Vocation: Retired  Programmer, systems Overall Cognitive Status: No family/caregiver present to determine baseline cognitive functioning Arousal/Alertness: Awake/alert Orientation Level: Oriented X4 Attention: Selective Sustained Attention: Appears intact Selective Attention: Appears intact Memory: Impaired Memory Impairment: Retrieval deficit Awareness: Appears intact Problem Solving: Appears intact Safety/Judgment: Appears intact  Comprehension Auditory Comprehension Overall Auditory Comprehension: Appears within functional limits for tasks assessed Expression Expression Primary Mode of Expression: Verbal Verbal Expression Overall Verbal Expression: Other (comment) (Patient reported he sometimes has trouble thinking of a particular word but SLP did not observe this in conversation) Initiation: No impairment Naming: No impairment Oral Motor Oral Motor/Sensory Function Overall Oral Motor/Sensory Function: Within functional limits Motor Speech Overall Motor Speech: Appears within functional limits for tasks assessed  Care Tool Care Tool Cognition Expression of Ideas and Wants Expression of Ideas and Wants: Without difficulty (complex and basic) - expresses complex messages without difficulty and with speech that is clear and easy to understand   Understanding Verbal and Non-Verbal Content Understanding Verbal and Non-Verbal Content: Usually understands - understands most conversations, but misses some part/intent of message. Requires cues at times to understand   Memory/Recall Ability *first 3 days only Memory/Recall Ability *first 3 days only: That he or she is in a hospital/hospital unit;Current season    Short Term Goals: No short term goals set  Refer to Care Plan for Long Term Goals  Recommendations for other services: None   Discharge Criteria: Patient will be discharged from SLP if patient refuses treatment 3 consecutive times without medical reason,  if treatment goals not met, if there is a change in medical status, if patient makes no progress towards goals or if patient is discharged from hospital.  The above assessment, treatment plan, treatment alternatives and goals were discussed and mutually agreed upon: by patient  Sonia Baller, MA, CCC-SLP Speech Therapy

## 2020-05-04 NOTE — Progress Notes (Signed)
Inpatient Rehabilitation Center Individual Statement of Services  Patient Name:  Steven Meadows  Date:  05/04/2020  Welcome to the Inpatient Rehabilitation Center.  Our goal is to provide you with an individualized program based on your diagnosis and situation, designed to meet your specific needs.  With this comprehensive rehabilitation program, you will be expected to participate in at least 3 hours of rehabilitation therapies Monday-Friday, with modified therapy programming on the weekends.  Your rehabilitation program will include the following services:  Physical Therapy (PT), Occupational Therapy (OT), Speech Therapy (ST), 24 hour per day rehabilitation nursing, Therapeutic Recreaction (TR), Neuropsychology, Care Coordinator, Rehabilitation Medicine, Nutrition Services, Pharmacy Services and Other  Weekly team conferences will be held on Wednesday to discuss your progress.  Your Inpatient Rehabilitation Care Coordinator will talk with you frequently to get your input and to update you on team discussions.  Team conferences with you and your family in attendance may also be held.  Expected length of stay: 7-10 Days  Overall anticipated outcome: MOD I to Supervision  Depending on your progress and recovery, your program may change. Your Inpatient Rehabilitation Care Coordinator will coordinate services and will keep you informed of any changes. Your Inpatient Rehabilitation Care Coordinator's name and contact numbers are listed  below.  The following services may also be recommended but are not provided by the Inpatient Rehabilitation Center:    Home Health Rehabiltiation Services  Outpatient Rehabilitation Services    Arrangements will be made to provide these services after discharge if needed.  Arrangements include referral to agencies that provide these services.  Your insurance has been verified to be:  Medicare A & B Your primary doctor is:  Marden Noble, MD  Pertinent  information will be shared with your doctor and your insurance company.  Inpatient Rehabilitation Care Coordinator:  Lavera Guise, [place] [phone] or [phone]  Information discussed with and copy given to patient by: Andria Rhein, 05/04/2020, 11:10 AM

## 2020-05-04 NOTE — IPOC Note (Addendum)
Overall Plan of Care [facility]) Patient Details Name: Steven Meadows MRN: [id number] DOB: [birth year]  Admitting Diagnosis: Thalamic hemorrhage [facility])  Hospital Problems: Principal Problem:   Thalamic hemorrhage (HCC) Active Problems:   Hyponatremia   Hypokalemia   AKI (acute kidney injury) (HCC)   Benign essential HTN   Slow transit constipation     Functional Problem List: Nursing Endurance,Medication Management,Safety  PT Balance,Endurance,Motor,Sensory,Skin Integrity  OT Balance,Cognition,Endurance,Motor,Sensory  SLP Cognition  TR         Basic ADL's: OT Eating,Grooming,Bathing,Dressing,Toileting     Advanced  ADL's: OT Simple Meal Preparation,Light Housekeeping     Transfers: PT Bed Mobility,Bed to Chair,Car,Furniture,Floor  OT Tub/Shower,Toilet     Locomotion: PT Ambulation,Wheelchair Mobility,Stairs     Additional Impairments: OT None  SLP        TR      Anticipated Outcomes Item Anticipated Outcome  Self Feeding ind  Swallowing      Basic self-care  mod I  Toileting  mod I   Bathroom Transfers mod I  Bowel/Bladder  n/a  Transfers  Mod I with LRAD  Locomotion  Supervision assist ambulatory with LRAD  Communication     Cognition     Pain  n/a  Safety/Judgment  maintain safety with cues/reminders   Therapy Plan: PT Intensity: Minimum of 1-2 x/day ,45 to 90 minutes PT Frequency: 5 out of 7 days PT Duration Estimated Length of Stay: 6-9 days OT Intensity: Minimum of 1-2 x/day, 45 to 90 minutes OT Frequency: 5 out of 7 days OT Duration/Estimated Length of Stay: 7-10 days SLP Duration/Estimated Length of Stay: N/A for SLP   Due to the current state of emergency, patients may not be receiving their 3-hours of Medicare-mandated therapy.   Team Interventions: Nursing Interventions Patient/Family Education,Disease Management/Prevention,Medication Management,Discharge Planning  PT interventions Ambulation/gait training,Balance/vestibular  training,Discharge planning,Cognitive remediation/compensation,Community reintegration,Disease Engineer, production stimulation,Functional mobility training,Neuromuscular re-education,Psychosocial support,Patient/family education,Pain management,Skin care/wound management,Splinting/orthotics,Stair training,Therapeutic Activities,Therapeutic Exercise,UE/LE Strength taining/ROM,UE/LE Psychiatrist propulsion/positioning,Visual/perceptual remediation/compensation  OT Interventions Balance/vestibular training,Disease mangement/prevention,Neuromuscular re-education,Self Care/advanced ADL retraining,Therapeutic Exercise,UE/LE Strength taining/ROM,Pain management,Skin care/wound Advice worker instruction,Cognitive remediation/compensation,Community reintegration,Patient/family education,UE/LE Coordination activities,Therapeutic Activities,Psychosocial support,Functional mobility training,Discharge planning  SLP Interventions    TR Interventions    SW/CM Interventions Discharge Planning,Psychosocial Support,Patient/Family Education   Barriers to Discharge MD  Medical stability  Nursing Decreased caregiver support,Home environment access/layout    PT Inaccessible home environment,Home environment access/layout    OT      SLP      SW       Team Discharge Planning: Destination: PT-Home ,OT- Home , SLP-Home Projected Follow-up: PT-Home health PT, OT-  Home health OT, SLP-None Projected Equipment Needs: PT- , OT- 3 in 1 bedside comode,Tub/shower seat, SLP-None recommended by SLP Equipment Details: PT- , OT-  Patient/family involved in discharge planning: PT- Patient,  OT-Patient, SLP-Patient  MD ELOS: 10-14d Medical Rehab Prognosis:  Good Assessment:  86 year old right-handed male with history of hypertension atrial fibrillation on Eliquis, severe neuropathy prior CVA, VA aneurysm who was admitted  04/30/2020 with acute onset of confusion and garbled speech.  Per chart review patient lives with spouse.  Reportedly independent prior to admission and active.  Two-level home bed and bath main level 2 steps to entry.  CT of the head showed a 6 mm hyperdense focus in the left thalamus reflecting small hemorrhage.  CT angiogram head and neck negative for intracranial large vessel occlusion.  Eliquis was reversed with Andexxa.  Neurology services felt small thalamic hemorrhage in setting of  coagulopathy due to Eliquis maintained on low-dose Cleviprex for blood pressure management.  Follow-up MRI of the brain revealed progression of small vessel disease in the left thalamus and pons with late subacute left thalamic microhemorrhage and severe chronic small vessel disease.  Tolerating a regular consistency diet.  Physical medicine rehabilitation was consulted to assess candidacy for CIR given impaired cognition and mobility.  Patient was admitted for a comprehensive rehab program. Currently he denies headache or other complaints.    See Team Conference Notes for weekly updates to the plan of care  Now requiring 24/7 Rehab RN,MD, as well as CIR level PT, OT and SLP.  Treatment team will focus on ADLs and mobility with goals set at Mod I

## 2020-05-04 NOTE — Progress Notes (Signed)
Patient ID: Steven Meadows, male   DOB: 05-16-1931, 86 y.o.   MRN: [id number] Patient given handouts and educational materials on secondary stroke risks/prevention including HTN, HLD (High Triglycerides) and prediabetes with A1Cof 6.0. Patient given handouts and reviewed DASH diet, high triglyceride eating plan and monitoring carbohydrate consumption along with HTN management book. No questions or concerns noted at present. Continue to follow along to discharge and address questions and educational needs. Pamelia Hoit

## 2020-05-04 NOTE — Evaluation (Signed)
Physical Therapy Assessment and Plan  Patient Details  Name: Steven Meadows MRN: [id number] Date of Birth: August 10, 1931  PT Diagnosis: Abnormal posture, Abnormality of gait, Ataxia, Ataxic gait and Coordination disorder Rehab Potential: Good ELOS: 6-9 days   Today's Date: 05/04/2020 PT Individual Time: 4235-3614 PT Individual Time Calculation (min): 54 min    Hospital Problem: Principal Problem:   Thalamic hemorrhage ([place])   Past Medical History:  Past Medical History:  Diagnosis Date  . Abdominal aortic atherosclerosis ([place])   . Actinic keratoses   . 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 Atrial fibrillation ([place])   . Cough   . History of mononucleosis   . HTN (hypertension)   . Hypercholesteremia   . Lacunar stroke ([place])   . Low back pain   . Olecranon bursitis   . Rotator cuff tear   . Stroke [facility])    Past Surgical History:  Past Surgical History:  Procedure Laterality Date  . CATARACT EXTRACTION--right eye  2013  . TONSILLECTOMY AND ADENOIDECTOMY      Assessment & Plan Clinical Impression: Patient is a 87 year old right-handed male with history of hypertension atrial fibrillation on Eliquis, severe neuropathy prior CVA, VA aneurysm who was admitted 04/30/2020 with acute onset of confusion and garbled speech.  Per chart review patient lives with spouse.  Reportedly independent prior to admission and active.  Two-level home bed and bath main level 2 steps to entry.  CT of the head showed a 6 mm hyperdense focus in the left thalamus reflecting small hemorrhage.  CT angiogram head and neck negative for intracranial large vessel occlusion.  Eliquis was reversed with Andexxa.  Neurology services felt small thalamic hemorrhage in setting of coagulopathy due to Eliquis maintained on low-dose Cleviprex for blood pressure management.  Follow-up MRI of the brain revealed progression of small vessel disease in the left thalamus and pons with late subacute left thalamic microhemorrhage  and severe chronic small vessel disease.  Tolerating a regular consistency diet.  Physical medicine rehabilitation was consulted to assess candidacy for CIR given impaired cognition and mobility. Patient transferred to CIR on 05/03/2020 .   Patient currently requires min with mobility secondary to muscle joint tightness, decreased cardiorespiratoy endurance, unbalanced muscle activation and decreased coordination and decreased standing balance and decreased balance strategies.  Prior to hospitalization, patient was modified independent  with mobility and lived with Spouse in a House home.  Home access is 2Stairs to enter.  Patient will benefit from skilled PT intervention to maximize safe functional mobility, minimize fall risk and decrease caregiver burden for planned discharge home with intermittent assist.  Anticipate patient will benefit from follow up [facility] at discharge.  PT - End of Session Activity Tolerance: Tolerates 10 - 20 min activity with multiple rests Endurance Deficit: Yes PT Assessment Rehab Potential (ACUTE/IP ONLY): Good PT Barriers to Discharge: Inaccessible home environment;Home environment access/layout PT Patient demonstrates impairments in the following area(s): Balance;Endurance;Motor;Sensory;Skin Integrity PT Transfers Functional Problem(s): Bed Mobility;Bed to Chair;Car;Furniture;Floor PT Locomotion Functional Problem(s): Ambulation;Wheelchair Mobility;Stairs PT Plan PT Intensity: Minimum of 1-2 x/day ,45 to 90 minutes PT Frequency: 5 out of 7 days PT Duration Estimated Length of Stay: 6-9 days PT Treatment/Interventions: Ambulation/gait training;Balance/vestibular training;Discharge planning;Cognitive remediation/compensation;Community reintegration;Disease management/prevention;DME/adaptive equipment instruction;Functional electrical stimulation;Functional mobility training;Neuromuscular re-education;Psychosocial support;Patient/family education;Pain management;Skin  care/wound management;Splinting/orthotics;Stair training;Therapeutic Activities;Therapeutic Exercise;UE/LE Strength taining/ROM;UE/LE Coordination activities;Wheelchair propulsion/positioning;Visual/perceptual remediation/compensation PT Transfers Anticipated Outcome(s): Mod I with LRAD PT Locomotion Anticipated Outcome(s): Supervision assist ambulatory with LRAD PT Recommendation Recommendations for Other Services:  Therapeutic Recreation consult Therapeutic Recreation Interventions: Stress management;Outing/community reintergration Follow Up Recommendations: Home health PT Patient destination: Home   PT Evaluation Precautions/Restrictions   fall  General   Vital SignsTherapy Vitals Temp: 97.6 F (36.4 C) Temp Source: Oral Pulse Rate: 69 Resp: 17 BP: 127/76 Patient Position (if appropriate): Lying Oxygen Therapy SpO2: 96 % O2 Device: Room Air Pain   denies  Home Living/Prior Functioning Home Living Available Help at Discharge: Family;Available 24 hours/day Type of Home: House Home Access: Stairs to enter [employer] of Steps: 2 Entrance Stairs-Rails: Left (uses 1 rail) Home Layout: Two level;Able to live on main level with bedroom/bathroom Alternate Level Stairs-Number of Steps: flight Bathroom Shower/Tub: Multimedia programmer: Standard Bathroom Accessibility: Yes  Lives With: Spouse Prior Function Level of Independence: Independent with basic ADLs;Needs assistance with gait (mod I with gait)  Able to Take Stairs?: Yes Driving: No Vocation: Retired Comments: gardens, grows Teacher, 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 Vision/Perception    WFL Cognition Memory: Impaired Memory Impairment: Retrieval deficit Awareness: Appears intact Problem Solving: Appears intact Safety/Judgment: Appears intact Sensation Sensation Light Touch: Appears Intact Hot/Cold: Appears Intact Proprioception: Appears Intact Stereognosis: Appears Intact Additional Comments:  reports B neuropathy in BLE, but able to detect all stimuli Coordination Gross Motor Movements are Fluid and Coordinated: No Fine Motor Movements are Fluid and Coordinated: Yes Coordination and Movement Description: slowed functional mobility compared to baseline, mild dysmetria BLE Heel Shin Test: poor understanding of test. mild dysmetria functionally Motor  Motor Motor: Abnormal postural alignment and control Motor - Skilled Clinical Observations: impaired ability to maintain upright posture sitting unsupported, EOB, see postural control for more details   Trunk/Postural Assessment  Cervical Assessment Cervical Assessment: Exceptions to [facility] (neck flexion) Thoracic Assessment Thoracic Assessment: Exceptions to [facility] (rounded shoulders) Lumbar Assessment Lumbar Assessment: Exceptions to [facility] (posterior pelvic tilt) Postural Control Postural Control: Deficits on evaluation (difficulty maintaing upright posture when seated unsupported, standing)  Balance Balance Balance Assessed: Yes Static Sitting Balance Static Sitting - Balance Support: Feet supported Static Sitting - Level of Assistance: 5: Stand by assistance (close supervision) Dynamic Sitting Balance Dynamic Sitting - Balance Support: Feet supported (CGA) Static Standing Balance Static Standing - Balance Support: No upper extremity supported Static Standing - Level of Assistance: 4: Min assist (CGA) Dynamic Standing Balance Dynamic Standing - Balance Support: Bilateral upper extremity supported;During functional activity Dynamic Standing - Level of Assistance: 4: Min assist (CGA) Extremity Assessment      RLE Assessment RLE Assessment: Within Functional Limits General Strength Comments: grossly 4+/5 proximal to distal LLE Assessment LLE Assessment: Within Functional Limits General Strength Comments: grossly 4+/5 proximal to distal  Care Tool Care Tool Bed Mobility Roll left and right activity        Sit to lying  activity   Sit to lying assist level: Contact Guard/Touching assist    Lying to sitting edge of bed activity   Lying to sitting edge of bed assist level: Contact Guard/Touching assist     Care Tool Transfers Sit to stand transfer   Sit to stand assist level: Minimal Assistance - Patient > 75%    Chair/bed transfer   Chair/bed transfer assist level: Minimal Assistance - Patient > 75%     Physiological scientist transfer assist level: Minimal Assistance - Patient > 75%      Care Tool Locomotion Ambulation     Assistive device: Walker-rolling Max distance:  50  Walk 10 feet activity   Assist level: Minimal Assistance - Patient > 75% Assistive device: Walker-rolling   Walk 50 feet with 2 turns activity   Assist level: Minimal Assistance - Patient > 75% Assistive device: Walker-rolling  Walk 150 feet activity Walk 150 feet activity did not occur: Safety/medical concerns      Walk 10 feet on uneven surfaces activity Walk 10 feet on uneven surfaces activity did not occur: Safety/medical concerns      Stairs   Assist level: Minimal Assistance - Patient > 75% Stairs assistive device: 1 hand rail Max number of stairs: 4  Walk up/down 1 step activity   Walk up/down 1 step (curb) assist level: Minimal Assistance - Patient > 75% Walk up/down 1 step or curb assistive device: 1 hand rail    Walk up/down 4 steps activity Walk up/down 4 steps assist level: Minimal Assistance - Patient > 75% Walk up/down 4 steps assistive device: 1 hand rail  Walk up/down 12 steps activity Walk up/down 12 steps activity did not occur: Safety/medical concerns      Pick up small objects from floor Pick up small object from the floor (from standing position) activity did not occur: Safety/medical concerns      Wheelchair   Type of Wheelchair: Manual   Wheelchair assist level: Minimal Assistance - Patient > 75% Max wheelchair distance: 150  Wheel 50 feet with 2 turns activity    Assist Level: Minimal Assistance - Patient > 75%  Wheel 150 feet activity   Assist Level: Minimal Assistance - Patient > 75%    Refer to Care Plan for Long Term Goals  SHORT TERM GOAL WEEK 1 PT Short Term Goal 1 (Week 1): STG=LTG due to ELOS  Recommendations for other services: Therapeutic Recreation  Stress management and Outing/community reintegration  Skilled Therapeutic Intervention  Pt received supine in bed and agreeable to PT. Supine>sit transfer with CGA for safety. PT instructed patient in PT Evaluation and initiated treatment intervention; see above for results. PT educated patient in [place], rehab potential, rehab goals, and discharge recommendations. Gait training and stair maangement training as listed below. Car transfer with RW and min assist for safety and well as cues for sit>pivot technique. Pt performed 5xSTS: 47 sec (>15 sec indicates increased fall risk). Pt returned to room and performed stand pivot transfer to bed with CGA and RW. Sit>supine completed with supervision assist, and left supine in bed with call bell in reach and all needs met.      Mobility Bed Mobility Bed Mobility: Rolling Right;Rolling Left;Supine to Sit;Sit to Supine Rolling Right: Supervision/verbal cueing Rolling Left: Supervision/Verbal cueing Supine to Sit: Contact Guard/Touching assist Sit to Supine: Contact Guard/Touching assist Transfers Transfers: Sit to Stand;Stand to Sit;Stand Pivot Transfers Sit to Stand: Minimal Assistance - Patient > 75% Stand to Sit: Minimal Assistance - Patient > 75% Stand Pivot Transfers: Minimal Assistance - Patient > 75% Transfer (Assistive device): Rolling walker Locomotion  Gait Ambulation: Yes Gait Assistance: Minimal Assistance - Patient > 75% Gait Distance (Feet): 50 Feet Assistive device: Rolling walker Gait Gait: Yes Gait Pattern: Impaired Gait Pattern: Narrow base of support;Shuffle Stairs / Additional Locomotion Stairs: Yes Stairs  Assistance: Minimal Assistance - Patient > 75% Stair Management Technique: One rail Left Number of Stairs: 4 Height of Stairs: 6 Wheelchair Mobility Wheelchair Mobility: Yes Wheelchair Assistance: Development worker, international aid: Both upper extremities Distance: 150   Discharge Criteria: Patient will be discharged from PT if patient refuses treatment  3 consecutive times without medical reason, if treatment goals not met, if there is a change in medical status, if patient makes no progress towards goals or if patient is discharged from hospital.  The above assessment, treatment plan, treatment alternatives and goals were discussed and mutually agreed upon: by patient  Lorie Phenix 05/04/2020, 6:26 PM

## 2020-05-04 NOTE — Plan of Care (Signed)
  Problem: RH Balance Goal: LTG Patient will maintain dynamic sitting balance (PT) Description: LTG:  Patient will maintain dynamic sitting balance with assistance during mobility activities (PT) Flowsheets (Taken 05/04/2020 1533) LTG: Pt will maintain dynamic sitting balance during mobility activities with:: Independent with assistive device  Goal: LTG Patient will maintain dynamic standing balance (PT) Description: LTG:  Patient will maintain dynamic standing balance with assistance during mobility activities (PT) Flowsheets (Taken 05/04/2020 1533) LTG: Pt will maintain dynamic standing balance during mobility activities with:: Independent with assistive device    Problem: RH Bed Mobility Goal: LTG Patient will perform bed mobility with assist (PT) Description: LTG: Patient will perform bed mobility with assistance, with/without cues (PT). Flowsheets (Taken 05/04/2020 1533) LTG: Pt will perform bed mobility with assistance level of: Independent with assistive device    Problem: RH Bed to Chair Transfers Goal: LTG Patient will perform bed/chair transfers w/assist (PT) Description: LTG: Patient will perform bed to chair transfers with assistance (PT). Flowsheets (Taken 05/04/2020 1533) LTG: Pt will perform Bed to Chair Transfers with assistance level: Independent with assistive device    Problem: RH Car Transfers Goal: LTG Patient will perform car transfers with assist (PT) Description: LTG: Patient will perform car transfers with assistance (PT). Flowsheets (Taken 05/04/2020 1533) LTG: Pt will perform car transfers with assist:: Supervision/Verbal cueing   Problem: RH Ambulation Goal: LTG Patient will ambulate in controlled environment (PT) Description: LTG: Patient will ambulate in a controlled environment, # of feet with assistance (PT). Flowsheets (Taken 05/04/2020 1533) LTG: Pt will ambulate in controlled environ  assist needed:: Supervision/Verbal cueing LTG: Ambulation  distance in controlled environment: 12ft with LRAD Goal: LTG Patient will ambulate in home environment (PT) Description: LTG: Patient will ambulate in home environment, # of feet with assistance (PT). Flowsheets (Taken 05/04/2020 1533) LTG: Pt will ambulate in home environ  assist needed:: Supervision/Verbal cueing LTG: Ambulation distance in home environment: 34ft with LRAD   Problem: RH Wheelchair Mobility Goal: LTG Patient will propel w/c in controlled environment (PT) Description: LTG: Patient will propel wheelchair in controlled environment, # of feet with assist (PT) Flowsheets (Taken 05/04/2020 1533) LTG: Pt will propel w/c in controlled environ  assist needed:: Supervision/Verbal cueing LTG: Propel w/c distance in controlled environment: 100   Problem: RH Stairs Goal: LTG Patient will ambulate up and down stairs w/assist (PT) Description: LTG: Patient will ambulate up and down # of stairs with assistance (PT) Flowsheets (Taken 05/04/2020 1533) LTG: Pt will ambulate up/down stairs assist needed:: Supervision/Verbal cueing LTG: Pt will  ambulate up and down number of stairs: 2 steps with 1 rail

## 2020-05-04 NOTE — Evaluation (Signed)
Occupational Therapy Assessment and Plan  Patient Details  Name: Steven Meadows MRN: [id number] Date of Birth: 09/10/1931  OT Diagnosis: abnormal posture, cognitive deficits, muscle weakness (generalized) and decreased activity tolerance Rehab Potential: Rehab Potential (ACUTE ONLY): Good ELOS: 7-10 days   Today's Date: 05/04/2020 OT Individual Time: 3474-2595 OT Individual Time Calculation (min): 55 min     Hospital Problem: Principal Problem:   Thalamic hemorrhage ([place])   Past Medical History:  Past Medical History:  Diagnosis Date  . Abdominal aortic atherosclerosis ([place])   . Actinic keratoses   . 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 Atrial fibrillation ([place])   . Cough   . History of mononucleosis   . HTN (hypertension)   . Hypercholesteremia   . Lacunar stroke ([place])   . Low back pain   . Olecranon bursitis   . Rotator cuff tear   . Stroke [facility])    Past Surgical History:  Past Surgical History:  Procedure Laterality Date  . CATARACT EXTRACTION--right eye  2013  . TONSILLECTOMY AND ADENOIDECTOMY      Assessment & Plan Clinical Impression: Patient is a 86 y.o. year old male with history of hypertension atrial fibrillation on Eliquis, severe neuropathy prior CVA, VA aneurysm who was admitted 04/30/2020 with acute onset of confusion and garbled speech.Per chart review patient lives with spouse.Reportedly independent prior to admission and active.Two-level home bed and bath main level 2 steps to entry.CT of the head showed a 6 mm hyperdense focus in the left thalamus reflecting small hemorrhage.CT angiogram head and neck negative for intracranial large vessel occlusion.Eliquis was reversed with Andexxa.Neurology services felt small thalamic hemorrhage in setting of coagulopathy due to Eliquis maintained on low-dose Cleviprex for blood pressure management.Follow-up MRI of the brain revealed progression of small vessel disease in the left thalamus and pons with  late subacute left thalamic microhemorrhage and severe chronic small vessel disease. Patient transferred to CIR on 05/03/2020 .    Patient currently requires CGA to mod A with basic self-care skills secondary to muscle weakness, decreased cardiorespiratoy endurance and decreased memory.  Prior to hospitalization, patient could complete BADLs/IADLs/func mobility with modified independent .  Patient will benefit from skilled intervention to increase independence with basic self-care skills and increase level of independence with iADL prior to discharge home with care partner.  Anticipate patient will require intermittent supervision and follow up home health.  OT - End of Session Activity Tolerance: Tolerates 10 - 20 min activity with multiple rests Endurance Deficit: Yes Endurance Deficit Description: req consistent rest breaks with activity, decreased activty tolerance from baseline OT Assessment Rehab Potential (ACUTE ONLY): Good OT Patient demonstrates impairments in the following area(s): Balance;Cognition;Endurance;Motor;Sensory OT Basic ADL's Functional Problem(s): Eating;Grooming;Bathing;Dressing;Toileting OT Advanced ADL's Functional Problem(s): Simple Meal Preparation;Light Housekeeping OT Transfers Functional Problem(s): Tub/Shower;Toilet OT Additional Impairment(s): None OT Plan OT Intensity: Minimum of 1-2 x/day, 45 to 90 minutes OT Frequency: 5 out of 7 days OT Duration/Estimated Length of Stay: 7-10 days OT Treatment/Interventions: Balance/vestibular training;Disease mangement/prevention;Neuromuscular re-education;Self Care/advanced ADL retraining;Therapeutic Exercise;UE/LE Strength taining/ROM;Pain management;Skin care/wound managment;DME/adaptive equipment instruction;Cognitive remediation/compensation;Community reintegration;Patient/family education;UE/LE Coordination activities;Therapeutic Activities;Psychosocial support;Functional mobility training;Discharge planning OT Self  Feeding Anticipated Outcome(s): ind OT Basic Self-Care Anticipated Outcome(s): mod I OT Toileting Anticipated Outcome(s): mod I OT Bathroom Transfers Anticipated Outcome(s): mod I OT Recommendation Patient destination: Home Follow Up Recommendations: Home health OT Equipment Recommended: 3 in 1 bedside comode;Tub/shower seat   OT Evaluation Precautions/Restrictions  Precautions Precautions: Fall;Other (comment) Precaution Comments: HOH, STM impairment Restrictions  Weight Bearing Restrictions: No General Chart Reviewed: Yes Vital Signs Therapy Vitals Pulse Rate: 68 BP: (!) 135/94 Pain Pain Assessment Pain Scale: 0-10 Pain Score: 0-No pain Home Living/Prior Functioning Home Living Available Help at Discharge: Family,Available 24 hours/day Type of Home: House Home Access: Stairs to enter Technical brewer of Steps: 2 Entrance Stairs-Rails: Right Home Layout: Two level,Able to live on main level with bedroom/bathroom Alternate Level Stairs-Number of Steps: flight Bathroom Shower/Tub: Multimedia programmer: Standard  Lives With: Spouse IADL History Homemaking Responsibilities: Yes Prior Function Level of Independence: Independent with basic ADLs,Needs assistance with gait (mod I with gait) Comments: gardens, grows Teacher, 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 Vision Baseline Vision/History: No visual deficits Patient Visual Report: No change from baseline Vision Assessment?: Yes Eye Alignment: Within Functional Limits Ocular Range of Motion: Within Functional Limits Alignment/Gaze Preference: Within Defined Limits Tracking/Visual Pursuits: Able to track stimulus in all quads without difficulty Saccades: Within functional limits Convergence: Within functional limits Visual Fields: No apparent deficits Perception  Perception: Within Functional Limits Praxis Praxis: Intact Cognition Overall Cognitive Status: Impaired/Different from baseline Arousal/Alertness:  Awake/alert Orientation Level: Person;Place;Situation Person: Oriented Place: Oriented Situation: Oriented Year: 2021 Month: December Day of Week: Correct Memory: Impaired Immediate Memory Recall: Sock;Blue;Bed Memory Recall Sock: Not able to recall Memory Recall Blue: With Cue Memory Recall Bed: Not able to recall Attention: Sustained Sustained Attention: Appears intact Awareness: Appears intact Problem Solving: Appears intact Safety/Judgment: Appears intact Sensation Sensation Light Touch: Appears Intact Hot/Cold: Appears Intact Proprioception: Appears Intact Stereognosis: Appears Intact Additional Comments: BUE only, reports B neuropathy in LLE Coordination Gross Motor Movements are Fluid and Coordinated: No Fine Motor Movements are Fluid and Coordinated: Yes Coordination and Movement Description: slowed functional mobility compared to baseline, decreased step length during amb (defer to PT eval for more details) Motor  Motor Motor: Abnormal postural alignment and control Motor - Skilled Clinical Observations: impaired ability to maintain upright posture sitting unsupported, EOB, see postural control for more details  Trunk/Postural Assessment  Cervical Assessment Cervical Assessment: Exceptions to [facility] (neck flexion) Thoracic Assessment Thoracic Assessment: Exceptions to [facility] (rounded shoulders) Lumbar Assessment Lumbar Assessment: Exceptions to [facility] (posterior pelvic tilt) Postural Control Postural Control: Deficits on evaluation (difficulty maintaing upright posture when seated unsupported, standing)  Balance Balance Balance Assessed: Yes Static Sitting Balance Static Sitting - Balance Support: Feet supported Static Sitting - Level of Assistance: 5: Stand by assistance (close supervision) Dynamic Sitting Balance Dynamic Sitting - Balance Support: Feet supported (CGA) Static Standing Balance Static Standing - Balance Support: Bilateral upper extremity  supported Static Standing - Level of Assistance: 5: Stand by assistance (CGA) Dynamic Standing Balance Dynamic Standing - Balance Support: Bilateral upper extremity supported Dynamic Standing - Level of Assistance: 5: Stand by assistance (CGA) Extremity/Trunk Assessment RUE Assessment RUE Assessment: Within Functional Limits Active Range of Motion (AROM) Comments: 3/4 to full shoulder flexion General Strength Comments: 5/5 grip and RUE LUE Assessment LUE Assessment: Within Functional Limits Active Range of Motion (AROM) Comments: 3/4 to full shoulder flexion General Strength Comments: 5/5 grip strength and LUE strength  Care Tool Care Tool Self Care Eating   Eating Assist Level: Contact Guard/Touching assist    Oral Care    Oral Care Assist Level: Contact Guard/Toucning assist    Bathing   Body parts bathed by patient: Face;Right arm;Left arm;Chest;Abdomen;Front perineal area;Right upper leg;Left upper leg;Right lower leg;Left lower leg Body parts bathed by helper: Buttocks   Assist Level: Minimal Assistance - Patient >  75%    Upper Body Dressing(including orthotics)   What is the patient wearing?: Hospital gown only;Pull over shirt   Assist Level: Contact Guard/Touching assist    Lower Body Dressing (excluding footwear)   What is the patient wearing?: Underwear/pull up;Pants Assist for lower body dressing: Moderate Assistance - Patient 50 - 74%    Putting on/Taking off footwear   What is the patient wearing?: Non-skid slipper socks Assist for footwear: Moderate Assistance - Patient 50 - 74%       Care Tool Toileting Toileting activity   Assist for toileting: Moderate Assistance - Patient 50 - 74%     Care Tool Bed Mobility Roll left and right activity   Roll left and right assist level: Supervision/Verbal cueing    Sit to lying activity   Sit to lying assist level: Contact Guard/Touching assist    Lying to sitting edge of bed activity   Lying to sitting edge  of bed assist level: Contact Guard/Touching assist     Care Tool Transfers Sit to stand transfer   Sit to stand assist level: Contact Guard/Touching assist    Chair/bed transfer   Chair/bed transfer assist level: Contact Guard/Touching assist     Toilet transfer   Assist Level: Contact Guard/Touching assist     Care Tool Cognition Expression of Ideas and Wants Expression of Ideas and Wants: Some difficulty - exhibits some difficulty with expressing needs and ideas (e.g, some words or finishing thoughts) or speech is not clear   Understanding Verbal and Non-Verbal Content Understanding Verbal and Non-Verbal Content: Usually understands - understands most conversations, but misses some part/intent of message. Requires cues at times to understand   Memory/Recall Ability *first 3 days only Memory/Recall Ability *first 3 days only: Current season;That he or she is in a hospital/hospital unit    Refer to Care Plan for [place] 1 OT Short Term Goal 1 (Week 1): STG = LTG 2/2 ELOS  Recommendations for other services: None    Skilled Therapeutic Intervention ADL ADL Eating: Contact guard Where Assessed-Eating: Edge of bed Grooming: Contact guard Where Assessed-Grooming: Edge of bed Upper Body Bathing: Contact guard Where Assessed-Upper Body Bathing: Edge of bed Lower Body Bathing: Minimal assistance Where Assessed-Lower Body Bathing: Edge of bed Upper Body Dressing: Contact guard Where Assessed-Upper Body Dressing: Edge of bed Lower Body Dressing: Moderate assistance Where Assessed-Lower Body Dressing: Edge of bed Toileting: Moderate assistance Where Assessed-Toileting: Glass blower/designer: Therapist, music Method: Counselling psychologist: Energy manager: Curator Method: Ambulating Mobility  Bed Mobility Bed Mobility: Rolling Right;Rolling Left;Supine to Sit;Sit to  Supine Rolling Right: Supervision/verbal cueing Rolling Left: Supervision/Verbal cueing Supine to Sit: Contact Guard/Touching assist Sit to Supine: Contact Guard/Touching assist Transfers Sit to Stand: Contact Guard/Touching assist Stand to Sit: Contact Guard/Touching assist  Session Note: Pt received semi-reclined in bed with wife present, agreeable to OT evaluation. Denies pain this session. Reviewed role of CIR OT, evaluation process, ADL/func mobility retraining, goals for therapy, and safety plan. Evaluation completed as documented above. Session focus on seated EOB grooming/bathing and toilet transfer, assist levels documented as above. Pt completed sit<>stand + func mobility/transfers at room level with RW 2/2 and CGA for decreased activity tolerance + for balance. Req mod A for donning socks, threading BLE in pants, and reaching buttocks for posterior pericare in standing. Pt is eager to return to tending to his rose garden of 100+  bushes. Will have wife present at home avail for 24/7 supervision and assist as needed, adult children live out of the area but are also available intermittently. Pt left semi-reclined in bed, bed alarm engaged, call bell in reach, all immediate needs met.  Discharge Criteria: Patient will be discharged from OT if patient refuses treatment 3 consecutive times without medical reason, if treatment goals not met, if there is a change in medical status, if patient makes no progress towards goals or if patient is discharged from hospital.  The above assessment, treatment plan, treatment alternatives and goals were discussed and mutually agreed upon: by patient  Volanda Napoleon, MS, OTR/L 05/04/2020, 10:36 AM

## 2020-05-05 ENCOUNTER — Inpatient Hospital Stay (HOSPITAL_COMMUNITY): Payer: Medicare Other | Admitting: Physical Therapy

## 2020-05-05 ENCOUNTER — Inpatient Hospital Stay (HOSPITAL_COMMUNITY): Payer: Medicare Other | Admitting: Occupational Therapy

## 2020-05-05 DIAGNOSIS — I10 Essential (primary) hypertension: Secondary | ICD-10-CM

## 2020-05-05 DIAGNOSIS — E876 Hypokalemia: Secondary | ICD-10-CM

## 2020-05-05 DIAGNOSIS — E871 Hypo-osmolality and hyponatremia: Secondary | ICD-10-CM

## 2020-05-05 DIAGNOSIS — K5901 Slow transit constipation: Secondary | ICD-10-CM

## 2020-05-05 DIAGNOSIS — N179 Acute kidney failure, unspecified: Secondary | ICD-10-CM

## 2020-05-05 MED ORDER — POLYETHYLENE GLYCOL 3350 17 G PO PACK
17.0000 g | PACK | Freq: Every day | ORAL | Status: DC
  Administered 2020-05-05 – 2020-05-06 (×2): 17 g via ORAL
  Filled 2020-05-05 (×2): qty 1

## 2020-05-05 NOTE — Progress Notes (Addendum)
[place] PHYSICAL MEDICINE & REHABILITATION PROGRESS NOTE   Subjective/Complaints: Patient seen laying in bed this morning.  He states he slept well overnight.  He notes he is constipated.  ROS: + Constipation.  Denies CP, SOB, N/V/D  Objective:   No results found. Recent Labs    05/03/20 0312 05/04/20 0434  WBC 8.4 6.5  HGB 16.1 15.8  HCT 45.4 45.0  PLT 181 180   Recent Labs    05/03/20 0312 05/04/20 0434  NA 131* 129*  K 3.7 3.4*  CL 96* 95*  CO2 22 23  GLUCOSE 108* 102*  BUN 19 18  CREATININE 1.25* 1.27*  CALCIUM 9.6 9.5    Intake/Output Summary (Last 24 hours) at 05/05/2020 0947 Last data filed at 05/05/2020 0700 Gross per 24 hour  Intake 740 ml  Output 1475 ml  Net -735 ml        Physical Exam: Vital Signs Blood pressure (!) 135/94, pulse 77, temperature 98.3 F (36.8 C), temperature source Oral, resp. rate 18, height 5\' 11"  (1.803 m), weight 97.1 kg, SpO2 94 %. Constitutional: No distress . Vital signs reviewed. HENT: Normocephalic.  Atraumatic. Eyes: EOMI. No discharge. Cardiovascular: No JVD.  Irregularly irregular. Respiratory: Normal effort.  No stridor.  Bilateral clear to auscultation. GI: Non-distended.  BS +. Skin: Warm and dry.  Intact. Psych: Normal mood.  Normal behavior. Musc: No edema in extremities.  No tenderness in extremities. Neuro: Alert Motor: 5/5 throughout  Assessment/Plan: 1. Functional deficits which require 3+ hours per day of interdisciplinary therapy in a comprehensive inpatient rehab setting.  Physiatrist is providing close team supervision and 24 hour management of active medical problems listed below.  Physiatrist and rehab team continue to assess barriers to discharge/monitor patient progress toward functional and medical goals  Care Tool:  Bathing    Body parts bathed by patient: Face,Right arm,Left arm,Chest,Abdomen,Front perineal area,Right upper leg,Left upper leg,Right lower leg,Left lower leg   Body  parts bathed by helper: Buttocks     Bathing assist Assist Level: Minimal Assistance - Patient > 75%     Upper Body Dressing/Undressing Upper body dressing   What is the patient wearing?: Hospital gown only,Pull over shirt    Upper body assist Assist Level: Contact Guard/Touching assist    Lower Body Dressing/Undressing Lower body dressing      What is the patient wearing?: Underwear/pull up,Pants     Lower body assist Assist for lower body dressing: Moderate Assistance - Patient 50 - 74%     Toileting Toileting    Toileting assist Assist for toileting: Moderate Assistance - Patient 50 - 74%     Transfers Chair/bed transfer  Transfers assist     Chair/bed transfer assist level: Minimal Assistance - Patient > 75%     Locomotion Ambulation   Ambulation assist      Assist level: Contact Guard/Touching assist Assistive device: Walker-rolling Max distance: 50   Walk 10 feet activity   Assist     Assist level: Minimal Assistance - Patient > 75% Assistive device: Walker-rolling   Walk 50 feet activity   Assist    Assist level: Minimal Assistance - Patient > 75% Assistive device: Walker-rolling    Walk 150 feet activity   Assist Walk 150 feet activity did not occur: Safety/medical concerns         Walk 10 feet on uneven surface  activity   Assist Walk 10 feet on uneven surfaces activity did not occur: Safety/medical concerns  Wheelchair     Assist   Type of Wheelchair: Manual    Wheelchair assist level: Minimal Assistance - Patient > 75% Max wheelchair distance: 150    Wheelchair 50 feet with 2 turns activity    Assist        Assist Level: Minimal Assistance - Patient > 75%   Wheelchair 150 feet activity     Assist      Assist Level: Minimal Assistance - Patient > 75%   Blood pressure (!) 135/94, pulse 77, temperature 98.3 F (36.8 C), temperature source Oral, resp. rate 18, height 5\' 11"  (1.803  m), weight 97.1 kg, SpO2 94 %.   Medical Problem List and Plan: 1.  Altered mental status with dysarthric speech secondary to small left thalamic hemorrhage in setting of Eliquis coagulopathy  Continue CIR 2.  Antithrombotics: -DVT/anticoagulation: SCDs             -antiplatelet therapy: N/A 3. Pain Management: Tylenol as needed 4. Mood: Provide emotional support             -antipsychotic agents: N/A 5. Neuropsych: This patient he is capable of making decisions on his own behalf. 6. Skin/Wound Care: Routine skin checks 7. Fluids/Electrolytes/Nutrition: Routine I/Os. 8.  Hypertension.  Continue Cardizem 240 mg daily, hydralazine 25 mg twice daily, Maxide 1 tablet daily.  Monitor with increased mobility Vitals:   05/04/20 1939 05/05/20 0338  BP: (!) 143/77 (!) 135/94  Pulse: 79 77  Resp: 18   Temp: 98.2 F (36.8 C) 98.3 F (36.8 C)  SpO2: 96% 94%   Relatively controlled on 1/1 9.  Atrial fibrillation.  Continue Cardizem.  Cardiac rate controlled.  Eliquis discontinued due to thalamic hemorrhage 10.  Hyperlipidemia.  Zetia 11.  Hypokalemia- Likely related to maxide will supplement  K+ 3.4 on 12/31, labs ordered for Monday 12. Hyponatremia  Na+ 129 on 12/31, labs ordered for Monday 13. AKI  Cr. 1.27 on 12/31, labs ordered for Monday  Encourage fluids 14. Constipation  Bowel meds increased on 1/1    LOS: 2 days A FACE TO FACE EVALUATION WAS PERFORMED  Ario Mcdiarmid Lorie Phenix 05/05/2020, 9:47 AM

## 2020-05-05 NOTE — Progress Notes (Signed)
Physical Therapy Session Note  Patient Details  Name: Steven Meadows MRN: [id number] Date of Birth: 1931-08-12  Today's Date: 05/05/2020 PT Individual Time: 1102-1200 PT Individual Time Calculation (min): 58 min   Short Term Goals: Week 1:  PT Short Term Goal 1 (Week 1): STG=LTG due to ELOS  Skilled Therapeutic Interventions/Progress Updates:   Pt received supine in bed and agreeable to PT. Supine>sit transfer with distant supervision assist with cues for decreased use of bed rails.    Stand pivot transfer to [facility] with min assist due to mild posterior bias and pt attempting to pull on RW. Pt able to perform all additional transfers with supervision assist following cues to push from sitting surface.   Gait training with RW x 210 ft, CGA-Spervision assist from PT.  Additional gait training with SPC x 122ft and CGA from PT. Pt noted to have increased step length and cadence with RW vs SPC.  Dynamic gait training with RW and SPC to weave through 4 cones with supervision assist using RW and CGA with SPC, no overt LOB, but gait variations as noted above present. PT encouraged pt to utilize RW upon d/c to improve safety and reduce fall risk.  10 meter walk test. 0.40 m/s gait speed  (<0.8 indicates increased fall risk and decreased community mobility.   PT instructed pt in TUG:  29.2sec (average of 3 trials; >13.5 sec indicates increased fall risk)  Patient demonstrates increased fall risk as noted by score of   31/56 on Berg Balance Scale.  (<36= high risk for falls, close to 100%; 37-45 significant >80%; 46-51 moderate >50%; 52-55 lower >25%)        Therapy Documentation Precautions:  Precautions Precautions: Fall,Other (comment) Precaution Comments: HOH, STM impairment Restrictions Weight Bearing Restrictions: No Vital Signs: Therapy Vitals Temp: 97.9 F (36.6 C) Temp Source: Oral Pulse Rate: 73 Resp: 18 BP: 119/62 Patient Position (if appropriate): Lying Oxygen Therapy SpO2:  94 % O2 Device: Room Air O2 Flow Rate (L/min): 0 L/min Pain:   denies Balance: Standardized Balance Assessment Standardized Balance Assessment: Berg Balance Test;Timed Up and Go Test Berg Balance Test Sit to Stand: Able to stand  independently using hands Standing Unsupported: Able to stand 2 minutes with supervision Sitting with Back Unsupported but Feet Supported on Floor or Stool: Able to sit safely and securely 2 minutes Stand to Sit: Controls descent by using hands Transfers: Able to transfer safely, definite need of hands Standing Unsupported with Eyes Closed: Able to stand 10 seconds with supervision Standing Ubsupported with Feet Together: Able to place feet together independently and stand for 1 minute with supervision From Standing, Reach Forward with Outstretched Arm: Can reach forward >12 cm safely (5") From Standing Position, Pick up Object from Floor: Unable to try/needs assist to keep balance From Standing Position, Turn to Look Behind Over each Shoulder: Turn sideways only but maintains balance Turn 360 Degrees: Needs close supervision or verbal cueing Standing Unsupported, Alternately Place Feet on Step/Stool: Needs assistance to keep from falling or unable to try Standing Unsupported, One Foot in Front: Able to take small step independently and hold 30 seconds Standing on One Leg: Tries to lift leg/unable to hold 3 seconds but remains standing independently Total Score: 31 Timed Up and Go Test TUG: Normal TUG Normal TUG (seconds): 29.2 (with RW average of 3 trials ( 33.7, 26.9, 27.0))    Therapy/Group: Individual Therapy  Golden Pop 05/05/2020, 5:35 PM

## 2020-05-05 NOTE — Progress Notes (Signed)
Physical Therapy Session Note  Patient Details  Name: DEMARCUS THIELKE MRN: [id number] Date of Birth: [birth year]  Today's Date: 05/05/2020 PT Individual Time: 1025-8527 PT Individual Time Calculation (min): 61 min   Short Term Goals: Week 1:  PT Short Term Goal 1 (Week 1): STG=LTG due to ELOS  Skilled Therapeutic Interventions/Progress Updates:    Pt received supine in bed with his wife, Lanora Manis, present and pt agreeable to therapy session. Supine>sitting L EOB, HOB flat but bedrails available, with supervision. Sit>stand EOB>RW with CGA for steadying and delayed hip/knee extension to achieve upright. Gait training ~15ft to main therapy gym using RW with CGA for steadying - demos decreased gait speed with intermittent shuffling that improves with cuing for reciprocal pattern - repeated cuing for improved upright posture. Dynamic standing balance task via cross body reaching from low to high surface to place clothespins on basketball goal - wide BOS and CGA/min assist for steadying. Dynamic standing balance task, no UE support, attempting to perform alternate foot taps on 4" step but pt with freezing/shuffled type attempts with increased postural sway and fear of falling therefore transitioned to repeated L LE then R LE foot taps with cuing for sequencing of trunk/hip extension and weight shift onto stance limb then able to progress to alternating foot taps with significant improvement in balance and motor planning. Dynamic gait in // bars via lateral side stepping with B UE support and pt initially turning towards side and stepping really quickly requiring cuing to slow down and remain facing sideways for increased safety and increased hip abductor activation. Dynamic gait in // bars via backwards walking with B UE support progress to only 1 UE support with CGA for steadying and cuing for reciprocal stepping. Therapist adjusted height of RW for improved fit. Gait training ~254ft back to room using RW with  CGA for steadying and repeated cuing for increased hip extension to improve upright posture and reciprocal stepping pattern with increased gait speed. With encouragement pt agreeable to remain sitting up in w/c - left with needs in reach, seat belt alarm on, meal tray set-up and pt's wife present.  Therapy Documentation Precautions:  Precautions Precautions: Fall,Other (comment) Precaution Comments: HOH, STM impairment Restrictions Weight Bearing Restrictions: No  Pain: No reports of pain throughout session.   Therapy/Group: Individual Therapy  Ginny Forth , PT, DPT, CSRS  05/05/2020, 12:21 PM

## 2020-05-05 NOTE — Progress Notes (Signed)
Occupational Therapy Session Note  Patient Details  Name: Steven Meadows MRN: [id number] Date of Birth: 06/14/1931  Today's Date: 05/05/2020 OT Individual Time: 1300-1411 OT Individual Time Calculation (min): 71 min   Short Term Goals: Week 1:  OT Short Term Goal 1 (Week 1): STG = LTG 2/2 ELOS  Skilled Therapeutic Interventions/Progress Updates:    Pt greeted in bed, just finishing up lunch and appearing bright and jovial. He politely declined participation in bathing/dressing tasks, agreeable to go outdoors for tx. Pt completed bed mobility unassisted with HOB elevated using the bedrail. CGA for short distance ambulatory transfer to the w/c using RW. He was then escorted to an outdoor hospital setting for psychosocial health. While outside, pt worked on [employer] by engaging in UB exercises using the orange tband x10 reps 2 sets each exercise and also while self propelling the w/c over uneven concrete and up/down inclines. CGA for ambulation outdoors using RW to work on higher level balance, vcs for forward gaze and Rt foot clearance with pt able to ambulate ~100 ft bouts before needing seated rest. Pt was then returned to the unit via w/c. CGA for ambulatory transfer to toilet, toileting tasks (+bladder void) and for handwashing while standing at the sink. Vcs for proper walker placement during handwashing. Pt then returned to bed and was left with all needs within reach and bed alarm set.   Therapy Documentation Precautions:  Precautions Precautions: Fall,Other (comment) Precaution Comments: HOH, STM impairment Restrictions Weight Bearing Restrictions: No Vital Signs: Therapy Vitals Temp: 97.9 F (36.6 C) Temp Source: Oral Pulse Rate: 73 Resp: 18 BP: 119/62 Patient Position (if appropriate): Lying Oxygen Therapy SpO2: 94 % O2 Device: Room Air O2 Flow Rate (L/min): 0 L/min Pain: pt reported having no pain during tx   ADL: ADL Eating: Contact guard Where Assessed-Eating:  Edge of bed Grooming: Contact guard Where Assessed-Grooming: Edge of bed Upper Body Bathing: Contact guard Where Assessed-Upper Body Bathing: Edge of bed Lower Body Bathing: Minimal assistance Where Assessed-Lower Body Bathing: Edge of bed Upper Body Dressing: Contact guard Where Assessed-Upper Body Dressing: Edge of bed Lower Body Dressing: Moderate assistance Where Assessed-Lower Body Dressing: Edge of bed Toileting: Moderate assistance Where Assessed-Toileting: Teacher, adult education: Furniture conservator/restorer Method: Proofreader: Acupuncturist: Administrator, arts Method: Ambulating   Therapy/Group: Individual Therapy  Steven Meadows 05/05/2020, 3:30 PM

## 2020-05-06 MED ORDER — POLYETHYLENE GLYCOL 3350 17 G PO PACK
17.0000 g | PACK | Freq: Two times a day (BID) | ORAL | Status: DC
  Administered 2020-05-06 – 2020-05-12 (×8): 17 g via ORAL
  Filled 2020-05-06 (×16): qty 1

## 2020-05-06 MED ORDER — SENNOSIDES-DOCUSATE SODIUM 8.6-50 MG PO TABS
2.0000 | ORAL_TABLET | Freq: Two times a day (BID) | ORAL | Status: DC
  Administered 2020-05-06 – 2020-05-10 (×7): 2 via ORAL
  Filled 2020-05-06 (×10): qty 2

## 2020-05-06 NOTE — Plan of Care (Signed)
  Problem: Consults Goal: RH STROKE PATIENT EDUCATION Description: See Patient Education module for education specifics  Outcome: Progressing   Problem: RH SAFETY Goal: RH STG ADHERE TO SAFETY PRECAUTIONS W/ASSISTANCE/DEVICE Description: STG Adhere to Safety Precautions With  cues/reminders Assistance/Device. Outcome: Progressing   Problem: RH KNOWLEDGE DEFICIT Goal: RH STG INCREASE KNOWLEDGE OF HYPERTENSION Outcome: Progressing Goal: RH STG INCREASE KNOWLEGDE OF HYPERLIPIDEMIA Description: Patient will be able to manage HLD with medications and dietary modifications using handouts and educational materials with cues, reminders Outcome: Progressing Goal: RH STG INCREASE KNOWLEDGE OF STROKE PROPHYLAXIS Description: Patient will be able to manage secondary stroke risks with medications and dietary modifications using handouts and educational materials with cues, reminders Outcome: Progressing   

## 2020-05-06 NOTE — Progress Notes (Signed)
[facility] PHYSICAL MEDICINE & REHABILITATION PROGRESS NOTE   Subjective/Complaints: Patient seen laying in bed this morning.  He states he slept well overnight.  He states he still not have a bowel movement yesterday.  ROS: + Constipation.  Denies CP, SOB, N/V/D  Objective:   No results found. Recent Labs    05/04/20 0434  WBC 6.5  HGB 15.8  HCT 45.0  PLT 180   Recent Labs    05/04/20 0434  NA 129*  K 3.4*  CL 95*  CO2 23  GLUCOSE 102*  BUN 18  CREATININE 1.27*  CALCIUM 9.5    Intake/Output Summary (Last 24 hours) at 05/06/2020 1412 Last data filed at 05/06/2020 1357 Gross per 24 hour  Intake 960 ml  Output 1100 ml  Net -140 ml        Physical Exam: Vital Signs Blood pressure 124/79, pulse (!) 59, temperature (!) 97.5 F (36.4 C), temperature source Oral, resp. rate 17, height 5\' 11"  (1.803 m), weight 97.1 kg, SpO2 96 %.  Constitutional: No distress . Vital signs reviewed. HENT: Normocephalic.  Atraumatic. Eyes: EOMI. No discharge. Cardiovascular: No JVD.  Irregularly irregular. Respiratory: Normal effort.  No stridor.  Bilateral clear to auscultation. GI: Non-distended.  BS +. Skin: Warm and dry.  Intact. Psych: Normal mood.  Normal behavior. Musc: No edema in extremities.  No tenderness in extremities. Neuro: Alert Motor: 5/5 throughout, stable  Assessment/Plan: 1. Functional deficits which require 3+ hours per day of interdisciplinary therapy in a comprehensive inpatient rehab setting.  Physiatrist is providing close team supervision and 24 hour management of active medical problems listed below.  Physiatrist and rehab team continue to assess barriers to discharge/monitor patient progress toward functional and medical goals  Care Tool:  Bathing    Body parts bathed by patient: Face,Right arm,Left arm,Chest,Abdomen,Front perineal area,Right upper leg,Left upper leg,Right lower leg,Left lower leg   Body parts bathed by helper: Buttocks      Bathing assist Assist Level: Minimal Assistance - Patient > 75%     Upper Body Dressing/Undressing Upper body dressing   What is the patient wearing?: Hospital gown only,Pull over shirt    Upper body assist Assist Level: Contact Guard/Touching assist    Lower Body Dressing/Undressing Lower body dressing      What is the patient wearing?: Underwear/pull up,Pants     Lower body assist Assist for lower body dressing: Moderate Assistance - Patient 50 - 74%     Toileting Toileting    Toileting assist Assist for toileting: Contact Guard/Touching assist     Transfers Chair/bed transfer  Transfers assist     Chair/bed transfer assist level: Contact Guard/Touching assist Chair/bed transfer assistive device:   Ambulation assist      Assist level: Contact Guard/Touching assist Assistive device: Walker-rolling Max distance: 271ft   Walk 10 feet activity   Assist     Assist level: Contact Guard/Touching assist Assistive device: Walker-rolling   Walk 50 feet activity   Assist    Assist level: Contact Guard/Touching assist Assistive device: Walker-rolling    Walk 150 feet activity   Assist Walk 150 feet activity did not occur: Safety/medical concerns  Assist level: Contact Guard/Touching assist Assistive device: Walker-rolling    Walk 10 feet on uneven surface  activity   Assist Walk 10 feet on uneven surfaces activity did not occur: Safety/medical concerns         Wheelchair     Assist   Type of  Wheelchair: Agricultural engineer assist level: Minimal Assistance - Patient > 75% Max wheelchair distance: 150    Wheelchair 50 feet with 2 turns activity    Assist        Assist Level: Minimal Assistance - Patient > 75%   Wheelchair 150 feet activity     Assist      Assist Level: Minimal Assistance - Patient > 75%   Blood pressure 124/79, pulse (!) 59, temperature (!) 97.5 F (36.4 C),  temperature source Oral, resp. rate 17, height 5\' 11"  (1.803 m), weight 97.1 kg, SpO2 96 %.   Medical Problem List and Plan: 1.  Altered mental status with dysarthric speech secondary to small left thalamic hemorrhage in setting of Eliquis coagulopathy  Continue CIR 2.  Antithrombotics: -DVT/anticoagulation: SCDs             -antiplatelet therapy: N/A 3. Pain Management: Tylenol as needed 4. Mood: Provide emotional support             -antipsychotic agents: N/A 5. Neuropsych: This patient he is capable of making decisions on his own behalf. 6. Skin/Wound Care: Routine skin checks 7. Fluids/Electrolytes/Nutrition: Routine I/Os. 8.  Hypertension.  Continue Cardizem 240 mg daily, hydralazine 25 mg twice daily, Maxide 1 tablet daily.  Monitor with increased mobility Vitals:   05/06/20 0851 05/06/20 1355  BP: 124/78 124/79  Pulse: 64 (!) 59  Resp:  17  Temp:  (!) 97.5 F (36.4 C)  SpO2:  96%   Controlled on 2/1 9.  Atrial fibrillation.  Continue Cardizem.  Cardiac rate controlled.  Eliquis discontinued due to thalamic hemorrhage 10.  Hyperlipidemia.  Zetia 11.  Hypokalemia- Likely related to maxide will supplement  K+ 3.4 on 12/31, labs ordered for tomorrow 12. Hyponatremia  Na+ 129 on 12/31, labs ordered for tomorrow 13. AKI  Cr. 1.27 on 12/31, labs ordered for tomorrow  Encourage fluids 14. Constipation  Bowel meds increased on 1/1, again on 1/2    LOS: 3 days A FACE TO FACE EVALUATION WAS PERFORMED  Ankit Lorie Phenix 05/06/2020, 2:12 PM

## 2020-05-07 ENCOUNTER — Inpatient Hospital Stay (HOSPITAL_COMMUNITY): Payer: Medicare Other | Admitting: Occupational Therapy

## 2020-05-07 ENCOUNTER — Inpatient Hospital Stay (HOSPITAL_COMMUNITY): Payer: Medicare Other

## 2020-05-07 LAB — BASIC METABOLIC PANEL
Anion gap: 12 (ref 5–15)
BUN: 16 mg/dL (ref 8–23)
CO2: 24 mmol/L (ref 22–32)
Calcium: 9.7 mg/dL (ref 8.9–10.3)
Chloride: 96 mmol/L — ABNORMAL LOW (ref 98–111)
Creatinine, Ser: 1.09 mg/dL (ref 0.61–1.24)
GFR, Estimated: >60 mL/min (ref >60)
Glucose, Bld: 98 mg/dL (ref 70–99)
Potassium: 3.7 mmol/L (ref 3.5–5.1)
Sodium: 132 mmol/L — ABNORMAL LOW (ref 135–145)

## 2020-05-07 NOTE — Progress Notes (Signed)
Physical Therapy Session Note  Patient Details  Name: Steven Meadows MRN: [id number] Date of Birth: 1932-01-05  Today's Date: 05/07/2020 PT Individual Time: 0800-0900 PT Individual Time Calculation (min): 60 min   Short Term Goals: Week 1:  PT Short Term Goal 1 (Week 1): STG=LTG due to ELOS  Skilled Therapeutic Interventions/Progress Updates:    Pt greeted supine in bed, awake and agreeable to PT session. Reports no pain. Performed supine<>sit with supervision with HOB flat but use of bed rail. Able to sit EOB with supervision as well as scooting along EOB with supervision. Attempted to perform stand<>pivot transfer with no AD however had x1 large posterior LOB while turning resulting in abrupt sitting back to EOB. During 2nd effort, therapist provided increased verbal cueing for general sequencing, hand placement, and safety awareness as well as increased forward weight shift and this improved his transfer, required CGA for completing. W/c transport to main therapy gym with totalA for time management. Performed gait training, ambulating ~122ft with CGA and RW, providing cues for erect posture due to his forward truncal tendencies but pt reports this is premorbid in nature. After seated rest, performed stair training, negotiating up/down x4 steps with CGA and BUE support to 1 hand rail and side-stepping up/down. Pt reports this is baseline strategy for navigating stairs. Performed repeated sit<>stands from lowered mat table height, completing with CGA and no AD. He is very slow to rise during these transfers and has difficulty producing power in BLE's as well as initiating B hip extension. Performed dyanmic balance training with cone taps with vs without UE support. Without UE support, he required modA for standing balance but with UE support, he was able to complete with CGA. Then completed the following there-ex in both seated and standing: -2x25 seated rotations with 2kg med ball  -2x10 standing  upward reach with 2kg med ball and minA guard -2x10 standing chop/lift patterns with 2kg med ball and minA guard -1x10 standing hip abduction with BUE support on // bar -1x10 standing hamstring curls with BUE support on // bar -1x10 standing hip marches with BUE support on // bar -1x10 standing heel raises with BUE support on // bar  After a brief seated rest break, he ambulated back to his room, >213ft, with close supervision and RW. Stand<>sit with CGA and RW to EOB with cues for controlled lowering. Sit>supine with supervision without bed features. He remained supine in bed at end of session with needs in reach and bed alarm on.  Therapy Documentation Precautions:  Precautions Precautions: Fall,Other (comment) Precaution Comments: HOH, STM impairment Restrictions Weight Bearing Restrictions: No  Therapy/Group: Individual Therapy   Steven Meadows PT 05/07/2020, 8:21 AM

## 2020-05-07 NOTE — Progress Notes (Signed)
[place] PHYSICAL MEDICINE & REHABILITATION PROGRESS NOTE   Subjective/Complaints:   No issues overnite, pt states he used walker prior to hospitalization due to Bilateral foot numbness /peripheral neuropathy   ROS: + Constipation.  Denies CP, SOB, N/V/D  Objective:   No results found. No results for input(s): WBC, HGB, HCT, PLT in the last 72 hours. Recent Labs    05/07/20 0641  NA 132*  K 3.7  CL 96*  CO2 24  GLUCOSE 98  BUN 16  CREATININE 1.09  CALCIUM 9.7    Intake/Output Summary (Last 24 hours) at 05/07/2020 0932 Last data filed at 05/07/2020 0700 Gross per 24 hour  Intake 600 ml  Output 1850 ml  Net -1250 ml        Physical Exam: Vital Signs Blood pressure 138/86, pulse 66, temperature (!) 97.5 F (36.4 C), temperature source Oral, resp. rate 17, height 5\' 11"  (1.803 m), weight 97.1 kg, SpO2 97 %.   General: No acute distress Mood and affect are appropriate Heart: Regular rate and rhythm no rubs murmurs or extra sounds Lungs: Clear to auscultation, breathing unlabored, no rales or wheezes Abdomen: Positive bowel sounds, soft nontender to palpation, nondistended Extremities: No clubbing, cyanosis, or edema Skin: No evidence of breakdown, no evidence of rash   Skin: Warm and dry.  Intact. Psych: Normal mood.  Normal behavior. Musc: No edema in extremities.  No tenderness in extremities. Neuro: Alert Motor: 5/5 throughout, stable  Assessment/Plan: 1. Functional deficits which require 3+ hours per day of interdisciplinary therapy in a comprehensive inpatient rehab setting.  Physiatrist is providing close team supervision and 24 hour management of active medical problems listed below.  Physiatrist and rehab team continue to assess barriers to discharge/monitor patient progress toward functional and medical goals  Care Tool:  Bathing    Body parts bathed by patient: Face,Right arm,Left arm,Chest,Abdomen,Front perineal area,Right upper leg,Left upper  leg,Right lower leg,Left lower leg   Body parts bathed by helper: Buttocks     Bathing assist Assist Level: Minimal Assistance - Patient > 75%     Upper Body Dressing/Undressing Upper body dressing   What is the patient wearing?: Hospital gown only,Pull over shirt    Upper body assist Assist Level: Contact Guard/Touching assist    Lower Body Dressing/Undressing Lower body dressing      What is the patient wearing?: Underwear/pull up,Pants     Lower body assist Assist for lower body dressing: Moderate Assistance - Patient 50 - 74%     Toileting Toileting    Toileting assist Assist for toileting: Contact Guard/Touching assist     Transfers Chair/bed transfer  Transfers assist     Chair/bed transfer assist level: Contact Guard/Touching assist Chair/bed transfer assistive device:   Ambulation assist      Assist level: Contact Guard/Touching assist Assistive device: Walker-rolling Max distance: 234ft   Walk 10 feet activity   Assist     Assist level: Contact Guard/Touching assist Assistive device: Walker-rolling   Walk 50 feet activity   Assist    Assist level: Contact Guard/Touching assist Assistive device: Walker-rolling    Walk 150 feet activity   Assist Walk 150 feet activity did not occur: Safety/medical concerns  Assist level: Contact Guard/Touching assist Assistive device: Walker-rolling    Walk 10 feet on uneven surface  activity   Assist Walk 10 feet on uneven surfaces activity did not occur: Safety/medical concerns         Wheelchair  Assist   Type of Wheelchair: Manual    Wheelchair assist level: Minimal Assistance - Patient > 75% Max wheelchair distance: 150    Wheelchair 50 feet with 2 turns activity    Assist        Assist Level: Minimal Assistance - Patient > 75%   Wheelchair 150 feet activity     Assist      Assist Level: Minimal Assistance - Patient >  75%   Blood pressure 138/86, pulse 66, temperature (!) 97.5 F (36.4 C), temperature source Oral, resp. rate 17, height 5\' 11"  (1.803 m), weight 97.1 kg, SpO2 97 %.   Medical Problem List and Plan: 1.  Altered mental status with dysarthric speech secondary to small left thalamic hemorrhage in setting of Eliquis coagulopathy  Continue CIR PT, OT, SLP  2.  Antithrombotics: -DVT/anticoagulation: SCDs             -antiplatelet therapy: N/A 3. Pain Management: Tylenol as needed 4. Mood: Provide emotional support             -antipsychotic agents: N/A 5. Neuropsych: This patient he is capable of making decisions on his own behalf. 6. Skin/Wound Care: Routine skin checks 7. Fluids/Electrolytes/Nutrition: Routine I/Os. 8.  Hypertension.  Continue Cardizem 240 mg daily, hydralazine 25 mg twice daily, Maxide 1 tablet daily.  Monitor with increased mobility Vitals:   05/06/20 1946 05/07/20 0431  BP: 124/86 138/86  Pulse: 66 66  Resp:    Temp: 98.3 F (36.8 C) (!) 97.5 F (36.4 C)  SpO2: 97% 97%   Controlled on 2/1 9.  Atrial fibrillation.  Continue Cardizem.  Cardiac rate controlled.  Eliquis discontinued due to thalamic hemorrhage 10.  Hyperlipidemia.  Zetia 11.  Hypokalemia- Likely related to maxide will supplement  K+ normal at 3.7 cont KCL  12. Hyponatremia Improving 1/3 13. AKI  Resolved creat 1.09 1/3 14. Constipation  Bowel meds increased on 1/1, again on 1/2-monitor effect     LOS: 4 days A FACE TO FACE EVALUATION WAS PERFORMED  Charlett Blake 05/07/2020, 9:32 AM

## 2020-05-07 NOTE — Progress Notes (Signed)
Occupational Therapy Session Note  Patient Details  Name: Steven Meadows MRN: [id number] Date of Birth: 28-Apr-1932  Today's Date: 05/07/2020 OT Individual Time: 0950-1100 and 1255-1355 OT Individual Time Calculation (min): 70 min and 60 min  Short Term Goals: Week 1:  OT Short Term Goal 1 (Week 1): STG = LTG 2/2 ELOS  Skilled Therapeutic Interventions/Progress Updates:    Pt greeted in bed with no c/o pain, agreeable to shower today. Supine<sit from flat bed without bedrail use, per setup at home, completed with setup assistance. He was agreeable to attempt toileting before shower, transferred to toilet using RW with supervision assist, supervision for clothing mgt with pt having continent bladder void. He then doffed clothing while seated on toilet with cuing, used the RW to transfer into the shower. Pt bathed at sit<stand level with min cues for sequencing, assistance for washing both feet, and CGA for dynamic standing balance. CGA for transfer out of shower and then he dressed w/c level sit<stand at the sink. Note that after pt donned his underwear he attempted to ambulate back to bed (without RW). Reminded pt that he was not done dressing yet and he resumed with dressing tasks given supervision-Min assistance. Pt required increased time to problem solve when both LEs were threaded into the same pant hole. Supervision/setup oral care and grooming tasks. He then ambulated with the RW to the dayroom and circled back to his room, verbal cues to use external aides to pathfind way back to the room. Pt transferred to bed and was left with all needs within reach and bed alarm set. Tx focus placed on functional transfers, dynamic standing balance, functional cognition, and ADL retraining.   2nd Session 1:1 tx (60 min) Pt greeted in bed, just finished lunch and ready for tx. No c/o pain. Pt completed bed mobility unassisted with HOB raised and using the bedrail. Supervision for ambulatory transfer to the  therapy apartment using RW, vcs for forward gaze, upright posture, standing closer to device, and Rt foot clearance. He transferred to the couch with supervision. Pt explained to OT his method for transferring into his walk-in shower at home. Per pt, he used the RW and held onto a metal pole and the soap dish while sidestepping over the threshold, also lightly used some suction grab bars. OT verbalized and demonstrated safer back-up method to the simulated walk-in shower. Pt completed transfer this way using the RW with close supervision-CGA assist. Reiterated to pt that this shower transfer method was safer and the method recommended by OT for home. Pt ambulated back to room and transferred to the toilet with supervision. Supervision for toileting tasks with pt requiring significantly increased time to complete perihygiene post large BM void. He reports he usually transfers into the shower post large BMs at home. After handwashing, pt returned to bed and was left with all needs within reach and bed alarm set.   Therapy Documentation Precautions:  Precautions Precautions: Fall,Other (comment) Precaution Comments: HOH, STM impairment Restrictions Weight Bearing Restrictions: No ADL: ADL Eating: Contact guard Where Assessed-Eating: Edge of bed Grooming: Contact guard Where Assessed-Grooming: Edge of bed Upper Body Bathing: Contact guard Where Assessed-Upper Body Bathing: Edge of bed Lower Body Bathing: Minimal assistance Where Assessed-Lower Body Bathing: Edge of bed Upper Body Dressing: Contact guard Where Assessed-Upper Body Dressing: Edge of bed Lower Body Dressing: Moderate assistance Where Assessed-Lower Body Dressing: Edge of bed Toileting: Moderate assistance Where Assessed-Toileting: Teacher, adult education: Furniture conservator/restorer Method: Freight forwarder  Equipment: Acupuncturist: Administrator, arts Method: Ambulating       Therapy/Group: Individual Therapy  Steven Meadows 05/07/2020, 12:16 PM

## 2020-05-08 ENCOUNTER — Inpatient Hospital Stay (HOSPITAL_COMMUNITY): Payer: Medicare Other | Admitting: Physical Therapy

## 2020-05-08 ENCOUNTER — Inpatient Hospital Stay (HOSPITAL_COMMUNITY): Payer: Medicare Other | Admitting: Occupational Therapy

## 2020-05-08 MED ORDER — HYDRALAZINE HCL 25 MG PO TABS
25.0000 mg | ORAL_TABLET | Freq: Three times a day (TID) | ORAL | Status: DC
  Administered 2020-05-08 – 2020-05-15 (×22): 25 mg via ORAL
  Filled 2020-05-08 (×21): qty 1

## 2020-05-08 NOTE — Progress Notes (Signed)
Physical Therapy Session Note  Patient Details  Name: Steven Meadows MRN: [id number] Date of Birth: 06-13-1931  Today's Date: 05/08/2020 PT Individual Time: 9628-3662 PT Individual Time Calculation (min): 70 min   Short Term Goals: Week 1:  PT Short Term Goal 1 (Week 1): STG=LTG due to ELOS   Skilled Therapeutic Interventions/Progress Updates:    Patient supine in bed upon PT arrival. Patient alert and agreeable to PT session. Patient denied pain throughout session.  Therapeutic Activity: Bed Mobility: Patient performed supine to/from sit with supervision/ stand-by asst and extra time. VC provided to complete until square at EOB.  Transfers: Patient performed sit to/from stand throughout session using RW with supervision. Provided minimal verbal cues for technique especially with hand placement during return to sitting.  Gait Training:  Patient ambulated >200 feet using RW with supervision/ standby asst for balance. Ambulation performed with noted forward flexion, decreased step length, and decreased B LE foot clearance. Provided verbal cues for upright posture, increasing step length/ height  Neuromuscular Re-ed: NMR facilitated for improved balance and coordination during static and dynamic activities. Guided pt in gait training through obstacle course using RW including stepping onto Airex pad with focus on balance with self perturbation in arm movements for changes in center of mass, stepping over items with correct advancement of RW, stepping over and onto 4" step, and around cones. Pt demos good balance throughout with largest difficulty maintaining balance on Airex pad and requiring up to Min/ Mod asst to maintain.   Facilitated static progressed to dynamic reaching activity using 2lb weighted bar and reaching targets from shoulder level to knee level at variable distances. Pt performs with heavy CGA and light Min A to maintain balance but demos improvement with increased self  confidence. Reaching activity progressed in distance requiring dynamic step forward and back with each reach. Up to Min A provided for balance. Dynamic stepping activity with HHA into forward and lateral lunges with vc required for improved technique and increased LE muscle activation. Pt follows instructions well and improves again with cueing as well as increased confidence and self efficacy.   Therapeutic Exercise: Patient guided in supine stretch on mat table with focus on hip flexor lengthening. Stretch held x2 for each LE.   Patient seated in w/c at end of session with brakes locked, seat belt alarm set, and all needs within reach.  Therapy Documentation Precautions:  Precautions Precautions: Fall,Other (comment) Precaution Comments: HOH, STM impairment Restrictions Weight Bearing Restrictions: No    Therapy/Group: Individual Therapy  Loel Dubonnet, PT Casimiro Needle, PT, DPT, CSRS 05/08/2020, 4:43 PM

## 2020-05-08 NOTE — Progress Notes (Signed)
Physical Therapy Session Note  Patient Details  Name: Steven Meadows MRN: [id number] Date of Birth: 05-10-1931  Today's Date: 05/08/2020 PT Individual Time: 2297-9892 PT Individual Time Calculation (min): 41 min   Short Term Goals: Week 1:  PT Short Term Goal 1 (Week 1): STG=LTG due to ELOS  Skilled Therapeutic Interventions/Progress Updates:    Pt received supine in bed and agreeable to therapy session. Supine>sitting L EOB, HOB flat and not using bedrails, with supervision and increased time for trunk upright. Re-discussed having pt's wife bring in other tennis shoes and pt reports they discussed it this afternoon - still having to wear dress shoes at this time. Sit<>stands using RW with close supervision throughout session - continues to demo delayed hip/knee extension with difficulty producing power in LEs. Gait training ~149ft to main therapy gym using RW with CGA/close supervision for safety - repeated cuing for upright posture, improved B LE foot clearance (noticed to shuffle more in dress shoes), and increased step length for improved heel strike as noted to land on midfoot. Sit>supine on mat with supervision. Initiated bridging; however, every time pt lifted hips from mat had onset of L vs R hamstring cramp and unable to continue. Performed supine B LE hamstring stretch 2x1 minute each but pt reports still feels that a cramp is going to occur in R LE so unable to perform bridges. Supine>sitting L EOM as described above to EOB. Sitting EOM performed B LE hamstring stretches for pt education to perform at home 1x23min each LE with cuing for proper form/technique. Standing overhead reaching task to "dunk" ball into basketball goal focusing on improved upright trunk posture with increased hip/knee extension with CGA for steadying. Gait training ~64ft, no AD, with min assist for balance - demos wider BOS and continued shuffling gait pattern and excessive trunk/hip flexion as with AD. Pt reports he is  afraid if he stands up too tall he will fall backwards - had pt demonstrate what he feels is "up tall" and he was having excessive trunk extension - had pt stand sideways and look in mirror to relearn where midline is located with poor postural awareness. Gait training ~169ft back to room using RW with continued CGA to close supervision - demos improving B LE foot clearance and step length with decreased shuffle and improving upright posture. Despite encouragement and education on health benefits of sitting upright in chair pt declines, but is agreeable to use the "chair" feature of bed. Pt left sitting in "chair" position of bed with needs in reach and bed alarm on.   Therapy Documentation Precautions:  Precautions Precautions: Fall,Other (comment) Precaution Comments: HOH, STM impairment Restrictions Weight Bearing Restrictions: No  Pain:   No reports of pain throughout session.   Therapy/Group: Individual Therapy  Ginny Forth , PT, DPT, CSRS  05/08/2020, 7:49 AM

## 2020-05-08 NOTE — Plan of Care (Signed)
  Problem: Consults Goal: RH STROKE PATIENT EDUCATION Description: See Patient Education module for education specifics  Outcome: Progressing   Problem: RH SAFETY Goal: RH STG ADHERE TO SAFETY PRECAUTIONS W/ASSISTANCE/DEVICE Description: STG Adhere to Safety Precautions With  cues/reminders Assistance/Device. Outcome: Progressing   Problem: RH KNOWLEDGE DEFICIT Goal: RH STG INCREASE KNOWLEDGE OF HYPERTENSION Outcome: Progressing Goal: RH STG INCREASE KNOWLEGDE OF HYPERLIPIDEMIA Description: Patient will be able to manage HLD with medications and dietary modifications using handouts and educational materials with cues, reminders Outcome: Progressing Goal: RH STG INCREASE KNOWLEDGE OF STROKE PROPHYLAXIS Description: Patient will be able to manage secondary stroke risks with medications and dietary modifications using handouts and educational materials with cues, reminders Outcome: Progressing   

## 2020-05-08 NOTE — Progress Notes (Signed)
Occupational Therapy Session Note  Patient Details  Name: Steven Meadows MRN: [id number] Date of Birth: [birth year]  Today's Date: 05/08/2020 OT Individual Time: 8003-4917 OT Individual Time Calculation (min): 72 min    Short Term Goals: Week 1:  OT Short Term Goal 1 (Week 1): STG = LTG 2/2 ELOS   Skilled Therapeutic Interventions/Progress Updates:    Pt greeted at time of session supine in bed semireclined resting with wife present in room. No pain. Pt and wife had several questions regarding ELOS, correct footwear for therapy sessions, and CLOF and questions were answered based off most recent documentation as this is this therapists' first time working with this pt. Pt and wife very pleasant, wanting to take pt home soon and educated he is CGA overall with Min A occasionally. Supine to sit supervision with bed features and walked short distance to sink level with CGA/close supervision with RW and stood to remove shirt and shave face for grooming tasks for approx 15-20 mins to shave face. Declined ADL as he had just showered yesterday and put on clean clothes this am. Transported dependently for time management to ADL apartment and performed walk in shower with posterior entry method after therapist demonstration, pt still attempting to enter shower forward, requiring cues to enter posteriorly despite therapist demonstration. Performed shower transfer with CGA and continued to discuss safety tips and techniques for showering at home. Pt will need shower chair, wife aware that it can be purchased via [facility] or hospital. Pt returned to room dependently via wheelchair, stand pivot back to bed close supervision with RW. Sit to supine Supervision, alarm on call bell in reach. Note cognitive deficit for safety at times, decreased STM with shower transfer, and difficulty word finding at times.   Therapy Documentation Precautions:  Precautions Precautions: Fall,Other (comment) Precaution Comments: HOH,  STM impairment Restrictions Weight Bearing Restrictions: No     Therapy/Group: Individual Therapy  Erasmo Score 05/08/2020, 3:29 PM

## 2020-05-08 NOTE — Progress Notes (Signed)
[place] PHYSICAL MEDICINE & REHABILITATION PROGRESS NOTE   Subjective/Complaints:  No pains, had a good breakfast discussed his diabetic neuropathy   ROS: + Constipation.  Denies CP, SOB, N/V/D  Objective:   No results found. No results for input(s): WBC, HGB, HCT, PLT in the last 72 hours. Recent Labs    05/07/20 0641  NA 132*  K 3.7  CL 96*  CO2 24  GLUCOSE 98  BUN 16  CREATININE 1.09  CALCIUM 9.7    Intake/Output Summary (Last 24 hours) at 05/08/2020 0841 Last data filed at 05/08/2020 0641 Gross per 24 hour  Intake 600 ml  Output 2110 ml  Net -1510 ml        Physical Exam: Vital Signs Blood pressure (!) 145/92, pulse 74, temperature 98 F (36.7 C), resp. rate 17, height 5\' 11"  (1.803 m), weight 97.1 kg, SpO2 98 %.    General: No acute distress Mood and affect are appropriate Heart: Regular rate and rhythm no rubs murmurs or extra sounds Lungs: Clear to auscultation, breathing unlabored, no rales or wheezes Abdomen: Positive bowel sounds, soft nontender to palpation, nondistended Extremities: No clubbing, cyanosis, or edema Skin: No evidence of breakdown, no evidence of rash Psych: Normal mood.  Normal behavior. Musc: No edema in extremities.  No tenderness in extremities. Neuro: Alert Motor: 5/5 throughout, stable Sensation reduce proprio> LT in L>R LE although cooperated better on left side was moving the right toes during testing   Assessment/Plan: 1. Functional deficits which require 3+ hours per day of interdisciplinary therapy in a comprehensive inpatient rehab setting.  Physiatrist is providing close team supervision and 24 hour management of active medical problems listed below.  Physiatrist and rehab team continue to assess barriers to discharge/monitor patient progress toward functional and medical goals  Care Tool:  Bathing    Body parts bathed by patient: Face,Right arm,Left arm,Chest,Abdomen,Front perineal area,Right upper leg,Left  upper leg,Buttocks   Body parts bathed by helper: Right lower leg,Left lower leg     Bathing assist Assist Level: Minimal Assistance - Patient > 75%     Upper Body Dressing/Undressing Upper body dressing   What is the patient wearing?: Pull over shirt    Upper body assist Assist Level: Supervision/Verbal cueing    Lower Body Dressing/Undressing Lower body dressing      What is the patient wearing?: Underwear/pull up,Pants     Lower body assist Assist for lower body dressing: Minimal Assistance - Patient > 75%     Toileting Toileting    Toileting assist Assist for toileting: Supervision/Verbal cueing     Transfers Chair/bed transfer  Transfers assist     Chair/bed transfer assist level: Contact Guard/Touching assist Chair/bed transfer assistive device:   Ambulation assist      Assist level: Contact Guard/Touching assist Assistive device: Walker-rolling Max distance: 272ft   Walk 10 feet activity   Assist     Assist level: Contact Guard/Touching assist Assistive device: Walker-rolling   Walk 50 feet activity   Assist    Assist level: Contact Guard/Touching assist Assistive device: Walker-rolling    Walk 150 feet activity   Assist Walk 150 feet activity did not occur: Safety/medical concerns  Assist level: Contact Guard/Touching assist Assistive device: Walker-rolling    Walk 10 feet on uneven surface  activity   Assist Walk 10 feet on uneven surfaces activity did not occur: Safety/medical concerns         Wheelchair     Assist  Will patient use wheelchair at discharge?: Yes (Per PT long term goals) Type of Wheelchair: Manual    Wheelchair assist level: Minimal Assistance - Patient > 75% Max wheelchair distance: 150    Wheelchair 50 feet with 2 turns activity    Assist        Assist Level: Minimal Assistance - Patient > 75%   Wheelchair 150 feet activity     Assist       Assist Level: Minimal Assistance - Patient > 75%   Blood pressure (!) 145/92, pulse 74, temperature 98 F (36.7 C), resp. rate 17, height 5\' 11"  (1.803 m), weight 97.1 kg, SpO2 98 %.   Medical Problem List and Plan: 1.  Altered mental status with dysarthric speech secondary to small left thalamic hemorrhage in setting of Eliquis coagulopathy  Continue CIR PT, OT, SLP team conf in am  2.  Antithrombotics: -DVT/anticoagulation: SCDs             -antiplatelet therapy: N/A 3. Pain Management: Tylenol as needed 4. Mood: Provide emotional support             -antipsychotic agents: N/A 5. Neuropsych: This patient he is capable of making decisions on his own behalf. 6. Skin/Wound Care: Routine skin checks 7. Fluids/Electrolytes/Nutrition: Routine I/Os. 8.  Hypertension.  Continue Cardizem 240 mg daily, hydralazine 25 mg twice daily, Maxide 1 tablet daily.  Monitor with increased mobility Vitals:   05/07/20 1947 05/08/20 0338  BP: (!) 144/99 (!) 145/92  Pulse: 74 74  Resp:    Temp: 97.8 F (36.6 C) 98 F (36.7 C)  SpO2: 94% 98%   Elevated will increase hydralazine to q 8h 9.  Atrial fibrillation.  Continue Cardizem.  Cardiac rate controlled.  Eliquis discontinued due to thalamic hemorrhage 10.  Hyperlipidemia.  Zetia 11.  Hypokalemia- Likely related to maxide will supplement  K+ normal at 3.7 cont KCL  12. Hyponatremia Improving 1/3 13. AKI  Resolved creat 1.09 1/3 14. Constipation  Bowel meds increased on 1/1, again on 1/2-monitor effect     LOS: 5 days A FACE TO FACE EVALUATION WAS PERFORMED  Charlett Blake 05/08/2020, 8:41 AM

## 2020-05-09 ENCOUNTER — Inpatient Hospital Stay (HOSPITAL_COMMUNITY): Payer: Medicare Other | Admitting: Physical Therapy

## 2020-05-09 ENCOUNTER — Inpatient Hospital Stay (HOSPITAL_COMMUNITY): Payer: Medicare Other

## 2020-05-09 NOTE — Progress Notes (Signed)
Physical Therapy Session Note  Patient Details  Name: Steven Meadows MRN: [id number] Date of Birth: Aug 26, 1931  Today's Date: 05/09/2020 PT Individual Time: 1620-1700 PT Individual Time Calculation (min): 40 min   Short Term Goals: Week 1:  PT Short Term Goal 1 (Week 1): STG=LTG due to ELOS  Skilled Therapeutic Interventions/Progress Updates:    Pt received seated in bed, agreeable to PT session. No complaints of pain. Pt performs bed mobility at Supervision level. Sit to stand with Supervision throughout session. Ambulation x 150 ft with use of RW at Supervision level, cues for upright posture and to stay closer to RW. Trial ambulation with use of rollator as pt utilizes this in the yard outside at home. Pt is initially CGA with use of rollator progressing to Supervision level up to 150 ft with cues to keep rollator closer to him during gait. Pt exhibits increase in path deviation with use of rollator as compared to RW. Ambulation across uneven surfaces with RW and rollator with CGA needed for balance in order to simulate home environment. Pt returned to bed at end of session, Supervision for bed mobility. Pt left semi-reclined in bed with needs in reach, bed alarm in place.  Therapy Documentation Precautions:  Precautions Precautions: Fall,Other (comment) Precaution Comments: HOH, STM impairment Restrictions Weight Bearing Restrictions: No   Therapy/Group: Individual Therapy   Peter Congo, PT, DPT  05/09/2020, 5:58 PM

## 2020-05-09 NOTE — Progress Notes (Signed)
Patient ID: Steven Meadows, male   DOB: 09-05-1931, 87 y.o.   MRN: [id number] Team Conference Report to Patient/Family  Team Conference discussion was reviewed with the patient and caregiver, including goals, any changes in plan of care and target discharge date.  Patient and caregiver express understanding and are in agreement.  The patient has a target discharge date of 05/15/20.  Andria Rhein 05/09/2020, 2:05 PM

## 2020-05-09 NOTE — Progress Notes (Signed)
Physical Therapy Session Note  Patient Details  Name: Steven Meadows MRN: [id number] Date of Birth: 07-20-1931  Today's Date: 05/09/2020 PT Individual Time: 1120-1205 PT Individual Time Calculation (min): 45 min   Short Term Goals: Week 1:  PT Short Term Goal 1 (Week 1): STG=LTG due to ELOS  Skilled Therapeutic Interventions/Progress Updates: Pt presented in bed agreeable to therapy. Pt denies pain during session. Performed bed mobility with supervision with increased effort due to pt being in lower position in bed. Pt donned loafer shoes with set up. Performed STS and ambulated to rehab gym with RW and supervision overall. Pt participated in balance activities primarily on compliant surface. Required mod A for STS on Airex but was CGA overall for Airex activities. Participated in static stand on Airex with shoulder flexion to 90, 2 bouts of 30 sec, and static stand with mild perturbations. Pt also participated in block building activities while standing on Airex. Pt required modA for orientation of blocks and cues to use both hands, however no LOB. Pt then attempted toe taps to cones without AD but with HHA. Pt was able to successfully perform with LLE however required modA with RLE and ultimately performed with R hand stabilized on RW. After brief rest pt ambulated back to room with RW and supervision with intermittent verbal cues to increase step length. Pt requesting to return to bed at end of session. Performed sit to supine with supervision and use of bed features however pt unaware how low he was in the bed and unable to problem solve how to correct. With verbal cues pt was able to pull self up to long sit and perform posterior scoot towards HOB. Pt left in bed at end of session with bed alarm on, call bell within reach and needs met.      Therapy Documentation Precautions:  Precautions Precautions: Fall,Other (comment) Precaution Comments: HOH, STM impairment Restrictions Weight Bearing  Restrictions: No General:   Vital Signs:  Pain: Pain Assessment Pain Scale: 0-10 Pain Score: 0-No pain   Therapy/Group: Individual Therapy  Nikhil Osei  Johnte Portnoy, PTA  05/09/2020, 12:17 PM

## 2020-05-09 NOTE — Progress Notes (Signed)
[facility] PHYSICAL MEDICINE & REHABILITATION PROGRESS NOTE   Subjective/Complaints:  No issues overnite except HA relieved by tylenol ROS: + Constipation.  Denies CP, SOB, N/V/D  Objective:   No results found. No results for input(s): WBC, HGB, HCT, PLT in the last 72 hours. Recent Labs    05/07/20 0641  NA 132*  K 3.7  CL 96*  CO2 24  GLUCOSE 98  BUN 16  CREATININE 1.09  CALCIUM 9.7    Intake/Output Summary (Last 24 hours) at 05/09/2020 0920 Last data filed at 05/09/2020 1696 Gross per 24 hour  Intake 950 ml  Output 1375 ml  Net -425 ml        Physical Exam: Vital Signs Blood pressure (!) 152/81, pulse 64, temperature 97.6 F (36.4 C), temperature source Oral, resp. rate 16, height 5' 11"  (1.803 m), weight 98.5 kg, SpO2 98 %.     General: No acute distress Mood and affect are appropriate Heart: Regular rate and rhythm no rubs murmurs or extra sounds Lungs: Clear to auscultation, breathing unlabored, no rales or wheezes Abdomen: Positive bowel sounds, soft nontender to palpation, nondistended Extremities: No clubbing, cyanosis, or edema Skin: No evidence of breakdown, no evidence of rash  Psych: Normal mood.  Normal behavior. Musc: No edema in extremities.  No tenderness in extremities. Neuro: Alert Motor: 5/5 throughout, stable Sensation reduce proprio> LT in L>R LE although cooperated better on left side was moving the right toes during testing   Assessment/Plan: 1. Functional deficits which require 3+ hours per day of interdisciplinary therapy in a comprehensive inpatient rehab setting.  Physiatrist is providing close team supervision and 24 hour management of active medical problems listed below.  Physiatrist and rehab team continue to assess barriers to discharge/monitor patient progress toward functional and medical goals  Care Tool:  Bathing    Body parts bathed by patient: Face,Right arm,Left arm,Chest,Abdomen,Front perineal area,Right upper  leg,Left upper leg,Buttocks   Body parts bathed by helper: Right lower leg,Left lower leg     Bathing assist Assist Level: Minimal Assistance - Patient > 75%     Upper Body Dressing/Undressing Upper body dressing   What is the patient wearing?: Pull over shirt    Upper body assist Assist Level: Supervision/Verbal cueing    Lower Body Dressing/Undressing Lower body dressing      What is the patient wearing?: Underwear/pull up,Pants     Lower body assist Assist for lower body dressing: Minimal Assistance - Patient > 75%     Toileting Toileting    Toileting assist Assist for toileting: Supervision/Verbal cueing     Transfers Chair/bed transfer  Transfers assist     Chair/bed transfer assist level: Supervision/Verbal cueing Chair/bed transfer assistive device: Programmer, multimedia   Ambulation assist      Assist level: Supervision/Verbal cueing Assistive device: Walker-rolling Max distance: 120f   Walk 10 feet activity   Assist     Assist level: Supervision/Verbal cueing Assistive device: Walker-rolling   Walk 50 feet activity   Assist    Assist level: Supervision/Verbal cueing Assistive device: Walker-rolling    Walk 150 feet activity   Assist Walk 150 feet activity did not occur: Safety/medical concerns  Assist level: Supervision/Verbal cueing Assistive device: Walker-rolling    Walk 10 feet on uneven surface  activity   Assist Walk 10 feet on uneven surfaces activity did not occur: Safety/medical concerns         Wheelchair     Assist Will patient use wheelchair  at discharge?: No Type of Wheelchair: Manual    Wheelchair assist level: Minimal Assistance - Patient > 75% Max wheelchair distance: 150    Wheelchair 50 feet with 2 turns activity    Assist        Assist Level: Minimal Assistance - Patient > 75%   Wheelchair 150 feet activity     Assist      Assist Level: Minimal Assistance -  Patient > 75%   Blood pressure (!) 152/81, pulse 64, temperature 97.6 F (36.4 C), temperature source Oral, resp. rate 16, height 5' 11"  (1.803 m), weight 98.5 kg, SpO2 98 %.   Medical Problem List and Plan: 1.  Altered mental status with dysarthric speech secondary to small left thalamic hemorrhage in setting of Eliquis coagulopathy  Continue CIR PT, OT, SLP Team conference today please see physician documentation under team conference tab, met with team  to discuss problems,progress, and goals. Formulized individual treatment plan based on medical history, underlying problem and comorbidities.  2.  Antithrombotics: -DVT/anticoagulation: SCDs             -antiplatelet therapy: N/A 3. Pain Management: Tylenol as needed 4. Mood: Provide emotional support             -antipsychotic agents: N/A 5. Neuropsych: This patient he is capable of making decisions on his own behalf. 6. Skin/Wound Care: Routine skin checks 7. Fluids/Electrolytes/Nutrition: Routine I/Os. 8.  Hypertension.  Continue Cardizem 240 mg daily, hydralazine 25 mg twice daily, Maxide 1 tablet daily.  Monitor with increased mobility Vitals:   05/08/20 1919 05/09/20 0332  BP: 110/67 (!) 152/81  Pulse: 66 64  Resp: 17 16  Temp: (!) 97.4 F (36.3 C) 97.6 F (36.4 C)  SpO2: 97% 98%   Elevated will increase hydralazine to q 8h- diastolic controlled cont to monityor on current dose  9.  Atrial fibrillation.  Continue Cardizem.  Cardiac rate controlled.  Eliquis discontinued due to thalamic hemorrhage 10.  Hyperlipidemia.  Zetia 11.  Hypokalemia- Likely related to maxide will supplement  K+ normal at 3.7 cont KCL  12. Hyponatremia Improving 1/3 13. AKI  Resolved creat 1.09 1/3 14. Constipation  Bowel meds increased on 1/1, again on 1/2-monitor effect     LOS: 6 days A FACE TO FACE EVALUATION WAS PERFORMED  Charlett Blake 05/09/2020, 9:20 AM

## 2020-05-09 NOTE — Progress Notes (Signed)
Occupational Therapy Session Note  Patient Details  Name: Steven Meadows MRN: [id number] Date of Birth: 12-Feb-1932  Today's Date: 05/09/2020 OT Individual Time: 1404-1501 OT Individual Time Calculation (min): 57 min    Short Term Goals: Week 1:  OT Short Term Goal 1 (Week 1): STG = LTG 2/2 ELOS  Skilled Therapeutic Interventions/Progress Updates:    Session 1 ([id number]): Pt received semi-reclined in bed with environmental services present, agreeable to therapy. Denies pain this session. Session focus on standing dynamic balance + activity tolerance. Pt completed bed mobility with close S, donned B shoes EOB with set-up A. Sit > stand with RW with close S + min VCs to push up from bed. Amb to therapy kitchen with RW with close S. Seated rest break on apartment couch, req CGA for sit>stand with RW from low surface. Gathered appropriate items to make oatmeal in kitchen with RW + close S, made microwave oatmeal with close S + min VCs for sequencing task. Then cleaned up dishes and placed in dishwasher with overall CGA when bending down. Reviewed and practiced strategies for transporting items in kitchen with RW, pt will most likely be using his rollator at home. Amb to dayroom to focus on standing balance. In standing, crossed midline to place and retrieve objects from knee to head height with RW + CGA. Attempted dynamic standing activity without AD, pt had episode of anterior LOB req min A to correct. Ambulated back to room, doffed shoes, and completed bed mobility with close S. Left semi-reclined in bed, call bell in reach, bed alarm engaged. Noted difficulty this session with word finding, req mod VCs to push up from chair during sit<>stand. Denies significant fatigue throughout session.   Session 2 [phone]): Pt received semi-reclined in bed with wife present, agreeable to therapy. Denies pain this session. Reviewed CLOF with wife and estimated DC date, wife with no further questions at this time.  Session focus on dynamic standing balance + activity tolerance + AE for LB dressing. Pt completed bed mobility with close S from flat bed, donned B shoes with set-up A, Sit > stand and amb to ortho gym with RW + close S. Demonstrated improvement from morning session to push up from chair without VCs. In gym, worked on various activities to improve dynamic standing/seated balance + activity tolerance for improved standing ADL performance: seated on mat, pt caught and threw ball from outside BOS, then transitioned to standing with RW to reach for target outside BOS on B sides, finally played several rounds of horse shoes with RW + overall CGA. No noted LOB. Pt picked up horse shoe from ground with CGA + use of RW. Amb back to room same manner as above. Doffed shoes with close S, demonstrated and trialed use of sock aid to don L sock with min A to adjust sock 2/2 time. Will cont to trial during sessions, but pt typically does not wear socks with his loafers. Pt left semi-reclined in bed, call bell in reach, bed alarm engaged, all immediate needs met.    Therapy Documentation Precautions:  Precautions Precautions: Fall,Other (comment) Precaution Comments: HOH, STM impairment Restrictions Weight Bearing Restrictions: No  Pain: Pain Assessment Pain Scale: 0-10 Pain Score: 0-No pain ADL: See Care Tool for more details.  Therapy/Group: Individual Therapy  Volanda Napoleon, MS, OTR/L 05/09/2020, 10:02 AM

## 2020-05-09 NOTE — Progress Notes (Signed)
Inpatient Rehabilitation Care Coordinator Assessment and Plan Patient Details  Name: Steven Meadows MRN: [id number] Date of Birth: March 08, 1932  Today's Date: 05/09/2020  Hospital Problems: Principal Problem:   Thalamic hemorrhage (HCC) Active Problems:   Hyponatremia   Hypokalemia   AKI (acute kidney injury) (HCC)   Benign essential HTN   Slow transit constipation  Past Medical History:  Past Medical History:  Diagnosis Date  . Abdominal aortic atherosclerosis (HCC)   . Actinic keratoses   . 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 Atrial fibrillation (HCC)   . Cough   . History of mononucleosis   . HTN (hypertension)   . Hypercholesteremia   . Lacunar stroke (HCC)   . Low back pain   . Olecranon bursitis   . Rotator cuff tear   . Stroke [facility])    Past Surgical History:  Past Surgical History:  Procedure Laterality Date  . CATARACT EXTRACTION--right eye  2013  . TONSILLECTOMY AND ADENOIDECTOMY     Social History:  reports that he has never smoked. He has never used smokeless tobacco. He reports current alcohol use. He reports that he does not use drugs.  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 Retired: 61   Abuse/Neglect Abuse/Neglect Assessment Can Be Completed: Yes Physical Abuse: Denies Verbal Abuse: Denies Sexual Abuse: Denies Exploitation of patient/patient's resources: Denies Self-Neglect: Denies  Emotional Status Recent Psychosocial Issues: no Psychiatric History: no Substance Abuse History: no  Patient / Family Perceptions, Expectations & Goals Premorbid pt/family roles/activities: Independent, gardening Anticipated changes in roles/activities/participation: Some  assistance Pt/family expectations/goals: MOD I to supervision  [employer]: None Premorbid Home Care/DME Agencies: Other (Comment) (Grab bars,Cane, Rollater, Agricultural consultant) Transportation available at discharge: Family able to [employer] referrals recommended: Neuropsychology (Coping)  Discharge Planning Living Arrangements: Spouse/significant other Type of Residence: Private residence (2 level home, able to live on main with B&B. 2 steps to enter) Insurance Resources: Electrical engineer Resources: Restaurant manager, fast food Screen Referred: No Living Expenses: Own Money Management: Patient,Spouse Does the patient have any problems obtaining your medications?: No Home Management: Independent Patient/Family Preliminary Plans: Some assistance Care Coordinator Anticipated Follow Up Needs: HH/OP Expected length of stay: 7-10 Days  Clinical Impression SW followed up with spouse no questions or concerns.  Andria Rhein 05/09/2020, 11:12 AM

## 2020-05-09 NOTE — Patient Care Conference (Signed)
Inpatient RehabilitationTeam Conference and Plan of Care Update Date: 05/09/2020   Time: 10:12 AM    Patient Name: Steven Meadows      Medical Record Number: [id number]  Date of Birth: May 13, 1931 Sex: Male         Room/Bed: 4W03C/4W03C-01 Payor Info: Payor: MEDICARE / Plan: MEDICARE PART A AND B / Product Type: *No Product type* /    Admit Date/Time:  05/03/2020  5:49 PM  Primary Diagnosis:  Thalamic hemorrhage [facility])  Hospital Problems: Principal Problem:   Thalamic hemorrhage ([place]) Active Problems:   Hyponatremia   Hypokalemia   AKI (acute kidney injury) ([place])   Benign essential HTN   Slow transit constipation    Expected Discharge Date: Expected Discharge Date: 05/15/20  Team Members Present: Physician leading conference: Dr. Alysia Penna Care Coodinator Present: Erlene Quan, BSW;Cambrey Lupi Hervey Ard, RN, BSN, [place] Nurse Present: Rayne Du, LPN PT Present: Phylliss Bob, PTA OT Present: Leretha Pol, OT PPS Coordinator present : Gunnar Fusi, Novella Olive, PT     Current Status/Progress Goal Weekly Team Focus  Bowel/Bladder   continent B/B, last BM1/4  maintain continens  assess q shift and PRN   Swallow/Nutrition/ Hydration             ADL's   UB bathing: S; UB dressing: S; LB bathing: min A; LB dressing: min A; Toilet Transfer: S Toileting: S; Shower Transfer: CGA Self Feeding/Grooming: S/set-up  mod I  selfcare retraining, balance retraining, activity tolerance, pt/fam edu, DME/AD edu, functional transfer retraining   Mobility   supervision bed mobility, supervision sit<>stand and stand pivot transfers using RW, CGA/supervision gait up to 265ft using RW, ascended/descended 4 steps using 1 HR with CGA  mod-I to supervision overall at ambulatory level  dynamic standing balance, gait training, stair navigation, activity tolerance, B LE strengthening, pt/family education   Communication             Safety/Cognition/ Behavioral Observations             Pain   pain 3of 10 -headache  pain<3 of 10  assess q shift and PRN   Skin   intact  maintain intact skin  assess q shift and PRN     Discharge Planning:      Team Discussion: Diabetic neuropathy PTA with decline over the past several years prior to stroke. Left thalamic hemorrhage without neurologic issues except for balance. MD adjusting BP medications . Patient is continent of bowel and bladder Patient on target to meet rehab goals: yes, currently supervision -CGA overall with CGA for steps and  Mod I - supervision goals set for discharge  *See Care Plan and progress notes for long and short-term goals.   Revisions to Treatment Plan:   Teaching Needs: Transfers, toileting, medications, secondary stroke risk management, etc.  Current Barriers to Discharge: Decreased caregiver support  Possible Resolutions to Barriers:  Family education    Medical Summary Current Status: uncontrolled hypertension, HA x 1 last noc  Barriers to Discharge: Medical stability   Possible Resolutions to [employer] Focus: may need further BP med management prior to discharge   Continued Need for Acute Rehabilitation Level of Care: The patient requires daily medical management by a physician with specialized training in physical medicine and rehabilitation for the following reasons: Direction of a multidisciplinary physical rehabilitation program to maximize functional independence : Yes Medical management of patient stability for increased activity during participation in an intensive rehabilitation regime.: Yes Analysis of laboratory values and/or radiology reports  with any subsequent need for medication adjustment and/or medical intervention. : Yes   I attest that I was present, lead the team conference, and concur with the assessment and plan of the team.   Chana Bode B 05/09/2020, 1:33 PM

## 2020-05-10 ENCOUNTER — Inpatient Hospital Stay (HOSPITAL_COMMUNITY): Payer: Medicare Other

## 2020-05-10 ENCOUNTER — Inpatient Hospital Stay (HOSPITAL_COMMUNITY): Payer: Medicare Other | Admitting: Physical Therapy

## 2020-05-10 NOTE — Progress Notes (Signed)
Physical Therapy Session Note  Patient Details  Name: Steven Meadows MRN: [id number] Date of Birth: 29-Aug-1931  Today's Date: 05/10/2020 PT Individual Time: 1301-1415 PT Individual Time Calculation (min): 74 min   Short Term Goals: Week 1:  PT Short Term Goal 1 (Week 1): STG=LTG due to ELOS Week 2:     Skilled Therapeutic Interventions/Progress Updates:    Patient supine in bed upon PT arrival into room. Patient alert and agreeable to PT session. Patient denied pain throughout session.  Therapeutic Activity: Bed Mobility: Patient performed supine --> sit EOB with Min A mostly for UB. Provided verbal cues for hand placement and abdominal activation. Return to supine performed with standby assist and vc again for hand placement as well as for BLE assist for positioning toward HOB. Transfers: Patient performed sit to/from stand throughout session various surfaces and heights <> rollator with supervision with few instances of CGA from lower height surfaces. Provided verbal cues for anterior weight shift, application of rollator brakes, and foot placement.  Gait Training:  Patient ambulated >300 feet using rollator with supervision. VC provided for upright posture, level gaze, increasing step height and length. Pt is able to improve posture and gaze with vc, however, pt tends to scuff heels of shoes on floor throughout. Pt also demos good control of rollator through cones placed 4' apart requiring good balance, weight shifting and attention to task. He is able to complete with no missteps or knocking any cones.   Neuromuscular Re-ed: NMR facilitated throughout session with focus on dynamic and static standing activities. He is guided in dynamic stepping lunges AP and laterally for each direction, dynamic reaching to targets placed in and outside of BOS as well as between head and knee level for with one seated rest break. Slight bobble while performing reaching activity with no hands and  requires Min A and one hand support to take a step and maintain balance. Statically, he is guided in standing self perturbations on floor as well as on Airex pad using 2lb and 4lb weights held in hands and moved in AP directions at chest level as well as passing weight overhead R<>L for 8 reps. Pt is also able to withstand and maintain balance through unexpected drop of weight into outstretched hands for 8 reps at shoulder, chest, and waist heights. Static weight shifting performed with focus on toe and heel weight shifting.  Pt does well with all NM focused activities and he demos improved self-efficacy that builds within each exercise. Continues to require at least one handed support to complete any dynamic stepping activity safely.    Patient supine in bed at end of session with bed alarm set, and all needs within reach to pt's L side. IP Administrator entering room at end of session.    Therapy Documentation Precautions:  Precautions Precautions: Fall,Other (comment) Precaution Comments: HOH, STM impairment Restrictions Weight Bearing Restrictions: No General:   Vital Signs: Therapy Vitals Pulse Rate: 84 Resp: 16 BP: 111/66 Patient Position (if appropriate): Lying Oxygen Therapy SpO2: 96 % O2 Device: Room Air    Therapy/Group: Individual Therapy  Loel Dubonnet 05/10/2020, 3:03 PM

## 2020-05-10 NOTE — Progress Notes (Signed)
[place] PHYSICAL MEDICINE & REHABILITATION PROGRESS NOTE   Subjective/Complaints:  No issues overnite except HA relieved by tylenol ROS: + Constipation.  Denies CP, SOB, N/V/D  Objective:   No results found. No results for input(s): WBC, HGB, HCT, PLT in the last 72 hours. No results for input(s): NA, K, CL, CO2, GLUCOSE, BUN, CREATININE, CALCIUM in the last 72 hours.  Intake/Output Summary (Last 24 hours) at 05/10/2020 0849 Last data filed at 05/10/2020 0655 Gross per 24 hour  Intake 360 ml  Output 1375 ml  Net -1015 ml        Physical Exam: Vital Signs Blood pressure 108/70, pulse (!) 58, temperature 97.7 F (36.5 C), resp. rate 17, height 5\' 11"  (1.803 m), weight 98.5 kg, SpO2 94 %.     General: No acute distress Mood and affect are appropriate Heart: Regular rate and rhythm no rubs murmurs or extra sounds Lungs: Clear to auscultation, breathing unlabored, no rales or wheezes Abdomen: Positive bowel sounds, soft nontender to palpation, nondistended Extremities: No clubbing, cyanosis, or edema Skin: No evidence of breakdown, no evidence of rash  Psych: Normal mood.  Normal behavior. Musc: No edema in extremities.  No tenderness in extremities. Neuro: Alert Motor: 5/5 throughout, stable Sensation reduce proprio> LT in L>R LE although cooperated better on left side was moving the right toes during testing   Assessment/Plan: 1. Functional deficits which require 3+ hours per day of interdisciplinary therapy in a comprehensive inpatient rehab setting.  Physiatrist is providing close team supervision and 24 hour management of active medical problems listed below.  Physiatrist and rehab team continue to assess barriers to discharge/monitor patient progress toward functional and medical goals  Care Tool:  Bathing    Body parts bathed by patient: Face,Right arm,Left arm,Chest,Abdomen,Front perineal area,Right upper leg,Left upper leg,Buttocks   Body parts bathed by  helper: Right lower leg,Left lower leg     Bathing assist Assist Level: Minimal Assistance - Patient > 75%     Upper Body Dressing/Undressing Upper body dressing   What is the patient wearing?: Pull over shirt    Upper body assist Assist Level: Supervision/Verbal cueing    Lower Body Dressing/Undressing Lower body dressing      What is the patient wearing?: Underwear/pull up,Pants     Lower body assist Assist for lower body dressing: Minimal Assistance - Patient > 75%     Toileting Toileting    Toileting assist Assist for toileting: Supervision/Verbal cueing     Transfers Chair/bed transfer  Transfers assist     Chair/bed transfer assist level: Supervision/Verbal cueing Chair/bed transfer assistive device:   Ambulation assist      Assist level: Supervision/Verbal cueing Assistive device: Walker-rolling Max distance: 150'   Walk 10 feet activity   Assist     Assist level: Supervision/Verbal cueing Assistive device: Walker-rolling   Walk 50 feet activity   Assist    Assist level: Supervision/Verbal cueing Assistive device: Walker-rolling    Walk 150 feet activity   Assist Walk 150 feet activity did not occur: Safety/medical concerns  Assist level: Supervision/Verbal cueing Assistive device: Walker-rolling    Walk 10 feet on uneven surface  activity   Assist Walk 10 feet on uneven surfaces activity did not occur: Safety/medical concerns         Wheelchair     Assist Will patient use wheelchair at discharge?: No Type of Wheelchair: Manual    Wheelchair assist level: Minimal Assistance - Patient > 75%  Max wheelchair distance: 150    Wheelchair 50 feet with 2 turns activity    Assist        Assist Level: Minimal Assistance - Patient > 75%   Wheelchair 150 feet activity     Assist      Assist Level: Minimal Assistance - Patient > 75%   Blood pressure 108/70, pulse (!) 58,  temperature 97.7 F (36.5 C), resp. rate 17, height 5\' 11"  (1.803 m), weight 98.5 kg, SpO2 94 %.   Medical Problem List and Plan: 1.  Altered mental status with dysarthric speech secondary to small left thalamic hemorrhage in setting of Eliquis coagulopathy  Continue CIR PT, OT, SLP Pt aware an dcomfortable with d/c date 1/11 Plan is to use RW in house and rollator outdoors   2.  Antithrombotics: -DVT/anticoagulation: SCDs             -antiplatelet therapy: N/A 3. Pain Management: Tylenol as needed 4. Mood: Provide emotional support             -antipsychotic agents: N/A 5. Neuropsych: This patient he is capable of making decisions on his own behalf. 6. Skin/Wound Care: Routine skin checks 7. Fluids/Electrolytes/Nutrition: Routine I/Os. 8.  Hypertension.  Continue Cardizem 240 mg daily, hydralazine 25 mg twice daily, Maxide 1 tablet daily.  Monitor with increased mobility Vitals:   05/09/20 2003 05/10/20 0327  BP: 121/72 108/70  Pulse: 75 (!) 58  Resp: 18 17  Temp: 98.1 F (36.7 C) 97.7 F (36.5 C)  SpO2: 100% 94%   Elevated will increase hydralazine to q 8h- diastolic controlled cont to monityor on current dose  9.  Atrial fibrillation.  Continue Cardizem.  Cardiac rate controlled.  Eliquis discontinued due to thalamic hemorrhage 10.  Hyperlipidemia.  Zetia 11.  Hypokalemia- Likely related to maxide will supplement  K+ normal at 3.7 cont KCL  12. Hyponatremia Improving 1/3 13. AKI  Resolved creat 1.09 1/3 14. Constipation  Bowel meds increased on 1/1, again on 1/2-monitor effect     LOS: 7 days A FACE TO FACE EVALUATION WAS PERFORMED  Steven Meadows 05/10/2020, 8:49 AM

## 2020-05-10 NOTE — Progress Notes (Signed)
Occupational Therapy Session Note  Patient Details  Name: Steven Meadows MRN: [id number] Date of Birth: [birth year]  Today's Date: 05/10/2020 OT Individual Time: 1100-1201 OT Individual Time Calculation (min): 61 min    Short Term Goals: Week 1:  OT Short Term Goal 1 (Week 1): STG = LTG 2/2 ELOS  Skilled Therapeutic Interventions/Progress Updates:     Session 1 (0370-9643): Pt received semi-reclined in bed, agreeable to therapy. Session focus on showering + dressing. Denies pain this session. Pt completed bed mobility, sit>stand and amb to TTB with overall close S, had one small anterior LOB when crossing bathroom threshold, req CGA as pt self-corrected. Completes TTB transfer with close S + min VCs for sequencing. Bathed UB/LB with close S. Amb to bed to get dressed with close S + RW. Completed UB + LB dressing + donned B shoes with close S  + use of RW for sit<>stand. Then tidied up room drawers with personal items with RW + close S in prep for DC home. Transitioned back to bed same manner as before. Confirmed bathroom set-up with patient and rec DME (shower chair, 3in1). Reviewed where to purchase shower chair, but will need to confirm with wife as pt is not sure if she is getting one or not. Pt left with HOB elevated, with bed alarm engaged, call bell in reach, eating breakfast and all immediate needs met.   Session 2 (1100-1201): Pt received semi-reclined in bed, agreeable to therapy. Denies pain. Session focus on static/dynamic standing balance + activity tolerance for improved ADL performance.  Pt completed bed mobility + donned B shoes with distant S. Sit > stand with RW + amb to gym with close S + mod VCs to maintain upright posture and to avoid obstacles in hallway. Seated on mat, caught and threw ball outside of BOS with distant S for ~ 5 min for improved dynamic sitting balance. Then transitioned to dynobalance, pt completed both weight shifting and posture stability games. With BUE  supported, pt req close S to maintain center of gravity, with 0 BUE supported, req CGA. Overall min VCs + TCs to maintain upright posture as pt noted with mild kyphotic posture. Pt req CGA to maintain center of gravity when standing on foam mat. Transitioned to therapy apartment, simulated walk-in shower transfer with wooden lip with overall close S + min VCs for safe RW use. Will attempt to simulate with wife present in future sessions. Amb back to room with RW , doffed B shoes seated EOB, and completed bed mobility with close S. No noted LOB this session. Pt left semi-reclined with bed alarm engaged, call bell in reach, and all immediate needs met.    Therapy Documentation Precautions:  Precautions Precautions: Fall,Other (comment) Precaution Comments: HOH, STM impairment Restrictions Weight Bearing Restrictions: No Pain: Pain Assessment Pain Scale: 0-10 Pain Score: 0-No pain ADL: See Care Tool for more details.  Therapy/Group: Individual Therapy  Volanda Napoleon, MS, OTR/L 05/10/2020, 8:54 AM

## 2020-05-11 ENCOUNTER — Inpatient Hospital Stay (HOSPITAL_COMMUNITY): Payer: Medicare Other

## 2020-05-11 ENCOUNTER — Inpatient Hospital Stay (HOSPITAL_COMMUNITY): Payer: Medicare Other | Admitting: Physical Therapy

## 2020-05-11 MED ORDER — SENNOSIDES-DOCUSATE SODIUM 8.6-50 MG PO TABS: 2.0000 | ORAL_TABLET | Freq: Every evening | ORAL | Status: DC | PRN

## 2020-05-11 NOTE — Progress Notes (Signed)
Patient ID: Steven Meadows, male   DOB: [birth year], 87 y.o.   MRN: [id number]   Bedside Commode and Shower chair ordered through [employer]. HH options for follow up provided to pt/spouse.  [place], [place]

## 2020-05-11 NOTE — Progress Notes (Signed)
Physical Therapy Session Note  Patient Details  Name: Steven Meadows MRN: [id number] Date of Birth: [birth year]  Today's Date: 05/11/2020 PT Individual Time: [phone]   62 min   Short Term Goals: Week 1:  PT Short Term Goal 1 (Week 1): STG=LTG due to ELOS  Skilled Therapeutic Interventions/Progress Updates:      Therapy Documentation Precautions:  Precautions Precautions: Fall,Other (comment) Precaution Comments: HOH, STM impairment Restrictions Weight Bearing Restrictions: No General:   Patient supine in bed upon PT arrival. Patient alert and agreeable to PT session. Patient denied pain throughout session. Pt has socks and tennis shoes available this session and these are donned with Max A for time.   Therapeutic Activity: Bed Mobility: Patient performed supine to/from sit with supervision and extra time to complete. No VC provided. Transfers: Patient performed sit to/from stand throughout session as well as in blocked practice x5 in order to improve weight shifting and technique. Pt instructed in anterior weight shift as well as push from armrests to continue to shift weight forward as he tends to push straight up from chair. Technique and weight shifting improved by end of practice.   Therapeutic Activity: Facilitated pt in NuStep activity for 53min on L4 for body warm up as well as increasing ROM in all BLE major joints for improving step height/ length as well as to increase reciprocation of movements BUE paired with BLE. Tolerates activity well.   Neuromuscular Re-ed: NMR facilitated throughout session with focus on dynamic gait and standing balance activities. During walks to/ from room, pt guided in head turns with no deviation in path or change in pace. Pt does have diminished capacity to divide attention in busy hallway to all obstacles when engaging in conversation as he must stop ambulation in order to respond. Step height/ length and balance coordination challenged in  stepping activity forward and laterally over 3 weighted bars and one small wedge pillow for increased height clearance. Pt performs with HHA and no AD. VC provided throughout for weight shifting to improve step height/ length and providing room for trailing foot placement. Pt improves with practice and verbal encouragement. 2 sets of 2 reps performed requiring seated rest break between sets. Pt also guided in dynamic stepping and reaching activity using the BITS system for number hunt game. Demonstrates ability to safely choose which foot to step with in order to optimize reach to hit numerical target on screen. VC intermittently for upright posture and to step back with feet together prior to each reach. Activity performed for 95minutes with static reaching to get accustomed to game, then >38min from further distance.   Patient supine in bed at end of session with bed alarm set, and all needs within reach.   Therapy/Group: Individual Therapy  [name] 05/11/2020, 12:24 PM

## 2020-05-11 NOTE — Progress Notes (Signed)
Occupational Therapy Weekly Progress Note  Patient Details  Name: Steven Meadows MRN: [id number] Date of Birth: 10-Jul-1931  Beginning of progress report period: May 04, 2020 End of progress report period: May 11, 2020  Today's Date: 05/11/2020 OT Individual Time: 6579-0383 OT Individual Time Calculation (min): 75 min    Patient cont to progress towards LTG. No STG this reporting period 2/2 ELOS. Pt overall progressing with improved dynamic standing balance during standing ADLs, is at supervision level when BUE are supported but will have occasional LOB anteriorly when BUE are not supported. Cont to req min VCs for task completion (e.g., tie shoes before attempting to walk) + safe rollator use. Pt is currently S for seated bathing, dressing, standing grooming, mod I for eating, toilet/shower transfer, and S to CGA for toileting. Cont to rec use ob BSC over toilet at home for use of rails + shower chair. Patient continues to demonstrate the following deficits: muscle weakness and decreased cardiorespiratoy endurance and therefore will continue to benefit from skilled OT intervention to enhance overall performance with BADL and iADL.  Patient progressing toward long term goals..  Continue plan of care.  OT Short Term Goals Week 2:    STG = LTG 2/2 ELOS  Skilled Therapeutic Interventions/Progress Updates:    Pt received semi-reclined in bed, agreeable to therapy. Session focus on toileting, dressing, and dynamic standing balance in prep for improved standing ADL performance.  Pt completed bed mobility with distant supervision. Donned B shoes with min A to adjust B feet into shoes, total A to tie 2/2 time. Pt attempted to stand without having B feet all the way into shoes. Sit > stand, amb to toilet with close S + rollator. Did req min A to lock rollator breaks before sit>stand. Had continent BM, completed posterior peri care with use of R/L grab bar. Pt significantly flexes forward at trunk  req intermittant CGA for balance. Pt does not have grab bars around toilet at home, but is able to hold on to sink in front of him.  Discussed installing grab bars or use of BSC rails to increase safety with pericare at home, pt states he will be fine, but will cont to discuss further with pt + wife. Pt amb to sink and washed hands with rollator + close S. Seated EOB, doffed/donned shirt, underwear, pants, B shoes with distant supervision, pt progressed to not req VCs to lock rollator breaks. Amb to gym with rollator + close S, pt navigates hallway obstacles without VCs. Standing at high low table, with BUE unsupported, pt req close S for balance to complete visuospatial constructional task. Completed one tube design with S + mod I for increased time, did not complete second design accurately req min A to finish. Amb back to room and completed bed mobility with close S, req min A to adjust bed to pt preference 2/2 difficulty problem solving bed features. Denies pain this session. Pt left semi-reclined in bed with bed alarm, engaged, call bell in reach, and all immediate needs met.    Therapy Documentation Precautions:  Precautions Precautions: Fall,Other (comment) Precaution Comments: HOH, STM impairment Restrictions Weight Bearing Restrictions: No Pain: Pain Assessment Pain Scale: 0-10 Pain Score: 0-No pain ADL: See Care Tool for more details. Therapy/Group: Individual Therapy  Volanda Napoleon, MS, OTR/L 05/11/2020, 10:59 AM

## 2020-05-11 NOTE — Progress Notes (Signed)
Physical Therapy Session Note  Patient Details  Name: Steven Meadows MRN: [id number] Date of Birth: 1931/05/11  Today's Date: 05/11/2020 PT Individual Time: 1315-1345 PT Individual Time Calculation (min): 30 min   Short Term Goals: Week 1:  PT Short Term Goal 1 (Week 1): STG=LTG due to ELOS Week 2:    Week 3:     Skilled Therapeutic Interventions/Progress Updates:    PAIN denies pain at this time Pt initially supine and readily agreeable to session w/focus on functional gait and balance. Supine to sit w/supervision and rail. Pt able to donn shoes in sitting but assist provided for tying shoes due to time constraints. Sit to stand w/cga, additional time to rollator. Gait 162ft w/supervision, cues to increase clearance due to tendency for decreased step height and length and resultant forefoot initial contact.  Standing balance: W/single UE support on walker pt engaged in tapping 5in cones w/min assist for balance  In parallel bars worked on step height via tapping -->alternating tapping 5in step w/bilat then single uE support. Lateral tapping 5in step ---->wide stepping over 5in step back and forth.  Gait around obstacles at 4-95ft intervals using rollator w/cga, occasionally contacts obstacles to L, corrects w/cues.  Gait 166ft w/rollator to bedside, turn/sit to bed w/cga.  Sit to supine ww/supervision and additional time.  Pt left supine w/rails up x 3, alarm set, bed in lowest position, and needs in reach.     Therapy Documentation Precautions:  Precautions Precautions: Fall,Other (comment) Precaution Comments: HOH, STM impairment Restrictions Weight Bearing Restrictions: No    Therapy/Group: Individual Therapy  Callie Fielding, [place] 05/11/2020, 2:05 PM

## 2020-05-11 NOTE — Progress Notes (Signed)
Physical Therapy Session Note  Patient Details  Name: Steven Meadows MRN: [id number] Date of Birth: 05/25/1931  Today's Date: 05/11/2020 PT Individual Time: 1703-1735 PT Individual Time Calculation (min): 32 min   Short Term Goals: Week 1:  PT Short Term Goal 1 (Week 1): STG=LTG due to ELOS  Skilled Therapeutic Interventions/Progress Updates:   Pt received supine in bed and agreeable to PT. Supine>sit transfer with supervision assist and cues for decreased use of bed rail as able. Pt able to don shoes sitting EOB with increased time but no cues. Gait training through hall with Rollator 2 x 180ft with supervision assist and visual cues for improved heel contact and step height BLE.  Dynamic standing balance while engaged in fine/gross motor task of Wii bowling and standing on arex pad. Pt able to tolerate 8 frames in standing with 1 UE support on Rollator. Supervision assist from PT to maintain balance throughout. One near LOB posteriorly, but able to correct with UE support on rollator. Pt returned to room and performed ambulatory transfer to bed with rollator and supervision assist. . Sit>supine completed with increased time and supervision assist, and left supine in bed with call bell in reach and all needs met.        Therapy Documentation Precautions:  Precautions Precautions: Fall,Other (comment) Precaution Comments: HOH, STM impairment Restrictions Weight Bearing Restrictions: No Pain: denies Therapy/Group: Individual Therapy   E  05/11/2020, 5:53 PM  

## 2020-05-11 NOTE — Progress Notes (Signed)
Patient ID: Steven Meadows, male   DOB: [birth year], 87 y.o.   MRN: [id number]  [facility] referral sent to [facility] for review. Sw will follow up  Erlene Quan, [place]

## 2020-05-11 NOTE — Progress Notes (Signed)
[place] PHYSICAL MEDICINE & REHABILITATION PROGRESS NOTE   Subjective/Complaints: Loose BM, ~3 x per day  Has had HA ~3 am on 2-3 occasion, relieved by tylenol no day time HA Reviewed med list   ROS: freq BMs.  Denies CP, SOB, N/V/D  Objective:   No results found. No results for input(s): WBC, HGB, HCT, PLT in the last 72 hours. No results for input(s): NA, K, CL, CO2, GLUCOSE, BUN, CREATININE, CALCIUM in the last 72 hours.  Intake/Output Summary (Last 24 hours) at 05/11/2020 0746 Last data filed at 05/11/2020 0328 Gross per 24 hour  Intake 480 ml  Output 1100 ml  Net -620 ml        Physical Exam: Vital Signs Blood pressure 134/86, pulse 71, temperature 97.8 F (36.6 C), resp. rate 18, height 5\' 11"  (1.803 m), weight 98.5 kg, SpO2 97 %.     General: No acute distress Mood and affect are appropriate Heart: Regular rate and rhythm no rubs murmurs or extra sounds Lungs: Clear to auscultation, breathing unlabored, no rales or wheezes Abdomen: Positive bowel sounds, soft nontender to palpation, nondistended Extremities: No clubbing, cyanosis, or edema Skin: No evidence of breakdown, no evidence of rash   Psych: Normal mood.  Normal behavior. Musc: No edema in extremities.  No tenderness in extremities. Neuro: Alert Motor: 5/5 throughout, stable Sensation reduce proprio> LT in L>R LE although cooperated better on left side was moving the right toes during testing   Assessment/Plan: 1. Functional deficits which require 3+ hours per day of interdisciplinary therapy in a comprehensive inpatient rehab setting.  Physiatrist is providing close team supervision and 24 hour management of active medical problems listed below.  Physiatrist and rehab team continue to assess barriers to discharge/monitor patient progress toward functional and medical goals  Care Tool:  Bathing    Body parts bathed by patient: Face,Right arm,Left arm,Chest,Abdomen,Front perineal area,Right  upper leg,Left upper leg,Buttocks,Left lower leg,Right lower leg   Body parts bathed by helper: Right lower leg,Left lower leg     Bathing assist Assist Level: Supervision/Verbal cueing     Upper Body Dressing/Undressing Upper body dressing   What is the patient wearing?: Pull over shirt    Upper body assist Assist Level: Supervision/Verbal cueing    Lower Body Dressing/Undressing Lower body dressing      What is the patient wearing?: Underwear/pull up,Pants     Lower body assist Assist for lower body dressing: Supervision/Verbal cueing     Toileting Toileting    Toileting assist Assist for toileting: Supervision/Verbal cueing     Transfers Chair/bed transfer  Transfers assist     Chair/bed transfer assist level: Supervision/Verbal cueing Chair/bed transfer assistive device: Programmer, multimedia   Ambulation assist      Assist level: Contact Guard/Touching assist Assistive device: Walker-rolling Max distance: ~250   Walk 10 feet activity   Assist     Assist level: Supervision/Verbal cueing Assistive device: Walker-rolling   Walk 50 feet activity   Assist    Assist level: Supervision/Verbal cueing Assistive device: Walker-rolling    Walk 150 feet activity   Assist Walk 150 feet activity did not occur: Safety/medical concerns  Assist level: Supervision/Verbal cueing Assistive device: Walker-rolling    Walk 10 feet on uneven surface  activity   Assist Walk 10 feet on uneven surfaces activity did not occur: Safety/medical concerns         Wheelchair     Assist Will patient use wheelchair at discharge?: No Type  of Wheelchair: Manual    Wheelchair assist level: Minimal Assistance - Patient > 75% Max wheelchair distance: 150    Wheelchair 50 feet with 2 turns activity    Assist        Assist Level: Minimal Assistance - Patient > 75%   Wheelchair 150 feet activity     Assist      Assist Level:  Minimal Assistance - Patient > 75%   Blood pressure 134/86, pulse 71, temperature 97.8 F (36.6 C), resp. rate 18, height 5\' 11"  (1.803 m), weight 98.5 kg, SpO2 97 %.   Medical Problem List and Plan: 1.  Altered mental status with dysarthric speech secondary to small left thalamic hemorrhage in setting of Eliquis coagulopathy  Continue CIR PT, OT, SLP tent d/c date 1/11 Plan is to use RW in house and rollator outdoors   2.  Antithrombotics: -DVT/anticoagulation: SCDs             -antiplatelet therapy: N/A 3. Pain Management: Tylenol as needed 4. Mood: Provide emotional support             -antipsychotic agents: N/A 5. Neuropsych: This patient he is capable of making decisions on his own behalf. 6. Skin/Wound Care: Routine skin checks 7. Fluids/Electrolytes/Nutrition: Routine I/Os. 8.  Hypertension.  Continue Cardizem 240 mg daily, hydralazine 25 mg twice daily, Maxide 1 tablet daily.  Monitor with increased mobility Vitals:   05/10/20 1937 05/11/20 0526  BP: (!) 118/97 134/86  Pulse: 68 71  Resp: 16 18  Temp: 97.9 F (36.6 C) 97.8 F (36.6 C)  SpO2: 99% 97%   Controlled after increasing hydralazine to 25mg  TID 1/7 9.  Atrial fibrillation.  Continue Cardizem.  Cardiac rate controlled.  Eliquis discontinued due to thalamic hemorrhage 10.  Hyperlipidemia.  Zetia 11.  Hypokalemia- Likely related to maxide will supplement  K+ normal at 3.7 cont KCL  12. Hyponatremia Improving 1/3 13. AKI  Resolved creat 1.09 1/3 14. Constipation resolved now with freq loose BMs will change senna to qhs prn    LOS: 8 days A FACE TO FACE EVALUATION WAS PERFORMED  Charlett Blake 05/11/2020, 7:46 AM

## 2020-05-12 ENCOUNTER — Inpatient Hospital Stay (HOSPITAL_COMMUNITY): Payer: Medicare Other | Admitting: Physical Therapy

## 2020-05-12 MED ORDER — SENNOSIDES-DOCUSATE SODIUM 8.6-50 MG PO TABS
1.0000 | ORAL_TABLET | Freq: Every day | ORAL | Status: DC
  Administered 2020-05-12 – 2020-05-14 (×2): 1 via ORAL
  Filled 2020-05-12 (×3): qty 1

## 2020-05-12 NOTE — Progress Notes (Signed)
[place] PHYSICAL MEDICINE & REHABILITATION PROGRESS NOTE   Subjective/Complaints: Pt denies any issues this morning. No h/a. No stools since 1/6  ROS: Patient denies fever, rash, sore throat, blurred vision, nausea, vomiting, diarrhea, cough, shortness of breath or chest pain, joint or back pain, headache, or mood change.    Objective:   No results found. No results for input(s): WBC, HGB, HCT, PLT in the last 72 hours. No results for input(s): NA, K, CL, CO2, GLUCOSE, BUN, CREATININE, CALCIUM in the last 72 hours.  Intake/Output Summary (Last 24 hours) at 05/12/2020 0958 Last data filed at 05/12/2020 0837 Gross per 24 hour  Intake 480 ml  Output 1575 ml  Net -1095 ml        Physical Exam: Vital Signs Blood pressure 128/89, pulse 66, temperature (!) 97.5 F (36.4 C), temperature source Oral, resp. rate 16, height 5\' 11"  (1.803 m), weight 98.5 kg, SpO2 93 %.     Constitutional: No distress . Vital signs reviewed. HEENT: EOMI, oral membranes moist Neck: supple Cardiovascular: RRR without murmur. No JVD    Respiratory/Chest: CTA Bilaterally without wheezes or rales. Normal effort    GI/Abdomen: BS +, non-tender, non-distended Ext: no clubbing, cyanosis, or edema Psych: pleasant and cooperative Skin: intact, warm Musc: No edema in extremities.  No tenderness in extremities. Neuro: Alert. oriented Motor: 5/5 throughout, stable Sensation reduce proprio> LT in L>R LE.   Assessment/Plan: 1. Functional deficits which require 3+ hours per day of interdisciplinary therapy in a comprehensive inpatient rehab setting.  Physiatrist is providing close team supervision and 24 hour management of active medical problems listed below.  Physiatrist and rehab team continue to assess barriers to discharge/monitor patient progress toward functional and medical goals  Care Tool:  Bathing    Body parts bathed by patient: Face,Right arm,Left arm,Chest,Abdomen,Front perineal area,Right  upper leg,Left upper leg,Buttocks,Left lower leg,Right lower leg   Body parts bathed by helper: Right lower leg,Left lower leg     Bathing assist Assist Level: Supervision/Verbal cueing     Upper Body Dressing/Undressing Upper body dressing   What is the patient wearing?: Pull over shirt    Upper body assist Assist Level: Supervision/Verbal cueing    Lower Body Dressing/Undressing Lower body dressing      What is the patient wearing?: Underwear/pull up,Pants     Lower body assist Assist for lower body dressing: Supervision/Verbal cueing     Toileting Toileting    Toileting assist Assist for toileting: Contact Guard/Touching assist     Transfers Chair/bed transfer  Transfers assist     Chair/bed transfer assist level: Supervision/Verbal cueing Chair/bed transfer assistive device: Programmer, multimedia   Ambulation assist      Assist level: Supervision/Verbal cueing Assistive device: Rollator Max distance: 150   Walk 10 feet activity   Assist     Assist level: Supervision/Verbal cueing Assistive device: Rollator   Walk 50 feet activity   Assist    Assist level: Supervision/Verbal cueing Assistive device: Rollator    Walk 150 feet activity   Assist Walk 150 feet activity did not occur: Safety/medical concerns  Assist level: Supervision/Verbal cueing Assistive device: Rollator    Walk 10 feet on uneven surface  activity   Assist Walk 10 feet on uneven surfaces activity did not occur: Safety/medical concerns         Wheelchair     Assist Will patient use wheelchair at discharge?: No Type of Wheelchair: Manual    Wheelchair assist level: Minimal  Assistance - Patient > 75% Max wheelchair distance: 150    Wheelchair 50 feet with 2 turns activity    Assist        Assist Level: Minimal Assistance - Patient > 75%   Wheelchair 150 feet activity     Assist      Assist Level: Minimal Assistance -  Patient > 75%   Blood pressure 128/89, pulse 66, temperature (!) 97.5 F (36.4 C), temperature source Oral, resp. rate 16, height 5\' 11"  (1.803 m), weight 98.5 kg, SpO2 93 %.   Medical Problem List and Plan: 1.  Altered mental status with dysarthric speech secondary to small left thalamic hemorrhage in setting of Eliquis coagulopathy  Continue CIR PT, OT, SLP  tent d/c date 1/11  Plan is to use RW in house and rollator outdoors   2.  Antithrombotics: -DVT/anticoagulation: SCDs             -antiplatelet therapy: N/A 3. Pain Management: Tylenol as needed 4. Mood: Provide emotional support             -antipsychotic agents: N/A 5. Neuropsych: This patient he is capable of making decisions on his own behalf. 6. Skin/Wound Care: Routine skin checks 7. Fluids/Electrolytes/Nutrition: Routine I/Os. 8.  Hypertension.  Continue Cardizem 240 mg daily, hydralazine 25 mg twice daily, Maxide 1 tablet daily.  Monitor with increased mobility Vitals:   05/11/20 2008 05/12/20 0330  BP: 130/88 128/89  Pulse: 66 66  Resp: 18 16  Temp: 97.7 F (36.5 C) (!) 97.5 F (36.4 C)  SpO2: 99% 93%   1/8 Controlled after increasing hydralazine to 25mg  TID 9.  Atrial fibrillation.  Continue Cardizem.  Cardiac rate controlled.  Eliquis discontinued due to thalamic hemorrhage 10.  Hyperlipidemia.  Zetia 11.  Hypokalemia- Likely related to maxide will supplement  K+ normal at 3.7 cont KCL  12. Hyponatremia Improving 1/3 13. AKI  Resolved creat 1.09 1/3 14. Constipation resolved now with freq loose BMs will change senna to qhs prn   -stooling has stopped   -resume HS senna  LOS: 9 days A FACE TO FACE EVALUATION WAS PERFORMED  Meredith Staggers 05/12/2020, 9:58 AM

## 2020-05-12 NOTE — Progress Notes (Signed)
Physical Therapy Weekly Progress Note  Patient Details  Name: Steven Meadows MRN: [id number] Date of Birth: 03-Feb-1932  Beginning of progress report period: May 04, 2020 End of progress report period: May 12, 2020  Today's Date: 05/12/2020 PT Individual Time: 8412-8208 PT Individual Time Calculation (min): 40 min   Patient has met 1 of 1 short term goals.  Pt is making steady progress towards LTG of Mod I at ambulatory level, but requires slightly extended stay to improve independence with gait and transfers.  Presently pt requires supervision assist with gait using RW, rollator or SPC. Noted improvement in safety with rollator/RW over SPC.   Patient continues to demonstrate the following deficits muscle weakness and muscle joint tightness, decreased cardiorespiratoy endurance, decreased coordination and Periphreal sensory loss and decreased standing balance and decreased balance strategies and therefore will continue to benefit from skilled PT intervention to increase functional independence with mobility.  Patient progressing toward long term goals..  Continue plan of care.  PT Short Term Goals Week 1:  PT Short Term Goal 1 (Week 1): STG=LTG due to ELOS PT Short Term Goal 1 - Progress (Week 1): Progressing toward goal Week 2:  PT Short Term Goal 1 (Week 2): STG=LTG due to ELOS  Skilled Therapeutic Interventions/Progress Updates:   Pt received supine in bed and agreeable to PT. Supine>sit transfer without assist, but increased time and effort for success. PT assisted pt with donning shoes for time management. Ambulatory transfer to sink to perform hand hygiene. Distant supervision assist for safety while standing at sink with cues for AD parts management.   Gait training with Rollator through hall 2 x 14f with supervision assist and only intermittent cues for improved step height, considerable improvement from prior sessions.   Pre gait reciprocal stepping over bar weight on  floor 2x 10 with BURE support on RW and supervision assist with cues for AD placement. increasing step length with repetitions  Dynamic gait training to step over 8 ankle weights in floor with Rollator x 1, SPC x 1 and RW x 1. Supervision assist for all AD, but Significantly increased safety and step length noted when ambulating with RW or rollator. PT educated on use of either Rollator of RW for home use to decrease fall risk once d/c from hospital.   Pt returned to room and performed ambulatory transfer to bed with rollator and distant supervision assist. Sit>supine completed without assist, and left supine in bed with call bell in reach and all needs met.       Therapy Documentation Precautions:  Precautions Precautions: Fall,Other (comment) Precaution Comments: HOH, STM impairment Restrictions Weight Bearing Restrictions: No    Vital Signs: Therapy Vitals Temp: 97.8 F (36.6 C) Pulse Rate: 61 Resp: 18 BP: 121/66 Patient Position (if appropriate): Lying Oxygen Therapy SpO2: 94 % O2 Device: Room Air Pain: Pain Assessment Pain Scale: 0-10 Pain Score: 0-No pain   Therapy/Group: Individual Therapy  ALorie Phenix1/12/2020, 4:00 PM

## 2020-05-13 NOTE — Discharge Summary (Signed)
Physician Discharge Summary  Patient ID: WHALEN TROMPETER MRN: [id number] DOB/AGE: 06-01-1931 87 y.o.  Admit date: 05/03/2020 Discharge date: 05/15/2020  Discharge Diagnoses:  Principal Problem:   Thalamic hemorrhage ([place]) Active Problems:   Hyponatremia   Hypokalemia   AKI (acute kidney injury) ([place])   Benign essential HTN   Slow transit constipation Atrial fibrillation Hyperlipidemia  Discharged Condition: Stable  Significant Diagnostic Studies: CT Angio Head W or Wo Contrast  Result Date: 04/30/2020 CLINICAL DATA:  Acute neuro deficit.  Aphasia. EXAM: CT ANGIOGRAPHY HEAD AND NECK TECHNIQUE: Multidetector CT imaging of the head and neck was performed using the standard protocol during bolus administration of intravenous contrast. Multiplanar CT image reconstructions and MIPs were obtained to evaluate the vascular anatomy. Carotid stenosis measurements (when applicable) are obtained utilizing NASCET criteria, using the distal internal carotid diameter as the denominator. CONTRAST:  3mL OMNIPAQUE IOHEXOL 350 MG/ML SOLN COMPARISON:  CT head 04/30/2020.  CT angio head and neck 09/03/2018 FINDINGS: CTA NECK FINDINGS Aortic arch: Mild atherosclerotic calcification aortic arch. Proximal great vessels tortuous but widely patent. Right carotid system: Atherosclerotic calcification right carotid bifurcation without significant stenosis. Left carotid system: Atherosclerotic calcification left carotid bifurcation without stenosis Vertebral arteries: Right vertebral artery patent to the basilar. Hypoplastic left vertebral artery ends in PICA Skeleton: Cervical spondylosis.  No acute skeletal abnormality. Other neck: Negative for mass or adenopathy in the neck. Upper chest: Lung apices clear bilaterally. Review of the MIP images confirms the above findings CTA HEAD FINDINGS Anterior circulation: Atherosclerotic calcification in the cavernous carotid bilaterally without significant stenosis. Anterior and  middle cerebral arteries patent bilaterally without significant stenosis or large vessel occlusion. Posterior circulation: Left vertebral artery ends in PICA. Right vertebral artery supplies the basilar. 6 mm aneurysm with associated calcification in the wall at the vertebrobasilar junction. Mild stenosis distal right vertebral artery proximal to the aneurysm. Basilar is diminutive due to fetal origin of the posterior circulation bilaterally. No significant stenosis or large vessel occlusion in the posterior circulation Venous sinuses: Normal venous enhancement Anatomic variants: None Review of the MIP images confirms the above findings IMPRESSION: 1. Negative for intracranial large vessel occlusion 2. 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 3. Mild atherosclerotic calcification of the carotid bifurcation without significant carotid stenosis. Atherosclerotic calcification in the cavernous carotid bilaterally. Electronically Signed   By: Franchot Gallo M.D.   On: 04/30/2020 16:38   CT HEAD WO CONTRAST  Result Date: 05/02/2020 CLINICAL DATA:  87 year old male code stroke presentation on 04/30/2020, with suspected subacute left thalamic microhemorrhage. EXAM: CT HEAD WITHOUT CONTRAST TECHNIQUE: Contiguous axial images were obtained from the base of the skull through the vertex without intravenous contrast. COMPARISON:  Brain MRI 05/01/2020 and earlier. FINDINGS: Brain: Dorsal left thalamic hyperdense hemorrhage measuring 4 mm has faded since 04/30/2020 (series 3, image 20). No associated edema or mass effect. No intraventricular or extra-axial extension. Underlying advanced chronic small vessel disease with confluent hypodensity in the bilateral cerebral white matter and extensive deep gray nuclei and pons heterogeneity. No midline shift, ventriculomegaly, mass effect, evidence of mass lesion, new intracranial hemorrhage or evidence of cortically based acute infarction.  Vascular: Calcified atherosclerosis at the skull base. Skull: No acute osseous abnormality identified. Sinuses/Orbits: Visualized paranasal sinuses and mastoids are stable and well pneumatized. Other: Visualized orbits and scalp soft tissues are within normal limits. IMPRESSION: 1. Partially regressed punctate left thalamic hemorrhage since 04/30/2020. No complicating features. 2. Advanced chronic small vessel disease.  No new intracranial abnormality. Electronically Signed   By: Genevie Ann M.D.   On: 05/02/2020 04:15   CT Angio Neck W and/or Wo Contrast  Result Date: 04/30/2020 CLINICAL DATA:  Acute neuro deficit.  Aphasia. EXAM: CT ANGIOGRAPHY HEAD AND NECK TECHNIQUE: Multidetector CT imaging of the head and neck was performed using the standard protocol during bolus administration of intravenous contrast. Multiplanar CT image reconstructions and MIPs were obtained to evaluate the vascular anatomy. Carotid stenosis measurements (when applicable) are obtained utilizing NASCET criteria, using the distal internal carotid diameter as the denominator. CONTRAST:  36mL OMNIPAQUE IOHEXOL 350 MG/ML SOLN COMPARISON:  CT head 04/30/2020.  CT angio head and neck 09/03/2018 FINDINGS: CTA NECK FINDINGS Aortic arch: Mild atherosclerotic calcification aortic arch. Proximal great vessels tortuous but widely patent. Right carotid system: Atherosclerotic calcification right carotid bifurcation without significant stenosis. Left carotid system: Atherosclerotic calcification left carotid bifurcation without stenosis Vertebral arteries: Right vertebral artery patent to the basilar. Hypoplastic left vertebral artery ends in PICA Skeleton: Cervical spondylosis.  No acute skeletal abnormality. Other neck: Negative for mass or adenopathy in the neck. Upper chest: Lung apices clear bilaterally. Review of the MIP images confirms the above findings CTA HEAD FINDINGS Anterior circulation: Atherosclerotic calcification in the cavernous carotid  bilaterally without significant stenosis. Anterior and middle cerebral arteries patent bilaterally without significant stenosis or large vessel occlusion. Posterior circulation: Left vertebral artery ends in PICA. Right vertebral artery supplies the basilar. 6 mm aneurysm with associated calcification in the wall at the vertebrobasilar junction. Mild stenosis distal right vertebral artery proximal to the aneurysm. Basilar is diminutive due to fetal origin of the posterior circulation bilaterally. No significant stenosis or large vessel occlusion in the posterior circulation Venous sinuses: Normal venous enhancement Anatomic variants: None Review of the MIP images confirms the above findings IMPRESSION: 1. Negative for intracranial large vessel occlusion 2. 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 3. Mild atherosclerotic calcification of the carotid bifurcation without significant carotid stenosis. Atherosclerotic calcification in the cavernous carotid bilaterally. Electronically Signed   By: Franchot Gallo M.D.   On: 04/30/2020 16:38   MR BRAIN WO CONTRAST  Result Date: 05/01/2020 CLINICAL DATA:  87 year old male code stroke presentation yesterday. History of 6 x 8 mm aneurysm at the vertebrobasilar junction. EXAM: MRI HEAD WITHOUT CONTRAST TECHNIQUE: Multiplanar, multiecho pulse sequences of the brain and surrounding structures were obtained without intravenous contrast. COMPARISON:  Head CT 04/30/2020 and earlier. Brain MRI 09/03/2018 and earlier. FINDINGS: Brain: No restricted diffusion or evidence of acute infarction. But there is increased susceptibility in the left thalamus since May corresponding to multiple microhemorrhages which are new or increased from that time. The most recent of these appears to be that seen on series 11, image 15 along the dorsal superior thalamus. But there is no regional edema or associated mass effect. Multiple chronic  microhemorrhages also in the right thalamus, brainstem and occasionally elsewhere in the cerebral hemispheres appear stable since May. Advanced chronic T2 heterogeneity throughout the bilateral deep gray nuclei. Increased T2 heterogeneity related to lacunar infarcts in the pons since May (series 10, image 8). Additional confluent bilateral cerebral white matter T2 and FLAIR hyperintensity with multiple areas most resembling chronic white matter lacunar infarcts. No definite cortical encephalomalacia. No midline shift, mass effect, evidence of mass lesion, ventriculomegaly, extra-axial collection or acute intracranial hemorrhage. Cervicomedullary junction and pituitary are within normal limits. Vascular: Major intracranial vascular flow voids are stable since May, with  vertebrobasilar junction aneurysm redemonstrated on series 10, image 4. Skull and upper cervical spine: Stable and negative; C2-C3 ankylosis. Sinuses/Orbits: Stable and negative. Other: Mastoids remain clear. Stable visible internal auditory structures. IMPRESSION: 1. Progressed small vessel disease in the left thalamus and pons since May, including a late subacute appearing left thalamic microhemorrhage corresponding to the area of density on CT yesterday. No associated edema or mass effect. 2. No superimposed acute infarct or acute intracranial hemorrhage identified. 3. Underlying severe chronic small vessel disease, chronic vertebrobasilar junction Aneurysm. Electronically Signed   By: Genevie Ann M.D.   On: 05/01/2020 05:05   Chest Port 1 View  Result Date: 04/30/2020 CLINICAL DATA:  Confusion.  Intracranial hemorrhage. EXAM: PORTABLE CHEST 1 VIEW COMPARISON:  Radiograph 03/18/2011 FINDINGS: Upper normal heart size.The cardiomediastinal contours are normal. Mild aortic atherosclerosis. Subsegmental atelectasis in the left mid lung. Pulmonary vasculature is normal. No consolidation, pleural effusion, or pneumothorax. No acute osseous abnormalities  are seen. IMPRESSION: Subsegmental atelectasis in the left mid lung. Electronically Signed   By: Keith Rake M.D.   On: 04/30/2020 18:22   ECHOCARDIOGRAM COMPLETE  Result Date: 05/01/2020    ECHOCARDIOGRAM REPORT   Patient Name:   DENNES VOLKOV Date of Exam: 05/01/2020 Medical Rec #:  [id number]      Height:       70.5 in Accession #:    [id number]     Weight:       213.6 lb Date of Birth:  03/11/1932       BSA:          2.158 m Patient Age:    7 years       BP:           121/105 mmHg Patient Gender: M              HR:           91 bpm. Exam Location:  Inpatient Procedure: 2D Echo, Color Doppler and Cardiac Doppler Indications:    Stroke 434.91 / I163.9  History:        Patient has prior history of Echocardiogram examinations, most                 recent 09/04/2018. Stroke, Arrythmias:Atrial Fibrillation; Risk                 Factors:Hypertension.  Sonographer:    Bernadene Person RDCS Referring Phys: [id number] [place]  1. Left ventricular ejection fraction, by estimation, is 60 to 65%. The left ventricle has normal function. The left ventricle has no regional wall motion abnormalities. Left ventricular diastolic function could not be evaluated.  2. Right ventricular systolic function is normal. The right ventricular size is normal. There is normal pulmonary artery systolic pressure.  3. Left atrial size was moderately dilated.  4. Right atrial size was mildly dilated.  5. The mitral valve is normal in structure. No evidence of mitral valve regurgitation.  6. The aortic valve is tricuspid. Aortic valve regurgitation is not visualized. Mild aortic valve sclerosis is present, with no evidence of aortic valve stenosis.  7. There is mild dilatation of the ascending aorta, measuring 39 mm. FINDINGS  Left Ventricle: Left ventricular ejection fraction, by estimation, is 60 to 65%. The left ventricle has normal function. The left ventricle has no regional wall motion abnormalities. The left ventricular  internal cavity size was normal in size. There is  no left ventricular hypertrophy. Left ventricular diastolic function could not be  evaluated due to atrial fibrillation. Left ventricular diastolic function could not be evaluated. Right Ventricle: The right ventricular size is normal. No increase in right ventricular wall thickness. Right ventricular systolic function is normal. There is normal pulmonary artery systolic pressure. The tricuspid regurgitant velocity is 2.39 m/s, and  with an assumed right atrial pressure of 3 mmHg, the estimated right ventricular systolic pressure is 0000000 mmHg. Left Atrium: Left atrial size was moderately dilated. Right Atrium: Right atrial size was mildly dilated. Pericardium: There is no evidence of pericardial effusion. Mitral Valve: The mitral valve is normal in structure. Mild mitral annular calcification. No evidence of mitral valve regurgitation. Tricuspid Valve: The tricuspid valve is normal in structure. Tricuspid valve regurgitation is not demonstrated. Aortic Valve: The aortic valve is tricuspid. Aortic valve regurgitation is not visualized. Mild aortic valve sclerosis is present, with no evidence of aortic valve stenosis. Pulmonic Valve: The pulmonic valve was not well visualized. Pulmonic valve regurgitation is not visualized. Aorta: The aortic root is normal in size and structure. There is mild dilatation of the ascending aorta, measuring 39 mm. IAS/Shunts: No atrial level shunt detected by color flow Doppler.  LEFT VENTRICLE PLAX 2D LVIDd:         5.40 cm LVIDs:         3.80 cm LV PW:         0.90 cm LV IVS:        0.90 cm LVOT diam:     1.90 cm LV SV:         45 LV SV Index:   21 LVOT Area:     2.84 cm  RIGHT VENTRICLE TAPSE (M-mode): 1.7 cm LEFT ATRIUM             Index       RIGHT ATRIUM           Index LA diam:        4.40 cm 2.04 cm/m  RA Area:     16.60 cm LA Vol (A2C):   52.2 ml 24.19 ml/m RA Volume:   34.20 ml  15.85 ml/m LA Vol (A4C):   91.7 ml 42.50 ml/m  LA Biplane Vol: 74.7 ml 34.62 ml/m  AORTIC VALVE LVOT Vmax:   91.80 cm/s LVOT Vmean:  60.000 cm/s LVOT VTI:    0.160 m  AORTA Ao Root diam: 3.70 cm Ao Asc diam:  3.90 cm TRICUSPID VALVE TR Peak grad:   22.8 mmHg TR Vmax:        239.00 cm/s  SHUNTS Systemic VTI:  0.16 m Systemic Diam: 1.90 cm Dani Gobble Croitoru MD Electronically signed by Sanda Klein MD Signature Date/Time: 05/01/2020/11:55:21 AM    Final    CT HEAD CODE STROKE WO CONTRAST  Result Date: 04/30/2020 CLINICAL DATA:  Code stroke.  Neuro deficit, acute. EXAM: CT HEAD WITHOUT CONTRAST TECHNIQUE: Contiguous axial images were obtained from the base of the skull through the vertex without intravenous contrast. COMPARISON:  Head CT May 1st, 2020. FINDINGS: Brain: 6 mm hyperdense focus in the left thalamus. Gray-white differentiation is maintained. No hydrocephalus, extra-axial collection or mass lesion. Vascular: No hyperdense vessel. Calcified plaques in the bilateral carotid siphons and intracranial vertebral arteries. Skull: Normal. Negative for fracture or focal lesion. Sinuses/Orbits: No acute finding. ASPECTS [facility] Stroke Program Early CT Score) - Ganglionic level infarction (caudate, lentiform nuclei, internal capsule, insula, M1-M3 cortex): 7 - Supraganglionic infarction (M4-M6 cortex): 3 Total score (0-10 with 10 being normal): 10 IMPRESSION: 1. A 6 mm  hyperdense focus in the left thalamus, which may reflect a small hemorrhage. 2. ASPECTS is 10. Findings communicated to Dr. Cheral Marker via [facility] paging system at 16:12 on 04/30/2020. Electronically Signed   By: Pedro Earls M.D.   On: 04/30/2020 16:14    Labs:  Basic Metabolic Panel: No results for input(s): NA, K, CL, CO2, GLUCOSE, BUN, CREATININE, CALCIUM, MG, PHOS in the last 168 hours.  CBC: No results for input(s): WBC, NEUTROABS, HGB, HCT, MCV, PLT in the last 168 hours.  CBG: No results for input(s): GLUCAP in the last 168 hours.  Family history.  Grandfather  with myocardial infarction paternal grandmother with cancer as well as TIA.  Denies any colon cancer esophageal cancer or rectal cancer Brief HPI:   STORY BRAKEMAN is a 87 y.o. right-handed male with history of hypertension atrial fibrillation maintained on Eliquis severe neuropathy prior CVA VA aneurysm who was admitted 04/30/2020 with acute onset of confusion and garbled speech.  Per chart review lives with spouse reportedly independent prior to admission and active.  Two-level home bed and bath main level 2 steps to entry.  CT of the head showed a 6 mm hyperdense focus in the left thalamus reflecting small hemorrhage.  CT angiogram head and neck negative for intracranial large vessel occlusion.  Eliquis was reversed with Andexxa.  Neurology felt small thalamic hemorrhage in the setting of coagulopathy due to Eliquis maintained on low-dose Cleviprex for blood pressure management.  Follow-up MRI of the brain revealed progression of small vessel disease in the left thalamus and pons with late subacute left thalamic microhemorrhage and severe chronic small vessel disease.  Tolerating a regular diet.  Physical medicine was consulted to assess candidacy for CIR given impaired cognition and mobility and patient was admitted for a comprehensive rehab program   Hospital Course: DYMOND EYE was admitted to rehab 05/03/2020 for inpatient therapies to consist of PT, ST and OT at least three hours five days a week. Past admission physiatrist, therapy team and rehab RN have worked together to provide customized collaborative inpatient rehab.  In regards to patient's small left thalamic hemorrhage in the setting of Eliquis remained stable remained off anticoagulation.  SCDs for DVT prophylaxis.  Patient would follow-up with neurology services.  Blood pressure controlled on Cardizem as well as hydralazine and Maxide monitor closely with outpatient monitoring.  Atrial fibrillation cardiac rate controlled Cardizem as  advised Eliquis discontinued due to thalamic hemorrhage.  Zetia for hyperlipidemia.   Blood pressures were monitored on TID basis and controlled     Rehab course: During patient's stay in rehab weekly team conferences were held to monitor patient's progress, set goals and discuss barriers to discharge. At admission, patient required minimal guard 120 feet rolling walker minimal guard sit to stand minimal assist supine to sit.  Minimal guard grooming minimal assist upper body bathing mod assist lower body bathing minimal assist set by dressing max is lower body dressing  Physical exam.  Blood pressure 125/77 pulse 77 temperature 98 respirations 18 oxygen saturation 96% room air Constitutional.  No acute distress HEENT Head.  Normocephalic and atraumatic Eyes.  Pupils round and reactive to light no discharge without nystagmus Neck.  Supple nontender no JVD without thyromegaly Cardiac irregular irregular rate and rhythm Abdomen.  Soft nontender positive bowel sounds not rebound Respiratory effort normal no respiratory distress without wheeze Skin.  Intact Neuro.  Speech clear mild expressive deficits with delay in verbal output that improved as conversation progressed.  Word finding deficit was noted and he had difficulty recalling children's ages.  He was able to follow simple commands.  Sensory deficits bilateral feet.  5/5 strength throughout  He/She  has had improvement in activity tolerance, balance, postural control as well as ability to compensate for deficits. He/She has had improvement in functional use RUE/LUE  and RLE/LLE as well as improvement in awareness.  Modified independent ambulatory level.  Supine to sit transfers without assist.  He can increase his ambulation up to 150 feet x 2.  Amatory transfer to bed with Rollator and distant supervision assist.  Patient overall progressing with improved dynamic standing balance with ADLs supervision level with bilateral upper extremity  support currently supervision for seated bathing dressing standing grooming modified independent for eating toilet shower transfers and supervision to contact-guard for toileting.  Full family teaching completed plan discharge to home       Disposition: Discharge to home    Diet: Regular  Special Instructions: No driving smoking or alcohol  Medications at discharge 1.  Tylenol as needed 2.  Cardizem CD 240 mg p.o. daily 3.  Zetia 10 mg p.o. daily 4.  Hydralazine 25 mg p.o. every 8 hours 5.  Protonix 40 mg p.o. daily 6.  MiraLAX twice daily hold for loose stools 7.  Klor-Con 10 mEq p.o. daily 8.  Maxide 37.5-25 mg 1 tablet daily  30-35 minutes were spent completing discharge summary and discharge planning  Discharge Instructions    Ambulatory referral to Neurology   Complete by: As directed    An appointment is requested in approximately 4 weeks left thalamic hemorrhage   Ambulatory referral to Physical Medicine Rehab   Complete by: As directed    Moderate complexity follow-up 1 to 2 weeks thalamic hemorrhage     Allergies as of 05/15/2020      Reactions   Nabumetone Other (See Comments)   Gi upset   Ace Inhibitors Cough   Flonase [fluticasone Propionate] Other (See Comments)   Causes nosebleeds      Medication List    STOP taking these medications   apixaban 5 MG Tabs tablet Commonly known as: ELIQUIS   pantoprazole 40 MG tablet Commonly known as: PROTONIX   rosuvastatin 10 MG tablet Commonly known as: CRESTOR     TAKE these medications   acetaminophen 325 MG tablet Commonly known as: TYLENOL Take 2 tablets (650 mg total) by mouth every 4 (four) hours as needed for mild pain (or temp > 37.5 C (99.5 F)).   diltiazem 240 MG 24 hr capsule Commonly known as: CARDIZEM CD Take 1 capsule (240 mg total) by mouth daily.   ezetimibe 10 MG tablet Commonly known as: ZETIA Take 1 tablet (10 mg total) by mouth daily.   hydrALAZINE 25 MG tablet Commonly known  as: APRESOLINE Take 1 tablet (25 mg total) by mouth every 8 (eight) hours. What changed: when to take this   omeprazole 40 MG capsule Commonly known as: PRILOSEC Take 40 mg by mouth daily.   polyethylene glycol 17 g packet Commonly known as: MIRALAX / GLYCOLAX Take 17 g by mouth 2 (two) times daily.   potassium chloride 10 MEQ tablet Commonly known as: KLOR-CON Take 1 tablet (10 mEq total) by mouth daily.   senna-docusate 8.6-50 MG tablet Commonly known as: Senokot-S Take 1 tablet by mouth 2 (two) times daily.   triamterene-hydrochlorothiazide 37.5-25 MG tablet Commonly known as: MAXZIDE-25 Take 1 tablet by mouth daily.  Follow-up Information    Kirsteins, Luanna Salk, MD Follow up.   Specialty: Physical Medicine and Rehabilitation Why: Office to call for appointment Contact information: [place] [address] [phone]        Herminio Commons, MD Follow up.   Specialty: Cardiology Why: Call for appointment              Signed: Lavon Paganini Anayely Constantine 05/15/2020, 5:12 AM

## 2020-05-14 ENCOUNTER — Inpatient Hospital Stay (HOSPITAL_COMMUNITY): Payer: Medicare Other

## 2020-05-14 ENCOUNTER — Inpatient Hospital Stay (HOSPITAL_COMMUNITY): Payer: Medicare Other | Admitting: Physical Therapy

## 2020-05-14 MED ORDER — ACETAMINOPHEN 325 MG PO TABS
650.0000 mg | ORAL_TABLET | ORAL | Status: DC | PRN
Start: 2020-05-14 — End: 2023-08-21

## 2020-05-14 MED ORDER — POLYETHYLENE GLYCOL 3350 17 G PO PACK: 17.0000 g | PACK | Freq: Two times a day (BID) | ORAL | 0 refills | Status: DC

## 2020-05-14 MED ORDER — DILTIAZEM HCL ER COATED BEADS 240 MG PO CP24: 240.0000 mg | ORAL_CAPSULE | Freq: Every day | ORAL | 0 refills | Status: DC

## 2020-05-14 MED ORDER — HYDRALAZINE HCL 25 MG PO TABS: 25.0000 mg | ORAL_TABLET | Freq: Three times a day (TID) | ORAL | 0 refills | Status: DC

## 2020-05-14 MED ORDER — PANTOPRAZOLE SODIUM 40 MG PO TBEC: 40.0000 mg | DELAYED_RELEASE_TABLET | Freq: Every day | ORAL | 0 refills | Status: DC

## 2020-05-14 MED ORDER — TRIAMTERENE-HCTZ 37.5-25 MG PO TABS: 1.0000 | ORAL_TABLET | Freq: Every day | ORAL | 0 refills | Status: DC

## 2020-05-14 MED ORDER — POTASSIUM CHLORIDE CRYS ER 10 MEQ PO TBCR: 10.0000 meq | EXTENDED_RELEASE_TABLET | Freq: Every day | ORAL | 0 refills | Status: DC

## 2020-05-14 MED ORDER — DILTIAZEM HCL ER COATED BEADS 240 MG PO CP24
240.0000 mg | ORAL_CAPSULE | Freq: Every day | ORAL | 0 refills | Status: DC
Start: 2020-05-14 — End: 2023-08-21

## 2020-05-14 MED ORDER — EZETIMIBE 10 MG PO TABS: 10.0000 mg | ORAL_TABLET | Freq: Every day | ORAL | 0 refills | Status: DC

## 2020-05-14 NOTE — Progress Notes (Signed)
Occupational Therapy Discharge Summary  Patient Details  Name: Steven Meadows MRN: [id number] Date of Birth: 03/01/1932  Today's Date: 05/14/2020 OT Individual Time: 1000-1058 OT Individual Time Calculation (min): 58 min    Patient has met 13 of 13 long term goals due to improved activity tolerance, improved balance, postural control, ability to compensate for deficits and improved coordination.  Patient to discharge at overall Modified Independent level.  Patient's care partner is independent to provide the necessary physical and cognitive assistance at discharge.    Reasons goals not met: NA  Recommendation:  Patient will benefit from ongoing skilled OT services in home health setting to continue to advance functional skills in the area of BADL, iADL and Reduce care partner burden.  Equipment: shower chair, 3in1  Reasons for discharge: discharge from hospital  Patient/family agrees with progress made and goals achieved: Yes  OT Discharge Precautions/Restrictions  Precautions Precautions: Fall Precaution Comments: HOH, Restrictions Weight Bearing Restrictions: No Pain Pain Assessment Pain Scale: 0-10 Pain Score: 0-No pain ADL ADL Eating: Independent Where Assessed-Eating: Edge of bed Grooming: Modified independent Where Assessed-Grooming: Chair Upper Body Bathing: Modified independent Where Assessed-Upper Body Bathing: Shower Lower Body Bathing: Modified independent Where Assessed-Lower Body Bathing: Edge of bed Upper Body Dressing: Modified independent (Device) Where Assessed-Upper Body Dressing: Edge of bed Lower Body Dressing: Modified independent Where Assessed-Lower Body Dressing: Edge of bed Toileting: Modified independent Where Assessed-Toileting: Glass blower/designer: Diplomatic Services operational officer Method: Counselling psychologist: Energy manager: Chief Financial Officer Method:  Ambulating Vision Baseline Vision/History: No visual deficits Patient Visual Report: No change from baseline Vision Assessment?: Yes Eye Alignment: Within Functional Limits Ocular Range of Motion: Within Functional Limits Alignment/Gaze Preference: Within Defined Limits Tracking/Visual Pursuits: Able to track stimulus in all quads without difficulty Saccades: Within functional limits Convergence: Within functional limits Visual Fields: No apparent deficits Perception  Perception: Within Functional Limits Praxis Praxis: Intact Cognition Overall Cognitive Status: History of cognitive impairments - at baseline Arousal/Alertness: Awake/alert Orientation Level: Oriented X4 Attention: Selective Sustained Attention: Appears intact Selective Attention: Appears intact Memory: Impaired Memory Impairment: Retrieval deficit (baseline) Awareness: Appears intact Problem Solving: Appears intact Safety/Judgment: Appears intact Sensation Sensation Light Touch: Appears Intact Hot/Cold: Appears Intact Proprioception: Appears Intact Stereognosis: Appears Intact Additional Comments: BUE Coordination Gross Motor Movements are Fluid and Coordinated: Yes Fine Motor Movements are Fluid and Coordinated: Yes Coordination and Movement Description: grossly WFL for ADL tasks Motor  Motor Motor: Within Functional Limits Motor - Skilled Clinical Observations: Sitting postural control = WFL Motor - Discharge Observations: Sitting balance much improved since [facility] and now demos [facility] Mobility  Bed Mobility Bed Mobility: Supine to Sit;Sit to Supine Rolling Right: Independent with assistive device;Independent Rolling Left: Independent Supine to Sit: Independent with assistive device Sit to Supine: Independent with assistive device Transfers Sit to Stand: Independent with assistive device Stand to Sit: Independent with assistive device  Trunk/Postural Assessment  Cervical Assessment Cervical  Assessment: Exceptions to [facility] (cervical flexion, forward head) Thoracic Assessment Thoracic Assessment: Exceptions to [facility] (rounded shoulders) Lumbar Assessment Lumbar Assessment: Exceptions to [facility] (post pelvic tilt) Postural Control Postural Control: Within Functional Limits  Balance Balance Balance Assessed: Yes Standardized Balance Assessment Standardized Balance Assessment: Berg Balance Test;Timed Up and Go Test Berg Balance Test Sit to Stand: Able to stand  independently using hands Standing Unsupported: Able to stand safely 2 minutes Sitting with Back Unsupported but Feet Supported on Floor or Stool: Able to sit safely and securely  2 minutes Stand to Sit: Controls descent by using hands Transfers: Able to transfer safely, definite need of hands Standing Unsupported with Eyes Closed: Able to stand 10 seconds safely Standing Ubsupported with Feet Together: Able to place feet together independently and stand for 1 minute with supervision From Standing, Reach Forward with Outstretched Arm: Can reach forward >12 cm safely (5") From Standing Position, Pick up Object from Floor: Able to pick up shoe safely and easily From Standing Position, Turn to Look Behind Over each Shoulder: Turn sideways only but maintains balance Turn 360 Degrees: Able to turn 360 degrees safely but slowly Standing Unsupported, Alternately Place Feet on Step/Stool: Able to complete >2 steps/needs minimal assist Standing Unsupported, One Foot in Front: Able to take small step independently and hold 30 seconds Standing on One Leg: Tries to lift leg/unable to hold 3 seconds but remains standing independently Total Score: 39 Timed Up and Go Test TUG: Normal TUG Normal TUG (seconds): 16.46 Static Sitting Balance Static Sitting - Balance Support: Feet supported Static Sitting - Level of Assistance: 6: Modified independent (Device/Increase time) Dynamic Sitting Balance Dynamic Sitting - Balance Support: Feet  supported Dynamic Sitting - Level of Assistance: 6: Modified independent (Device/Increase time) Dynamic Sitting - Balance Activities: Reaching for objects;Reaching for weighted objects;Reaching across midline Static Standing Balance Static Standing - Balance Support: Bilateral upper extremity supported Static Standing - Level of Assistance: 6: Modified independent (Device/Increase time) Dynamic Standing Balance Dynamic Standing - Balance Support: Left upper extremity supported Dynamic Standing - Level of Assistance: 6: Modified independent (Device/Increase time) Dynamic Standing - Balance Activities: Reaching for objects;Reaching for weighted objects;Reaching across midline;Compliant surfaces;Lateral lean/weight shifting;Forward lean/weight shifting Dynamic Standing - Comments: mod I with B or unilateral UE supported Functional Gait  Assessment Gait assessed : No Extremity/Trunk Assessment RUE Assessment RUE Assessment: Within Functional Limits Active Range of Motion (AROM) Comments: 3/4 to full shoulder flexion General Strength Comments: 5/5 grip and RUE   Session Note:  Pt received semi-reclined in bed, agreeable to therapy. Session focus on dynamic standing balance + activity tolerance. Evaluation reassessments completed as documented above. Pt completed bed mobility, donned B shoes, amb to toilet, voided bladder with mod I + rollator. Amb to gym with rollator with mod I. Completed three rounds on BITS game to improve activity tolerance + dynamic standing balance. Completed the following with overall mod I + rollator:  Game: Single Target, User Paced Accuracy: 92.94% Reaction Time: 1.5 Duration Time: 2 min Hits: 79  Accuracy: 97.8% Reaction Time: 1.34 Duration Time: 2 min Hits: 89  Accuracy: 96.59% Reaction Time: 1.4 Duration Time: 2 min Hits: 85 Pt reports he is at 6/10 on modified RPE after activity.   Pt overall demonstrating improved safe rollator use, ind locking breaks.  Getting up from low surfaces without arm rests are more difficult for pt this date, but pt is mod I when using arm rests. Pt edu on re: safe rollator use, RPE + energy conservation (seated rest breaks).  Amb back to room, doffed shoes, completed bed mobility with mod I. Left semi-reclined in bed, bed alarm not engaged as pt is ind in room, call bell in reach, all immediate needs met.   Volanda Napoleon, MS, OTR/L 05/14/2020, 12:48 PM

## 2020-05-14 NOTE — Progress Notes (Addendum)
Physical Therapy Session Note  Patient Details  Name: Steven Meadows MRN: [id number] Date of Birth: 04-05-1932  Today's Date: 05/14/2020 PT Individual Time: 7322-0254 PT Individual Time Calculation (min): 24 min   Short Term Goals: Week 1:  PT Short Term Goal 1 (Week 1): STG=LTG due to ELOS PT Short Term Goal 1 - Progress (Week 1): Progressing toward goal Week 2:  PT Short Term Goal 1 (Week 2): STG=LTG due to ELOS  Skilled Therapeutic Interventions/Progress Updates:   Received pt supine in bed with wife present at bedside and NT present taking vitals, pt agreeable to therapy, and denied any pain during session. Session with emphasis on functional mobility/transfers, generalized strengthening, dynamic standing balance/coordination, ambulation, and improved activity tolerance. Pt reports feeling ready to discharge tomorrow and with no concerns. Pt performed bed mobility mod I using bedrails x 2 trials throughout session and donned/doffed shoes sitting EOB with set up assist and increased time. Pt ambulated 133ft x 2 trials to/from therapy gym with rollator mod I. Pt demonstrates decreased cadence, flexed trunk, and decreased stride length but no LOB noted and good safety awareness. Pt with mild SOB after ambulating requiring rest break to recover. Worked on dynamic standing balance tossing horseshoes without AD and supervision x 2 trials. RN present to administer medications. Concluded session with pt supine in bed with all needs within reach.   Therapy Documentation Precautions:  Precautions Precautions: Fall,Other (comment) Precaution Comments: HOH, STM impairment Restrictions Weight Bearing Restrictions: No   Therapy/Group: Individual Therapy Alfonse Alpers PT, DPT   05/14/2020, 7:17 AM

## 2020-05-14 NOTE — Progress Notes (Signed)
Physical Therapy Discharge Summary  Patient Details  Name: Steven Meadows MRN: [id number] Date of Birth: [birth year]    Patient has met 8 of 10 long term goals due to improved activity tolerance, improved balance, increased strength and improved awareness.  Patient to discharge at an ambulatory level Modified Independent.   Patient's care partner is independent to provide the necessary physical assistance at discharge.  Reasons goals not met: Wheelchair goal is unnecessary as pt is discharging Mod I at ambulatory level. Floor transfer goal deemed inappropriate at this time.   Recommendation:  Patient will benefit from ongoing skilled PT services in home health setting to continue to advance safe functional mobility and to reduce caregiver burden by addressing ongoing impairments in static and dynamic standing balance, activity tolerance, energy conservation awareness, and to minimize fall risk.  Equipment: recommended additional rollator for indoor use, otherwise pt already has SPC, RW and outdoor use rollator   Reasons for discharge: treatment goals met and discharge from hospital  Patient/family agrees with progress made and goals achieved: Yes  PT Discharge Precautions/Restrictions Precautions Precautions: Fall Precaution Comments: HOH, Restrictions Weight Bearing Restrictions: No Vital Signs   Pain Pain Assessment Pain Scale: 0-10 Pain Score: 0-No pain Vision/Perception     Cognition Overall Cognitive Status: Within Functional Limits for tasks assessed Arousal/Alertness: Awake/alert Orientation Level: Oriented X4 Attention: Selective Sustained Attention: Appears intact Selective Attention: Appears intact Memory: Appears intact Awareness: Appears intact Problem Solving: Appears intact Safety/Judgment: Appears intact Sensation   Motor  Motor Motor: Within Functional Limits Motor - Skilled Clinical Observations: Sitting postural control = WFL Motor - Discharge  Observations: Sitting balance much improved since [facility] and now demos [facility]  Mobility Bed Mobility Rolling Right: Independent with assistive device;Independent Rolling Left: Independent Supine to Sit: Independent with assistive device Sit to Supine: Independent with assistive device Transfers Sit to Stand: Independent with assistive device Stand to Sit: Independent with assistive device Stand Pivot Transfers: Independent with assistive device Transfer (Assistive device): Rollator Locomotion  Gait Gait Assistance: Supervision/Verbal cueing Gait Distance (Feet): 250 Feet Assistive device: Rollator Gait Assistance Details: Verbal cues for safe use of DME/AE Gait Gait: Yes Gait Pattern: Impaired Gait Pattern: Decreased step length - right;Decreased step length - left Gait velocity: decreased, however much improved since [facility] Stairs / Additional Locomotion Stairs: Yes Stairs Assistance: Independent with assistive device Stair Management Technique: One rail Left Number of Stairs: 4 Height of Stairs: 6 Ramp: Independent with assistive device Curb: Supervision/Verbal cueing Wheelchair Mobility Wheelchair Mobility: No  Trunk/Postural Assessment  Cervical Assessment Cervical Assessment: Exceptions to [facility] (cervical flexion, forward head) Thoracic Assessment Thoracic Assessment: Exceptions to [facility] (rounded shoulders) Lumbar Assessment Lumbar Assessment:  (post pelvic tilt) Postural Control Postural Control: Within Functional Limits  Balance Balance Balance Assessed: Yes Standardized Balance Assessment Standardized Balance Assessment: Berg Balance Test;Timed Up and Go Test Berg Balance Test Sit to Stand: Able to stand  independently using hands Standing Unsupported: Able to stand safely 2 minutes Sitting with Back Unsupported but Feet Supported on Floor or Stool: Able to sit safely and securely 2 minutes Stand to Sit: Controls descent by using hands Transfers: Able to transfer  safely, definite need of hands Standing Unsupported with Eyes Closed: Able to stand 10 seconds safely Standing Ubsupported with Feet Together: Able to place feet together independently and stand for 1 minute with supervision From Standing, Reach Forward with Outstretched Arm: Can reach forward >12 cm safely (5") From Standing Position, Pick up Object from Floor: Able to  pick up shoe safely and easily From Standing Position, Turn to Look Behind Over each Shoulder: Turn sideways only but maintains balance Turn 360 Degrees: Able to turn 360 degrees safely but slowly Standing Unsupported, Alternately Place Feet on Step/Stool: Able to complete >2 steps/needs minimal assist Standing Unsupported, One Foot in Front: Able to take small step independently and hold 30 seconds Standing on One Leg: Tries to lift leg/unable to hold 3 seconds but remains standing independently Total Score: 39 Timed Up and Go Test TUG: Normal TUG Normal TUG (seconds): 16.46 Static Sitting Balance Static Sitting - Balance Support: Feet supported Static Sitting - Level of Assistance: 6: Modified independent (Device/Increase time) Dynamic Sitting Balance Dynamic Sitting - Balance Support: Feet supported;Left upper extremity supported Dynamic Sitting - Level of Assistance: 6: Modified independent (Device/Increase time) Dynamic Sitting - Balance Activities: Reaching for objects;Reaching for weighted objects;Reaching across midline Static Standing Balance Static Standing - Balance Support: No upper extremity supported Static Standing - Level of Assistance: 5: Stand by assistance Dynamic Standing Balance Dynamic Standing - Balance Support: Left upper extremity supported Dynamic Standing - Level of Assistance: 5: Stand by assistance Dynamic Standing - Balance Activities: Reaching for objects;Reaching for weighted objects;Reaching across midline;Compliant surfaces;Lateral lean/weight shifting;Forward lean/weight shifting Dynamic  Standing - Comments: Continued deficits seen with no UE support, significant improvement with dynamic reaching and safety awareness/ judgment with BUE support Functional Gait  Assessment Gait assessed : No Extremity Assessment      RLE Assessment RLE Assessment: Within Functional Limits General Strength Comments: grossly 4+/5 proximal to distal LLE Assessment LLE Assessment: Within Functional Limits General Strength Comments: grossly 4+/5 proximal to distal    [name] 05/14/2020, 10:04 AM

## 2020-05-14 NOTE — Progress Notes (Signed)
Patient ID: PRADEEP BEAUBRUN, male   DOB: [birth year], 87 y.o.   MRN: [id number]  This SW covering for [place].   SW waiting on updates from Angie/[facility] [phone]) about HHPT/OT referral. States she will follow-up to confirm.   SW called pt wife Benjamine Mola [phone]) to discuss if she has spoken with [facility] as their note states pt is not eligible for a new BSC as he received on 09/2018 and Medicare will only allow one every 5 years. She states she did not receive their message. She is willing to private pay for 3in1 BSC. SW provided contact information for her to follow-up with [facility].  SW informed once there is more information about [facility], SW to follow-up.  *SW received updates that HHPT/OT referral accepted by [facility]. SW called pt wife Benjamine Mola to provide updates on above. She confirms she cancelled shower chair with [facility] as she has one already. D/c instructions updates.   Loralee Pacas, MSW, [place] Office: [phone] Cell: [phone] Fax: [phone]

## 2020-05-14 NOTE — Discharge Instructions (Signed)
Inpatient Rehab Discharge Instructions  Steven Meadows Discharge date and time: No discharge date for patient encounter.   Activities/Precautions/ Functional Status: Activity: activity as tolerated Diet: regular diet Wound Care: Routine skin checks Functional status:  ___ No restrictions     ___ Walk up steps independently ___ 24/7 supervision/assistance   ___ Walk up steps with assistance ___ Intermittent supervision/assistance  ___ Bathe/dress independently ___ Walk with walker     _x__ Bathe/dress with assistance ___ Walk Independently    ___ Shower independently ___ Walk with assistance    ___ Shower with assistance ___ No alcohol     ___ Return to work/school ________  COMMUNITY REFERRALS UPON DISCHARGE:    Home Health:   PT     OT                     Agency: [facility] Phone: [phone]  Special Instructions: Please allow 24-72 hours, excluding weekends for agency to reach out for scheduling.    Medical Equipment/Items Ordered: 3in1 bedside commode                                                 Agency/Supplier: [facility] [phone]    No driving smoking or alcohol  Hold Eliquis until further notice   My questions have been answered and I understand these instructions. I will adhere to these goals and the provided educational materials after my discharge from the hospital.  Patient/Caregiver Signature _______________________________ Date __________  Clinician Signature _______________________________________ Date __________  Please bring this form and your medication list with you to all your follow-up doctor's appointments.

## 2020-05-14 NOTE — Progress Notes (Signed)
Physical Therapy Session Note  Patient Details  Name: Steven Meadows MRN: [id number] Date of Birth: [birth year]  Today's Date: 05/14/2020 Session 1: PT Individual Time: 0832-0922  Session 1:PT Individual Time Calculation (min): 50 min   Short Term Goals: Week 2:  PT Short Term Goal 1 (Week 2): STG=LTG due to ELOS   Skilled Therapeutic Interventions/Progress Updates:    Patient supine in bed upon PT arrival. Patient alert and agreeable to PT discharge session. Patient denied pain during session.  Therapeutic Activity: Pt performs all bed mobility and transfers throughout session with Mod I using rollator. Demos good safety awareness with 1-2 vc required for locking brakes on rollator.   Performed 5xSTS test with improved time of 13.22 sec over his initial time on eval at 47sec. 10 meter walk test maintained at 0.4 m/sec  Gait Training:  Patient ambulated >200 feet/ bout using rollator with Mod I. When cued for changes in walking speed or head turns, he is able to maintain straight path with no adjustments for balance. Pt continues to pause in ambulation during conversations, most likely due to being [facility].  Neuromuscular Re-ed: NMR facilitated this session with  focus on balance during static and dynamic standing balance activities, especially during performance of tasks to complete Berg Balance testing, TUG, and aspects of DGI. Please refer to TUG and Berg Balance scoring below.  Pt is informed that he is now allowed to mobilize in his room without staff present as long as he uses his rollator. Sign posted at doorway in order to inform staff.   Session 2: PT Individual Time: 2505-3976  Session 2:PT Individual Time Calculation (min): 10 min  Education provided in second session re: maintaining regular mobility throughout each day in order to continue to progress in cardiorespiratory fitness and balance. Pointed out to pt that his activity tolerance has improved overall, however, he may still  require energy conservation techniques to complete any chores or activities. Recommended to pt to point out goal for HHPT to improve tolerance to activity.   Pt relates that he has been up to the bathroom twice since being allowed to mobilize in room with no issues and is thankful that he does not have to use the urinal anymore.   Patient supine in bed at end of session with brakes locked and all needs within reach.  Session Total: PT Individual Time Calculation (min): 60 min   Therapy Documentation Precautions:  Precautions Precautions: Fall Precaution Comments: HOH, Restrictions Weight Bearing Restrictions: No General:   Vital Signs:   Pain: Pain Assessment Pain Scale: 0-10 Pain Score: 0-No pain Mobility: Bed Mobility Rolling Right: Independent with assistive device;Independent Rolling Left: Independent Supine to Sit: Independent with assistive device Sit to Supine: Independent with assistive device Transfers Sit to Stand: Independent with assistive device Stand to Sit: Independent with assistive device Stand Pivot Transfers: Independent with assistive device Transfer (Assistive device): Rollator Locomotion : Gait Gait Assistance: Supervision/Verbal cueing Gait Distance (Feet): 250 Feet Assistive device: Rollator Gait Assistance Details: Verbal cues for safe use of DME/AE Gait Gait: Yes Gait Pattern: Impaired Gait Pattern: Decreased step length - right;Decreased step length - left Gait velocity: decreased, however much improved since [facility] Stairs / Additional Locomotion Stairs: Yes Stairs Assistance: Independent with assistive device Stair Management Technique: One rail Left Number of Stairs: 4 Height of Stairs: 6 Ramp: Independent with assistive device Curb: Supervision/Verbal cueing Wheelchair Mobility Wheelchair Mobility: No  Trunk/Postural Assessment : Cervical Assessment Cervical Assessment: Exceptions to [facility] (cervical  flexion, forward head) Thoracic  Assessment Thoracic Assessment: Exceptions to [facility] (rounded shoulders) Lumbar Assessment Lumbar Assessment:  (post pelvic tilt) Postural Control Postural Control: Within Functional Limits  Balance: Balance Balance Assessed: Yes Standardized Balance Assessment Standardized Balance Assessment: Berg Balance Test;Timed Up and Go Test Berg Balance Test Sit to Stand: Able to stand  independently using hands Standing Unsupported: Able to stand safely 2 minutes Sitting with Back Unsupported but Feet Supported on Floor or Stool: Able to sit safely and securely 2 minutes Stand to Sit: Controls descent by using hands Transfers: Able to transfer safely, definite need of hands Standing Unsupported with Eyes Closed: Able to stand 10 seconds safely Standing Ubsupported with Feet Together: Able to place feet together independently and stand for 1 minute with supervision From Standing, Reach Forward with Outstretched Arm: Can reach forward >12 cm safely (5") From Standing Position, Pick up Object from Floor: Able to pick up shoe safely and easily From Standing Position, Turn to Look Behind Over each Shoulder: Turn sideways only but maintains balance Turn 360 Degrees: Able to turn 360 degrees safely but slowly Standing Unsupported, Alternately Place Feet on Step/Stool: Able to complete >2 steps/needs minimal assist Standing Unsupported, One Foot in Front: Able to take small step independently and hold 30 seconds Standing on One Leg: Tries to lift leg/unable to hold 3 seconds but remains standing independently Total Score: 39 Timed Up and Go Test TUG: Normal TUG Normal TUG (seconds): 16.46 Static Sitting Balance Static Sitting - Balance Support: Feet supported Static Sitting - Level of Assistance: 6: Modified independent (Device/Increase time) Dynamic Sitting Balance Dynamic Sitting - Balance Support: Feet supported;Left upper extremity supported Dynamic Sitting - Level of Assistance: 6: Modified  independent (Device/Increase time) Dynamic Sitting - Balance Activities: Reaching for objects;Reaching for weighted objects;Reaching across midline Static Standing Balance Static Standing - Balance Support: No upper extremity supported Static Standing - Level of Assistance: 5: Stand by assistance Dynamic Standing Balance Dynamic Standing - Balance Support: Left upper extremity supported Dynamic Standing - Level of Assistance: 5: Stand by assistance Dynamic Standing - Balance Activities: Reaching for objects;Reaching for weighted objects;Reaching across midline;Compliant surfaces;Lateral lean/weight shifting;Forward lean/weight shifting Dynamic Standing - Comments: Continued deficits seen with no UE support, significant improvement with dynamic reaching and safety awareness/ judgment with BUE support Functional Gait  Assessment Gait assessed : No     Therapy/Group: Individual Therapy  [name] 05/14/2020, 10:28 AM

## 2020-05-14 NOTE — Progress Notes (Signed)
[place] PHYSICAL MEDICINE & REHABILITATION PROGRESS NOTE   Subjective/Complaints: No issues overnite, is now Mood I in room, feels comfortable about going home in am   ROS: Patient denies CP, SOB, N/V/D.    Objective:   No results found. No results for input(s): WBC, HGB, HCT, PLT in the last 72 hours. No results for input(s): NA, K, CL, CO2, GLUCOSE, BUN, CREATININE, CALCIUM in the last 72 hours.  Intake/Output Summary (Last 24 hours) at 05/14/2020 0855 Last data filed at 05/14/2020 8119 Gross per 24 hour  Intake 238 ml  Output 1075 ml  Net -837 ml        Physical Exam: Vital Signs Blood pressure 137/75, pulse 85, temperature 98.2 F (36.8 C), temperature source Oral, resp. rate 18, height 5\' 11"  (1.803 m), weight 98.5 kg, SpO2 94 %.   General: No acute distress Mood and affect are appropriate Heart: Regular rate and rhythm no rubs murmurs or extra sounds Lungs: Clear to auscultation, breathing unlabored, no rales or wheezes Abdomen: Positive bowel sounds, soft nontender to palpation, nondistended Extremities: No clubbing, cyanosis, or edema Skin: No evidence of breakdown, no evidence of rash   Skin: intact, warm Musc: No edema in extremities.  No tenderness in extremities. Neuro: Alert. oriented Motor: 5/5 throughout, stable Sensation reduce proprio> LT in L>R LE.   Assessment/Plan: 1. Functional deficits which require 3+ hours per day of interdisciplinary therapy in a comprehensive inpatient rehab setting.  Physiatrist is providing close team supervision and 24 hour management of active medical problems listed below.  Physiatrist and rehab team continue to assess barriers to discharge/monitor patient progress toward functional and medical goals  Care Tool:  Bathing    Body parts bathed by patient: Face,Right arm,Left arm,Chest,Abdomen,Front perineal area,Right upper leg,Left upper leg,Buttocks,Left lower leg,Right lower leg   Body parts bathed by helper:  Right lower leg,Left lower leg     Bathing assist Assist Level: Independent with assistive device     Upper Body Dressing/Undressing Upper body dressing   What is the patient wearing?: Pull over shirt    Upper body assist Assist Level: Independent with assistive device    Lower Body Dressing/Undressing Lower body dressing      What is the patient wearing?: Underwear/pull up,Pants     Lower body assist Assist for lower body dressing: Independent with assitive device     Toileting Toileting    Toileting assist Assist for toileting: Independent with assistive device     Transfers Chair/bed transfer  Transfers assist     Chair/bed transfer assist level: Supervision/Verbal cueing Chair/bed transfer assistive device: Programmer, multimedia   Ambulation assist      Assist level: Supervision/Verbal cueing Assistive device: Rollator Max distance: 150   Walk 10 feet activity   Assist     Assist level: Supervision/Verbal cueing Assistive device: Rollator   Walk 50 feet activity   Assist    Assist level: Supervision/Verbal cueing Assistive device: Rollator    Walk 150 feet activity   Assist Walk 150 feet activity did not occur: Safety/medical concerns  Assist level: Supervision/Verbal cueing Assistive device: Rollator    Walk 10 feet on uneven surface  activity   Assist Walk 10 feet on uneven surfaces activity did not occur: Safety/medical concerns         Wheelchair     Assist Will patient use wheelchair at discharge?: No Type of Wheelchair: Manual    Wheelchair assist level: Minimal Assistance - Patient > 75% Max  wheelchair distance: 150    Wheelchair 50 feet with 2 turns activity    Assist        Assist Level: Minimal Assistance - Patient > 75%   Wheelchair 150 feet activity     Assist      Assist Level: Minimal Assistance - Patient > 75%   Blood pressure 137/75, pulse 85, temperature 98.2 F  (36.8 C), temperature source Oral, resp. rate 18, height 5\' 11"  (1.803 m), weight 98.5 kg, SpO2 94 %.   Medical Problem List and Plan: 1.  Altered mental status with dysarthric speech secondary to small left thalamic hemorrhage in setting of Eliquis coagulopathy  Continue CIR PT, OT, SLP  tent d/c date 1/11  Plan is to use RW in house and rollator outdoors   2.  Antithrombotics: -DVT/anticoagulation: SCDs             -antiplatelet therapy: N/A 3. Pain Management: Tylenol as needed 4. Mood: Provide emotional support             -antipsychotic agents: N/A 5. Neuropsych: This patient he is capable of making decisions on his own behalf. 6. Skin/Wound Care: Routine skin checks 7. Fluids/Electrolytes/Nutrition: Routine I/Os. 8.  Hypertension.  Continue Cardizem 240 mg daily, hydralazine 25 mg twice daily, Maxide 1 tablet daily.  Monitor with increased mobility Vitals:   05/13/20 1942 05/14/20 0454  BP: (!) 132/94 137/75  Pulse: 68 85  Resp: 18 18  Temp: 97.8 F (36.6 C) 98.2 F (36.8 C)  SpO2: 97% 94%   1/10 Controlled after increasing hydralazine to 25mg  TID 9.  Atrial fibrillation.  Continue Cardizem.  Cardiac rate controlled.  Eliquis discontinued due to thalamic hemorrhage 10.  Hyperlipidemia.  Zetia 11.  Hypokalemia- Likely related to maxide will supplement  K+ normal at 3.7 cont KCL  12. Hyponatremia Improving 1/3 13. AKI  Resolved creat 1.09 1/3 14. Constipation resolved now with freq loose BMs will change senna to qhs prn   -stooling has stopped   -resume HS senna  LOS: 11 days A FACE TO FACE EVALUATION WAS PERFORMED  Charlett Blake 05/14/2020, 8:55 AM

## 2020-05-14 NOTE — Progress Notes (Signed)
Physical Therapy Session Note  Patient Details  Name: Steven Meadows MRN: [id number] Date of Birth: 1932/02/09  Today's Date: 05/14/2020 PT Individual Time: 1530-1600 PT Individual Time Calculation (min): 30 min   Short Term Goals: Week 2:  PT Short Term Goal 1 (Week 2): STG=LTG due to ELOS  Skilled Therapeutic Interventions/Progress Updates:    Pt received supine in bed, agreeable to PT session. No complaints of pain. Bed mobility independent. Pt able to don shoes while seated EOB. Sit to stand at mod I level to rollator throughout session. Ambulation up to 200 ft during session with rollator at mod I level. Ascend/descend 12 x 6" stairs with 2 handrails at Supervision level with cues for safe LE management. Ambulation across uneven ground with rollator at [facility] level for safety. Ascend/descend one 6" curb with rollator with CGA needed for safety. Pt returned to bed at end of session, independent for bed mobility. Pt left semi-reclined in bed with needs in reach at end of session.  Therapy Documentation Precautions:  Precautions Precautions: Fall Precaution Comments: HOH, Restrictions Weight Bearing Restrictions: No    Therapy/Group: Individual Therapy   Excell Seltzer, PT, DPT  05/14/2020, 5:13 PM

## 2020-05-15 NOTE — Progress Notes (Signed)
[place] PHYSICAL MEDICINE & REHABILITATION PROGRESS NOTE   Subjective/Complaints: No complaints this morning, stayed up late to watch the football game, wife is at bedside.  Discussed need for PMR follow-up appointment  ROS: Patient denies CP, SOB, N/V/D.    Objective:   No results found. No results for input(s): WBC, HGB, HCT, PLT in the last 72 hours. No results for input(s): NA, K, CL, CO2, GLUCOSE, BUN, CREATININE, CALCIUM in the last 72 hours.  Intake/Output Summary (Last 24 hours) at 05/15/2020 0906 Last data filed at 05/14/2020 2000 Gross per 24 hour  Intake 480 ml  Output -  Net 480 ml        Physical Exam: Vital Signs Blood pressure 134/83, pulse 75, temperature 97.8 F (36.6 C), resp. rate 18, height 5\' 11"  (1.803 m), weight 98.5 kg, SpO2 95 %.    General: No acute distress Mood and affect are appropriate Heart: Regular rate and rhythm no rubs murmurs or extra sounds Lungs: Clear to auscultation, breathing unlabored, no rales or wheezes Abdomen: Positive bowel sounds, soft nontender to palpation, nondistended Extremities: No clubbing, cyanosis, or edema Skin: No evidence of breakdown, no evidence of rash   Skin: intact, warm Musc: No edema in extremities.  No tenderness in extremities. Neuro: Alert. oriented Motor: 5/5 throughout, stable Sensation reduce proprio> LT in L>R LE.   Assessment/Plan: 1. Functional deficits due to CVA Stable for D/C today F/u PCP in 3-4 weeks F/u PM&R 2 weeks See D/C summary See D/C instructions Care Tool:  Bathing    Body parts bathed by patient: Face,Right arm,Left arm,Chest,Abdomen,Front perineal area,Right upper leg,Left upper leg,Buttocks,Left lower leg,Right lower leg   Body parts bathed by helper: Right lower leg,Left lower leg     Bathing assist Assist Level: Independent with assistive device     Upper Body Dressing/Undressing Upper body dressing   What is the patient wearing?: Pull over shirt    Upper  body assist Assist Level: Independent with assistive device    Lower Body Dressing/Undressing Lower body dressing      What is the patient wearing?: Underwear/pull up,Pants     Lower body assist Assist for lower body dressing: Independent with assitive device     Toileting Toileting    Toileting assist Assist for toileting: Independent with assistive device     Transfers Chair/bed transfer  Transfers assist     Chair/bed transfer assist level: Independent with assistive device Chair/bed transfer assistive device: Bedrails,Other (Has 4 poster bad at home and uses posters for UE assist)   Locomotion Ambulation   Ambulation assist      Assist level: Independent with assistive device Assistive device: Rollator Max distance: >261ft   Walk 10 feet activity   Assist     Assist level: Independent with assistive device Assistive device: Rollator   Walk 50 feet activity   Assist    Assist level: Independent with assistive device Assistive device: Rollator    Walk 150 feet activity   Assist Walk 150 feet activity did not occur: Safety/medical concerns  Assist level: Independent with assistive device Assistive device: Rollator    Walk 10 feet on uneven surface  activity   Assist Walk 10 feet on uneven surfaces activity did not occur: Safety/medical concerns   Assist level: Supervision/Verbal cueing Assistive device: Rollator   Wheelchair     Assist Will patient use wheelchair at discharge?: No Type of Wheelchair: Manual    Wheelchair assist level: Minimal Assistance - Patient > 75% Max wheelchair  distance: 150    Wheelchair 50 feet with 2 turns activity    Assist        Assist Level: Minimal Assistance - Patient > 75%   Wheelchair 150 feet activity     Assist      Assist Level: Minimal Assistance - Patient > 75%   Blood pressure 134/83, pulse 75, temperature 97.8 F (36.6 C), resp. rate 18, height 5\' 11"  (1.803 m),  weight 98.5 kg, SpO2 95 %.   Medical Problem List and Plan: 1.  Altered mental status with dysarthric speech secondary to small left thalamic hemorrhage in setting of Eliquis coagulopathy  Discharge home today  Plan is to use RW in house and rollator outdoors  The patient has not been driving for about 4 or 5 years due to his peripheral neuropathy 2.  Antithrombotics: -DVT/anticoagulation: SCDs             -antiplatelet therapy: N/A 3. Pain Management: Tylenol as needed 4. Mood: Provide emotional support             -antipsychotic agents: N/A 5. Neuropsych: This patient he is capable of making decisions on his own behalf. 6. Skin/Wound Care: Routine skin checks 7. Fluids/Electrolytes/Nutrition: Routine I/Os. 8.  Hypertension.  Continue Cardizem 240 mg daily, hydralazine 25 mg twice daily, Maxide 1 tablet daily.  Monitor with increased mobility Vitals:   05/14/20 1925 05/15/20 0431  BP: 128/86 134/83  Pulse: 66 75  Resp: 18 18  Temp: 98.1 F (36.7 C) 97.8 F (36.6 C)  SpO2: 95% 95%   1/11 Controlled after increasing hydralazine to 25mg  TID 9.  Atrial fibrillation.  Continue Cardizem.  Cardiac rate controlled.  Eliquis discontinued due to thalamic hemorrhage 10.  Hyperlipidemia.  Zetia 11.  Hypokalemia- Likely related to maxide will supplement  K+ normal at 3.7 cont KCL  12. Hyponatremia Improving 1/3 13. AKI  Resolved creat 1.09 1/3 14. Constipation resolved now with freq loose BMs will change senna to qhs prn   -stooling has stopped   -resume HS senna  LOS: 12 days A FACE TO FACE EVALUATION WAS PERFORMED  Charlett Blake 05/15/2020, 9:06 AM

## 2020-05-15 NOTE — Progress Notes (Signed)
Inpatient Rehabilitation Care Coordinator Discharge Note  The overall goal for the admission was met for:   Discharge location: Yes, Home  Length of Stay: Yes, 12 Days  Discharge activity level: Yes  Home/community participation: Yes  Services provided included: MD, RD, PT, OT, SLP, RN, CM, TR, Pharmacy, Neuropsych and SW  Financial Services: Medicare  Choices offered to/list presented IP:[id number]   Follow-up services arranged: Home Health: [facility]  Comments (or additional information): Pt/Ot  Patient/Family verbalized understanding of follow-up arrangements: Yes  Individual responsible for coordination of the follow-up plan: spouse [phone]  Confirmed correct DME delivered: Dyanne Iha 05/15/2020    Dyanne Iha

## 2020-05-15 NOTE — Progress Notes (Signed)
Orthopedic Tech Progress Note Patient Details:  Steven Meadows 25-Jan-1932 [id number] Left abdom. Binder bedside Ortho Devices Type of Ortho Device: Abdominal binder Ortho Device/Splint Interventions: Ordered       [name] [name] 05/15/2020, 9:16 AM

## 2020-05-15 NOTE — Plan of Care (Signed)
  Problem: Consults Goal: RH STROKE PATIENT EDUCATION Description: See Patient Education module for education specifics  Outcome: Completed/Met   Problem: RH SAFETY Goal: RH STG ADHERE TO SAFETY PRECAUTIONS W/ASSISTANCE/DEVICE Description: STG Adhere to Safety Precautions With  cues/reminders Assistance/Device. Outcome: Completed/Met   Problem: RH KNOWLEDGE DEFICIT Goal: RH STG INCREASE KNOWLEDGE OF HYPERTENSION Outcome: Completed/Met Goal: RH STG INCREASE KNOWLEGDE OF HYPERLIPIDEMIA Description: Patient will be able to manage HLD with medications and dietary modifications using handouts and educational materials with cues, reminders Outcome: Completed/Met Goal: RH STG INCREASE KNOWLEDGE OF STROKE PROPHYLAXIS Description: Patient will be able to manage secondary stroke risks with medications and dietary modifications using handouts and educational materials with cues, reminders Outcome: Completed/Met

## 2020-05-16 DIAGNOSIS — I69011 Memory deficit following nontraumatic subarachnoid hemorrhage: Secondary | ICD-10-CM | POA: Diagnosis not present

## 2020-05-16 DIAGNOSIS — Z9181 History of falling: Secondary | ICD-10-CM | POA: Diagnosis not present

## 2020-05-16 DIAGNOSIS — I69122 Dysarthria following nontraumatic intracerebral hemorrhage: Secondary | ICD-10-CM | POA: Diagnosis not present

## 2020-05-16 DIAGNOSIS — G6289 Other specified polyneuropathies: Secondary | ICD-10-CM | POA: Diagnosis not present

## 2020-05-16 DIAGNOSIS — I726 Aneurysm of vertebral artery: Secondary | ICD-10-CM | POA: Diagnosis not present

## 2020-05-16 DIAGNOSIS — I4891 Unspecified atrial fibrillation: Secondary | ICD-10-CM | POA: Diagnosis not present

## 2020-05-16 DIAGNOSIS — I10 Essential (primary) hypertension: Secondary | ICD-10-CM | POA: Diagnosis not present

## 2020-05-16 DIAGNOSIS — I69398 Other sequelae of cerebral infarction: Secondary | ICD-10-CM | POA: Diagnosis not present

## 2020-05-16 DIAGNOSIS — Z8673 Personal history of transient ischemic attack (TIA), and cerebral infarction without residual deficits: Secondary | ICD-10-CM | POA: Diagnosis not present

## 2020-05-16 DIAGNOSIS — I69120 Aphasia following nontraumatic intracerebral hemorrhage: Secondary | ICD-10-CM | POA: Diagnosis not present

## 2020-05-16 DIAGNOSIS — M545 Low back pain, unspecified: Secondary | ICD-10-CM | POA: Diagnosis not present

## 2020-05-17 ENCOUNTER — Telehealth: Payer: Self-pay | Admitting: Registered Nurse

## 2020-05-17 NOTE — Telephone Encounter (Signed)
TC Call Placed: No Answer: Left message to return the call.

## 2020-05-17 NOTE — Telephone Encounter (Signed)
Transitional Care call Transitional Questions Answered by Mrs. Olmo  Patient name: [name] DOB: 02/17/1932 1. Are you/is patient experiencing any problems since coming home? No a. Are there any questions regarding any aspect of care? No 2. Are there any questions regarding medications administration/dosing? No a. Are meds being taken as prescribed? Yes b. "Patient should review meds with caller to confirm" Medication List Reviewed. 3. Have there been any falls? No 4. Has Home Health been to the house and/or have they contacted you? Yes, [facility] a. If not, have you tried to contact them? NA b. Can we help you contact them? NA 5. Are bowels and bladder emptying properly? Yes a. Are there any unexpected incontinence issues? No b. If applicable, is patient following bowel/bladder programs? NA 6. Any fevers, problems with breathing, unexpected pain? No 7. Are there any skin problems or new areas of breakdown? No 8. Has the patient/family member arranged specialty MD follow up (ie cardiology/neurology/renal/surgical/etc.)?  He has a scheduled appointment with Dr Leonie Man. This provider will speak with Linna Hoff regarding Dr. Bronson Ing, this office is in [place] and Mrs. Zeringue states they live in [place]. I will give her a call with an update, she verbalizes undrestanding.  a. Can we help arrange? See above 9. Does the patient need any other services or support that we can help arrange? No 10. Are caregivers following through as expected in assisting the patient? Yes 11. Has the patient quit smoking, drinking alcohol, or using drugs as recommended? (                        )  Appointment date/time 0203/2021  arrival time 1:00 and scheduled appointment at 1:15 with Dr Letta Pate. At [place]

## 2020-05-18 ENCOUNTER — Telehealth: Payer: Self-pay | Admitting: Registered Nurse

## 2020-05-18 NOTE — Telephone Encounter (Signed)
This provider was in contact with Linna Hoff regarding the cardiology referral. Linna Hoff stated it was up to Mr. And Mrs Quiroa regarding the cardiology referral. Placed a call to Mrs. Kwasnik no answer, left message regarding the above,

## 2020-05-21 DIAGNOSIS — I69120 Aphasia following nontraumatic intracerebral hemorrhage: Secondary | ICD-10-CM | POA: Diagnosis not present

## 2020-05-21 DIAGNOSIS — I69398 Other sequelae of cerebral infarction: Secondary | ICD-10-CM | POA: Diagnosis not present

## 2020-05-21 DIAGNOSIS — I4891 Unspecified atrial fibrillation: Secondary | ICD-10-CM | POA: Diagnosis not present

## 2020-05-21 DIAGNOSIS — I69011 Memory deficit following nontraumatic subarachnoid hemorrhage: Secondary | ICD-10-CM | POA: Diagnosis not present

## 2020-05-21 DIAGNOSIS — I69122 Dysarthria following nontraumatic intracerebral hemorrhage: Secondary | ICD-10-CM | POA: Diagnosis not present

## 2020-05-21 DIAGNOSIS — G6289 Other specified polyneuropathies: Secondary | ICD-10-CM | POA: Diagnosis not present

## 2020-05-22 DIAGNOSIS — I4891 Unspecified atrial fibrillation: Secondary | ICD-10-CM | POA: Diagnosis not present

## 2020-05-22 DIAGNOSIS — G6289 Other specified polyneuropathies: Secondary | ICD-10-CM | POA: Diagnosis not present

## 2020-05-22 DIAGNOSIS — I69398 Other sequelae of cerebral infarction: Secondary | ICD-10-CM | POA: Diagnosis not present

## 2020-05-22 DIAGNOSIS — I69122 Dysarthria following nontraumatic intracerebral hemorrhage: Secondary | ICD-10-CM | POA: Diagnosis not present

## 2020-05-22 DIAGNOSIS — I69120 Aphasia following nontraumatic intracerebral hemorrhage: Secondary | ICD-10-CM | POA: Diagnosis not present

## 2020-05-22 DIAGNOSIS — I69011 Memory deficit following nontraumatic subarachnoid hemorrhage: Secondary | ICD-10-CM | POA: Diagnosis not present

## 2020-05-23 DIAGNOSIS — I679 Cerebrovascular disease, unspecified: Secondary | ICD-10-CM | POA: Diagnosis not present

## 2020-05-23 DIAGNOSIS — I619 Nontraumatic intracerebral hemorrhage, unspecified: Secondary | ICD-10-CM | POA: Diagnosis not present

## 2020-05-23 DIAGNOSIS — I4891 Unspecified atrial fibrillation: Secondary | ICD-10-CM | POA: Diagnosis not present

## 2020-05-23 DIAGNOSIS — D6869 Other thrombophilia: Secondary | ICD-10-CM | POA: Diagnosis not present

## 2020-05-23 DIAGNOSIS — I10 Essential (primary) hypertension: Secondary | ICD-10-CM | POA: Diagnosis not present

## 2020-05-24 DIAGNOSIS — I4891 Unspecified atrial fibrillation: Secondary | ICD-10-CM | POA: Diagnosis not present

## 2020-05-24 DIAGNOSIS — G6289 Other specified polyneuropathies: Secondary | ICD-10-CM | POA: Diagnosis not present

## 2020-05-24 DIAGNOSIS — I69398 Other sequelae of cerebral infarction: Secondary | ICD-10-CM | POA: Diagnosis not present

## 2020-05-24 DIAGNOSIS — I69011 Memory deficit following nontraumatic subarachnoid hemorrhage: Secondary | ICD-10-CM | POA: Diagnosis not present

## 2020-05-24 DIAGNOSIS — I69122 Dysarthria following nontraumatic intracerebral hemorrhage: Secondary | ICD-10-CM | POA: Diagnosis not present

## 2020-05-24 DIAGNOSIS — I69120 Aphasia following nontraumatic intracerebral hemorrhage: Secondary | ICD-10-CM | POA: Diagnosis not present

## 2020-05-30 ENCOUNTER — Other Ambulatory Visit: Payer: Self-pay

## 2020-05-30 ENCOUNTER — Encounter: Payer: Self-pay | Admitting: Neurology

## 2020-05-30 ENCOUNTER — Ambulatory Visit (INDEPENDENT_AMBULATORY_CARE_PROVIDER_SITE_OTHER): Payer: Medicare Other | Admitting: Neurology

## 2020-05-30 VITALS — BP 169/103 | HR 94 | Ht 71.0 in | Wt 220.2 lb

## 2020-05-30 DIAGNOSIS — I4891 Unspecified atrial fibrillation: Secondary | ICD-10-CM | POA: Diagnosis not present

## 2020-05-30 DIAGNOSIS — G6289 Other specified polyneuropathies: Secondary | ICD-10-CM | POA: Diagnosis not present

## 2020-05-30 DIAGNOSIS — I69122 Dysarthria following nontraumatic intracerebral hemorrhage: Secondary | ICD-10-CM | POA: Diagnosis not present

## 2020-05-30 DIAGNOSIS — I69011 Memory deficit following nontraumatic subarachnoid hemorrhage: Secondary | ICD-10-CM | POA: Diagnosis not present

## 2020-05-30 DIAGNOSIS — I482 Chronic atrial fibrillation, unspecified: Secondary | ICD-10-CM | POA: Diagnosis not present

## 2020-05-30 DIAGNOSIS — I610 Nontraumatic intracerebral hemorrhage in hemisphere, subcortical: Secondary | ICD-10-CM | POA: Diagnosis not present

## 2020-05-30 DIAGNOSIS — R260 Ataxic gait: Secondary | ICD-10-CM | POA: Diagnosis not present

## 2020-05-30 DIAGNOSIS — I69120 Aphasia following nontraumatic intracerebral hemorrhage: Secondary | ICD-10-CM | POA: Diagnosis not present

## 2020-05-30 DIAGNOSIS — I69398 Other sequelae of cerebral infarction: Secondary | ICD-10-CM | POA: Diagnosis not present

## 2020-05-30 NOTE — Patient Instructions (Signed)
I had a long discussion with the patient and his wife regarding his recent thalamic hemorrhage related to anticoagulation with Eliquis and history of atrial fibrillation and ongoing risk for recurrent strokes and embolism.  We discussed treatment options including no anticoagulation versus starting aspirin or going back on Eliquis as well as considering participation in the ASPIRE study to determine whether aspirin versus anticoagulation with Eliquis is superior in preventing recurrent strokes or brain hemorrhages.  He was given written information to review about the study and decide.  Maintain strict control of hypertension with blood pressure goal below 30/90 and lipids with LDL cholesterol goal below 70 mg percent.  He will return for follow-up in the future in 6 months or call earlier if necessary.

## 2020-05-30 NOTE — Progress Notes (Signed)
[place]  [facility] [place]. [place] [address] [phone]       OFFICE FOLLOW-UP NOTE  [name] Date of Birth:  1932/01/15 Medical Record Number:  [id number]   HPI: Virtual video visit 09/23/2018 : Steven Meadows is a 87 year old pleasant Caucasian male seen today for initial virtual video consultation for stroke.  History is obtained from the patient and review of electronic medical records.  I have personally reviewed imaging films in PACS.  He developed sudden onset of acute weakness in his legs left greater than right on 09/02/2018.  He had trouble getting up from a chair and needed to hold onto things.  He went to sleep but the next day when he woke up he fell down and noticed left arm weakness as well in his left leg weakness.  CT scan of the head in the emergency room showed no acute findings.  MRI scan was obtained which showed 1 cm right periventricular white matter infarct.  Patient had history of atrial fibrillation and was on Eliquis and had been compliant with his medication.  CT angiogram showed no emergent large vessel stenosis or occlusion.  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  Transthoracic echo showed normal ejection fraction.  Cardiac monitoring showed atrial fibrillation.  LDL cholesterol was 54 mg percent.  Hemoglobin A1c was 6.0.  Patient was discharged home and aspirin was added.  Patient states is done well since discharge.  His left-sided strength has improved.  He does use a walker to walk outdoors but can walk short distances by himself.  He is feels his balance is not completely recovered particularly when he gets up suddenly or makes quick movement.  He is currently getting home physical and occupational therapy.  His blood pressure is well controlled and today it is 135/75.  He has no new complaints.  He does have a past neurological history of right  splenium white matter lacunar infarct in August 2016 and had seen Dr. Erlinda Hong at that time.  He had made good recovery from that Past medical history atrial fibrillation aneurysm of vertebral artery, hypertension, hyperlipidemia, lacunar stroke, low back pain, rotator cuff tear. Past surgical history cataract extraction right eye, tonsillectomy, adenoidectomy, Family history heart attack in grand parents. Social history has never smoked uses alcohol socially. Medication allergy Flonase and nabumetone  Update 01/26/2019 : He returns for follow-up today after virtual video visit 4 months ago.  He states he continues to have mild gait difficulties and imbalance.  He has had no falls or injuries.  He has been working in the yard but states his stamina is not good.  He can barely walk for about 10 minutes and then asked to rest for a few minutes.  He is not consistent with using a cane and uses mostly outdoors occasionally.  Patient is feels that he is had weakness as well as lack of sensation in his feet for the last 7 months or so.  In fact on couple of occasions while driving his foot slipped off the gas pedal accidentally.  He has in fact given up driving now.  He has not been evaluated for neuropathy.  He remains on Eliquis which is tolerating well without significant bleeding and only minor bruising.  He has discontinued aspirin.  His blood pressure is quite well controlled at home though today it is borderline in office at  146/101.  He remains on Crestor which is tolerating well without muscle aches and pains.  He denies significant decrease in appetite or weight loss.  Does not drink excessive amounts of alcohol and does not have diabetes.  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 Update 05/30/2020 : Patient seen for follow-up today after last visit more than a year ago.  He was hospitalized at [facility] on 04/30/2020 with sudden onset of aphasia and confusion.  He had garbled speech on  admission.  No lateralized weakness.  CT scan showed a small left thalamic hyperdensity with aspect score of 10.  CT angiogram showed stable appearance of the 6 mm right vertebral basilar junction aneurysm.  MRI scan showed small left thalamic and pontine late subacute hemorrhage.  2D echo showed normal ejection fraction.  LDL cholesterol is 31 mg percent.  Hemoglobin A1c was 6.0.  Patient was on Eliquis prior to admission which was reversed with Andexxa and all antithrombotics and anticoagulants were stopped.  Patient was transferred to inpatient rehab is currently living at home.  Is still getting home physical therapy though occupational and speech therapy have finished.  He was previously walking with a cane and now is walking with a wheeled walker.  Can use a cane indoors but uses walker most of the time.  His blood pressures well controlled at home in the 409 systolic range this morning he missed taking his medicine and it is elevated in office at 169/103.  He has no other complaints.  He has questions about going back on aspirin versus Eliquis and after discussion is willing to consider possible participation in the   ASPIRE study ROS:   14 system review of systems is positive for  Ataxia, imbalance, gait difficulties, tiredness, decrease stamina and all other systems negative.  PMH:  Past Medical History:  Diagnosis Date  . Abdominal aortic atherosclerosis ([place])   . Actinic keratoses   . 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 Atrial fibrillation ([place])   . Cough   . History of mononucleosis   . HTN (hypertension)   . Hypercholesteremia   . Lacunar stroke ([place])   . Low back pain   . Olecranon bursitis   . Rotator cuff tear   . Stroke [facility])     Social History:  Social History   Socioeconomic History  . Marital status: Married    Spouse name: Benjamine Mola  . Number of children: Not on file  . Years of education: Not on file  . Highest education level: Not on file  Occupational History   . Not on file  Tobacco Use  . Smoking status: Never Smoker  . Smokeless tobacco: Never Used  Vaping Use  . Vaping Use: Never used  Substance and Sexual Activity  . Alcohol use: Yes    Alcohol/week: 0.0 standard drinks    Comment: 2 drinks per night  . Drug use: No  . Sexual activity: Not on file  Other Topics Concern  . Not on file  Social History Narrative   Lives with wife   Right Handed   Drinks 2-3 cups caffeine daily   Social Determinants of Health   Financial Resource Strain: Not on file  Food Insecurity: Not on file  Transportation Needs: Not on file  Physical Activity: Not on file  Stress: Not on file  Social Connections: Not on file  Intimate Partner Violence: Not on file    Medications:   Current Outpatient Medications  on File Prior to Visit  Medication Sig Dispense Refill  . acetaminophen (TYLENOL) 325 MG tablet Take 2 tablets (650 mg total) by mouth every 4 (four) hours as needed for mild pain (or temp > 37.5 C (99.5 F)).    Marland Kitchen diltiazem (CARDIZEM CD) 240 MG 24 hr capsule Take 1 capsule (240 mg total) by mouth daily. 30 capsule 0  . ezetimibe (ZETIA) 10 MG tablet Take 1 tablet (10 mg total) by mouth daily. 30 tablet 0  . hydrALAZINE (APRESOLINE) 25 MG tablet Take 1 tablet (25 mg total) by mouth every 8 (eight) hours. 90 tablet 0  . omeprazole (PRILOSEC) 40 MG capsule Take 40 mg by mouth daily.    . polyethylene glycol (MIRALAX / GLYCOLAX) 17 g packet Take 17 g by mouth 2 (two) times daily. 14 each 0  . potassium chloride (KLOR-CON) 10 MEQ tablet Take 1 tablet (10 mEq total) by mouth daily. 30 tablet 0  . senna-docusate (SENOKOT-S) 8.6-50 MG tablet Take 1 tablet by mouth 2 (two) times daily. 30 tablet 0  . triamterene-hydrochlorothiazide (MAXZIDE-25) 37.5-25 MG tablet Take 1 tablet by mouth daily. 30 tablet 0   No current facility-administered medications on file prior to visit.    Allergies:   Allergies  Allergen Reactions  . Nabumetone Other (See  Comments)    Gi upset  . Ace Inhibitors Cough  . Flonase [Fluticasone Propionate] Other (See Comments)    Causes nosebleeds    Physical Exam General: well developed, well nourished, seated, in no evident distress Head: head normocephalic and atraumatic.  Neck: supple with no carotid or supraclavicular bruits Cardiovascular: regular rate and rhythm, no murmurs Musculoskeletal: no deformity Skin:  no rash/petichiae Vascular:  Normal pulses all extremities Vitals:   05/30/20 1048  BP: (!) 169/103  Pulse: 94   Neurologic Exam Mental Status: Awake and fully alert. Oriented to place and time. Recent and remote memory intact. Attention span, concentration and fund of knowledge appropriate. Mood and affect appropriate.  Cranial Nerves: Fundoscopic exam not done pupils equal, briskly reactive to light. Extraocular movements full without nystagmus. Visual fields full to confrontation. Hearing intact. Facial sensation intact. Face, tongue, palate moves normally and symmetrically.  Motor: Normal bulk and tone. Normal strength in all tested extremity muscles except mild weakness of ankle dorsiflexors right greater than left..  Diminished fine finger movements on the left.  Orbits right over left upper extremity. Sensory.: intact to touch ,pinprick sensation but diminished.position and vibratory sensation from ankle down bilaterally.  Positive Romberg sign Coordination: Rapid alternating movements normal in all extremities. Finger-to-nose and heel-to-shin performed accurately bilaterally. Gait and Station: Arises from chair without difficulty. Stance is slightly broad-based.  Uses a wheeled walker with slightly ataxic gait.  Not able to heel, toe and tandem walk without difficulty.  Reflexes: 1+ and symmetric. Toes downgoing.   NIHSS  0 Modified Rankin  2   ASSESSMENT: 87 year old Caucasian male with right subcortical lacunar infarct in May 2020 likely from small vessel disease even though he has  atrial fibrillation and was compliant with his anticoagulation with Eliquis.  Past history of right subcortical lacunar infarct in August 2016.  Multiple vascular risk factors of atrial fibrillation, hypertension, hyperlipidemia, prior stroke.  He also has incidental 6 x 8 mm veretebrobasilar junction aneurysm aneurysm which stable from 2016. Marland Kitchen Recent admission in December 2021 due to the thalamic hemorrhage related to anticoagulation with Eliquis.     PLAN: I had a long discussion with the patient and  his wife regarding his recent thalamic hemorrhage related to anticoagulation with Eliquis and history of atrial fibrillation and ongoing risk for recurrent strokes and embolism.  We discussed treatment options including no anticoagulation versus starting aspirin or going back on Eliquis as well as considering participation in the ASPIRE study to determine whether aspirin versus anticoagulation with Eliquis is superior in preventing recurrent strokes or brain hemorrhages.  He was given written information to review about the study and decide.  Maintain strict control of hypertension with blood pressure goal below 30/90 and lipids with LDL cholesterol goal below 70 mg percent.  He will return for follow-up in the future in 6 months or call earlier if necessary. Greater than 50% of time during this 25 minute visit was spent on counseling,explanation of diagnosis of  Hemorrhagic stroke, atrial fibrillation, neuropathy,, planning of further management, discussion with patient and family and coordination of care Antony Contras, MD  [facility] [address] [place], [place] [address]  Phone [phone] Fax [phone] Note: This document was prepared with digital dictation and possible smart phrase technology. Any transcriptional errors that result from this process are unintentional

## 2020-05-31 ENCOUNTER — Inpatient Hospital Stay: Payer: Medicare Other | Admitting: Physical Medicine & Rehabilitation

## 2020-06-01 DIAGNOSIS — I69011 Memory deficit following nontraumatic subarachnoid hemorrhage: Secondary | ICD-10-CM | POA: Diagnosis not present

## 2020-06-01 DIAGNOSIS — I4891 Unspecified atrial fibrillation: Secondary | ICD-10-CM | POA: Diagnosis not present

## 2020-06-01 DIAGNOSIS — I69398 Other sequelae of cerebral infarction: Secondary | ICD-10-CM | POA: Diagnosis not present

## 2020-06-01 DIAGNOSIS — G6289 Other specified polyneuropathies: Secondary | ICD-10-CM | POA: Diagnosis not present

## 2020-06-01 DIAGNOSIS — I69122 Dysarthria following nontraumatic intracerebral hemorrhage: Secondary | ICD-10-CM | POA: Diagnosis not present

## 2020-06-01 DIAGNOSIS — I69120 Aphasia following nontraumatic intracerebral hemorrhage: Secondary | ICD-10-CM | POA: Diagnosis not present

## 2020-06-05 DIAGNOSIS — I4891 Unspecified atrial fibrillation: Secondary | ICD-10-CM | POA: Diagnosis not present

## 2020-06-05 DIAGNOSIS — I69398 Other sequelae of cerebral infarction: Secondary | ICD-10-CM | POA: Diagnosis not present

## 2020-06-05 DIAGNOSIS — I69120 Aphasia following nontraumatic intracerebral hemorrhage: Secondary | ICD-10-CM | POA: Diagnosis not present

## 2020-06-05 DIAGNOSIS — I69122 Dysarthria following nontraumatic intracerebral hemorrhage: Secondary | ICD-10-CM | POA: Diagnosis not present

## 2020-06-05 DIAGNOSIS — G6289 Other specified polyneuropathies: Secondary | ICD-10-CM | POA: Diagnosis not present

## 2020-06-05 DIAGNOSIS — I69011 Memory deficit following nontraumatic subarachnoid hemorrhage: Secondary | ICD-10-CM | POA: Diagnosis not present

## 2020-06-07 ENCOUNTER — Other Ambulatory Visit: Payer: Self-pay

## 2020-06-07 ENCOUNTER — Encounter: Payer: Self-pay | Admitting: Physical Medicine & Rehabilitation

## 2020-06-07 ENCOUNTER — Encounter: Payer: Medicare Other | Attending: Physical Medicine & Rehabilitation | Admitting: Physical Medicine & Rehabilitation

## 2020-06-07 VITALS — BP 125/85 | HR 83 | Temp 98.2°F | Ht 71.0 in | Wt 218.0 lb

## 2020-06-07 DIAGNOSIS — I69120 Aphasia following nontraumatic intracerebral hemorrhage: Secondary | ICD-10-CM | POA: Diagnosis not present

## 2020-06-07 DIAGNOSIS — G6289 Other specified polyneuropathies: Secondary | ICD-10-CM | POA: Diagnosis not present

## 2020-06-07 DIAGNOSIS — I610 Nontraumatic intracerebral hemorrhage in hemisphere, subcortical: Secondary | ICD-10-CM | POA: Diagnosis not present

## 2020-06-07 DIAGNOSIS — I69398 Other sequelae of cerebral infarction: Secondary | ICD-10-CM | POA: Diagnosis not present

## 2020-06-07 DIAGNOSIS — I69122 Dysarthria following nontraumatic intracerebral hemorrhage: Secondary | ICD-10-CM | POA: Diagnosis not present

## 2020-06-07 DIAGNOSIS — I69011 Memory deficit following nontraumatic subarachnoid hemorrhage: Secondary | ICD-10-CM | POA: Diagnosis not present

## 2020-06-07 DIAGNOSIS — I4891 Unspecified atrial fibrillation: Secondary | ICD-10-CM | POA: Diagnosis not present

## 2020-06-07 NOTE — Progress Notes (Signed)
Subjective:    Patient ID: Steven Meadows, male    DOB: [birth year], 87 y.o.   MRN: [id number] 87 y.o. right-handed male with history of hypertension atrial fibrillation maintained on Eliquis severe neuropathy prior CVA VA aneurysm who was admitted 04/30/2020 with acute onset of confusion and garbled speech.  Per chart review lives with spouse reportedly independent prior to admission and active.  Two-level home bed and bath main level 2 steps to entry.  CT of the head showed a 6 mm hyperdense focus in the left thalamus reflecting small hemorrhage.  CT angiogram head and neck negative for intracranial large vessel occlusion.  Eliquis was reversed with Andexxa.  Neurology felt small thalamic hemorrhage in the setting of coagulopathy due to Eliquis maintained on low-dose Cleviprex for blood pressure management.  Follow-up MRI of the brain revealed progression of small vessel disease in the left thalamus and pons with late subacute left thalamic microhemorrhage and severe chronic small vessel disease.  Admit date: 05/03/2020 Discharge date: 05/15/2020  HPI The patient is seen back in clinic today following discharge from the inpatient rehabilitation stroke program at [facility].  He has been living with his wife and has been independent with dressing and bathing at home.  He has been ambulating independently with a rolling walker, "[place] walker" Fell once in bedroom without walker, no injury except rug burn to the right knee which is healing.   Home health physical therapy continues,HH  OT and SLP discontinued after initial evaluation because the patient was doing well in these areas Still having  some reading comprehension although he is able to understand conversation  Pain Inventory Average Pain 1 Pain Right Now 0 My pain is intermittent and sharp  LOCATION OF PAIN  Pain in both legs   BOWEL Number of stools per week: 5 Oral laxative use No  Type of laxative None Enema or suppository use  No  History of colostomy No  Incontinent No   BLADDER Normal In and out cath, frequency N/a Able to self cath No  Bladder incontinence No  Frequent urination No  Leakage with coughing No  Difficulty starting stream No  Incomplete bladder emptying No    Mobility use a walker how many minutes can you walk? 3 mins with the walker ability to climb steps?  no do you drive?  no Do you have any goals in this area?  yes  Function retired Do you have any goals in this area?  yes  Neuro/Psych weakness numbness trouble walking confusion  Prior Studies Any changes since last visit?  no New Patient  Physicians involved in your care Any changes since last visit?  no New Patient   Family History  Problem Relation Age of Onset  . Heart attack Paternal Grandfather   . Heart attack Maternal Grandfather   . Cancer Paternal Grandmother   . Healthy Mother   . Transient ischemic attack Mother   . Healthy Father   . Healthy Sister    Social History   Socioeconomic History  . Marital status: Married    Spouse name: Benjamine Mola  . Number of children: Not on file  . Years of education: Not on file  . Highest education level: Not on file  Occupational History  . Not on file  Tobacco Use  . Smoking status: Never Smoker  . Smokeless tobacco: Never Used  Vaping Use  . Vaping Use: Never used  Substance and Sexual Activity  . Alcohol use: Yes  Alcohol/week: 0.0 standard drinks    Comment: 2 drinks per night  . Drug use: No  . Sexual activity: Not on file  Other Topics Concern  . Not on file  Social History Narrative   Lives with wife   Right Handed   Drinks 2-3 cups caffeine daily   Social Determinants of Health   Financial Resource Strain: Not on file  Food Insecurity: Not on file  Transportation Needs: Not on file  Physical Activity: Not on file  Stress: Not on file  Social Connections: Not on file   Past Surgical History:  Procedure Laterality Date  .  CATARACT EXTRACTION--right eye  2013  . TONSILLECTOMY AND ADENOIDECTOMY     Past Medical History:  Diagnosis Date  . Abdominal aortic atherosclerosis ([place])   . Actinic keratoses   . 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 Atrial fibrillation ([place])   . Cough   . History of mononucleosis   . HTN (hypertension)   . Hypercholesteremia   . Lacunar stroke ([place])   . Low back pain   . Olecranon bursitis   . Rotator cuff tear   . Stroke ([facility])    BP 125/85   Pulse 83   Temp 98.2 F (36.8 C)   Ht 5\' 11"  (1.803 m)   Wt 218 lb (98.9 kg)   SpO2 97%   BMI 30.40 kg/m   Opioid Risk Score:   Fall Risk Score:  `1  Depression screen PHQ 2/9  Depression screen PHQ 2/9 06/07/2020  Decreased Interest 0  Down, Depressed, Hopeless 0  PHQ - 2 Score 0  Altered sleeping 0  Tired, decreased energy 3  Change in appetite 0  Feeling bad or failure about yourself  0  Trouble concentrating 2  Moving slowly or fidgety/restless 0  Suicidal thoughts 0  PHQ-9 Score 5   Review of Systems  Musculoskeletal: Positive for gait problem. Negative for back pain.  Neurological: Positive for weakness and numbness.  Psychiatric/Behavioral: Positive for confusion.  All other systems reviewed and are negative.      Objective:   Physical Exam Vitals and nursing note reviewed.  Constitutional:      General: He is not in acute distress.    Appearance: He is obese.  HENT:     Head: Normocephalic and atraumatic.     Nose: Nose normal. No congestion.  Eyes:     Extraocular Movements: Extraocular movements intact.     Conjunctiva/sclera: Conjunctivae normal.     Pupils: Pupils are equal, round, and reactive to light.  Cardiovascular:     Rate and Rhythm: Normal rate. Rhythm irregular.     Heart sounds: No murmur heard.   Pulmonary:     Effort: Pulmonary effort is normal. No respiratory distress.     Breath sounds: Normal breath sounds.  Abdominal:     General: Abdomen is flat. Bowel sounds are  normal. There is no distension.     Palpations: Abdomen is soft.     Tenderness: There is no abdominal tenderness.  Musculoskeletal:     Cervical back: Normal range of motion. No tenderness.     Comments: No pain with upper limb or lower limb range of motion.  No pain with ambulation in the lower extremities  Skin:    General: Skin is warm and dry.  Neurological:     Mental Status: He is alert and oriented to person, place, and time.     Cranial Nerves: No dysarthria or facial  asymmetry.     Sensory: No sensory deficit.     Coordination: Coordination normal. Finger-Nose-Finger Test normal. Rapid alternating movements normal.     Gait: Gait abnormal.     Comments: Motor strength is 4/5 bilateral deltoid, bicep, tricep, grip, hip flexor, knee extensors, ankle dorsiflexor and plantar flexor Sensation intact to light touch and pinprick bilateral upper and lower limbs Cerebellar testing no evidence of dysmetria Fine motor testing no issues with finger to thumb opposition  Psychiatric:        Mood and Affect: Mood normal.        Behavior: Behavior normal.        Thought Content: Thought content normal.        Judgment: Judgment normal.           Assessment & Plan:  #1.  Thalamic hemorrhage on the left, was on Eliquis with history of hypertension.  Eliquis has been discontinued while patient hospitalized for his thalamic hemorrhage.  Has been started on 2.5 mg dose of Eliquis by primary care since discharge from the hospital.  The patient has followed up with neurology and there were discussions on pros and cons of restarting Eliquis versus aspirin given recent history.  From a rehab standpoint patient is doing quite well he is functionally independent with his ADLs as well as communication.  He still has problems with gait but has maintain balance with a rolling walker.  He will continue with home health PT.  He really does not have any significant weakness, issues are mainly with balance.   I do not think he will need outpatient physical therapy after home health concludes.  If patient feels like he is having a decline in function after home health PT stops, he can call the office and we can order this.  Physical medicine rehab follow-up on as needed basis, would need to see him back if we refer to outpatient PT.

## 2020-06-07 NOTE — Patient Instructions (Signed)
Stroke Prevention Some medical conditions and lifestyle choices can lead to a higher risk for a stroke. You can help to prevent a stroke by eating healthy foods and exercising. It also helps to not smoke and to manage any health problems you may have. How can this condition affect me? A stroke is an emergency. It should be treated right away. A stroke can lead to brain damage or threaten your life. There is a better chance of surviving and getting better after a stroke if you get medical help right away. What can increase my risk? The following medical conditions may increase your risk of a stroke:  Diseases of the heart and blood vessels (cardiovascular disease).  High blood pressure (hypertension).  Diabetes.  High cholesterol.  Sickle cell disease.  Problems with blood clotting.  Being very overweight.  Sleeping problems (obstructivesleep apnea). Other risk factors include:  Being older than age 60.  A history of blood clots, stroke, or mini-stroke (TIA).  Race, ethnic background, or a family history of stroke.  Smoking or using tobacco products.  Taking birth control pills, especially if you smoke.  Heavy alcohol and drug use.  Not being active. What actions can I take to prevent this? Manage your health conditions  High cholesterol. ? Eat a healthy diet. If this is not enough to manage your cholesterol, you may need to take medicines. ? Take medicines as told by your doctor.  High blood pressure. ? Try to keep your blood pressure below 130/80. ? If your blood pressure cannot be managed through a healthy diet and regular exercise, you may need to take medicines. ? Take medicines as told by your doctor. ? Ask your doctor if you should check your blood pressure at home. ? Have your blood pressure checked every year.  Diabetes. ? Eat a healthy diet and get regular exercise. If your blood sugar (glucose) cannot be managed through diet and exercise, you may need to  take medicines. ? Take medicines as told by your doctor.  Talk to your doctor about getting checked for sleeping problems. Signs of a problem can include: ? Snoring a lot. ? Feeling very tired.  Make sure that you manage any other conditions you have. Nutrition  Follow instructions from your doctor about what to eat or drink. You may be told to: ? Eat and drink fewer calories each day. ? Limit how much salt (sodium) you use to 1,500 milligrams (mg) each day. ? Use only healthy fats for cooking, such as olive oil, canola oil, and sunflower oil. ? Eat healthy foods. To do this:  Choose foods that are high in fiber. These include whole grains, and fresh fruits and vegetables.  Eat at least 5 servings of fruits and vegetables a day. Try to fill one-half of your plate with fruits and vegetables at each meal.  Choose low-fat (lean) proteins. These include low-fat cuts of meat, chicken without skin, fish, tofu, beans, and nuts.  Eat low-fat dairy products. ? Avoid foods that:  Are high in salt.  Have saturated fat.  Have trans fat.  Have cholesterol.  Are processed or pre-made. ? Count how many carbohydrates you eat and drink each day.   Lifestyle  If you drink alcohol: ? Limit how much you have to:  0-1 drink a day for women who are not pregnant.  0-2 drinks a day for men. ? Know how much alcohol is in your drink. In the [place] one drink equals one 12 oz bottle   of beer (355mL), one 5 oz glass of wine (148mL), or one 1 oz glass of hard liquor (44mL).  Do not smoke or use any products that have nicotine or tobacco. If you need help quitting, ask your doctor.  Avoid secondhand smoke.  Do not use drugs. Activity  Try to stay at a healthy weight.  Get at least 30 minutes of exercise on most days, such as: ? Fast walking. ? Biking. ? Swimming.   Medicines  Take over-the-counter and prescription medicines only as told by your doctor.  Avoid taking birth control pills.  Talk to your doctor about the risks of taking birth control pills if: ? You are over 35 years old. ? You smoke. ? You get very bad headaches. ? You have had a blood clot. Where to find more information  American Stroke Association: www.strokeassociation.org Get help right away if:  You or a loved one has any signs of a stroke. "BE FAST" is an easy way to remember the warning signs: ? B - Balance. Dizziness, sudden trouble walking, or loss of balance. ? E - Eyes. Trouble seeing or a change in how you see. ? F - Face. Sudden weakness or loss of feeling of the face. The face or eyelid may droop on one side. ? A - Arms. Weakness or loss of feeling in an arm. This happens all of a sudden and most often on one side of the body. ? S - Speech. Sudden trouble speaking, slurred speech, or trouble understanding what people say. ? T - Time. Time to call emergency services. Write down what time symptoms started.  You or a loved one has other signs of a stroke, such as: ? A sudden, very bad headache with no known cause. ? Feeling like you may vomit (nausea). ? Vomiting. ? A seizure. These symptoms may be an emergency. Get help right away. Call your local emergency services (911 in the [place]  Do not wait to see if the symptoms will go away.  Do not drive yourself to the hospital. Summary  You can help to prevent a stroke by eating healthy, exercising, and not smoking. It also helps to manage any health problems you have.  Do not smoke or use any products that contain nicotine or tobacco.  Get help right away if you or a loved one has any signs of a stroke. This information is not intended to replace advice given to you by your health care provider. Make sure you discuss any questions you have with your health care provider. Document Revised: 11/21/2019 Document Reviewed: 11/21/2019 Elsevier Patient Education  2021 Elsevier Inc.  

## 2020-06-13 DIAGNOSIS — I69398 Other sequelae of cerebral infarction: Secondary | ICD-10-CM | POA: Diagnosis not present

## 2020-06-13 DIAGNOSIS — I69122 Dysarthria following nontraumatic intracerebral hemorrhage: Secondary | ICD-10-CM | POA: Diagnosis not present

## 2020-06-13 DIAGNOSIS — I69120 Aphasia following nontraumatic intracerebral hemorrhage: Secondary | ICD-10-CM | POA: Diagnosis not present

## 2020-06-13 DIAGNOSIS — G6289 Other specified polyneuropathies: Secondary | ICD-10-CM | POA: Diagnosis not present

## 2020-06-13 DIAGNOSIS — I69011 Memory deficit following nontraumatic subarachnoid hemorrhage: Secondary | ICD-10-CM | POA: Diagnosis not present

## 2020-06-13 DIAGNOSIS — I4891 Unspecified atrial fibrillation: Secondary | ICD-10-CM | POA: Diagnosis not present

## 2020-06-15 DIAGNOSIS — M545 Low back pain, unspecified: Secondary | ICD-10-CM | POA: Diagnosis not present

## 2020-06-15 DIAGNOSIS — I69011 Memory deficit following nontraumatic subarachnoid hemorrhage: Secondary | ICD-10-CM | POA: Diagnosis not present

## 2020-06-15 DIAGNOSIS — I726 Aneurysm of vertebral artery: Secondary | ICD-10-CM | POA: Diagnosis not present

## 2020-06-15 DIAGNOSIS — I69398 Other sequelae of cerebral infarction: Secondary | ICD-10-CM | POA: Diagnosis not present

## 2020-06-15 DIAGNOSIS — G6289 Other specified polyneuropathies: Secondary | ICD-10-CM | POA: Diagnosis not present

## 2020-06-15 DIAGNOSIS — I69122 Dysarthria following nontraumatic intracerebral hemorrhage: Secondary | ICD-10-CM | POA: Diagnosis not present

## 2020-06-15 DIAGNOSIS — Z9181 History of falling: Secondary | ICD-10-CM | POA: Diagnosis not present

## 2020-06-15 DIAGNOSIS — I69120 Aphasia following nontraumatic intracerebral hemorrhage: Secondary | ICD-10-CM | POA: Diagnosis not present

## 2020-06-15 DIAGNOSIS — Z8673 Personal history of transient ischemic attack (TIA), and cerebral infarction without residual deficits: Secondary | ICD-10-CM | POA: Diagnosis not present

## 2020-06-15 DIAGNOSIS — I4891 Unspecified atrial fibrillation: Secondary | ICD-10-CM | POA: Diagnosis not present

## 2020-06-15 DIAGNOSIS — I10 Essential (primary) hypertension: Secondary | ICD-10-CM | POA: Diagnosis not present

## 2020-06-18 DIAGNOSIS — I4891 Unspecified atrial fibrillation: Secondary | ICD-10-CM | POA: Diagnosis not present

## 2020-06-18 DIAGNOSIS — I69011 Memory deficit following nontraumatic subarachnoid hemorrhage: Secondary | ICD-10-CM | POA: Diagnosis not present

## 2020-06-18 DIAGNOSIS — I69120 Aphasia following nontraumatic intracerebral hemorrhage: Secondary | ICD-10-CM | POA: Diagnosis not present

## 2020-06-18 DIAGNOSIS — I69122 Dysarthria following nontraumatic intracerebral hemorrhage: Secondary | ICD-10-CM | POA: Diagnosis not present

## 2020-06-18 DIAGNOSIS — G6289 Other specified polyneuropathies: Secondary | ICD-10-CM | POA: Diagnosis not present

## 2020-06-18 DIAGNOSIS — I69398 Other sequelae of cerebral infarction: Secondary | ICD-10-CM | POA: Diagnosis not present

## 2020-06-19 DIAGNOSIS — Z85828 Personal history of other malignant neoplasm of skin: Secondary | ICD-10-CM | POA: Diagnosis not present

## 2020-06-19 DIAGNOSIS — L812 Freckles: Secondary | ICD-10-CM | POA: Diagnosis not present

## 2020-06-19 DIAGNOSIS — L821 Other seborrheic keratosis: Secondary | ICD-10-CM | POA: Diagnosis not present

## 2020-06-19 DIAGNOSIS — L853 Xerosis cutis: Secondary | ICD-10-CM | POA: Diagnosis not present

## 2020-06-19 DIAGNOSIS — L570 Actinic keratosis: Secondary | ICD-10-CM | POA: Diagnosis not present

## 2020-06-20 DIAGNOSIS — G6289 Other specified polyneuropathies: Secondary | ICD-10-CM | POA: Diagnosis not present

## 2020-06-20 DIAGNOSIS — I4891 Unspecified atrial fibrillation: Secondary | ICD-10-CM | POA: Diagnosis not present

## 2020-06-20 DIAGNOSIS — I69122 Dysarthria following nontraumatic intracerebral hemorrhage: Secondary | ICD-10-CM | POA: Diagnosis not present

## 2020-06-20 DIAGNOSIS — I69398 Other sequelae of cerebral infarction: Secondary | ICD-10-CM | POA: Diagnosis not present

## 2020-06-20 DIAGNOSIS — I69120 Aphasia following nontraumatic intracerebral hemorrhage: Secondary | ICD-10-CM | POA: Diagnosis not present

## 2020-06-20 DIAGNOSIS — I69011 Memory deficit following nontraumatic subarachnoid hemorrhage: Secondary | ICD-10-CM | POA: Diagnosis not present

## 2020-06-26 ENCOUNTER — Inpatient Hospital Stay: Payer: Medicare Other | Admitting: Neurology

## 2020-06-27 DIAGNOSIS — I69398 Other sequelae of cerebral infarction: Secondary | ICD-10-CM | POA: Diagnosis not present

## 2020-06-27 DIAGNOSIS — I4891 Unspecified atrial fibrillation: Secondary | ICD-10-CM | POA: Diagnosis not present

## 2020-06-27 DIAGNOSIS — I69122 Dysarthria following nontraumatic intracerebral hemorrhage: Secondary | ICD-10-CM | POA: Diagnosis not present

## 2020-06-27 DIAGNOSIS — I69011 Memory deficit following nontraumatic subarachnoid hemorrhage: Secondary | ICD-10-CM | POA: Diagnosis not present

## 2020-06-27 DIAGNOSIS — G6289 Other specified polyneuropathies: Secondary | ICD-10-CM | POA: Diagnosis not present

## 2020-06-27 DIAGNOSIS — I69120 Aphasia following nontraumatic intracerebral hemorrhage: Secondary | ICD-10-CM | POA: Diagnosis not present

## 2020-07-17 DIAGNOSIS — R4189 Other symptoms and signs involving cognitive functions and awareness: Secondary | ICD-10-CM | POA: Diagnosis not present

## 2020-07-17 DIAGNOSIS — I10 Essential (primary) hypertension: Secondary | ICD-10-CM | POA: Diagnosis not present

## 2020-07-17 DIAGNOSIS — I619 Nontraumatic intracerebral hemorrhage, unspecified: Secondary | ICD-10-CM | POA: Diagnosis not present

## 2020-07-17 DIAGNOSIS — D6869 Other thrombophilia: Secondary | ICD-10-CM | POA: Diagnosis not present

## 2020-07-17 DIAGNOSIS — I679 Cerebrovascular disease, unspecified: Secondary | ICD-10-CM | POA: Diagnosis not present

## 2020-07-17 DIAGNOSIS — I4891 Unspecified atrial fibrillation: Secondary | ICD-10-CM | POA: Diagnosis not present

## 2020-09-03 DIAGNOSIS — Z79899 Other long term (current) drug therapy: Secondary | ICD-10-CM | POA: Diagnosis not present

## 2020-09-03 DIAGNOSIS — Z8673 Personal history of transient ischemic attack (TIA), and cerebral infarction without residual deficits: Secondary | ICD-10-CM | POA: Diagnosis not present

## 2020-09-03 DIAGNOSIS — R531 Weakness: Secondary | ICD-10-CM | POA: Diagnosis not present

## 2020-09-03 DIAGNOSIS — R059 Cough, unspecified: Secondary | ICD-10-CM | POA: Diagnosis not present

## 2020-09-03 DIAGNOSIS — G4710 Hypersomnia, unspecified: Secondary | ICD-10-CM | POA: Diagnosis not present

## 2020-10-01 DIAGNOSIS — E7800 Pure hypercholesterolemia, unspecified: Secondary | ICD-10-CM | POA: Diagnosis not present

## 2020-10-01 DIAGNOSIS — K219 Gastro-esophageal reflux disease without esophagitis: Secondary | ICD-10-CM | POA: Diagnosis not present

## 2020-10-01 DIAGNOSIS — I10 Essential (primary) hypertension: Secondary | ICD-10-CM | POA: Diagnosis not present

## 2020-10-01 DIAGNOSIS — I4891 Unspecified atrial fibrillation: Secondary | ICD-10-CM | POA: Diagnosis not present

## 2020-10-01 DIAGNOSIS — G459 Transient cerebral ischemic attack, unspecified: Secondary | ICD-10-CM | POA: Diagnosis not present

## 2020-10-01 DIAGNOSIS — I639 Cerebral infarction, unspecified: Secondary | ICD-10-CM | POA: Diagnosis not present

## 2020-10-01 DIAGNOSIS — M179 Osteoarthritis of knee, unspecified: Secondary | ICD-10-CM | POA: Diagnosis not present

## 2020-10-01 DIAGNOSIS — I619 Nontraumatic intracerebral hemorrhage, unspecified: Secondary | ICD-10-CM | POA: Diagnosis not present

## 2020-10-30 DIAGNOSIS — K219 Gastro-esophageal reflux disease without esophagitis: Secondary | ICD-10-CM | POA: Diagnosis not present

## 2020-10-30 DIAGNOSIS — I10 Essential (primary) hypertension: Secondary | ICD-10-CM | POA: Diagnosis not present

## 2020-10-30 DIAGNOSIS — E7800 Pure hypercholesterolemia, unspecified: Secondary | ICD-10-CM | POA: Diagnosis not present

## 2020-10-30 DIAGNOSIS — G629 Polyneuropathy, unspecified: Secondary | ICD-10-CM | POA: Diagnosis not present

## 2020-10-30 DIAGNOSIS — G4731 Primary central sleep apnea: Secondary | ICD-10-CM | POA: Diagnosis not present

## 2020-10-30 DIAGNOSIS — R269 Unspecified abnormalities of gait and mobility: Secondary | ICD-10-CM | POA: Diagnosis not present

## 2020-10-30 DIAGNOSIS — D6869 Other thrombophilia: Secondary | ICD-10-CM | POA: Diagnosis not present

## 2020-10-30 DIAGNOSIS — I4891 Unspecified atrial fibrillation: Secondary | ICD-10-CM | POA: Diagnosis not present

## 2020-10-30 DIAGNOSIS — I619 Nontraumatic intracerebral hemorrhage, unspecified: Secondary | ICD-10-CM | POA: Diagnosis not present

## 2020-11-09 DIAGNOSIS — G459 Transient cerebral ischemic attack, unspecified: Secondary | ICD-10-CM | POA: Diagnosis not present

## 2020-11-09 DIAGNOSIS — M179 Osteoarthritis of knee, unspecified: Secondary | ICD-10-CM | POA: Diagnosis not present

## 2020-11-09 DIAGNOSIS — K219 Gastro-esophageal reflux disease without esophagitis: Secondary | ICD-10-CM | POA: Diagnosis not present

## 2020-11-09 DIAGNOSIS — I639 Cerebral infarction, unspecified: Secondary | ICD-10-CM | POA: Diagnosis not present

## 2020-11-09 DIAGNOSIS — I10 Essential (primary) hypertension: Secondary | ICD-10-CM | POA: Diagnosis not present

## 2020-11-09 DIAGNOSIS — E7800 Pure hypercholesterolemia, unspecified: Secondary | ICD-10-CM | POA: Diagnosis not present

## 2020-11-09 DIAGNOSIS — I4891 Unspecified atrial fibrillation: Secondary | ICD-10-CM | POA: Diagnosis not present

## 2020-11-09 DIAGNOSIS — I619 Nontraumatic intracerebral hemorrhage, unspecified: Secondary | ICD-10-CM | POA: Diagnosis not present

## 2020-11-29 ENCOUNTER — Encounter: Payer: Self-pay | Admitting: Neurology

## 2020-11-29 ENCOUNTER — Ambulatory Visit: Payer: Medicare Other | Admitting: Neurology

## 2020-12-05 DIAGNOSIS — K219 Gastro-esophageal reflux disease without esophagitis: Secondary | ICD-10-CM | POA: Diagnosis not present

## 2020-12-05 DIAGNOSIS — I619 Nontraumatic intracerebral hemorrhage, unspecified: Secondary | ICD-10-CM | POA: Diagnosis not present

## 2020-12-05 DIAGNOSIS — M179 Osteoarthritis of knee, unspecified: Secondary | ICD-10-CM | POA: Diagnosis not present

## 2020-12-05 DIAGNOSIS — G459 Transient cerebral ischemic attack, unspecified: Secondary | ICD-10-CM | POA: Diagnosis not present

## 2020-12-05 DIAGNOSIS — E7800 Pure hypercholesterolemia, unspecified: Secondary | ICD-10-CM | POA: Diagnosis not present

## 2020-12-05 DIAGNOSIS — I10 Essential (primary) hypertension: Secondary | ICD-10-CM | POA: Diagnosis not present

## 2020-12-05 DIAGNOSIS — I639 Cerebral infarction, unspecified: Secondary | ICD-10-CM | POA: Diagnosis not present

## 2020-12-05 DIAGNOSIS — I4891 Unspecified atrial fibrillation: Secondary | ICD-10-CM | POA: Diagnosis not present

## 2020-12-07 ENCOUNTER — Emergency Department (HOSPITAL_COMMUNITY): Payer: Medicare Other

## 2020-12-07 ENCOUNTER — Observation Stay (HOSPITAL_COMMUNITY): Payer: Medicare Other

## 2020-12-07 ENCOUNTER — Observation Stay (HOSPITAL_COMMUNITY)
Admission: EM | Admit: 2020-12-07 | Discharge: 2020-12-09 | Disposition: A | Discharge status: Home / Self Care | Payer: Medicare Other | Attending: Internal Medicine | Admitting: Internal Medicine

## 2020-12-07 ENCOUNTER — Other Ambulatory Visit: Payer: Self-pay

## 2020-12-07 ENCOUNTER — Encounter (HOSPITAL_COMMUNITY): Payer: Self-pay

## 2020-12-07 DIAGNOSIS — I10 Essential (primary) hypertension: Secondary | ICD-10-CM

## 2020-12-07 DIAGNOSIS — I4891 Unspecified atrial fibrillation: Secondary | ICD-10-CM | POA: Diagnosis present

## 2020-12-07 DIAGNOSIS — E785 Hyperlipidemia, unspecified: Secondary | ICD-10-CM

## 2020-12-07 DIAGNOSIS — R29810 Facial weakness: Secondary | ICD-10-CM | POA: Diagnosis not present

## 2020-12-07 DIAGNOSIS — Z20822 Contact with and (suspected) exposure to covid-19: Secondary | ICD-10-CM | POA: Insufficient documentation

## 2020-12-07 DIAGNOSIS — I4811 Longstanding persistent atrial fibrillation: Secondary | ICD-10-CM | POA: Diagnosis not present

## 2020-12-07 DIAGNOSIS — I672 Cerebral atherosclerosis: Secondary | ICD-10-CM | POA: Diagnosis not present

## 2020-12-07 DIAGNOSIS — I63431 Cerebral infarction due to embolism of right posterior cerebral artery: Secondary | ICD-10-CM | POA: Diagnosis not present

## 2020-12-07 DIAGNOSIS — R531 Weakness: Secondary | ICD-10-CM | POA: Diagnosis not present

## 2020-12-07 DIAGNOSIS — I639 Cerebral infarction, unspecified: Secondary | ICD-10-CM

## 2020-12-07 DIAGNOSIS — G459 Transient cerebral ischemic attack, unspecified: Secondary | ICD-10-CM | POA: Diagnosis present

## 2020-12-07 DIAGNOSIS — Y900 Blood alcohol level of less than 20 mg/100 ml: Secondary | ICD-10-CM | POA: Diagnosis not present

## 2020-12-07 DIAGNOSIS — Z8673 Personal history of transient ischemic attack (TIA), and cerebral infarction without residual deficits: Secondary | ICD-10-CM | POA: Diagnosis not present

## 2020-12-07 DIAGNOSIS — R4701 Aphasia: Secondary | ICD-10-CM | POA: Diagnosis not present

## 2020-12-07 DIAGNOSIS — Z7901 Long term (current) use of anticoagulants: Secondary | ICD-10-CM | POA: Diagnosis not present

## 2020-12-07 DIAGNOSIS — R4781 Slurred speech: Secondary | ICD-10-CM | POA: Diagnosis not present

## 2020-12-07 DIAGNOSIS — I482 Chronic atrial fibrillation, unspecified: Secondary | ICD-10-CM | POA: Diagnosis not present

## 2020-12-07 DIAGNOSIS — Z79899 Other long term (current) drug therapy: Secondary | ICD-10-CM | POA: Insufficient documentation

## 2020-12-07 DIAGNOSIS — I610 Nontraumatic intracerebral hemorrhage in hemisphere, subcortical: Secondary | ICD-10-CM | POA: Diagnosis present

## 2020-12-07 DIAGNOSIS — I728 Aneurysm of other specified arteries: Secondary | ICD-10-CM | POA: Diagnosis not present

## 2020-12-07 LAB — URINALYSIS, ROUTINE W REFLEX MICROSCOPIC
Bilirubin Urine: NEGATIVE
Glucose, UA: NEGATIVE mg/dL
Hgb urine dipstick: NEGATIVE
Ketones, ur: NEGATIVE mg/dL
Leukocytes,Ua: NEGATIVE
Nitrite: NEGATIVE
Protein, ur: NEGATIVE mg/dL
Specific Gravity, Urine: 1.005 (ref 1.005–1.030)
pH: 7 (ref 5.0–8.0)

## 2020-12-07 LAB — APTT: aPTT: 33 seconds (ref 24–36)

## 2020-12-07 LAB — COMPREHENSIVE METABOLIC PANEL
ALT: 24 U/L (ref 0–44)
AST: 21 U/L (ref 15–41)
Albumin: 3.9 g/dL (ref 3.5–5.0)
Alkaline Phosphatase: 25 U/L — ABNORMAL LOW (ref 38–126)
Anion gap: 10 (ref 5–15)
BUN: 9 mg/dL (ref 8–23)
CO2: 22 mmol/L (ref 22–32)
Calcium: 9.3 mg/dL (ref 8.9–10.3)
Chloride: 105 mmol/L (ref 98–111)
Creatinine, Ser: 0.93 mg/dL (ref 0.61–1.24)
GFR, Estimated: >60 mL/min (ref >60)
Glucose, Bld: 104 mg/dL — ABNORMAL HIGH (ref 70–99)
Potassium: 3.7 mmol/L (ref 3.5–5.1)
Sodium: 137 mmol/L (ref 135–145)
Total Bilirubin: 1.6 mg/dL — ABNORMAL HIGH (ref 0.3–1.2)
Total Protein: 6.5 g/dL (ref 6.5–8.1)

## 2020-12-07 LAB — RESP PANEL BY RT-PCR (FLU A&B, COVID) ARPGX2
Influenza A by PCR: NEGATIVE
Influenza B by PCR: NEGATIVE
SARS Coronavirus 2 by RT PCR: NEGATIVE

## 2020-12-07 LAB — I-STAT CHEM 8, ED
BUN: 11 mg/dL (ref 8–23)
Calcium, Ion: 1.12 mmol/L — ABNORMAL LOW (ref 1.15–1.40)
Chloride: 104 mmol/L (ref 98–111)
Creatinine, Ser: 0.8 mg/dL (ref 0.61–1.24)
Glucose, Bld: 104 mg/dL — ABNORMAL HIGH (ref 70–99)
HCT: 47 % (ref 39.0–52.0)
Hemoglobin: 16 g/dL (ref 13.0–17.0)
Potassium: 3.8 mmol/L (ref 3.5–5.1)
Sodium: 138 mmol/L (ref 135–145)
TCO2: 24 mmol/L (ref 22–32)

## 2020-12-07 LAB — RAPID URINE DRUG SCREEN, HOSP PERFORMED
Amphetamines: NOT DETECTED
Barbiturates: NOT DETECTED
Benzodiazepines: NOT DETECTED
Cocaine: NOT DETECTED
Opiates: NOT DETECTED
Tetrahydrocannabinol: NOT DETECTED

## 2020-12-07 LAB — PROTIME-INR
INR: 1 (ref 0.8–1.2)
Prothrombin Time: 13 seconds (ref 11.4–15.2)

## 2020-12-07 LAB — CBC
HCT: 48.3 % (ref 39.0–52.0)
Hemoglobin: 15.9 g/dL (ref 13.0–17.0)
MCH: 28.6 pg (ref 26.0–34.0)
MCHC: 32.9 g/dL (ref 30.0–36.0)
MCV: 87 fL (ref 80.0–100.0)
Platelets: 178 10*3/uL (ref 150–400)
RBC: 5.55 MIL/uL (ref 4.22–5.81)
RDW: 14.1 % (ref 11.5–15.5)
WBC: 4.4 10*3/uL (ref 4.0–10.5)
nRBC: 0 % (ref 0.0–0.2)

## 2020-12-07 LAB — DIFFERENTIAL
Abs Immature Granulocytes: 0.02 10*3/uL (ref 0.00–0.07)
Basophils Absolute: 0 10*3/uL (ref 0.0–0.1)
Basophils Relative: 1 %
Eosinophils Absolute: 0.1 10*3/uL (ref 0.0–0.5)
Eosinophils Relative: 2 %
Immature Granulocytes: 1 %
Lymphocytes Relative: 43 %
Lymphs Abs: 1.9 10*3/uL (ref 0.7–4.0)
Monocytes Absolute: 0.7 10*3/uL (ref 0.1–1.0)
Monocytes Relative: 16 %
Neutro Abs: 1.6 10*3/uL — ABNORMAL LOW (ref 1.7–7.7)
Neutrophils Relative %: 37 %

## 2020-12-07 LAB — ETHANOL: Alcohol, Ethyl (B): <10 mg/dL (ref <10)

## 2020-12-07 MED ORDER — APIXABAN 2.5 MG PO TABS
2.5000 mg | ORAL_TABLET | Freq: Two times a day (BID) | ORAL | Status: DC
  Administered 2020-12-07 – 2020-12-08 (×2): 2.5 mg via ORAL
  Filled 2020-12-07 (×2): qty 1

## 2020-12-07 MED ORDER — POLYETHYLENE GLYCOL 3350 17 G PO PACK
17.0000 g | PACK | Freq: Two times a day (BID) | ORAL | Status: DC
  Filled 2020-12-07 (×2): qty 1

## 2020-12-07 MED ORDER — ACETAMINOPHEN 325 MG PO TABS
650.0000 mg | ORAL_TABLET | Freq: Four times a day (QID) | ORAL | Status: DC | PRN
Start: 2020-12-07 — End: 2020-12-07

## 2020-12-07 MED ORDER — SENNOSIDES-DOCUSATE SODIUM 8.6-50 MG PO TABS
1.0000 | ORAL_TABLET | Freq: Two times a day (BID) | ORAL | Status: DC
  Filled 2020-12-07 (×3): qty 1

## 2020-12-07 MED ORDER — ACETAMINOPHEN 325 MG PO TABS: 650.0000 mg | ORAL_TABLET | ORAL | Status: DC | PRN

## 2020-12-07 MED ORDER — STROKE: EARLY STAGES OF RECOVERY BOOK
Freq: Once | Status: DC
  Filled 2020-12-07: qty 1

## 2020-12-07 MED ORDER — ACETAMINOPHEN 160 MG/5ML PO SOLN: 650.0000 mg | ORAL | Status: DC | PRN

## 2020-12-07 MED ORDER — EZETIMIBE 10 MG PO TABS
10.0000 mg | ORAL_TABLET | Freq: Every day | ORAL | Status: DC
  Administered 2020-12-08: 10 mg via ORAL
  Filled 2020-12-07: qty 1

## 2020-12-07 MED ORDER — ROSUVASTATIN CALCIUM 5 MG PO TABS
10.0000 mg | ORAL_TABLET | Freq: Every day | ORAL | Status: DC
  Administered 2020-12-07: 10 mg via ORAL
  Filled 2020-12-07: qty 2

## 2020-12-07 MED ORDER — DILTIAZEM HCL ER COATED BEADS 120 MG PO CP24
240.0000 mg | ORAL_CAPSULE | Freq: Every day | ORAL | Status: DC
  Administered 2020-12-08: 240 mg via ORAL
  Filled 2020-12-07: qty 1
  Filled 2020-12-07: qty 2

## 2020-12-07 MED ORDER — PANTOPRAZOLE SODIUM 40 MG PO TBEC
40.0000 mg | DELAYED_RELEASE_TABLET | Freq: Every day | ORAL | Status: DC
  Administered 2020-12-08: 40 mg via ORAL
  Filled 2020-12-07: qty 1

## 2020-12-07 MED ORDER — ACETAMINOPHEN 650 MG RE SUPP: 650.0000 mg | RECTAL | Status: DC | PRN

## 2020-12-07 MED ORDER — SENNOSIDES-DOCUSATE SODIUM 8.6-50 MG PO TABS: 1.0000 | ORAL_TABLET | Freq: Every evening | ORAL | Status: DC | PRN

## 2020-12-07 NOTE — ED Triage Notes (Signed)
Pt from home with ems for slurred speech when pt woke up today around noon. LSN 1045 last night. EMS reports slurred speech has been intermittent and has resolved at the time of arrival to ED. Pt has no other neuro deficits at this time. HX of stroke and a fib (takes eliquis). Pt oriented x3, disoriented to the month.

## 2020-12-07 NOTE — H&P (Signed)
History and Physical    JAVARES CARLISE [id number] DOB: 03-25-1932 DOA: 12/07/2020  PCP: Josetta Huddle, MD Patient coming from: Home  Chief Complaint: Slurred speech  HPI: Steven Meadows is a 87 y.o. male with medical history significant of ischemic and hemorrhagic CVA on low-dose Eliquis, atrial fibrillation, HTN, HLD, rotator cuff tear, arthritis comes to the hospital for evaluation of slurred speech.  Patient states around 11:45 AM this morning he started having slurred speech, difficulty communicating with his wife.  His wife asked him to stick his tongue out which was deviated to 1 side, she asked him to sit down and call 911.  By the time EMS arrived his symptoms were pretty much resolved but he was brought to the hospital for further evaluation.  Patient admits that he has been compliant with his medications.  Denies any other symptoms.  Patient has history of ischemic CVA in May 2020 and then small hemorrhagic CVA in December 2021.  Initial CVA associated due to A. fib and hemorrhagic CVA likely from Eliquis.  After outpatient discussion he was placed on low-dose Eliquis 2.5 mg twice daily.  In the ED today is CT of the head is negative for any acute CVA, shows volume loss and old infarct.  Routine blood work is unremarkable.  Case discussed by ER provider with neurology recommending MRI brain and admitting the patient. When I saw the patient his symptoms are resolved and he felt back to baseline.  Wife was present at bedside.  Social history-drinks 1 drink every evening, denies any tobacco and illicit drug use CODE STATUS-full   Review of Systems: As per HPI otherwise 10 point review of systems negative.  Review of Systems Otherwise negative except as per HPI, including: General: Denies fever, chills, night sweats or unintended weight loss. Resp: Denies cough, wheezing, shortness of breath. Cardiac: Denies chest pain, palpitations, orthopnea, paroxysmal nocturnal dyspnea. GI:  Denies abdominal pain, nausea, vomiting, diarrhea or constipation GU: Denies dysuria, frequency, hesitancy or incontinence MS: Denies muscle aches, joint pain or swelling Neuro: Denies headache Psych: Denies anxiety, depression, SI/HI/AVH Skin: Denies new rashes or lesions ID: Denies sick contacts, exotic exposures, travel  Past Medical History:  Diagnosis Date   Abdominal aortic atherosclerosis ([place])    Actinic keratoses    Aneurysm of vertebral artery (HCC)    Atrial fibrillation (HCC)    Cough    History of mononucleosis    HTN (hypertension)    Hypercholesteremia    Lacunar stroke ([place])    Low back pain    Olecranon bursitis    Rotator cuff tear    Stroke [facility])     Past Surgical History:  Procedure Laterality Date   CATARACT EXTRACTION--right eye  2013   TONSILLECTOMY AND ADENOIDECTOMY      SOCIAL HISTORY:  reports that he has never smoked. He has never used smokeless tobacco. He reports current alcohol use. He reports that he does not use drugs.  Allergies  Allergen Reactions   Nabumetone Other (See Comments)    Gi upset   Ace Inhibitors Cough   Flonase [Fluticasone Propionate] Other (See Comments)    Causes nosebleeds    FAMILY HISTORY: Family History  Problem Relation Age of Onset   Heart attack Paternal Grandfather    Heart attack Maternal Grandfather    Cancer Paternal Grandmother    Healthy Mother    Transient ischemic attack Mother    Healthy Father    Healthy Sister  Prior to Admission medications   Medication Sig Start Date End Date Taking? Authorizing Provider  apixaban (ELIQUIS) 2.5 MG TABS tablet Take 2.5 mg by mouth 2 (two) times daily.   Yes [provider]  acetaminophen (TYLENOL) 325 MG tablet Take 2 tablets (650 mg total) by mouth every 4 (four) hours as needed for mild pain (or temp > 37.5 C (99.5 F)). 05/14/20   Angiulli, Lavon Paganini, PA-C  chlorhexidine (PERIDEX) 0.12 % solution Use as directed 15 mLs in the mouth or throat  See admin instructions. Rinse and spit 15 mls twice daily after meals    [provider]  diltiazem (CARDIZEM CD) 240 MG 24 hr capsule Take 1 capsule (240 mg total) by mouth daily. 05/14/20   Angiulli, Lavon Paganini, PA-C  ezetimibe (ZETIA) 10 MG tablet Take 1 tablet (10 mg total) by mouth daily. 05/14/20   Angiulli, Lavon Paganini, PA-C  hydrALAZINE (APRESOLINE) 25 MG tablet Take 1 tablet (25 mg total) by mouth every 8 (eight) hours. 05/14/20   Angiulli, Lavon Paganini, PA-C  hydrocortisone 2.5 % cream Apply 1 application topically 2 (two) times daily as needed (rash).    [provider]  loratadine (CLARITIN) 10 MG tablet 1 tablet    [provider]  omeprazole (PRILOSEC) 40 MG capsule Take 40 mg by mouth daily.    [provider]  pantoprazole (PROTONIX) 40 MG tablet Take 40 mg by mouth daily.    [provider]  polyethylene glycol (MIRALAX / GLYCOLAX) 17 g packet Take 17 g by mouth 2 (two) times daily. Patient not taking: Reported on 06/07/2020 05/14/20   Angiulli, Lavon Paganini, PA-C  potassium chloride (KLOR-CON) 10 MEQ tablet Take 1 tablet (10 mEq total) by mouth daily. 05/14/20   Angiulli, Lavon Paganini, PA-C  rosuvastatin (CRESTOR) 10 MG tablet Take 10 mg by mouth daily.    [provider]  senna-docusate (SENOKOT-S) 8.6-50 MG tablet Take 1 tablet by mouth 2 (two) times daily. 05/03/20   Vonzella Nipple, NP  triamterene-hydrochlorothiazide (MAXZIDE-25) 37.5-25 MG tablet Take 1 tablet by mouth daily. 05/14/20   Cathlyn Parsons, PA-C    Physical Exam: Vitals:   12/07/20 1600 12/07/20 1615 12/07/20 1630 12/07/20 1730  BP: (!) 160/99 (!) 156/111 (!) 165/124 (!) 162/107  Pulse: (!) 44 76 76 82  Resp: '16 17 18 17  '$ Temp:      TempSrc:      SpO2: 96% 97% 97% 96%  Weight:      Height:          Constitutional: NAD, calm, comfortable Eyes: PERRL, lids and conjunctivae normal ENMT: Mucous membranes are moist. Posterior pharynx clear of any exudate or  lesions.Normal dentition.  Neck: normal, supple, no masses, no thyromegaly Respiratory: clear to auscultation bilaterally, no wheezing, no crackles. Normal respiratory effort. No accessory muscle use.  Cardiovascular: Regular rate and rhythm, no murmurs / rubs / gallops. No extremity edema. 2+ pedal pulses. No carotid bruits.  Abdomen: no tenderness, no masses palpated. No hepatosplenomegaly. Bowel sounds positive.  Musculoskeletal: no clubbing / cyanosis. No joint deformity upper and lower extremities. Good ROM, no contractures. Normal muscle tone.  Skin: no rashes, lesions, ulcers. No induration Neurologic: CN 2-12 grossly intact. Sensation intact, DTR normal. Strength 5/5 in all 4.  Psychiatric: Normal judgment and insight. Alert and oriented x 3. Normal mood.     Labs on Admission: I have personally reviewed following labs and imaging studies  CBC: Recent Labs  Lab 12/07/20  1340 12/07/20 1343  WBC  --  4.4  NEUTROABS  --  1.6*  HGB 16.0 15.9  HCT 47.0 48.3  MCV  --  87.0  PLT  --  0000000   Basic Metabolic Panel: Recent Labs  Lab 12/07/20 1340 12/07/20 1343  NA 138 137  K 3.8 3.7  CL 104 105  CO2  --  22  GLUCOSE 104* 104*  BUN 11 9  CREATININE 0.80 0.93  CALCIUM  --  9.3   GFR: Estimated Creatinine Clearance: 67 mL/min (by C-G formula based on SCr of 0.93 mg/dL). Liver Function Tests: Recent Labs  Lab 12/07/20 1343  AST 21  ALT 24  ALKPHOS 25*  BILITOT 1.6*  PROT 6.5  ALBUMIN 3.9   No results for input(s): LIPASE, AMYLASE in the last 168 hours. No results for input(s): AMMONIA in the last 168 hours. Coagulation Profile: Recent Labs  Lab 12/07/20 1343  INR 1.0   Cardiac Enzymes: No results for input(s): CKTOTAL, CKMB, CKMBINDEX, TROPONINI in the last 168 hours. BNP (last 3 results) No results for input(s): PROBNP in the last 8760 hours. HbA1C: No results for input(s): HGBA1C in the last 72 hours. CBG: No results for input(s): GLUCAP in the last 168  hours. Lipid Profile: No results for input(s): CHOL, HDL, LDLCALC, TRIG, CHOLHDL, LDLDIRECT in the last 72 hours. Thyroid Function Tests: No results for input(s): TSH, T4TOTAL, FREET4, T3FREE, THYROIDAB in the last 72 hours. Anemia Panel: No results for input(s): VITAMINB12, FOLATE, FERRITIN, TIBC, IRON, RETICCTPCT in the last 72 hours. Urine analysis:    Component Value Date/Time   COLORURINE STRAW (A) 12/07/2020 1522   APPEARANCEUR CLEAR 12/07/2020 1522   LABSPEC 1.005 12/07/2020 1522   PHURINE 7.0 12/07/2020 1522   GLUCOSEU NEGATIVE 12/07/2020 1522   HGBUR NEGATIVE 12/07/2020 1522   BILIRUBINUR NEGATIVE 12/07/2020 1522   KETONESUR NEGATIVE 12/07/2020 1522   PROTEINUR NEGATIVE 12/07/2020 1522   UROBILINOGEN 0.2 01/05/2009 0535   NITRITE NEGATIVE 12/07/2020 1522   LEUKOCYTESUR NEGATIVE 12/07/2020 1522   Sepsis Labs: !!!!!!!!!!!!!!!!!!!!!!!!!!!!!!!!!!!!!!!!!!!! '@LABRCNTIP'$ (procalcitonin:4,lacticidven:4) ) Recent Results (from the past 240 hour(s))  Resp Panel by RT-PCR (Flu A&B, Covid) Nasopharyngeal Swab     Status: None   Collection Time: 12/07/20  1:45 PM   Specimen: Nasopharyngeal Swab; Nasopharyngeal(NP) swabs in vial transport medium  Result Value Ref Range Status   SARS Coronavirus 2 by RT PCR NEGATIVE NEGATIVE Final    Comment: (NOTE) SARS-CoV-2 target nucleic acids are NOT DETECTED.  The SARS-CoV-2 RNA is generally detectable in upper respiratory specimens during the acute phase of infection. The lowest concentration of SARS-CoV-2 viral copies this assay can detect is 138 copies/mL. A negative result does not preclude SARS-Cov-2 infection and should not be used as the sole basis for treatment or other patient management decisions. A negative result may occur with  improper specimen collection/handling, submission of specimen other than nasopharyngeal swab, presence of viral mutation(s) within the areas targeted by this assay, and inadequate number of  viral copies(<138 copies/mL). A negative result must be combined with clinical observations, patient history, and epidemiological information. The expected result is Negative.  Fact Sheet for Patients:  EntrepreneurPulse.com.au  Fact Sheet for Healthcare Providers:  IncredibleEmployment.be  This test is no t yet approved or cleared by the [place] FDA and  has been authorized for detection and/or diagnosis of SARS-CoV-2 by FDA under an Emergency Use Authorization (EUA). This EUA will remain  in effect (meaning this test can be used) for the  duration of the COVID-19 declaration under Section 564(b)(1) of the Act, 21 U.S.C.section 360bbb-3(b)(1), unless the authorization is terminated  or revoked sooner.       Influenza A by PCR NEGATIVE NEGATIVE Final   Influenza B by PCR NEGATIVE NEGATIVE Final    Comment: (NOTE) The Xpert Xpress SARS-CoV-2/FLU/RSV plus assay is intended as an aid in the diagnosis of influenza from Nasopharyngeal swab specimens and should not be used as a sole basis for treatment. Nasal washings and aspirates are unacceptable for Xpert Xpress SARS-CoV-2/FLU/RSV testing.  Fact Sheet for Patients: EntrepreneurPulse.com.au  Fact Sheet for Healthcare Providers: IncredibleEmployment.be  This test is not yet approved or cleared by the [place] FDA and has been authorized for detection and/or diagnosis of SARS-CoV-2 by FDA under an Emergency Use Authorization (EUA). This EUA will remain in effect (meaning this test can be used) for the duration of the COVID-19 declaration under Section 564(b)(1) of the Act, 21 U.S.C. section 360bbb-3(b)(1), unless the authorization is terminated or revoked.  Performed at [facility], [place] [address] [place], [place] [address]      Radiological Exams on Admission: CT HEAD WO CONTRAST  Result Date: 12/07/2020 CLINICAL DATA:  TIA, aphasia  EXAM: CT HEAD WITHOUT CONTRAST TECHNIQUE: Contiguous axial images were obtained from the base of the skull through the vertex without intravenous contrast. COMPARISON:  CT head 05/02/2020, MR head 05/01/2020 FINDINGS: Brain: There is no evidence of acute intracranial hemorrhage, extra-axial fluid collection, or acute infarct. There is global parenchymal volume loss with ex vacuo dilatation of the ventricular system, similar to the prior study. Remote lacunar infarcts in the right frontal lobe and left basal ganglia are similar to the prior study confluent hypodensity throughout the remainder of the subcortical and periventricular white matter is also again seen, nonspecific but likely reflecting sequela of advanced chronic white matter microangiopathy. No mass lesion is seen. There is no midline shift. Vascular: There is dense calcification of the cavernous ICAs and vertebral arteries. A peripherally calcified aneurysm at the vertebrobasilar junction is again noted, suboptimally assessed on this noncontrast study. Skull: Normal. Negative for fracture or focal lesion. Sinuses/Orbits: The paranasal sinuses are clear. There are bilateral lens implants in place. The orbits are otherwise unremarkable. Other: The mastoid air cells are clear. IMPRESSION: 1. No acute intracranial pathology. 2. Unchanged global parenchymal volume loss, chronic white matter microangiopathy, and remote lacunar infarcts. 3. Grossly unchanged peripherally calcified vertebrobasilar artery aneurysm, suboptimally assessed on the current study. Electronically Signed   By: Valetta Mole MD   On: 12/07/2020 15:13     All images have been reviewed by me personally.    Assessment/Plan Active Problems:   Atrial fibrillation (HCC)   Chronic atrial fibrillation (HCC)   Chronic anticoagulation   HLD (hyperlipidemia)   Essential hypertension   Cerebrovascular accident (CVA) due to embolism of right posterior cerebral artery (HCC)   Thalamic  hemorrhage (HCC)   Transient ischemic attack    Transient ischemic attack, slurred speech - His symptoms are resolved.  Admitted for observation.  CT head is negative - Telemetry monitoring - MRI brain without contrast - Neurology consulted by ED - Check A1c and lipid panel  Chronic atrial fibrillation - Continue Eliquis 2.5 mg twice daily.  Cardizem 240 mg daily.  Hold hydralazine  Essential hypertension - On Cardizem.  Holding hydralazine, Maxide  Hyperlipidemia - Continue Zetia and Crestor  GERD - PPI   DVT prophylaxis: Eliquis Code Status: Full Family Communication: Wife at  Bedside Consults called: Neurology called by ED Admission status: Obs  Status is: Observation  The patient remains OBS appropriate and will d/c before 2 midnights.  Dispo: The patient is from: Home              Anticipated d/c is to: Home              Patient currently is not medically stable to d/c.   Difficult to place patient No       Time Spent: 65 minutes.  >50% of the time was devoted to discussing the patients care, assessment, plan and disposition with other care givers along with counseling the patient about the risks and benefits of treatment.    Huntley Knoop Arsenio Loader MD [facility]  If 7PM-7AM, please contact night-coverage   12/07/2020, 6:16 PM

## 2020-12-07 NOTE — ED Notes (Signed)
Pt Currently at MRI

## 2020-12-07 NOTE — ED Provider Notes (Signed)
[facility] EMERGENCY DEPARTMENT Provider Note   CSN: [id number] Arrival date & time: 12/07/20  1301     History Chief Complaint  Patient presents with   Aphasia    Steven Meadows Steven a 87 y.o. male.  HPI  Patient presents with aphasia  since waking up at noon (LKN 11:45 pm yesterday).  Started acutely, was intermittent with EMS. No longer present. No associated facial droop or unilateral weakness.  Patient Steven oriented and not confused in the room.  He Steven currently asymptomatic.  Past medical history Steven notable for three prior ischemic strokes, the most recent December 2021.    He has atrial Meadows, Steven anticoagulated on elliquis  Has not missed any doses.  Past Medical History:  Diagnosis Date   Abdominal aortic atherosclerosis (HCC)    Actinic keratoses    Aneurysm of vertebral artery (HCC)    Atrial Meadows (HCC)    Cough    History of mononucleosis    HTN (hypertension)    Hypercholesteremia    Lacunar stroke (HCC)    Low back pain    Olecranon bursitis    Rotator cuff tear    Stroke [facility])     Patient Active Problem List   Diagnosis Date Noted   Hyponatremia    Hypokalemia    AKI (acute kidney injury) ([place])    Benign essential HTN    Slow transit constipation    Thalamic hemorrhage ([place]) 05/03/2020   ICH (intracerebral hemorrhage) ([place]) 04/30/2020   Renal insufficiency 08/18/2019   Acute CVA (cerebrovascular accident) ([place]) 09/03/2018   Cerebrovascular accident (CVA) due to embolism of right posterior cerebral artery ([place]) 07/25/2015   Chronic anticoagulation 01/23/2015   HLD (hyperlipidemia) 01/23/2015   Essential hypertension 01/23/2015   Brain aneurysm 01/23/2015   History of CVA (cerebrovascular accident) 12/21/2014   Chronic atrial Meadows ([place]) 12/21/2014   Aneurysm of vertebral artery ([place]) 12/21/2014   Stroke with cerebral ischemia ([place])    TIA (transient ischemic attack) 12/20/2014   Atrial Meadows ([place])  12/20/2014   Hyperlipidemia 06/17/2011   Stroke ([place]) 06/17/2011   Cough 06/17/2011    Past Surgical History:  Procedure Laterality Date   CATARACT EXTRACTION--right eye  2013   TONSILLECTOMY AND ADENOIDECTOMY         Family History  Problem Relation Age of Onset   Heart attack Paternal Grandfather    Heart attack Maternal Grandfather    Cancer Paternal Grandmother    Healthy Mother    Transient ischemic attack Mother    Healthy Father    Healthy Sister     Social History   Tobacco Use   Smoking status: Never   Smokeless tobacco: Never  Vaping Use   Vaping Use: Never used  Substance Use Topics   Alcohol use: Yes    Alcohol/week: 0.0 standard drinks    Comment: 2 drinks per night   Drug use: No    Home Medications Prior to Admission medications   Medication Sig Start Date End Date Taking? Authorizing Provider  acetaminophen (TYLENOL) 325 MG tablet Take 2 tablets (650 mg total) by mouth every 4 (four) hours as needed for mild pain (or temp > 37.5 C (99.5 F)). Patient not taking: Reported on 06/07/2020 05/14/20   Angiulli, Lavon Paganini, PA-C  acetaminophen (TYLENOL) 325 MG tablet 1 tablet as needed Patient not taking: Reported on 06/07/2020    [provider]  apixaban (ELIQUIS) 5 MG TABS tablet Take 1 tablet by mouth 2 (  two) times daily.    [provider]  chlorhexidine (PERIDEX) 0.12 % solution 15 ml    [provider]  diltiazem (CARDIZEM CD) 240 MG 24 hr capsule Take 1 capsule (240 mg total) by mouth daily. 05/14/20   Angiulli, Lavon Paganini, PA-C  ezetimibe (ZETIA) 10 MG tablet Take 1 tablet (10 mg total) by mouth daily. 05/14/20   Angiulli, Lavon Paganini, PA-C  hydrALAZINE (APRESOLINE) 25 MG tablet Take 1 tablet (25 mg total) by mouth every 8 (eight) hours. 05/14/20   Angiulli, Lavon Paganini, PA-C  loratadine (CLARITIN) 10 MG tablet 1 tablet    [provider]  omeprazole (PRILOSEC) 40 MG capsule Take 40 mg by mouth daily.    [provider]   polyethylene glycol (MIRALAX / GLYCOLAX) 17 g packet Take 17 g by mouth 2 (two) times daily. Patient not taking: Reported on 06/07/2020 05/14/20   Angiulli, Lavon Paganini, PA-C  potassium chloride (KLOR-CON) 10 MEQ tablet Take 1 tablet (10 mEq total) by mouth daily. 05/14/20   Angiulli, Lavon Paganini, PA-C  rosuvastatin (CRESTOR) 10 MG tablet Take 1 tablet by mouth daily.    [provider]  senna-docusate (SENOKOT-S) 8.6-50 MG tablet Take 1 tablet by mouth 2 (two) times daily. 05/03/20   Vonzella Nipple, NP  triamterene-hydrochlorothiazide (MAXZIDE-25) 37.5-25 MG tablet Take 1 tablet by mouth daily. 05/14/20   Angiulli, Lavon Paganini, PA-C    Allergies    Nabumetone, Ace inhibitors, and Flonase [fluticasone propionate]  Review of Systems   Review of Systems  Constitutional:  Negative for chills and fever.  HENT:  Negative for ear pain and sore throat.   Eyes:  Negative for pain and visual disturbance.  Respiratory:  Negative for cough and shortness of breath.   Cardiovascular:  Negative for chest pain and palpitations.  Gastrointestinal:  Negative for abdominal pain and vomiting.  Genitourinary:  Negative for dysuria and hematuria.  Musculoskeletal:  Negative for arthralgias and back pain.  Skin:  Negative for color change and rash.  Neurological:  Positive for speech difficulty. Negative for seizures, syncope, facial asymmetry, weakness, light-headedness and headaches.  All other systems reviewed and are negative.  Physical Exam Updated Vital Signs BP (!) 163/102   Pulse 90   Temp 97.9 F (36.6 C) (Oral)   Resp 18   Ht '5\' 11"'$  (1.803 m)   Wt 98.9 kg   SpO2 95%   BMI 30.41 kg/m   Physical Exam Vitals and nursing note reviewed. Exam conducted with a chaperone present.  Constitutional:      Appearance: Normal appearance.  HENT:     Head: Normocephalic and atraumatic.  Eyes:     General: No scleral icterus.       Right eye: No discharge.        Left eye: No discharge.      Extraocular Movements: Extraocular movements intact.     Pupils: Pupils are equal, round, and reactive to light.  Cardiovascular:     Rate and Rhythm: Normal rate. Rhythm irregular.     Pulses: Normal pulses.     Heart sounds: Normal heart sounds. No murmur heard.   No friction rub. No gallop.  Pulmonary:     Effort: Pulmonary effort Steven normal. No respiratory distress.     Breath sounds: Normal breath sounds.  Abdominal:     General: Abdomen Steven flat. Bowel sounds are normal. There Steven no distension.     Palpations: Abdomen Steven soft.     Tenderness:  There Steven no abdominal tenderness.  Skin:    General: Skin Steven warm and dry.     Coloration: Skin Steven not jaundiced.  Neurological:     Mental Status: He Steven alert and oriented to person, place, and time.     Cranial Nerves: No cranial nerve deficit, dysarthria or facial asymmetry.     Motor: No weakness, seizure activity or pronator drift.     Coordination: Romberg sign negative. Coordination normal. Finger-Nose-Finger Test normal.     Comments: Patient Steven able to follow commands and responds appropriately to questions.  He Steven oriented x3.  Speech Steven appropriate, no aphasia or dysarthria.  No unilateral weakness, upper extremity grip strength Steven equal bilaterally against resistance.  Lower extremity strength equal against resistance.    ED Results / Procedures / Treatments   Labs (all labs ordered are listed, but only abnormal results are displayed) Labs Reviewed  RESP PANEL BY RT-PCR (FLU A&B, COVID) ARPGX2  ETHANOL  PROTIME-INR  APTT  CBC  DIFFERENTIAL  COMPREHENSIVE METABOLIC PANEL  RAPID URINE DRUG SCREEN, HOSP PERFORMED  URINALYSIS, ROUTINE W REFLEX MICROSCOPIC  I-STAT CHEM 8, ED    EKG None  Radiology No results found.  Procedures Procedures   Medications Ordered in ED Medications - No data to display  ED Course  I have reviewed the triage vital signs and the nursing notes.  Pertinent labs & imaging results that  were available during my care of the patient were reviewed by me and considered in my medical decision making (see chart for details).  Clinical Course as of 12/07/20 1756  Fri Dec 07, 2020  1519 ED EKG Patient in atrial Meadows, rate controlled [HS]  1709 This will be third page to consult with neurology - it has been 40 minutes since initial page, 25 since second page. Secretary Steven aware.  [HS]    Clinical Course User Index [HS] Sherrill Raring, PA-C   MDM Rules/Calculators/A&P                           No new neurodeficits on exam.  Patient Steven not currently having any aphasia or speech difficulties, last known normal greater than 12 hours ago and he Steven not a tPA candidate.  He Steven already anticoagulated on Eliquis due to A. fib, history Steven concerning as well as presentation for likely TIA or stroke.    Patient vitals are stable, although he Steven mildly hypertensive.  He has been in atrial Meadows without any RVR.  He Steven resting comfortably, Steven not altered at this time.  Work-up has been unrevealing, but his presentation and history Steven very consistent with a possible TIA.  Spoke with [facility] hospitalist team who will admit the patient.  Spoke with neurology Bartholome Bill they will round on the patient tomorrow.  Recommended MRI without contrast.  Final Clinical Impression(s) / ED Diagnoses Final diagnoses:  None    Rx / DC Orders ED Discharge Orders     None        Sherrill Raring, PA-C 12/07/20 1757    Malvin Johns, MD 12/08/20 [phone]

## 2020-12-07 NOTE — Consult Note (Signed)
NEUROLOGY CONSULTATION NOTE   Date of service: December 07, 2020 Patient Name: Steven Meadows MRN:  [id number] DOB:  09-24-1931 Steven for consult: "Aphasia." Requesting Provider: Damita Lack, MD _ _ _   _ __   _ __ _ _  __ __   _ __   __ _  History of Present Illness  Steven Meadows is a 87 y.o. male with PMH significant for prior ischemic strokes along with hemorrhagic strokes on Eliquis, history of atrial fibrillation, hypertension, hyperlipidemia who woke up with difficulty finding words today.  He reports went to bed at 2245 on 12/06/2020.  He woke up at 11:45 AM this morning and could not talk to himself, was having trouble communicating with his wife.  Wife is at bedside and reports that she also noted that he had a right facial droop.  He called EMS and was brought into the emergency department.  By the time he came into the emergency department, his symptoms were resolving and on my evaluation he had no residual aphasia.  Endorses has been taking his Eliquis as prescribed and has not skipped any doses.  Reports in the past has been tried on Xarelto and was switched over to Eliquis for continued strokes on Xarelto.   ROS   Constitutional Denies weight loss, fever and chills.   HEENT Denies changes in vision and hearing.   Respiratory Denies SOB and cough.   CV Denies palpitations and CP   GI Denies abdominal pain, nausea, vomiting and diarrhea.   GU Denies dysuria and urinary frequency.   MSK Denies myalgia and joint pain.   Skin Denies rash and pruritus.   Neurological Denies headache and syncope.   Psychiatric Denies recent changes in mood. Denies anxiety and depression.    Past History   Past Medical History:  Diagnosis Date   Abdominal aortic atherosclerosis (HCC)    Actinic keratoses    Aneurysm of vertebral artery (HCC)    Atrial fibrillation (HCC)    Cough    History of mononucleosis    HTN (hypertension)    Hypercholesteremia    Lacunar stroke (HCC)    Low back  pain    Olecranon bursitis    Rotator cuff tear    Stroke [facility])    Past Surgical History:  Procedure Laterality Date   CATARACT EXTRACTION--right eye  2013   TONSILLECTOMY AND ADENOIDECTOMY     Family History  Problem Relation Age of Onset   Heart attack Paternal Grandfather    Heart attack Maternal Grandfather    Cancer Paternal Grandmother    Healthy Mother    Transient ischemic attack Mother    Healthy Father    Healthy Sister    Social History   Socioeconomic History   Marital status: Married    Spouse name: Steven Meadows   Number of children: Not on file   Years of education: Not on file   Highest education level: Not on file  Occupational History   Not on file  Tobacco Use   Smoking status: Never   Smokeless tobacco: Never  Vaping Use   Vaping Use: Never used  Substance and Sexual Activity   Alcohol use: Yes    Alcohol/week: 0.0 standard drinks    Comment: 2 drinks per night   Drug use: No   Sexual activity: Not on file  Other Topics Concern   Not on file  Social History Narrative   Lives with wife   Right Handed  Drinks 2-3 cups caffeine daily   Social Determinants of Health   Financial Resource Strain: Not on file  Food Insecurity: Not on file  Transportation Needs: Not on file  Physical Activity: Not on file  Stress: Not on file  Social Connections: Not on file   Allergies  Allergen Reactions   Nabumetone Other (See Comments)    Gi upset   Ace Inhibitors Cough   Flonase [Fluticasone Propionate] Other (See Comments)    Causes nosebleeds    Medications  (Not in a hospital admission)    Vitals   Vitals:   12/07/20 1600 12/07/20 1615 12/07/20 1630 12/07/20 1730  BP: (!) 160/99 (!) 156/111 (!) 165/124 (!) 162/107  Pulse: (!) 44 76 76 82  Resp: '16 17 18 17  '$ Temp:      TempSrc:      SpO2: 96% 97% 97% 96%  Weight:      Height:         Body mass index is 30.41 kg/m.  Physical Exam   General: Laying comfortably in bed; in no acute  distress.  HENT: Normal oropharynx and mucosa. Normal external appearance of ears and nose.  Neck: Supple, no pain or tenderness  CV: No JVD. No peripheral edema.  Pulmonary: Symmetric Chest rise. Normal respiratory effort.  Abdomen: Soft to touch, non-tender.  Ext: No cyanosis, edema, or deformity  Skin: No rash. Normal palpation of skin.   Musculoskeletal: Normal digits and nails by inspection. No clubbing.   Neurologic Examination  Mental status/Cognition: Alert, oriented to self, place, month and year, good attention.  Speech/language: Fluent, comprehension intact, object naming intact, repetition intact.  Cranial nerves:   CN II Pupils equal and reactive to light, no VF deficits    CN III,IV,VI EOM intact, no gaze preference or deviation, no nystagmus    CN V normal sensation in V1, V2, and V3 segments bilaterally    CN VII no asymmetry, no nasolabial fold flattening    CN VIII normal hearing to speech    CN IX & X normal palatal elevation, no uvular deviation    CN XI 5/5 head turn and 5/5 shoulder shrug bilaterally    CN XII midline tongue protrusion    Motor:  Muscle bulk: normal, tone normal, pronator drift none tremor none Mvmt Root Nerve  Muscle Right Left Comments  SA C5/6 Ax Deltoid 5 5   EF C5/6 Mc Biceps 5 5   EE C6/7/8 Rad Triceps 5 5   WF C6/7 Med FCR     WE C7/8 PIN ECU     F Ab C8/T1 U ADM/FDI 5 5   HF L1/2/3 Fem Illopsoas 5 5   KE L2/3/4 Fem Quad 5 5   DF L4/5 D Peron Tib Ant 5 5   PF S1/2 Tibial Grc/Sol 5 5    Reflexes:  Right Left Comments  Pectoralis      Biceps (C5/6) 1 1   Brachioradialis (C5/6) 2 2    Triceps (C6/7) 1 1    Patellar (L3/4) 0 0    Achilles (S1) 0 0    Hoffman      Plantar     Jaw jerk    Sensation:  Light touch intact   Pin prick    Temperature    Vibration   Proprioception    Coordination/Complex Motor:  - Finger to Nose intact BL - Heel to shin intact BL - Rapid alternating movement intact BL - Gait:  Deferred.  Labs  CBC:  Recent Labs  Lab 12/07/20 1340 12/07/20 1343  WBC  --  4.4  NEUTROABS  --  1.6*  HGB 16.0 15.9  HCT 47.0 48.3  MCV  --  87.0  PLT  --  0000000    Basic Metabolic Panel:  Lab Results  Component Value Date   NA 137 12/07/2020   K 3.7 12/07/2020   CO2 22 12/07/2020   GLUCOSE 104 (H) 12/07/2020   BUN 9 12/07/2020   CREATININE 0.93 12/07/2020   CALCIUM 9.3 12/07/2020   GFRNONAA >60 12/07/2020   GFRAA 61 08/18/2019   Lipid Panel:  Lab Results  Component Value Date   LDLCALC 31 05/01/2020   HgbA1c:  Lab Results  Component Value Date   HGBA1C 6.0 (H) 05/01/2020   Urine Drug Screen:     Component Value Date/Time   LABOPIA NONE DETECTED 12/07/2020 1522   COCAINSCRNUR NONE DETECTED 12/07/2020 1522   LABBENZ NONE DETECTED 12/07/2020 1522   AMPHETMU NONE DETECTED 12/07/2020 1522   THCU NONE DETECTED 12/07/2020 1522   LABBARB NONE DETECTED 12/07/2020 1522    Alcohol Level     Component Value Date/Time   ETH <10 12/07/2020 1343    CT Head without contrast: Personally reviewed and CTH was negative for a large hypodensity concerning for a large territory infarct or hyperdensity concerning for an ICH  MR Angio head without contrast and Carotid Duplex BL: pending  MRI Brain: pending Impression   WHELAN SAHM is a 87 y.o. male with PMH significant for prior ischemic strokes along with hemorrhagic strokes on Eliquis, history of atrial fibrillation, hypertension, hyperlipidemia who woke up with difficulty finding words today with spontaneous resolution of his symptoms. His neurologic examination is notable for no focal deficit.  Primary Diagnosis:  Cerebral infarction due to embolism of  left middle cerebral artery.   Secondary Diagnosis: Essential (primary) hypertension and Paroxysmal atrial fibrillation  Recommendations   - Frequent Neuro checks per stroke unit protocol - Recommend brain imaging with MRI Brain without contrast -  Recommend Vascular imaging with MRA Angio Head without contrast and US Carotid doppler - Recommend obtaining TTE - Recommend obtaining Lipid panel with LDL - Please start statin if LDL > 70 - Recommend HbA1c - continue Eliquis 2.'5mg'$  BID - Recommend DVT ppx - SBP goal - permissive hypertension first 24 h < 220/110. Held home meds.  - Recommend Telemetry monitoring for arrythmia - Recommend bedside swallow screen prior to PO intake. - Stroke education booklet - Recommend PT/OT/SLP consult - stroke team to follow.  ______________________________________________________________________   Thank you for the opportunity to take part in the care of this patient. If you have any further questions, please contact the neurology consultation attending.  Signed,  [place] Pager Number [phone] _ _ _   _ __   _ __ _ _  __ __   _ __   __ _

## 2020-12-07 NOTE — ED Notes (Signed)
Patient transported to CT 

## 2020-12-07 NOTE — ED Notes (Signed)
Patient transported to MRI 

## 2020-12-08 ENCOUNTER — Observation Stay (HOSPITAL_BASED_OUTPATIENT_CLINIC_OR_DEPARTMENT_OTHER): Payer: Medicare Other

## 2020-12-08 DIAGNOSIS — I6389 Other cerebral infarction: Secondary | ICD-10-CM | POA: Diagnosis not present

## 2020-12-08 DIAGNOSIS — I639 Cerebral infarction, unspecified: Secondary | ICD-10-CM | POA: Diagnosis not present

## 2020-12-08 DIAGNOSIS — I610 Nontraumatic intracerebral hemorrhage in hemisphere, subcortical: Secondary | ICD-10-CM

## 2020-12-08 DIAGNOSIS — I63431 Cerebral infarction due to embolism of right posterior cerebral artery: Secondary | ICD-10-CM | POA: Diagnosis not present

## 2020-12-08 DIAGNOSIS — I10 Essential (primary) hypertension: Secondary | ICD-10-CM | POA: Diagnosis not present

## 2020-12-08 DIAGNOSIS — I4811 Longstanding persistent atrial fibrillation: Secondary | ICD-10-CM | POA: Diagnosis not present

## 2020-12-08 LAB — LIPID PANEL
Cholesterol: 132 mg/dL (ref 0–200)
HDL: 26 mg/dL — ABNORMAL LOW (ref >40)
LDL Cholesterol: 98 mg/dL (ref 0–99)
Total CHOL/HDL Ratio: 5.1 RATIO
Triglycerides: 39 mg/dL (ref <150)
VLDL: 8 mg/dL (ref 0–40)

## 2020-12-08 LAB — HEMOGLOBIN A1C
Hgb A1c MFr Bld: 6 % — ABNORMAL HIGH (ref 4.8–5.6)
Mean Plasma Glucose: 125.5 mg/dL

## 2020-12-08 LAB — ECHOCARDIOGRAM COMPLETE
AR max vel: 1.97 cm2
AV Area VTI: 1.87 cm2
AV Area mean vel: 1.96 cm2
AV Mean grad: 2 mmHg
AV Peak grad: 4.1 mmHg
Ao pk vel: 1.01 m/s
Height: 71 in
P 1/2 time: 866 msec
S' Lateral: 3.5 cm
Weight: 3488.56 oz

## 2020-12-08 LAB — BASIC METABOLIC PANEL
Anion gap: 9 (ref 5–15)
BUN: 9 mg/dL (ref 8–23)
CO2: 25 mmol/L (ref 22–32)
Calcium: 9.5 mg/dL (ref 8.9–10.3)
Chloride: 101 mmol/L (ref 98–111)
Creatinine, Ser: 0.9 mg/dL (ref 0.61–1.24)
GFR, Estimated: >60 mL/min (ref >60)
Glucose, Bld: 98 mg/dL (ref 70–99)
Potassium: 3.9 mmol/L (ref 3.5–5.1)
Sodium: 135 mmol/L (ref 135–145)

## 2020-12-08 LAB — MAGNESIUM: Magnesium: 2 mg/dL (ref 1.7–2.4)

## 2020-12-08 MED ORDER — PERFLUTREN LIPID MICROSPHERE
1.0000 mL | INTRAVENOUS | Status: AC | PRN
  Administered 2020-12-08: 3 mL via INTRAVENOUS
  Filled 2020-12-08: qty 10

## 2020-12-08 MED ORDER — APIXABAN 5 MG PO TABS
5.0000 mg | ORAL_TABLET | Freq: Two times a day (BID) | ORAL | Status: DC
  Administered 2020-12-08: 5 mg via ORAL
  Filled 2020-12-08: qty 1

## 2020-12-08 MED ORDER — ROSUVASTATIN CALCIUM 20 MG PO TABS
20.0000 mg | ORAL_TABLET | Freq: Every day | ORAL | Status: DC
  Administered 2020-12-08: 20 mg via ORAL
  Filled 2020-12-08: qty 1

## 2020-12-08 NOTE — Progress Notes (Signed)
  Echocardiogram 2D Echocardiogram has been performed.  Steven Meadows 12/08/2020, 3:29 PM

## 2020-12-08 NOTE — ED Notes (Signed)
Attempted to call report x 1  

## 2020-12-08 NOTE — Evaluation (Signed)
Occupational Therapy Evaluation Patient Details Name: Steven Meadows MRN: [id number] DOB: 06-May-1931 Today's Date: 12/08/2020    History of Present Illness 87 yo male with slurred speech was noted to have his tongue deviate to one side by wife, came to ED.  Has new stroke findings of acute R parietal lobe ischemia, no findings of blood work.  PMHx:  CVA of R posterior cerebral artery and thalamic hemorrhage, a-fib, HLD, HTN, TIA, cerebral aneurysm of vertebrobasilar confluence   Clinical Impression   Pt admitted with the above diagnosis and demonstrates the below listed deficits.  He currently requires min guard assist for ADLs and functional mobility.  He also demonstrates ? Mild Lt spatial inattention.  Cognition appears at baseline, per wife's report.  He lives at home with his wife, and was mod I with ADLs PTA.  Recommend HHOT.  All further OT needs can be addressed by [facility], therefore, acute OT will sign off at this time.     Follow Up Recommendations  Home health OT;Supervision - Intermittent    Equipment Recommendations  None recommended by OT    Recommendations for Other Services       Precautions / Restrictions Precautions Precautions: Fall      Mobility Bed Mobility Overal bed mobility: Needs Assistance Bed Mobility: Sit to Supine       Sit to supine: Supervision   General bed mobility comments: onto ED stretcher    Transfers                      Balance Overall balance assessment: Needs assistance Sitting-balance support: Feet supported Sitting balance-Leahy Scale: Fair     Standing balance support: Single extremity supported Standing balance-Leahy Scale: Poor Standing balance comment: requires UE support                           ADL either performed or assessed with clinical judgement   ADL Overall ADL's : Needs assistance/impaired Eating/Feeding: Independent   Grooming: Wash/dry hands;Wash/dry face;Oral care;Brushing  hair;Supervision/safety;Standing   Upper Body Bathing: Set up;Sitting   Lower Body Bathing: Min guard;Sit to/from stand   Upper Body Dressing : Set up;Sitting   Lower Body Dressing: Min guard;Sit to/from stand Lower Body Dressing Details (indicate cue type and reason): Pt demonstrates posterior lean.  He reports he sits in a supportive chair when performing LB dressing Toilet Transfer: Min guard;Comfort height toilet;Ambulation;Grab bars;RW   Toileting- Water quality scientist and Hygiene: Min guard;Sit to/from stand       Functional mobility during ADLs: Min guard;Rolling walker General ADL Comments: Pt moves slowly     Vision Baseline Vision/History: No visual deficits Patient Visual Report: No change from baseline Vision Assessment?: Yes Eye Alignment: Within Functional Limits Ocular Range of Motion: Within Functional Limits Alignment/Gaze Preference: Within Defined Limits Tracking/Visual Pursuits: Able to track stimulus in all quads without difficulty Convergence: Within functional limits Visual Fields: No apparent deficits     Perception Perception Perception Tested?: Yes Comments: Pt with ? mild Lt spatial inattention.  He drifts toward the right when walking and requires cues to move to the Lt.  He also demonstrated difficulty scooting his body to the left in the bed   Praxis Praxis Praxis tested?: Within functional limits    Pertinent Vitals/Pain Pain Assessment: No/denies pain     Hand Dominance Right   Extremity/Trunk Assessment Upper Extremity Assessment Upper Extremity Assessment: Overall WFL for tasks assessed   Lower Extremity  Assessment Lower Extremity Assessment: Defer to PT evaluation   Cervical / Trunk Assessment Cervical / Trunk Assessment: Kyphotic   Communication Communication Communication: No difficulties   Cognition Arousal/Alertness: Awake/alert Behavior During Therapy: WFL for tasks assessed/performed Overall Cognitive Status: History  of cognitive impairments - at baseline                                 General Comments: Pt incorrectly reports number of grand children and that he has great grand children when has none.  His wife indicates this his baseline.  She reports he speech is occasionally still somewhat delayed   General Comments  wife present during eval    Exercises     Shoulder Instructions      Home Living Family/patient expects to be discharged to:: Private residence Living Arrangements: Spouse/significant other Available Help at Discharge: Family;Available 24 hours/day Type of Home: House Home Access: Stairs to enter [employer] of Steps: 2 Entrance Stairs-Rails: Left Home Layout: Two level;Able to live on main level with bedroom/bathroom Alternate Level Stairs-Number of Steps: flight   Bathroom Shower/Tub: Occupational psychologist: Standard Bathroom Accessibility: Yes   Home Equipment: Grab bars - toilet;Grab bars - tub/shower;Cane - single point;Walker - 4 wheels;Walker - 2 wheels          Prior Functioning/Environment Level of Independence: Independent;Independent with assistive device(s)        Comments: Pt ambulates with a rollator.  He no longer drives.  He was an avid rose gardener, but now has someone who assists him.  He reports he spends much of his day watching TV        OT Problem List: Decreased strength;Decreased activity tolerance;Impaired balance (sitting and/or standing);Impaired vision/perception;Decreased cognition      OT Treatment/Interventions:      OT Goals(Current goals can be found in the care plan section) Acute Rehab OT Goals Patient Stated Goal: to hopefully go home soon OT Goal Formulation: All assessment and education complete, DC therapy  OT Frequency:     Barriers to D/C:            Co-evaluation              AM-PAC OT "6 Clicks" Daily Activity     Outcome Measure Help from another person eating meals?:  None Help from another person taking care of personal grooming?: A Little Help from another person toileting, which includes using toliet, bedpan, or urinal?: A Little Help from another person bathing (including washing, rinsing, drying)?: A Little Help from another person to put on and taking off regular upper body clothing?: A Little Help from another person to put on and taking off regular lower body clothing?: A Little 6 Click Score: 19   End of Session Equipment Utilized During Treatment: Rolling walker Nurse Communication: Mobility status  Activity Tolerance: Patient tolerated treatment well Patient left: in bed;with call bell/phone within reach;with family/visitor present  OT Visit Diagnosis: Unsteadiness on feet (R26.81);Cognitive communication deficit (R41.841) Symptoms and signs involving cognitive functions: Cerebral infarction                Time: 1220-1250 OT Time Calculation (min): 30 min Charges:  OT General Charges $OT Visit: 1 Visit OT Evaluation $OT Eval Moderate Complexity: 1 Mod OT Treatments $Self Care/Home Management : 8-22 mins  Nilsa Nutting., OTR/L Acute Rehabilitation Services Pager [phone] Office [phone]   Lucille Passy M 12/08/2020, 2:05  PM

## 2020-12-08 NOTE — Progress Notes (Signed)
PROGRESS NOTE    Steven Meadows  [id number] DOB: [birth year] DOA: 12/07/2020 PCP: Josetta Huddle, MD   Brief Narrative:  87 y.o. male with medical history significant of ischemic and hemorrhagic CVA on low-dose Eliquis, atrial fibrillation, HTN, HLD, rotator cuff tear, arthritis comes to the hospital for evaluation of slurred speech.  Upon admission his symptoms are resolved but MRI brain showed acute CVA, neurology team was consulted.   Assessment & Plan:   Active Problems:   Atrial fibrillation (HCC)   Chronic atrial fibrillation (HCC)   Chronic anticoagulation   HLD (hyperlipidemia)   Essential hypertension   Cerebrovascular accident (CVA) due to embolism of right posterior cerebral artery (HCC)   Thalamic hemorrhage (HCC)   Transient ischemic attack  Acute right parietal lobe ischemia Slurred speech, resolved - His symptoms are largely resolved.  CT head is negative.  MRI brain/MRA-shows acute/subacute right parietal lobe ischemia, unchanged 6 mm aneurysm - Neurology consulted by ED - A1c-6.0, LDL 98   Chronic atrial fibrillation - Continue Eliquis 2.5 mg twice daily.  Cardizem 240 mg daily.  Hold hydralazine Likely will need to change Eliquis to full dose   Essential hypertension - On Cardizem.  Holding hydralazine, Maxide   Hyperlipidemia - Continue Zetia and Crestor   GERD - PPI       DVT prophylaxis: Eliquis Code Status: Full code Family Communication: Wife present at bedside    Dispo: The patient is from: Home              Anticipated d/c is to: Home              Patient currently is not medically stable to d/c.  Ongoing evaluation for his stroke, neurology team is following.   Difficult to place patient No        Subjective: Seen and examined at bedside, overall feels okay.  No acute events overnight.  Review of Systems Otherwise negative except as per HPI, including: General: Denies fever, chills, night sweats or unintended weight  loss. Resp: Denies cough, wheezing, shortness of breath. Cardiac: Denies chest pain, palpitations, orthopnea, paroxysmal nocturnal dyspnea. GI: Denies abdominal pain, nausea, vomiting, diarrhea or constipation GU: Denies dysuria, frequency, hesitancy or incontinence MS: Denies muscle aches, joint pain or swelling Neuro: Denies headache, neurologic deficits (focal weakness, numbness, tingling), abnormal gait Psych: Denies anxiety, depression, SI/HI/AVH Skin: Denies new rashes or lesions ID: Denies sick contacts, exotic exposures, travel  Examination:  General exam: Appears calm and comfortable  Respiratory system: Clear to auscultation. Respiratory effort normal. Cardiovascular system: S1 & S2 heard, RRR. No JVD, murmurs, rubs, gallops or clicks. No pedal edema. Gastrointestinal system: Abdomen is nondistended, soft and nontender. No organomegaly or masses felt. Normal bowel sounds heard. Central nervous system: Alert and oriented. No focal neurological deficits. Extremities: Symmetric 5 x 5 power. Skin: No rashes, lesions or ulcers Psychiatry: Judgement and insight appear normal. Mood & affect appropriate.     Objective: Vitals:   12/08/20 0710 12/08/20 0800 12/08/20 0900 12/08/20 1000  BP: (!) 162/103 (!) 165/107 (!) 156/91 (!) 150/105  Pulse: 65 84 89 70  Resp: '18 18 20 20  '$ Temp:      TempSrc:      SpO2: 98% 97% 95% 96%  Weight:      Height:        Intake/Output Summary (Last 24 hours) at 12/08/2020 1138 Last data filed at 12/07/2020 2133 Gross per 24 hour  Intake --  Output 475 ml  Net -  475 ml   Filed Weights   12/07/20 1311  Weight: 98.9 kg     Data Reviewed:   CBC: Recent Labs  Lab 12/07/20 1340 12/07/20 1343  WBC  --  4.4  NEUTROABS  --  1.6*  HGB 16.0 15.9  HCT 47.0 48.3  MCV  --  87.0  PLT  --  0000000   Basic Metabolic Panel: Recent Labs  Lab 12/07/20 1340 12/07/20 1343 12/08/20 0702  NA 138 137 135  K 3.8 3.7 3.9  CL 104 105 101  CO2  --  22  25  GLUCOSE 104* 104* 98  BUN '11 9 9  '$ CREATININE 0.80 0.93 0.90  CALCIUM  --  9.3 9.5  MG  --   --  2.0   GFR: Estimated Creatinine Clearance: 69.3 mL/min (by C-G formula based on SCr of 0.9 mg/dL). Liver Function Tests: Recent Labs  Lab 12/07/20 1343  AST 21  ALT 24  ALKPHOS 25*  BILITOT 1.6*  PROT 6.5  ALBUMIN 3.9   No results for input(s): LIPASE, AMYLASE in the last 168 hours. No results for input(s): AMMONIA in the last 168 hours. Coagulation Profile: Recent Labs  Lab 12/07/20 1343  INR 1.0   Cardiac Enzymes: No results for input(s): CKTOTAL, CKMB, CKMBINDEX, TROPONINI in the last 168 hours. BNP (last 3 results) No results for input(s): PROBNP in the last 8760 hours. HbA1C: Recent Labs    12/08/20 0459  HGBA1C 6.0*   CBG: No results for input(s): GLUCAP in the last 168 hours. Lipid Profile: Recent Labs    12/08/20 0459  CHOL 132  HDL 26*  LDLCALC 98  TRIG 39  CHOLHDL 5.1   Thyroid Function Tests: No results for input(s): TSH, T4TOTAL, FREET4, T3FREE, THYROIDAB in the last 72 hours. Anemia Panel: No results for input(s): VITAMINB12, FOLATE, FERRITIN, TIBC, IRON, RETICCTPCT in the last 72 hours. Sepsis Labs: No results for input(s): PROCALCITON, LATICACIDVEN in the last 168 hours.  Recent Results (from the past 240 hour(s))  Resp Panel by RT-PCR (Flu A&B, Covid) Nasopharyngeal Swab     Status: None   Collection Time: 12/07/20  1:45 PM   Specimen: Nasopharyngeal Swab; Nasopharyngeal(NP) swabs in vial transport medium  Result Value Ref Range Status   SARS Coronavirus 2 by RT PCR NEGATIVE NEGATIVE Final    Comment: (NOTE) SARS-CoV-2 target nucleic acids are NOT DETECTED.  The SARS-CoV-2 RNA is generally detectable in upper respiratory specimens during the acute phase of infection. The lowest concentration of SARS-CoV-2 viral copies this assay can detect is 138 copies/mL. A negative result does not preclude SARS-Cov-2 infection and should not be  used as the sole basis for treatment or other patient management decisions. A negative result may occur with  improper specimen collection/handling, submission of specimen other than nasopharyngeal swab, presence of viral mutation(s) within the areas targeted by this assay, and inadequate number of viral copies(<138 copies/mL). A negative result must be combined with clinical observations, patient history, and epidemiological information. The expected result is Negative.  Fact Sheet for Patients:  EntrepreneurPulse.com.au  Fact Sheet for Healthcare Providers:  IncredibleEmployment.be  This test is no t yet approved or cleared by the [place] FDA and  has been authorized for detection and/or diagnosis of SARS-CoV-2 by FDA under an Emergency Use Authorization (EUA). This EUA will remain  in effect (meaning this test can be used) for the duration of the COVID-19 declaration under Section 564(b)(1) of the Act, 21 U.S.C.section 360bbb-3(b)(1), unless the  authorization is terminated  or revoked sooner.       Influenza A by PCR NEGATIVE NEGATIVE Final   Influenza B by PCR NEGATIVE NEGATIVE Final    Comment: (NOTE) The Xpert Xpress SARS-CoV-2/FLU/RSV plus assay is intended as an aid in the diagnosis of influenza from Nasopharyngeal swab specimens and should not be used as a sole basis for treatment. Nasal washings and aspirates are unacceptable for Xpert Xpress SARS-CoV-2/FLU/RSV testing.  Fact Sheet for Patients: EntrepreneurPulse.com.au  Fact Sheet for Healthcare Providers: IncredibleEmployment.be  This test is not yet approved or cleared by the [place] FDA and has been authorized for detection and/or diagnosis of SARS-CoV-2 by FDA under an Emergency Use Authorization (EUA). This EUA will remain in effect (meaning this test can be used) for the duration of the COVID-19 declaration under Section  564(b)(1) of the Act, 21 U.S.C. section 360bbb-3(b)(1), unless the authorization is terminated or revoked.  Performed at [facility], [place] [address], [place], [place] [address]          Radiology Studies: CT HEAD WO CONTRAST  Result Date: 12/07/2020 CLINICAL DATA:  TIA, aphasia EXAM: CT HEAD WITHOUT CONTRAST TECHNIQUE: Contiguous axial images were obtained from the base of the skull through the vertex without intravenous contrast. COMPARISON:  CT head 05/02/2020, MR head 05/01/2020 FINDINGS: Brain: There is no evidence of acute intracranial hemorrhage, extra-axial fluid collection, or acute infarct. There is global parenchymal volume loss with ex vacuo dilatation of the ventricular system, similar to the prior study. Remote lacunar infarcts in the right frontal lobe and left basal ganglia are similar to the prior study confluent hypodensity throughout the remainder of the subcortical and periventricular white matter is also again seen, nonspecific but likely reflecting sequela of advanced chronic white matter microangiopathy. No mass lesion is seen. There is no midline shift. Vascular: There is dense calcification of the cavernous ICAs and vertebral arteries. A peripherally calcified aneurysm at the vertebrobasilar junction is again noted, suboptimally assessed on this noncontrast study. Skull: Normal. Negative for fracture or focal lesion. Sinuses/Orbits: The paranasal sinuses are clear. There are bilateral lens implants in place. The orbits are otherwise unremarkable. Other: The mastoid air cells are clear. IMPRESSION: 1. No acute intracranial pathology. 2. Unchanged global parenchymal volume loss, chronic white matter microangiopathy, and remote lacunar infarcts. 3. Grossly unchanged peripherally calcified vertebrobasilar artery aneurysm, suboptimally assessed on the current study. Electronically Signed   By: Valetta Mole MD   On: 12/07/2020 15:13   MR ANGIO HEAD WO CONTRAST  Result  Date: 12/07/2020 CLINICAL DATA:  Transient ischemic attack.  Slurred speech. EXAM: MRI HEAD WITHOUT CONTRAST MRA HEAD WITHOUT CONTRAST TECHNIQUE: Multiplanar, multi-echo pulse sequences of the brain and surrounding structures were acquired without intravenous contrast. Angiographic images of the Circle of Willis were acquired using MRA technique without intravenous contrast. COMPARISON:  04/30/2020 CTA head FINDINGS: MRI HEAD FINDINGS Brain: Small area of abnormal diffusion restriction within the right parietal lobe. Chronic microhemorrhage in the left thalamus and brainstem. Hyperintense T2-weighted signal is widespread throughout the white matter. Diffuse, severe atrophy. The midline structures are normal. Old right frontal small vessel infarct. Vascular: Major flow voids are preserved. Skull and upper cervical spine: Normal calvarium and skull base. Visualized upper cervical spine and soft tissues are normal. Sinuses/Orbits:No paranasal sinus fluid levels or advanced mucosal thickening. No mastoid or middle ear effusion. Normal orbits. MRA HEAD FINDINGS POSTERIOR CIRCULATION: --Vertebral arteries: Unchanged 6 mm aneurysm at the vertebrobasilar confluence. --Inferior cerebellar arteries:  Normal. --Basilar artery: Normal. --Superior cerebellar arteries: Normal. --Posterior cerebral arteries: Normal. Both are predominantly supplied by the posterior communicating arteries (p-comm). ANTERIOR CIRCULATION: --Intracranial internal carotid arteries: Normal. --Anterior cerebral arteries (ACA): Normal. --Middle cerebral arteries (MCA): Normal. ANATOMIC VARIANTS: Bilateral fetal origins of the posterior cerebral arteries IMPRESSION: 1. Small area of acute or early subacute ischemia within the right parietal lobe. No hemorrhage or mass effect. 2. 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 3. Diffuse, severe atrophy and chronic small vessel ischemic changes. Electronically Signed   By: Ulyses Jarred M.D.   On:  12/07/2020 21:18   MR BRAIN WO CONTRAST  Result Date: 12/07/2020 CLINICAL DATA:  Transient ischemic attack.  Slurred speech. EXAM: MRI HEAD WITHOUT CONTRAST MRA HEAD WITHOUT CONTRAST TECHNIQUE: Multiplanar, multi-echo pulse sequences of the brain and surrounding structures were acquired without intravenous contrast. Angiographic images of the Circle of Willis were acquired using MRA technique without intravenous contrast. COMPARISON:  04/30/2020 CTA head FINDINGS: MRI HEAD FINDINGS Brain: Small area of abnormal diffusion restriction within the right parietal lobe. Chronic microhemorrhage in the left thalamus and brainstem. Hyperintense T2-weighted signal is widespread throughout the white matter. Diffuse, severe atrophy. The midline structures are normal. Old right frontal small vessel infarct. Vascular: Major flow voids are preserved. Skull and upper cervical spine: Normal calvarium and skull base. Visualized upper cervical spine and soft tissues are normal. Sinuses/Orbits:No paranasal sinus fluid levels or advanced mucosal thickening. No mastoid or middle ear effusion. Normal orbits. MRA HEAD FINDINGS POSTERIOR CIRCULATION: --Vertebral arteries: Unchanged 6 mm aneurysm at the vertebrobasilar confluence. --Inferior cerebellar arteries: Normal. --Basilar artery: Normal. --Superior cerebellar arteries: Normal. --Posterior cerebral arteries: Normal. Both are predominantly supplied by the posterior communicating arteries (p-comm). ANTERIOR CIRCULATION: --Intracranial internal carotid arteries: Normal. --Anterior cerebral arteries (ACA): Normal. --Middle cerebral arteries (MCA): Normal. ANATOMIC VARIANTS: Bilateral fetal origins of the posterior cerebral arteries IMPRESSION: 1. Small area of acute or early subacute ischemia within the right parietal lobe. No hemorrhage or mass effect. 2. 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 3. Diffuse, severe atrophy and chronic small vessel ischemic changes.  Electronically Signed   By: Ulyses Jarred M.D.   On: 12/07/2020 21:18   VAS US CAROTID  Result Date: 12/08/2020 Carotid Arterial Duplex Study Patient Name:  TEMILOLUWA SPURLOCK  Date of Exam:   12/08/2020 Medical Rec #: [id number]       Accession #:    [id number] Date of Birth: 21-Dec-1931        Patient Gender: M Patient Age:   66 years Exam Location:  [facility] Procedure:      VAS US CAROTID Referring Phys: Alferd Patee [facility] --------------------------------------------------------------------------------  Indications:       CVA. Risk Factors:      Hypertension, hyperlipidemia, no history of smoking, prior                    CVA. Other Factors:     TIAs, Afib. Comparison Study:  CTA 04/30/20 - WNL                    Carotid duplex 12/21/14 - BIL 1-39% Performing Technologist: Rogelia Rohrer RVT, RDMS  Examination Guidelines: A complete evaluation includes B-mode imaging, spectral Doppler, color Doppler, and power Doppler as needed of all accessible portions of each vessel. Bilateral testing is considered an integral part of a complete examination. Limited examinations for reoccurring indications may be performed as noted.  Right Carotid Findings: +----------+--------+--------+--------+----------------------+---------+  PSV cm/sEDV cm/sStenosisPlaque Description    Comments  +----------+--------+--------+--------+----------------------+---------+ CCA Prox  63      10                                              +----------+--------+--------+--------+----------------------+---------+ CCA Distal52      9               heterogenous and focal          +----------+--------+--------+--------+----------------------+---------+ ICA Prox  40      10              calcific and smooth   Shadowing +----------+--------+--------+--------+----------------------+---------+ ICA Distal72      14                                               +----------+--------+--------+--------+----------------------+---------+ ECA       106     8                                               +----------+--------+--------+--------+----------------------+---------+ +----------+--------+-------+----------------+-------------------+           PSV cm/sEDV cmsDescribe        Arm Pressure (mmHG) +----------+--------+-------+----------------+-------------------+ [id number]             Multiphasic, WNL                    +----------+--------+-------+----------------+-------------------+ +---------+--------+--+--------+-+---------+ VertebralPSV cm/s29EDV cm/s7Antegrade +---------+--------+--+--------+-+---------+  Left Carotid Findings: +----------+--------+--------+--------+---------------------+------------------+           PSV cm/sEDV cm/sStenosisPlaque Description   Comments           +----------+--------+--------+--------+---------------------+------------------+ CCA Prox  71      13                                   intimal thickening +----------+--------+--------+--------+---------------------+------------------+ CCA Distal74      13                                   intimal thickening +----------+--------+--------+--------+---------------------+------------------+ ICA Prox  39      9               calcific,                                                                 heterogenous and                                                          smooth                                  +----------+--------+--------+--------+---------------------+------------------+  ICA Distal65      17                                                      +----------+--------+--------+--------+---------------------+------------------+ ECA       97      7               focal and            intimal thickening                                   heterogenous                             +----------+--------+--------+--------+---------------------+------------------+ +----------+--------+--------+----------------+-------------------+           PSV cm/sEDV cm/sDescribe        Arm Pressure (mmHG) +----------+--------+--------+----------------+-------------------+ [id number]              Multiphasic, WNL                    +----------+--------+--------+----------------+-------------------+ +---------+--------+--+--------+-+---------+ VertebralPSV cm/s27EDV cm/s7Antegrade +---------+--------+--+--------+-+---------+   Summary: Right Carotid: The extracranial vessels were near-normal with only minimal wall                thickening or plaque. Left Carotid: The extracranial vessels were near-normal with only minimal wall               thickening or plaque. Vertebrals:  Bilateral vertebral arteries demonstrate antegrade flow. Subclavians: Normal flow hemodynamics were seen in bilateral subclavian              arteries. *See table(s) above for measurements and observations.     Preliminary         Scheduled Meds:   stroke: mapping our early stages of recovery book   Does not apply Once   apixaban  2.5 mg Oral BID   diltiazem  240 mg Oral Daily   ezetimibe  10 mg Oral Daily   pantoprazole  40 mg Oral Daily   polyethylene glycol  17 g Oral BID   rosuvastatin  20 mg Oral Daily   senna-docusate  1 tablet Oral BID   Continuous Infusions:   LOS: 0 days   Time spent= 35 mins    Rhyan Radler Arsenio Loader, MD [facility]  If 7PM-7AM, please contact night-coverage  12/08/2020, 11:38 AM

## 2020-12-08 NOTE — ED Notes (Signed)
Heart healthy lunch tray ordered 

## 2020-12-08 NOTE — ED Notes (Signed)
Sit patient up in the bed to eat his breakfast tray patient is now sitting up eating with call bell in reach

## 2020-12-08 NOTE — Progress Notes (Signed)
Physical Therapy Evaluation Patient Details Name: GRACIELA FRIEDLY MRN: [id number] DOB: 1931/11/22 Today's Date: 12/08/2020   History of Present Illness  87 yo male with slurred speech was noted to have his tongue deviate to one side by wife, came to ED.  Has new stroke findings of acute R parietal lobe ischemia, no findings of blood work.  PMHx:  CVA of R posterior cerebral artery and thalamic hemorrhage, a-fib, HLD, HTN, TIA, cerebral aneurysm of vertebrobasilar confluence  Clinical Impression  Pt was seen for mobility assessment and PT was able to walk him on the hallway with help and cues for safety.  Has limited endurance, but is demonstrating enough capability to go home.  He is appropriate for home health PT and will follow along with him for acute PT goals, but recommend he consider outpatient therapy when home health is done.    Follow Up Recommendations Home health PT;Supervision for mobility/OOB    Equipment Recommendations  Rolling walker with 5" wheels    Recommendations for Other Services       Precautions / Restrictions Precautions Precautions: Fall Precaution Comments: monitor BP Restrictions Weight Bearing Restrictions: No      Mobility  Bed Mobility Overal bed mobility: Needs Assistance Bed Mobility: Supine to Sit;Sit to Supine     Supine to sit: Min assist Sit to supine: Supervision   General bed mobility comments: extra time to get legs onto stretcher    Transfers Overall transfer level: Needs assistance Equipment used: 1 person hand held assist;Rolling walker (2 wheeled) Transfers: Sit to/from Stand Sit to Stand: Min assist         General transfer comment: min assist to steady and control balance initially  Ambulation/Gait Ambulation/Gait assistance: Min guard;Min assist Gait Distance (Feet): 90 Feet Assistive device: Rolling walker (2 wheeled);1 person hand held assist Gait Pattern/deviations: Step-to pattern;Step-through pattern Gait  velocity: reduced Gait velocity interpretation: <1.31 ft/sec, indicative of household ambulator General Gait Details: pt is mildly unsteady, able to maneuver with vc's and reminders for obstacles.  Reports he has not fallen.  Stairs            Wheelchair Mobility    Modified Rankin (Stroke Patients Only)       Balance Overall balance assessment: Needs assistance Sitting-balance support: Feet supported Sitting balance-Leahy Scale: Fair     Standing balance support: Bilateral upper extremity supported Standing balance-Leahy Scale: Poor Standing balance comment: UE walker support                             Pertinent Vitals/Pain Pain Assessment: No/denies pain    Home Living Family/patient expects to be discharged to:: Private residence Living Arrangements: Spouse/significant other Available Help at Discharge: Family;Available 24 hours/day Type of Home: House Home Access: Stairs to enter Entrance Stairs-Rails: Left Entrance Stairs-Number of Steps: 2 Home Layout: Two level;Able to live on main level with bedroom/bathroom Home Equipment: Grab bars - toilet;Grab bars - tub/shower;Cane - single point;Walker - 4 wheels;Walker - 2 wheels      Prior Function Level of Independence: Independent;Independent with assistive device(s)         Comments: 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, uses rollator and is no longer driving     Hand Dominance   Dominant Hand: Right    Extremity/Trunk Assessment   Upper Extremity Assessment Upper Extremity Assessment: Defer to OT evaluation    Lower Extremity Assessment Lower Extremity Assessment: Generalized weakness  Cervical / Trunk Assessment Cervical / Trunk Assessment: Kyphotic  Communication   Communication: No difficulties  Cognition Arousal/Alertness: Awake/alert Behavior During Therapy: WFL for tasks assessed/performed Overall Cognitive Status: History of cognitive impairments - at baseline                                  General Comments: wife is reporting inaccuracies in his answers to questions      General Comments General comments (skin integrity, edema, etc.): wife can confer with PT about the accuracy of pt's history    Exercises     Assessment/Plan    PT Assessment Patient needs continued PT services  PT Problem List Decreased strength;Decreased activity tolerance;Decreased balance;Decreased knowledge of use of DME;Decreased cognition;Decreased safety awareness       PT Treatment Interventions DME instruction;Gait training;Stair training;Functional mobility training;Therapeutic activities;Therapeutic exercise;Balance training;Neuromuscular re-education;Patient/family education    PT Goals (Current goals can be found in the Care Plan section)  Acute Rehab PT Goals Patient Stated Goal: get back home PT Goal Formulation: With patient/family Time For Goal Achievement: 12/22/20 Potential to Achieve Goals: Good    Frequency Min 4X/week   Barriers to discharge Inaccessible home environment;Decreased caregiver support home with wife with stairs    Co-evaluation               AM-PAC PT "6 Clicks" Mobility  Outcome Measure Help needed turning from your back to your side while in a flat bed without using bedrails?: None Help needed moving from lying on your back to sitting on the side of a flat bed without using bedrails?: A Little Help needed moving to and from a bed to a chair (including a wheelchair)?: A Little Help needed standing up from a chair using your arms (e.g., wheelchair or bedside chair)?: A Little Help needed to walk in hospital room?: A Little Help needed climbing 3-5 steps with a railing? : A Lot 6 Click Score: 18    End of Session Equipment Utilized During Treatment: Gait belt Activity Tolerance: Patient tolerated treatment well;Patient limited by fatigue Patient left: in bed;with call bell/phone within reach;with  family/visitor present Nurse Communication: Mobility status PT Visit Diagnosis: Unsteadiness on feet (R26.81);Muscle weakness (generalized) (M62.81);Difficulty in walking, not elsewhere classified (R26.2)    Time: 1203-1229 PT Time Calculation (min) (ACUTE ONLY): 26 min   Charges:   PT Evaluation $PT Eval Moderate Complexity: 1 Mod PT Treatments $Gait Training: 8-22 mins       Ramond Dial 12/08/2020, 3:35 PM  Mee Hives, PT MS Acute Rehab Dept. Number: [place] [facility] [place]

## 2020-12-08 NOTE — Progress Notes (Addendum)
STROKE TEAM PROGRESS NOTE   ATTENDING NOTE: I reviewed above note and agree with the assessment and plan. Pt was seen and examined.   87 year old male with history of stroke, A. fib on Eliquis, history of ICH, hypertension, hyperlipidemia admitted for episode of aphasia.  Currently symptom resolved.  CT no acute abnormality, MRI showed small acute infarct at periventricular white matter near right occipital horn.  MRA unchanged 6 mm vertebral basilar confluence aneurysm.  Carotid Doppler unremarkable, EF 55 to 60%.  A1c 6.0, LDL 98.  Hx stroke/TIA 12/2014 - right splenium infarct on MRI. MRA stable right VA, VBJ fusifum aneurysm. LDL 62 and A1C 6.5. EF 50-55% and CUS neg. Found to have afib RVR, He was discharged with eliquis and cardizem 09/2018 -  right CR small infarct, likely small vessel disease given location. However, he does have afib so embolic etiology can not be completely ruled out. CTA head and neck stable VBJ aneurysm. LDL 54 and A1C 6.0. D/c on asa and eliquis as well as crestor and zetia. Followed up with Dr. Leonie Man at [facility], ASA discontinued, but continued on eliquis 03/2019 EMG showed severe sensorimotor anxonal polyneuropathy 04/2020 admitted for small left thalamic ICH on CT and MRI, reversed with Andexxa.  CTA head and neck 6 mm right VBJ aneurysm stable.  EF 60 to 65%.  LDL 31, A1c 6.0.  Eliquis was on hold, discharged with Zetia 10. 05/2020, follow-up with Dr. Leonie Man, ASPIRE trial was being considered at that time.  However patient later refused.  Apparently, patient was put back on Eliquis by his PCP, however in lower dose with Eliquis 2.5 twice daily.  On exam, patient wife at bedside, patient sitting in bed for dinner, neuro examination no focal deficit, no aphasia, but mild dysarthria, follows simple commands, able to name and repeat.  Moving all extremities.  Sensation symmetrical, finger-nose grossly intact.  Etiology for patient current stroke likely due to A. fib on Eliquis  but being lower dose.  Recommend resume Eliquis 5 mg twice daily.  Continue Zetia 10 and increase Crestor to 20.  PT/OT recommend home PT/OT, stroke risk factor modification, avoid high BP.  For detailed assessment and plan, please refer to above as I have made changes wherever appropriate.   Neurology will sign off. Please call with questions. Pt will follow up with stroke clinic NP at [facility] in about 4 weeks. Thanks for the consult.   Rosalin Hawking, MD PhD Stroke Neurology 12/08/2020 10:35 PM    INTERVAL HISTORY No acute events overnight. Wife at bedside. She has not noticed any facial droop since yesterday. He reports resolution of his aphasia. Patient reports his blood pressure has been elevated in the past couple of weeks. They both report compliance with Eliquis therapy at home. Wife seems to recall Eliquis dose was reduced by a neurologist after hemorrhagic stroke in 2021. We discussed ongoing work up and plan of care and possible need for full dose Eliquis.     Vitals:   12/08/20 0400 12/08/20 0500 12/08/20 0600 12/08/20 0710  BP: (!) 151/108 (!) 154/108 (!) 151/95 (!) 162/103  Pulse: 75 86 71 65  Resp: '11 18 16 18  '$ Temp:      TempSrc:      SpO2: 91% 97% 97% 98%  Weight:      Height:       CBC:  Recent Labs  Lab 12/07/20 1340 12/07/20 1343  WBC  --  4.4  NEUTROABS  --  1.6*  HGB  16.0 15.9  HCT 47.0 48.3  MCV  --  87.0  PLT  --  0000000   Basic Metabolic Panel:  Recent Labs  Lab 12/07/20 1343 12/08/20 0702  NA 137 135  K 3.7 3.9  CL 105 101  CO2 22 25  GLUCOSE 104* 98  BUN 9 9  CREATININE 0.93 0.90  CALCIUM 9.3 9.5  MG  --  2.0   Lipid Panel:  Recent Labs  Lab 12/08/20 0459  CHOL 132  TRIG 39  HDL 26*  CHOLHDL 5.1  VLDL 8  LDLCALC 98   HgbA1c:  Recent Labs  Lab 12/08/20 0459  HGBA1C 6.0*   Urine Drug Screen:  Recent Labs  Lab 12/07/20 1522  LABOPIA NONE DETECTED  COCAINSCRNUR NONE DETECTED  LABBENZ NONE DETECTED  AMPHETMU NONE DETECTED  THCU  NONE DETECTED  LABBARB NONE DETECTED    Alcohol Level  Recent Labs  Lab 12/07/20 1343  ETH <10    IMAGING past 24 hours CT HEAD WO CONTRAST  Result Date: 12/07/2020 CLINICAL DATA:  TIA, aphasia EXAM: CT HEAD WITHOUT CONTRAST TECHNIQUE: Contiguous axial images were obtained from the base of the skull through the vertex without intravenous contrast. COMPARISON:  CT head 05/02/2020, MR head 05/01/2020 FINDINGS: Brain: There is no evidence of acute intracranial hemorrhage, extra-axial fluid collection, or acute infarct. There is global parenchymal volume loss with ex vacuo dilatation of the ventricular system, similar to the prior study. Remote lacunar infarcts in the right frontal lobe and left basal ganglia are similar to the prior study confluent hypodensity throughout the remainder of the subcortical and periventricular white matter is also again seen, nonspecific but likely reflecting sequela of advanced chronic white matter microangiopathy. No mass lesion is seen. There is no midline shift. Vascular: There is dense calcification of the cavernous ICAs and vertebral arteries. A peripherally calcified aneurysm at the vertebrobasilar junction is again noted, suboptimally assessed on this noncontrast study. Skull: Normal. Negative for fracture or focal lesion. Sinuses/Orbits: The paranasal sinuses are clear. There are bilateral lens implants in place. The orbits are otherwise unremarkable. Other: The mastoid air cells are clear. IMPRESSION: 1. No acute intracranial pathology. 2. Unchanged global parenchymal volume loss, chronic white matter microangiopathy, and remote lacunar infarcts. 3. Grossly unchanged peripherally calcified vertebrobasilar artery aneurysm, suboptimally assessed on the current study. Electronically Signed   By: Valetta Mole MD   On: 12/07/2020 15:13   MR ANGIO HEAD WO CONTRAST  Result Date: 12/07/2020 CLINICAL DATA:  Transient ischemic attack.  Slurred speech. EXAM: MRI HEAD WITHOUT  CONTRAST MRA HEAD WITHOUT CONTRAST TECHNIQUE: Multiplanar, multi-echo pulse sequences of the brain and surrounding structures were acquired without intravenous contrast. Angiographic images of the Circle of Willis were acquired using MRA technique without intravenous contrast. COMPARISON:  04/30/2020 CTA head FINDINGS: MRI HEAD FINDINGS Brain: Small area of abnormal diffusion restriction within the right parietal lobe. Chronic microhemorrhage in the left thalamus and brainstem. Hyperintense T2-weighted signal is widespread throughout the white matter. Diffuse, severe atrophy. The midline structures are normal. Old right frontal small vessel infarct. Vascular: Major flow voids are preserved. Skull and upper cervical spine: Normal calvarium and skull base. Visualized upper cervical spine and soft tissues are normal. Sinuses/Orbits:No paranasal sinus fluid levels or advanced mucosal thickening. No mastoid or middle ear effusion. Normal orbits. MRA HEAD FINDINGS POSTERIOR CIRCULATION: --Vertebral arteries: Unchanged 6 mm aneurysm at the vertebrobasilar confluence. --Inferior cerebellar arteries: Normal. --Basilar artery: Normal. --Superior cerebellar arteries: Normal. --Posterior cerebral arteries: Normal.  Both are predominantly supplied by the posterior communicating arteries (p-comm). ANTERIOR CIRCULATION: --Intracranial internal carotid arteries: Normal. --Anterior cerebral arteries (ACA): Normal. --Middle cerebral arteries (MCA): Normal. ANATOMIC VARIANTS: Bilateral fetal origins of the posterior cerebral arteries IMPRESSION: 1. Small area of acute or early subacute ischemia within the right parietal lobe. No hemorrhage or mass effect. 2. 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 3. Diffuse, severe atrophy and chronic small vessel ischemic changes. Electronically Signed   By: Ulyses Jarred M.D.   On: 12/07/2020 21:18   MR BRAIN WO CONTRAST  Result Date: 12/07/2020 CLINICAL DATA:  Transient  ischemic attack.  Slurred speech. EXAM: MRI HEAD WITHOUT CONTRAST MRA HEAD WITHOUT CONTRAST TECHNIQUE: Multiplanar, multi-echo pulse sequences of the brain and surrounding structures were acquired without intravenous contrast. Angiographic images of the Circle of Willis were acquired using MRA technique without intravenous contrast. COMPARISON:  04/30/2020 CTA head FINDINGS: MRI HEAD FINDINGS Brain: Small area of abnormal diffusion restriction within the right parietal lobe. Chronic microhemorrhage in the left thalamus and brainstem. Hyperintense T2-weighted signal is widespread throughout the white matter. Diffuse, severe atrophy. The midline structures are normal. Old right frontal small vessel infarct. Vascular: Major flow voids are preserved. Skull and upper cervical spine: Normal calvarium and skull base. Visualized upper cervical spine and soft tissues are normal. Sinuses/Orbits:No paranasal sinus fluid levels or advanced mucosal thickening. No mastoid or middle ear effusion. Normal orbits. MRA HEAD FINDINGS POSTERIOR CIRCULATION: --Vertebral arteries: Unchanged 6 mm aneurysm at the vertebrobasilar confluence. --Inferior cerebellar arteries: Normal. --Basilar artery: Normal. --Superior cerebellar arteries: Normal. --Posterior cerebral arteries: Normal. Both are predominantly supplied by the posterior communicating arteries (p-comm). ANTERIOR CIRCULATION: --Intracranial internal carotid arteries: Normal. --Anterior cerebral arteries (ACA): Normal. --Middle cerebral arteries (MCA): Normal. ANATOMIC VARIANTS: Bilateral fetal origins of the posterior cerebral arteries IMPRESSION: 1. Small area of acute or early subacute ischemia within the right parietal lobe. No hemorrhage or mass effect. 2. 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 3. Diffuse, severe atrophy and chronic small vessel ischemic changes. Electronically Signed   By: Ulyses Jarred M.D.   On: 12/07/2020 21:18    PHYSICAL  EXAM  General: Laying comfortably in bed; in no acute distress. CV: RRR Resp: Respirations even and unlabored Ext: No edema Skin: Warm and dry   Neurologic Examination  Mental status/Cognition: Alert, oriented to self, place, month and year, good attention. Speech/language: Fluent, comprehension intact, object naming intact, repetition intact. Cranial nerves:   CN II Pupils equal and reactive to light, no VF deficits   CN III,IV,VI EOM intact, no gaze preference or deviation, no nystagmus   CN V normal sensation in V1, V2, and V3 segments bilaterally   CN VII no asymmetry, no nasolabial fold flattening   CN VIII normal hearing to speech   CN IX & X normal palatal elevation, no uvular deviation   CN XI 5/5 head turn and 5/5 shoulder shrug bilaterally   CN XII midline tongue protrusion   Motor: 5/5 bilat UE and LE SILT x 4 extremities  ASSESSMENT/PLAN JAMERE OSHER is a 87 y.o. male with PMH significant for prior ischemic strokes:  Aug 2016 lacunar stroke with good recovery April/May 2020 sudden onset acute left sided weakness while on Eliquis with reported compliance. 1 cm right periventricular white matter infarct found. ASA was added. Residual leg weakness, activity intolerance resulted. Gave up driving.  Dec 2021 Small left thalamic hemorrhage. Eliquis eventually restarted at reduced dose.   Also with history of  atrial fibrillation, hypertension, hyperlipidemia who woke up 8/5 with word finding difficulty and R facial droop (noted by wife). He was brought to the ED by EMS. By the time of his arrival spontaneous resolution of his symptoms had occurred.   R perivnetricular white matter stroke likely due to atrial fibrillation not on full dose Eliquis.  Code Stroke CT head showed no acute abnormality.  MRA/MRI Brain  Small area of acute or early subacute ischemia within the right parietal lobe. No hemorrhage or mass effect.Unchanged 6 mm aneurysm at the vertebrobasilar  confluence. Diffuse, severe atrophy and chronic small vessel ischemic changes. Carotid Doppler  Pending final read  2D Echo EF 55-60%, left atrium mildly dilated, right atrium moderately dilated, No thrombus, wall motion abnormality or shunt found.   LDL 98 HgbA1c 6.0 VTE prophylaxis - On Eliquis    Diet   Diet Heart Room service appropriate? Yes; Fluid consistency: Thin   On Eliquis reduced dose of 2.'5mg'$  BID prior to admission with reported compliance by both patient and wif. Continued at present. Recommend full dose Eliquis '5mg'$  BID. Discussed risk/benefit and need for adequate blood pressure control and close follow up for monitoring both BP and renal function. They will consider and further discuss with Dr. Erlinda Hong.  Therapy recommendations:  Home health PT/OT Disposition:  Home   Hypertension On home Cardizem, Maxide, and Hydralazine, still with elevated diastolic blood pressures [id number]  Permissive hypertension (OK if < 220/120) but gradually normalize in 5-7 days Long-term BP goal normotensive  Hyperlipidemia Home meds:  Crestor '10mg'$ , Zetia '10mg'$  resumed in hospital LDL 98, goal < 70 Recommend increase Crestor dose to '20mg'$  High intensity statin  Continue statin at discharge  Other Stroke Risk Factors Advanced Age >/= 61  Hx of cigarette smoking Obesity, Body mass index is 30.41 kg/m., BMI >/= 30 associated with increased stroke risk, recommend weight loss, diet and exercise as appropriate  Hx stroke  Other Active Problems CRI with hx of AKI   Hospital day # 0 Delila A Bailey-Modzik, NP-C    To contact Stroke Continuity provider, please refer to [email or web address] After hours, contact General Neurology

## 2020-12-08 NOTE — Progress Notes (Signed)
Carotid duplex has been completed.  Results can be found under chart review under CV PROC. 12/08/2020 10:38 AM Jakyiah Briones RVT, RDMS

## 2020-12-09 ENCOUNTER — Encounter (HOSPITAL_COMMUNITY): Payer: Self-pay | Admitting: Internal Medicine

## 2020-12-09 DIAGNOSIS — I63431 Cerebral infarction due to embolism of right posterior cerebral artery: Secondary | ICD-10-CM | POA: Diagnosis not present

## 2020-12-09 DIAGNOSIS — I10 Essential (primary) hypertension: Secondary | ICD-10-CM | POA: Diagnosis not present

## 2020-12-09 LAB — BASIC METABOLIC PANEL
Anion gap: 9 (ref 5–15)
BUN: 9 mg/dL (ref 8–23)
CO2: 28 mmol/L (ref 22–32)
Calcium: 9.9 mg/dL (ref 8.9–10.3)
Chloride: 99 mmol/L (ref 98–111)
Creatinine, Ser: 0.95 mg/dL (ref 0.61–1.24)
GFR, Estimated: >60 mL/min (ref >60)
Glucose, Bld: 100 mg/dL — ABNORMAL HIGH (ref 70–99)
Potassium: 4.3 mmol/L (ref 3.5–5.1)
Sodium: 136 mmol/L (ref 135–145)

## 2020-12-09 LAB — MAGNESIUM: Magnesium: 2 mg/dL (ref 1.7–2.4)

## 2020-12-09 MED ORDER — ROSUVASTATIN CALCIUM 20 MG PO TABS: 20.0000 mg | ORAL_TABLET | Freq: Every day | ORAL | 0 refills | Status: DC

## 2020-12-09 MED ORDER — APIXABAN 5 MG PO TABS: 5.0000 mg | ORAL_TABLET | Freq: Two times a day (BID) | ORAL | 0 refills | Status: DC

## 2020-12-09 NOTE — Care Management Obs Status (Signed)
[place] NOTIFICATION   Patient Details  Name: Steven Meadows MRN: [id number] Date of Birth: 07-Jul-1931   Medicare Observation Status Notification Given:  Yes    Bartholomew Crews, RN 12/09/2020, 8:53 AM

## 2020-12-09 NOTE — Discharge Summary (Signed)
Physician Discharge Summary  Steven Meadows [id number] DOB: 11-10-1931 DOA: 12/07/2020  PCP: Josetta Huddle, MD  Admit date: 12/07/2020 Discharge date: 12/09/2020  Admitted From: Home Disposition: Home  Recommendations for Outpatient Follow-up:  Follow up with PCP in 1-2 weeks Please obtain BMP/CBC in one week your next doctors visit.  Eliquis changed to 5 mg twice daily Crestor increased to 20 mg daily Follow up outpatient Neurology  Home Health: PT/OT rolling walker Equipment/Devices: Discharge Condition: Stable CODE STATUS: Full code Diet recommendation: Heart healthy  Brief/Interim Summary: 87 y.o. male with medical history significant of ischemic and hemorrhagic CVA on low-dose Eliquis, atrial fibrillation, HTN, HLD, rotator cuff tear, arthritis comes to the hospital for evaluation of slurred speech.  Upon admission his symptoms are resolved but MRI brain showed acute CVA, neurology team was consulted.  Patient's Eliquis was increased to 5 mg twice daily and Crestor was increased to 20 mg daily per neurology.  Recommended outpatient follow-up. Son and wife at bedside during my visit prior to discharge.  All the questions answered.   Assessment & Plan:   Active Problems:   Atrial fibrillation (HCC)   Chronic atrial fibrillation (HCC)   Chronic anticoagulation   HLD (hyperlipidemia)   Essential hypertension   Cerebrovascular accident (CVA) due to embolism of right posterior cerebral artery (HCC)   Thalamic hemorrhage (HCC)   Transient ischemic attack   Acute right parietal lobe ischemia Slurred speech, resolved - His symptoms are largely resolved.  CT head is negative.  MRI brain/MRA-shows acute/subacute right parietal lobe ischemia, unchanged 6 mm aneurysm - Neurology consulted -who recommended increasing Eliquis to 5 mg twice daily and Crestor 20 mg twice daily - Carotid ultrasound-near normal - Echocardiogram shows EF 55% - A1c-6.0, LDL 98   Chronic atrial  fibrillation - Eliquis increased to 5 mg twice daily.  Continue rest of home medication   Essential hypertension - On Cardizem; hydralazine, Maxide   Hyperlipidemia - Continue Zetia and and increase Crestor to 20 mg   GERD - PPI      Body mass index is 30.41 kg/m.         Discharge Diagnoses:  Active Problems:   Atrial fibrillation (HCC)   Chronic atrial fibrillation (HCC)   Chronic anticoagulation   HLD (hyperlipidemia)   Essential hypertension   Cerebrovascular accident (CVA) due to embolism of right posterior cerebral artery ([place])   Thalamic hemorrhage ([place])   Transient ischemic attack      Consultations: Neurology  Subjective: Feels back to baseline no complaints.  He sitting up in the chair.  Wife and son at bedside as well.  Discharge Exam: Vitals:   12/09/20 0006 12/09/20 0409  BP: (!) 146/96 137/76  Pulse: 71 69  Resp: 20 18  Temp: 98 F (36.7 C) 97.8 F (36.6 C)  SpO2: 98% 98%   Vitals:   12/08/20 1754 12/08/20 2004 12/09/20 0006 12/09/20 0409  BP: (!) 145/88 125/78 (!) 146/96 137/76  Pulse: 70 74 71 69  Resp: '16 18 20 18  '$ Temp: 97.6 F (36.4 C) 98.5 F (36.9 C) 98 F (36.7 C) 97.8 F (36.6 C)  TempSrc: Oral Oral Oral Oral  SpO2: 98% 92% 98% 98%  Weight:      Height:        General: Pt is alert, awake, not in acute distress Cardiovascular: RRR, S1/S2 +, no rubs, no gallops Respiratory: CTA bilaterally, no wheezing, no rhonchi Abdominal: Soft, NT, ND, bowel sounds + Extremities: no edema, no cyanosis  Discharge Instructions  Discharge Instructions     Ambulatory referral to Neurology   Complete by: As directed    Follow up with stroke clinic NP (Jessica Vanschaick or Cecille Rubin, if both not available, consider Zachery Dauer, or Ahern) at [facility] in about 4 weeks. Thanks.   Call MD for:  difficulty breathing, headache or visual disturbances   Complete by: As directed    Call MD for:  extreme fatigue   Complete by: As  directed    Call MD for:  persistant dizziness or light-headedness   Complete by: As directed    Diet - low sodium heart healthy   Complete by: As directed    Increase activity slowly   Complete by: As directed       Allergies as of 12/09/2020       Reactions   Nabumetone Other (See Comments)   Gi upset   Ace Inhibitors Cough   Flonase [fluticasone Propionate] Other (See Comments)   Causes nosebleeds        Medication List     TAKE these medications    acetaminophen 325 MG tablet Commonly known as: TYLENOL Take 2 tablets (650 mg total) by mouth every 4 (four) hours as needed for mild pain (or temp > 37.5 C (99.5 F)).   apixaban 5 MG Tabs tablet Commonly known as: ELIQUIS Take 1 tablet (5 mg total) by mouth 2 (two) times daily. What changed:  medication strength how much to take   chlorhexidine 0.12 % solution Commonly known as: PERIDEX Use as directed 15 mLs in the mouth or throat See admin instructions. Rinse and spit 15 mls twice daily after meals   diltiazem 240 MG 24 hr capsule Commonly known as: CARDIZEM CD Take 1 capsule (240 mg total) by mouth daily.   ezetimibe 10 MG tablet Commonly known as: ZETIA Take 1 tablet (10 mg total) by mouth daily.   hydrALAZINE 25 MG tablet Commonly known as: APRESOLINE Take 1 tablet (25 mg total) by mouth every 8 (eight) hours.   hydrocortisone 2.5 % cream Apply 1 application topically 2 (two) times daily as needed (rash).   loratadine 10 MG tablet Commonly known as: CLARITIN Take 10 mg by mouth daily as needed for allergies.   pantoprazole 40 MG tablet Commonly known as: PROTONIX Take 40 mg by mouth daily.   rosuvastatin 20 MG tablet Commonly known as: CRESTOR Take 1 tablet (20 mg total) by mouth daily. What changed:  medication strength how much to take   triamterene-hydrochlorothiazide 37.5-25 MG tablet Commonly known as: MAXZIDE-25 Take 1 tablet by mouth daily.       ASK your doctor about these  medications    polyethylene glycol 17 g packet Commonly known as: MIRALAX / GLYCOLAX Take 17 g by mouth 2 (two) times daily.   senna-docusate 8.6-50 MG tablet Commonly known as: Senokot-S Take 1 tablet by mouth 2 (two) times daily.               Durable Medical Equipment  (From admission, onward)           Start     Ordered   12/09/20 0821  DME Gilford Rile  Once       Question Answer Comment  Walker: With 5 Inch Wheels   Patient needs a walker to treat with the following condition Ambulatory dysfunction      12/09/20 0820            Follow-up Information  Garvin Fila, MD. Schedule an appointment as soon as possible for a visit in 4 month(s).   Specialties: Neurology, Radiology Why: stroke clinic Contact information: [place]  [address] [phone]                Allergies  Allergen Reactions   Nabumetone Other (See Comments)    Gi upset   Ace Inhibitors Cough   Flonase [Fluticasone Propionate] Other (See Comments)    Causes nosebleeds    You were cared for by a hospitalist during your hospital stay. If you have any questions about your discharge medications or the care you received while you were in the hospital after you are discharged, you can call the unit and asked to speak with the hospitalist on call if the hospitalist that took care of you is not available. Once you are discharged, your primary care physician will handle any further medical issues. Please note that no refills for any discharge medications will be authorized once you are discharged, as it is imperative that you return to your primary care physician (or establish a relationship with a primary care physician if you do not have one) for your aftercare needs so that they can reassess your need for medications and monitor your lab values.   Procedures/Studies: CT HEAD WO CONTRAST  Result Date: 12/07/2020 CLINICAL DATA:  TIA, aphasia EXAM: CT HEAD  WITHOUT CONTRAST TECHNIQUE: Contiguous axial images were obtained from the base of the skull through the vertex without intravenous contrast. COMPARISON:  CT head 05/02/2020, MR head 05/01/2020 FINDINGS: Brain: There is no evidence of acute intracranial hemorrhage, extra-axial fluid collection, or acute infarct. There is global parenchymal volume loss with ex vacuo dilatation of the ventricular system, similar to the prior study. Remote lacunar infarcts in the right frontal lobe and left basal ganglia are similar to the prior study confluent hypodensity throughout the remainder of the subcortical and periventricular white matter is also again seen, nonspecific but likely reflecting sequela of advanced chronic white matter microangiopathy. No mass lesion is seen. There is no midline shift. Vascular: There is dense calcification of the cavernous ICAs and vertebral arteries. A peripherally calcified aneurysm at the vertebrobasilar junction is again noted, suboptimally assessed on this noncontrast study. Skull: Normal. Negative for fracture or focal lesion. Sinuses/Orbits: The paranasal sinuses are clear. There are bilateral lens implants in place. The orbits are otherwise unremarkable. Other: The mastoid air cells are clear. IMPRESSION: 1. No acute intracranial pathology. 2. Unchanged global parenchymal volume loss, chronic white matter microangiopathy, and remote lacunar infarcts. 3. Grossly unchanged peripherally calcified vertebrobasilar artery aneurysm, suboptimally assessed on the current study. Electronically Signed   By: Valetta Mole MD   On: 12/07/2020 15:13   MR ANGIO HEAD WO CONTRAST  Result Date: 12/07/2020 CLINICAL DATA:  Transient ischemic attack.  Slurred speech. EXAM: MRI HEAD WITHOUT CONTRAST MRA HEAD WITHOUT CONTRAST TECHNIQUE: Multiplanar, multi-echo pulse sequences of the brain and surrounding structures were acquired without intravenous contrast. Angiographic images of the Circle of Willis were  acquired using MRA technique without intravenous contrast. COMPARISON:  04/30/2020 CTA head FINDINGS: MRI HEAD FINDINGS Brain: Small area of abnormal diffusion restriction within the right parietal lobe. Chronic microhemorrhage in the left thalamus and brainstem. Hyperintense T2-weighted signal is widespread throughout the white matter. Diffuse, severe atrophy. The midline structures are normal. Old right frontal small vessel infarct. Vascular: Major flow voids are preserved. Skull and upper cervical spine: Normal calvarium and skull base. Visualized  upper cervical spine and soft tissues are normal. Sinuses/Orbits:No paranasal sinus fluid levels or advanced mucosal thickening. No mastoid or middle ear effusion. Normal orbits. MRA HEAD FINDINGS POSTERIOR CIRCULATION: --Vertebral arteries: Unchanged 6 mm aneurysm at the vertebrobasilar confluence. --Inferior cerebellar arteries: Normal. --Basilar artery: Normal. --Superior cerebellar arteries: Normal. --Posterior cerebral arteries: Normal. Both are predominantly supplied by the posterior communicating arteries (p-comm). ANTERIOR CIRCULATION: --Intracranial internal carotid arteries: Normal. --Anterior cerebral arteries (ACA): Normal. --Middle cerebral arteries (MCA): Normal. ANATOMIC VARIANTS: Bilateral fetal origins of the posterior cerebral arteries IMPRESSION: 1. Small area of acute or early subacute ischemia within the right parietal lobe. No hemorrhage or mass effect. 2. 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 3. Diffuse, severe atrophy and chronic small vessel ischemic changes. Electronically Signed   By: Ulyses Jarred M.D.   On: 12/07/2020 21:18   MR BRAIN WO CONTRAST  Result Date: 12/07/2020 CLINICAL DATA:  Transient ischemic attack.  Slurred speech. EXAM: MRI HEAD WITHOUT CONTRAST MRA HEAD WITHOUT CONTRAST TECHNIQUE: Multiplanar, multi-echo pulse sequences of the brain and surrounding structures were acquired without intravenous  contrast. Angiographic images of the Circle of Willis were acquired using MRA technique without intravenous contrast. COMPARISON:  04/30/2020 CTA head FINDINGS: MRI HEAD FINDINGS Brain: Small area of abnormal diffusion restriction within the right parietal lobe. Chronic microhemorrhage in the left thalamus and brainstem. Hyperintense T2-weighted signal is widespread throughout the white matter. Diffuse, severe atrophy. The midline structures are normal. Old right frontal small vessel infarct. Vascular: Major flow voids are preserved. Skull and upper cervical spine: Normal calvarium and skull base. Visualized upper cervical spine and soft tissues are normal. Sinuses/Orbits:No paranasal sinus fluid levels or advanced mucosal thickening. No mastoid or middle ear effusion. Normal orbits. MRA HEAD FINDINGS POSTERIOR CIRCULATION: --Vertebral arteries: Unchanged 6 mm aneurysm at the vertebrobasilar confluence. --Inferior cerebellar arteries: Normal. --Basilar artery: Normal. --Superior cerebellar arteries: Normal. --Posterior cerebral arteries: Normal. Both are predominantly supplied by the posterior communicating arteries (p-comm). ANTERIOR CIRCULATION: --Intracranial internal carotid arteries: Normal. --Anterior cerebral arteries (ACA): Normal. --Middle cerebral arteries (MCA): Normal. ANATOMIC VARIANTS: Bilateral fetal origins of the posterior cerebral arteries IMPRESSION: 1. Small area of acute or early subacute ischemia within the right parietal lobe. No hemorrhage or mass effect. 2. 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 3. Diffuse, severe atrophy and chronic small vessel ischemic changes. Electronically Signed   By: Ulyses Jarred M.D.   On: 12/07/2020 21:18   ECHOCARDIOGRAM COMPLETE  Result Date: 12/08/2020    ECHOCARDIOGRAM REPORT   Patient Name:   Steven Meadows Date of Exam: 12/08/2020 Medical Rec #:  [id number]      Height:       71.0 in Accession #:    [id number]     Weight:       218.0 lb  Date of Birth:  02-23-1932       BSA:          2.187 m Patient Age:    9 years       BP:           164/97 mmHg Patient Gender: M              HR:           79 bpm. Exam Location:  Inpatient Procedure: 2D Echo, Cardiac Doppler, Color Doppler and Intracardiac            Opacification Agent Indications:    Stroke  History:  Patient has prior history of Echocardiogram examinations, most                 recent 05/01/2020. Arrythmias:Atrial Fibrillation; Risk                 Factors:Hypertension. Hx CVA.  Sonographer:    Clayton Lefort RDCS (AE) Referring Phys: [id number] [facility]  Sonographer Comments: Technically challenging study due to limited acoustic windows, Technically difficult study due to poor echo windows, suboptimal parasternal window, suboptimal apical window, suboptimal subcostal window and patient is morbidly obese.  Image acquisition challenging due to patient body habitus and Image acquisition challenging due to respiratory motion. Patient bed broken and would not stay in lifted position. Echo performed with patient bed in lowest position and sonographer sitting in chair. IMPRESSIONS  1. Frequent ectopy during study.  2. Left ventricular ejection fraction, by estimation, is 55 to 60%. The left ventricle has normal function. The left ventricle has no regional wall motion abnormalities. Left ventricular diastolic parameters are indeterminate.  3. Right ventricular systolic function is normal. The right ventricular size is moderately enlarged. There is normal pulmonary artery systolic pressure. The estimated right ventricular systolic pressure is Q000111Q mmHg.  4. Left atrial size was mildly dilated.  5. Right atrial size was moderately dilated.  6. The mitral valve is grossly normal. Mild mitral valve regurgitation. No evidence of mitral stenosis.  7. The aortic valve is grossly normal. There is mild calcification of the aortic valve. There is mild thickening of the aortic valve. Aortic valve  regurgitation is trivial. Mild aortic valve sclerosis is present, with no evidence of aortic valve stenosis.  8. The inferior vena cava is normal in size with greater than 50% respiratory variability, suggesting right atrial pressure of 3 mmHg. FINDINGS  Left Ventricle: Left ventricular ejection fraction, by estimation, is 55 to 60%. The left ventricle has normal function. The left ventricle has no regional wall motion abnormalities. Definity contrast agent was given IV to delineate the left ventricular  endocardial borders. The left ventricular internal cavity size was normal in size. There is borderline left ventricular hypertrophy. Left ventricular diastolic parameters are indeterminate. Right Ventricle: The right ventricular size is moderately enlarged. Right vetricular wall thickness was not well visualized. Right ventricular systolic function is normal. There is normal pulmonary artery systolic pressure. The tricuspid regurgitant velocity is 2.31 m/s, and with an assumed right atrial pressure of 3 mmHg, the estimated right ventricular systolic pressure is Q000111Q mmHg. Left Atrium: Left atrial size was mildly dilated. Right Atrium: Right atrial size was moderately dilated. Pericardium: Trivial pericardial effusion is present. Presence of pericardial fat pad. Mitral Valve: The mitral valve is grossly normal. Mild mitral annular calcification. Mild mitral valve regurgitation. No evidence of mitral valve stenosis. Tricuspid Valve: The tricuspid valve is grossly normal. Tricuspid valve regurgitation is mild . No evidence of tricuspid stenosis. Aortic Valve: The aortic valve is grossly normal. There is mild calcification of the aortic valve. There is mild thickening of the aortic valve. Aortic valve regurgitation is trivial. Aortic regurgitation PHT measures 866 msec. Mild aortic valve sclerosis is present, with no evidence of aortic valve stenosis. Aortic valve mean gradient measures 2.0 mmHg. Aortic valve peak  gradient measures 4.1 mmHg. Aortic valve area, by VTI measures 1.87 cm. Pulmonic Valve: The pulmonic valve was not well visualized. Pulmonic valve regurgitation is trivial. No evidence of pulmonic stenosis. Aorta: The ascending aorta was not well visualized and the aortic root is normal in size  and structure. Venous: The inferior vena cava was not well visualized. The inferior vena cava is normal in size with greater than 50% respiratory variability, suggesting right atrial pressure of 3 mmHg. IAS/Shunts: No atrial level shunt detected by color flow Doppler.  LEFT VENTRICLE PLAX 2D LVIDd:         5.00 cm LVIDs:         3.50 cm LV PW:         1.10 cm LV IVS:        0.90 cm LVOT diam:     1.90 cm LV SV:         35 LV SV Index:   16 LVOT Area:     2.84 cm  RIGHT VENTRICLE          IVC RV Basal diam:  5.20 cm  IVC diam: 1.80 cm LEFT ATRIUM             Index       RIGHT ATRIUM           Index LA diam:        4.40 cm 2.01 cm/m  RA Area:     28.00 cm LA Vol (A2C):   73.3 ml 33.51 ml/m RA Volume:   103.00 ml 47.09 ml/m LA Vol (A4C):   96.8 ml 44.25 ml/m LA Biplane Vol: 86.3 ml 39.45 ml/m  AORTIC VALVE AV Area (Vmax):    1.97 cm AV Area (Vmean):   1.96 cm AV Area (VTI):     1.87 cm AV Vmax:           101.00 cm/s AV Vmean:          67.800 cm/s AV VTI:            0.190 m AV Peak Grad:      4.1 mmHg AV Mean Grad:      2.0 mmHg LVOT Vmax:         70.00 cm/s LVOT Vmean:        46.900 cm/s LVOT VTI:          0.125 m LVOT/AV VTI ratio: 0.66 AI PHT:            866 msec  AORTA Ao Root diam: 3.90 cm Ao Asc diam:  4.50 cm TRICUSPID VALVE TR Peak grad:   21.3 mmHg TR Vmax:        231.00 cm/s  SHUNTS Systemic VTI:  0.12 m Systemic Diam: 1.90 cm Cherlynn Kaiser MD Electronically signed by Cherlynn Kaiser MD Signature Date/Time: 12/08/2020/3:42:12 PM    Final    VAS US CAROTID  Result Date: 12/08/2020 Carotid Arterial Duplex Study Patient Name:  JOJUAN ALVERSON  Date of Exam:   12/08/2020 Medical Rec #: [id number]       Accession  #:    [id number] Date of Birth: 1931/08/06        Patient Gender: M Patient Age:   23 years Exam Location:  [facility] Procedure:      VAS US CAROTID Referring Phys: [facility] --------------------------------------------------------------------------------  Indications:       CVA. Risk Factors:      Hypertension, hyperlipidemia, no history of smoking, prior                    CVA. Other Factors:     TIAs, Afib. Comparison Study:  CTA 04/30/20 - WNL  Carotid duplex 12/21/14 - BIL 1-39% Performing Technologist: Rogelia Rohrer RVT, RDMS  Examination Guidelines: A complete evaluation includes B-mode imaging, spectral Doppler, color Doppler, and power Doppler as needed of all accessible portions of each vessel. Bilateral testing is considered an integral part of a complete examination. Limited examinations for reoccurring indications may be performed as noted.  Right Carotid Findings: +----------+--------+--------+--------+----------------------+---------+           PSV cm/sEDV cm/sStenosisPlaque Description    Comments  +----------+--------+--------+--------+----------------------+---------+ CCA Prox  63      10                                              +----------+--------+--------+--------+----------------------+---------+ CCA Distal52      9               heterogenous and focal          +----------+--------+--------+--------+----------------------+---------+ ICA Prox  40      10              calcific and smooth   Shadowing +----------+--------+--------+--------+----------------------+---------+ ICA Distal72      14                                              +----------+--------+--------+--------+----------------------+---------+ ECA       106     8                                               +----------+--------+--------+--------+----------------------+---------+ +----------+--------+-------+----------------+-------------------+            PSV cm/sEDV cmsDescribe        Arm Pressure (mmHG) +----------+--------+-------+----------------+-------------------+ [id number]             Multiphasic, WNL                    +----------+--------+-------+----------------+-------------------+ +---------+--------+--+--------+-+---------+ VertebralPSV cm/s29EDV cm/s7Antegrade +---------+--------+--+--------+-+---------+  Left Carotid Findings: +----------+--------+--------+--------+---------------------+------------------+           PSV cm/sEDV cm/sStenosisPlaque Description   Comments           +----------+--------+--------+--------+---------------------+------------------+ CCA Prox  71      13                                   intimal thickening +----------+--------+--------+--------+---------------------+------------------+ CCA Distal74      13                                   intimal thickening +----------+--------+--------+--------+---------------------+------------------+ ICA Prox  39      9               calcific,                                                                 heterogenous and  smooth                                  +----------+--------+--------+--------+---------------------+------------------+ ICA Distal65      17                                                      +----------+--------+--------+--------+---------------------+------------------+ ECA       97      7               focal and            intimal thickening                                   heterogenous                            +----------+--------+--------+--------+---------------------+------------------+ +----------+--------+--------+----------------+-------------------+           PSV cm/sEDV cm/sDescribe        Arm Pressure (mmHG) +----------+--------+--------+----------------+-------------------+ [id number]               Multiphasic, WNL                    +----------+--------+--------+----------------+-------------------+ +---------+--------+--+--------+-+---------+ VertebralPSV cm/s27EDV cm/s7Antegrade +---------+--------+--+--------+-+---------+   Summary: Right Carotid: The extracranial vessels were near-normal with only minimal wall                thickening or plaque. Left Carotid: The extracranial vessels were near-normal with only minimal wall               thickening or plaque. Vertebrals:  Bilateral vertebral arteries demonstrate antegrade flow. Subclavians: Normal flow hemodynamics were seen in bilateral subclavian              arteries. *See table(s) above for measurements and observations.     Preliminary      The results of significant diagnostics from this hospitalization (including imaging, microbiology, ancillary and laboratory) are listed below for reference.     Microbiology: Recent Results (from the past 240 hour(s))  Resp Panel by RT-PCR (Flu A&B, Covid) Nasopharyngeal Swab     Status: None   Collection Time: 12/07/20  1:45 PM   Specimen: Nasopharyngeal Swab; Nasopharyngeal(NP) swabs in vial transport medium  Result Value Ref Range Status   SARS Coronavirus 2 by RT PCR NEGATIVE NEGATIVE Final    Comment: (NOTE) SARS-CoV-2 target nucleic acids are NOT DETECTED.  The SARS-CoV-2 RNA is generally detectable in upper respiratory specimens during the acute phase of infection. The lowest concentration of SARS-CoV-2 viral copies this assay can detect is 138 copies/mL. A negative result does not preclude SARS-Cov-2 infection and should not be used as the sole basis for treatment or other patient management decisions. A negative result may occur with  improper specimen collection/handling, submission of specimen other than nasopharyngeal swab, presence of viral mutation(s) within the areas targeted by this assay, and inadequate number of viral copies(<138 copies/mL). A negative result  must be combined with clinical observations, patient history, and epidemiological information. The expected result is Negative.  Fact Sheet for Patients:  EntrepreneurPulse.com.au  Fact Sheet for Healthcare Providers:  IncredibleEmployment.be  This  test is no t yet approved or cleared by the [place] and  has been authorized for detection and/or diagnosis of SARS-CoV-2 by FDA under an Emergency Use Authorization (EUA). This EUA will remain  in effect (meaning this test can be used) for the duration of the COVID-19 declaration under Section 564(b)(1) of the Act, 21 U.S.C.section 360bbb-3(b)(1), unless the authorization is terminated  or revoked sooner.       Influenza A by PCR NEGATIVE NEGATIVE Final   Influenza B by PCR NEGATIVE NEGATIVE Final    Comment: (NOTE) The Xpert Xpress SARS-CoV-2/FLU/RSV plus assay is intended as an aid in the diagnosis of influenza from Nasopharyngeal swab specimens and should not be used as a sole basis for treatment. Nasal washings and aspirates are unacceptable for Xpert Xpress SARS-CoV-2/FLU/RSV testing.  Fact Sheet for Patients: EntrepreneurPulse.com.au  Fact Sheet for Healthcare Providers: IncredibleEmployment.be  This test is not yet approved or cleared by the [place] FDA and has been authorized for detection and/or diagnosis of SARS-CoV-2 by FDA under an Emergency Use Authorization (EUA). This EUA will remain in effect (meaning this test can be used) for the duration of the COVID-19 declaration under Section 564(b)(1) of the Act, 21 U.S.C. section 360bbb-3(b)(1), unless the authorization is terminated or revoked.  Performed at [facility], [place] [address] [place], [place] [address]      Labs: BNP (last 3 results) No results for input(s): BNP in the last 8760 hours. Basic Metabolic Panel: Recent Labs  Lab 12/07/20 1340 12/07/20 1343  12/08/20 0702 12/09/20 0029  NA 138 137 135 136  K 3.8 3.7 3.9 4.3  CL 104 105 101 99  CO2  --  '22 25 28  '$ GLUCOSE 104* 104* 98 100*  BUN '11 9 9 9  '$ CREATININE 0.80 0.93 0.90 0.95  CALCIUM  --  9.3 9.5 9.9  MG  --   --  2.0 2.0   Liver Function Tests: Recent Labs  Lab 12/07/20 1343  AST 21  ALT 24  ALKPHOS 25*  BILITOT 1.6*  PROT 6.5  ALBUMIN 3.9   No results for input(s): LIPASE, AMYLASE in the last 168 hours. No results for input(s): AMMONIA in the last 168 hours. CBC: Recent Labs  Lab 12/07/20 1340 12/07/20 1343  WBC  --  4.4  NEUTROABS  --  1.6*  HGB 16.0 15.9  HCT 47.0 48.3  MCV  --  87.0  PLT  --  178   Cardiac Enzymes: No results for input(s): CKTOTAL, CKMB, CKMBINDEX, TROPONINI in the last 168 hours. BNP: Invalid input(s): POCBNP CBG: No results for input(s): GLUCAP in the last 168 hours. D-Dimer No results for input(s): DDIMER in the last 72 hours. Hgb A1c Recent Labs    12/08/20 0459  HGBA1C 6.0*   Lipid Profile Recent Labs    12/08/20 0459  CHOL 132  HDL 26*  LDLCALC 98  TRIG 39  CHOLHDL 5.1   Thyroid function studies No results for input(s): TSH, T4TOTAL, T3FREE, THYROIDAB in the last 72 hours.  Invalid input(s): FREET3 Anemia work up No results for input(s): VITAMINB12, FOLATE, FERRITIN, TIBC, IRON, RETICCTPCT in the last 72 hours. Urinalysis    Component Value Date/Time   COLORURINE STRAW (A) 12/07/2020 1522   APPEARANCEUR CLEAR 12/07/2020 1522   LABSPEC 1.005 12/07/2020 1522   PHURINE 7.0 12/07/2020 1522   GLUCOSEU NEGATIVE 12/07/2020 1522   HGBUR NEGATIVE 12/07/2020 [place] 12/07/2020 1522   KETONESUR NEGATIVE  12/07/2020 [place] 12/07/2020 1522   UROBILINOGEN 0.2 01/05/2009 0535   NITRITE NEGATIVE 12/07/2020 1522   LEUKOCYTESUR NEGATIVE 12/07/2020 1522   Sepsis Labs Invalid input(s): PROCALCITONIN,  WBC,  LACTICIDVEN Microbiology Recent Results (from the past 240 hour(s))  Resp  Panel by RT-PCR (Flu A&B, Covid) Nasopharyngeal Swab     Status: None   Collection Time: 12/07/20  1:45 PM   Specimen: Nasopharyngeal Swab; Nasopharyngeal(NP) swabs in vial transport medium  Result Value Ref Range Status   SARS Coronavirus 2 by RT PCR NEGATIVE NEGATIVE Final    Comment: (NOTE) SARS-CoV-2 target nucleic acids are NOT DETECTED.  The SARS-CoV-2 RNA is generally detectable in upper respiratory specimens during the acute phase of infection. The lowest concentration of SARS-CoV-2 viral copies this assay can detect is 138 copies/mL. A negative result does not preclude SARS-Cov-2 infection and should not be used as the sole basis for treatment or other patient management decisions. A negative result may occur with  improper specimen collection/handling, submission of specimen other than nasopharyngeal swab, presence of viral mutation(s) within the areas targeted by this assay, and inadequate number of viral copies(<138 copies/mL). A negative result must be combined with clinical observations, patient history, and epidemiological information. The expected result is Negative.  Fact Sheet for Patients:  EntrepreneurPulse.com.au  Fact Sheet for Healthcare Providers:  IncredibleEmployment.be  This test is no t yet approved or cleared by the [place] FDA and  has been authorized for detection and/or diagnosis of SARS-CoV-2 by FDA under an Emergency Use Authorization (EUA). This EUA will remain  in effect (meaning this test can be used) for the duration of the COVID-19 declaration under Section 564(b)(1) of the Act, 21 U.S.C.section 360bbb-3(b)(1), unless the authorization is terminated  or revoked sooner.       Influenza A by PCR NEGATIVE NEGATIVE Final   Influenza B by PCR NEGATIVE NEGATIVE Final    Comment: (NOTE) The Xpert Xpress SARS-CoV-2/FLU/RSV plus assay is intended as an aid in the diagnosis of influenza from Nasopharyngeal  swab specimens and should not be used as a sole basis for treatment. Nasal washings and aspirates are unacceptable for Xpert Xpress SARS-CoV-2/FLU/RSV testing.  Fact Sheet for Patients: EntrepreneurPulse.com.au  Fact Sheet for Healthcare Providers: IncredibleEmployment.be  This test is not yet approved or cleared by the [place] FDA and has been authorized for detection and/or diagnosis of SARS-CoV-2 by FDA under an Emergency Use Authorization (EUA). This EUA will remain in effect (meaning this test can be used) for the duration of the COVID-19 declaration under Section 564(b)(1) of the Act, 21 U.S.C. section 360bbb-3(b)(1), unless the authorization is terminated or revoked.  Performed at [facility], [place] [address], [place], [place] [address]      Time coordinating discharge:  I have spent 35 minutes face to face with the patient and on the ward discussing the patients care, assessment, plan and disposition with other care givers. >50% of the time was devoted counseling the patient about the risks and benefits of treatment/Discharge disposition and coordinating care.   SIGNED:   Damita Lack, MD  [facility] 12/09/2020, 8:21 AM   If 7PM-7AM, please contact night-coverage

## 2020-12-09 NOTE — Progress Notes (Signed)
Discharge instructions reviewed and given to pt wife and son  acknowledge understanding.

## 2020-12-09 NOTE — Progress Notes (Signed)
PT Cancellation Note  Patient Details Name: Steven Meadows MRN: [id number] DOB: Jun 17, 1931   Cancelled Treatment:    Reason Eval/Treat Not Completed: Other (comment) appears to have discharged- thank you for the opportunity to participate in his care!   Windell Norfolk, DPT, PN2   Supplemental Physical Therapist [place]    Pager [phone] Acute Rehab Office [phone]

## 2020-12-17 DIAGNOSIS — L814 Other melanin hyperpigmentation: Secondary | ICD-10-CM | POA: Diagnosis not present

## 2020-12-17 DIAGNOSIS — D225 Melanocytic nevi of trunk: Secondary | ICD-10-CM | POA: Diagnosis not present

## 2020-12-17 DIAGNOSIS — L821 Other seborrheic keratosis: Secondary | ICD-10-CM | POA: Diagnosis not present

## 2020-12-17 DIAGNOSIS — D2262 Melanocytic nevi of left upper limb, including shoulder: Secondary | ICD-10-CM | POA: Diagnosis not present

## 2020-12-17 DIAGNOSIS — L570 Actinic keratosis: Secondary | ICD-10-CM | POA: Diagnosis not present

## 2020-12-17 DIAGNOSIS — Z85828 Personal history of other malignant neoplasm of skin: Secondary | ICD-10-CM | POA: Diagnosis not present

## 2020-12-17 DIAGNOSIS — D2261 Melanocytic nevi of right upper limb, including shoulder: Secondary | ICD-10-CM | POA: Diagnosis not present

## 2020-12-17 DIAGNOSIS — L820 Inflamed seborrheic keratosis: Secondary | ICD-10-CM | POA: Diagnosis not present

## 2021-03-04 DIAGNOSIS — Z23 Encounter for immunization: Secondary | ICD-10-CM | POA: Diagnosis not present

## 2021-03-20 ENCOUNTER — Inpatient Hospital Stay: Payer: Medicare Other | Admitting: Neurology

## 2021-03-20 ENCOUNTER — Encounter: Payer: Self-pay | Admitting: Neurology

## 2021-03-21 DIAGNOSIS — E7800 Pure hypercholesterolemia, unspecified: Secondary | ICD-10-CM | POA: Diagnosis not present

## 2021-03-21 DIAGNOSIS — I4891 Unspecified atrial fibrillation: Secondary | ICD-10-CM | POA: Diagnosis not present

## 2021-03-21 DIAGNOSIS — G459 Transient cerebral ischemic attack, unspecified: Secondary | ICD-10-CM | POA: Diagnosis not present

## 2021-03-21 DIAGNOSIS — I639 Cerebral infarction, unspecified: Secondary | ICD-10-CM | POA: Diagnosis not present

## 2021-03-21 DIAGNOSIS — I619 Nontraumatic intracerebral hemorrhage, unspecified: Secondary | ICD-10-CM | POA: Diagnosis not present

## 2021-03-21 DIAGNOSIS — I10 Essential (primary) hypertension: Secondary | ICD-10-CM | POA: Diagnosis not present

## 2021-03-21 DIAGNOSIS — M179 Osteoarthritis of knee, unspecified: Secondary | ICD-10-CM | POA: Diagnosis not present

## 2021-03-21 DIAGNOSIS — K219 Gastro-esophageal reflux disease without esophagitis: Secondary | ICD-10-CM | POA: Diagnosis not present

## 2021-04-15 DIAGNOSIS — G459 Transient cerebral ischemic attack, unspecified: Secondary | ICD-10-CM | POA: Diagnosis not present

## 2021-04-15 DIAGNOSIS — I619 Nontraumatic intracerebral hemorrhage, unspecified: Secondary | ICD-10-CM | POA: Diagnosis not present

## 2021-04-15 DIAGNOSIS — I639 Cerebral infarction, unspecified: Secondary | ICD-10-CM | POA: Diagnosis not present

## 2021-04-15 DIAGNOSIS — K219 Gastro-esophageal reflux disease without esophagitis: Secondary | ICD-10-CM | POA: Diagnosis not present

## 2021-04-15 DIAGNOSIS — M179 Osteoarthritis of knee, unspecified: Secondary | ICD-10-CM | POA: Diagnosis not present

## 2021-04-15 DIAGNOSIS — E7800 Pure hypercholesterolemia, unspecified: Secondary | ICD-10-CM | POA: Diagnosis not present

## 2021-04-15 DIAGNOSIS — I10 Essential (primary) hypertension: Secondary | ICD-10-CM | POA: Diagnosis not present

## 2021-04-15 DIAGNOSIS — I4891 Unspecified atrial fibrillation: Secondary | ICD-10-CM | POA: Diagnosis not present

## 2021-05-27 DIAGNOSIS — R5383 Other fatigue: Secondary | ICD-10-CM | POA: Diagnosis not present

## 2021-05-27 DIAGNOSIS — E7800 Pure hypercholesterolemia, unspecified: Secondary | ICD-10-CM | POA: Diagnosis not present

## 2021-05-27 DIAGNOSIS — Z1389 Encounter for screening for other disorder: Secondary | ICD-10-CM | POA: Diagnosis not present

## 2021-05-27 DIAGNOSIS — Z0000 Encounter for general adult medical examination without abnormal findings: Secondary | ICD-10-CM | POA: Diagnosis not present

## 2021-05-27 DIAGNOSIS — I4891 Unspecified atrial fibrillation: Secondary | ICD-10-CM | POA: Diagnosis not present

## 2021-05-27 DIAGNOSIS — D6869 Other thrombophilia: Secondary | ICD-10-CM | POA: Diagnosis not present

## 2021-05-27 DIAGNOSIS — E559 Vitamin D deficiency, unspecified: Secondary | ICD-10-CM | POA: Diagnosis not present

## 2021-05-27 DIAGNOSIS — K219 Gastro-esophageal reflux disease without esophagitis: Secondary | ICD-10-CM | POA: Diagnosis not present

## 2021-05-27 DIAGNOSIS — M179 Osteoarthritis of knee, unspecified: Secondary | ICD-10-CM | POA: Diagnosis not present

## 2021-05-27 DIAGNOSIS — I6930 Unspecified sequelae of cerebral infarction: Secondary | ICD-10-CM | POA: Diagnosis not present

## 2021-05-27 DIAGNOSIS — I10 Essential (primary) hypertension: Secondary | ICD-10-CM | POA: Diagnosis not present

## 2021-06-10 ENCOUNTER — Ambulatory Visit (INDEPENDENT_AMBULATORY_CARE_PROVIDER_SITE_OTHER): Payer: Medicare Other | Admitting: Neurology

## 2021-06-10 VITALS — BP 168/101 | HR 118 | Ht 71.0 in | Wt 218.0 lb

## 2021-06-10 DIAGNOSIS — Z8673 Personal history of transient ischemic attack (TIA), and cerebral infarction without residual deficits: Secondary | ICD-10-CM | POA: Diagnosis not present

## 2021-06-10 DIAGNOSIS — I482 Chronic atrial fibrillation, unspecified: Secondary | ICD-10-CM

## 2021-06-10 DIAGNOSIS — G609 Hereditary and idiopathic neuropathy, unspecified: Secondary | ICD-10-CM | POA: Diagnosis not present

## 2021-06-10 DIAGNOSIS — I6381 Other cerebral infarction due to occlusion or stenosis of small artery: Secondary | ICD-10-CM | POA: Diagnosis not present

## 2021-06-10 NOTE — Patient Instructions (Signed)
I had a long discussion with the patient and his wife regarding his small left subcortical infarct while on suboptimal anticoagulation with Eliquis and prior history of thalamic hemorrhage while on full dose Eliquis and discussed treatment conundrum and due to significant risk of recurrent stroke I recommend he continue Eliquis full dose 5 mg twice daily and maintain aggressive risk factor modification with strict control of hypertension with blood pressure goal below 30/90 and lipids with LDL cholesterol goal below 70 mg percent.  He was counseled to use his wheeled walker at all times and we discussed fall safety precautions.  He will return for follow-up in the future in 1 year or call earlier if necessary. Fall Prevention in the Home, Adult Falls can cause injuries and can happen to people of all ages. There are many things you can do to make your home safe and to help prevent falls. Ask for help when making these changes. What actions can I take to prevent falls? General Instructions Use good lighting in all rooms. Replace any light bulbs that burn out. Turn on the lights in dark areas. Use night-lights. Keep items that you use often in easy-to-reach places. Lower the shelves around your home if needed. Set up your furniture so you have a clear path. Avoid moving your furniture around. Do not have throw rugs or other things on the floor that can make you trip. Avoid walking on wet floors. If any of your floors are uneven, fix them. Add color or contrast paint or tape to clearly mark and help you see: Grab bars or handrails. First and last steps of staircases. Where the edge of each step is. If you use a stepladder: Make sure that it is fully opened. Do not climb a closed stepladder. Make sure the sides of the stepladder are locked in place. Ask someone to hold the stepladder while you use it. Know where your pets are when moving through your home. What can I do in the bathroom?   Keep the  floor dry. Clean up any water on the floor right away. Remove soap buildup in the tub or shower. Use nonskid mats or decals on the floor of the tub or shower. Attach bath mats securely with double-sided, nonslip rug tape. If you need to sit down in the shower, use a plastic, nonslip stool. Install grab bars by the toilet and in the tub and shower. Do not use towel bars as grab bars. What can I do in the bedroom? Make sure that you have a light by your bed that is easy to reach. Do not use any sheets or blankets for your bed that hang to the floor. Have a firm chair with side arms that you can use for support when you get dressed. What can I do in the kitchen? Clean up any spills right away. If you need to reach something above you, use a step stool with a grab bar. Keep electrical cords out of the way. Do not use floor polish or wax that makes floors slippery. What can I do with my stairs? Do not leave any items on the stairs. Make sure that you have a light switch at the top and the bottom of the stairs. Make sure that there are handrails on both sides of the stairs. Fix handrails that are broken or loose. Install nonslip stair treads on all your stairs. Avoid having throw rugs at the top or bottom of the stairs. Choose a carpet that does not  hide the edge of the steps on the stairs. Check carpeting to make sure that it is firmly attached to the stairs. Fix carpet that is loose or worn. What can I do on the outside of my home? Use bright outdoor lighting. Fix the edges of walkways and driveways and fix any cracks. Remove anything that might make you trip as you walk through a door, such as a raised step or threshold. Trim any bushes or trees on paths to your home. Check to see if handrails are loose or broken and that both sides of all steps have handrails. Install guardrails along the edges of any raised decks and porches. Clear paths of anything that can make you trip, such as tools  or rocks. Have leaves, snow, or ice cleared regularly. Use sand or salt on paths during winter. Clean up any spills in your garage right away. This includes grease or oil spills. What other actions can I take? Wear shoes that: Have a low heel. Do not wear high heels. Have rubber bottoms. Feel good on your feet and fit well. Are closed at the toe. Do not wear open-toe sandals. Use tools that help you move around if needed. These include: Canes. Walkers. Scooters. Crutches. Review your medicines with your doctor. Some medicines can make you feel dizzy. This can increase your chance of falling. Ask your doctor what else you can do to help prevent falls. Where to find more information Centers for Disease Control and Prevention, [employer]: http://www.wolf.info/ National Institute on Aging: http://kim-miller.com/ Contact a doctor if: You are afraid of falling at home. You feel weak, drowsy, or dizzy at home. You fall at home. Summary There are many simple things that you can do to make your home safe and to help prevent falls. Ways to make your home safe include removing things that can make you trip and installing grab bars in the bathroom. Ask for help when making these changes in your home. This information is not intended to replace advice given to you by your health care provider. Make sure you discuss any questions you have with your health care provider. Document Revised: 11/23/2019 Document Reviewed: 11/23/2019 Elsevier Patient Education  [place].

## 2021-06-10 NOTE — Progress Notes (Signed)
[place]  [facility] [place]. [place] [address] [phone]       OFFICE FOLLOW-UP NOTE  Mr. FOCH ROSENWALD Date of Birth:  [birth year] Medical Record Number:  [id number]   HPI: Virtual video visit 09/23/2018 : Steven Meadows is a 88 year old pleasant Caucasian male seen today for initial virtual video consultation for stroke.  History is obtained from the patient and review of electronic medical records.  I have personally reviewed imaging films in PACS.  He developed sudden onset of acute weakness in his legs left greater than right on 09/02/2018.  He had trouble getting up from a chair and needed to hold onto things.  He went to sleep but the next day when he woke up he fell down and noticed left arm weakness as well in his left leg weakness.  CT scan of the head in the emergency room showed no acute findings.  MRI scan was obtained which showed 1 cm right periventricular white matter infarct.  Patient had history of atrial fibrillation and was on Eliquis and had been compliant with his medication.  CT angiogram showed no emergent large vessel stenosis or occlusion.  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  Transthoracic echo showed normal ejection fraction.  Cardiac monitoring showed atrial fibrillation.  LDL cholesterol was 54 mg percent.  Hemoglobin A1c was 6.0.  Patient was discharged home and aspirin was added.  Patient states is done well since discharge.  His left-sided strength has improved.  He does use a walker to walk outdoors but can walk short distances by himself.  He is feels his balance is not completely recovered particularly when he gets up suddenly or makes quick movement.  He is currently getting home physical and occupational therapy.  His blood pressure is well controlled and today it is 135/75.  He has no new complaints.  He does have a past neurological history of right  splenium white matter lacunar infarct in August 2016 and had seen Dr. Erlinda Hong at that time.  He had made good recovery from that  Past medical history atrial fibrillation aneurysm of vertebral artery, hypertension, hyperlipidemia, lacunar stroke, low back pain, rotator cuff tear. Past surgical history cataract extraction right eye, tonsillectomy, adenoidectomy, Family history heart attack in grand parents. Social history has never smoked uses alcohol socially. Medication allergy Flonase and nabumetone  Update 01/26/2019 : He returns for follow-up today after virtual video visit 4 months ago.  He states he continues to have mild gait difficulties and imbalance.  He has had no falls or injuries.  He has been working in the yard but states his stamina is not good.  He can barely walk for about 10 minutes and then asked to rest for a few minutes.  He is not consistent with using a cane and uses mostly outdoors occasionally.  Patient is feels that he is had weakness as well as lack of sensation in his feet for the last 7 months or so.  In fact on couple of occasions while driving his foot slipped off the gas pedal accidentally.  He has in fact given up driving now.  He has not been evaluated for neuropathy.  He remains on Eliquis which is tolerating well without significant bleeding and only minor bruising.  He has discontinued aspirin.  His blood pressure is quite well controlled at home though today it is borderline in office  at 146/101.  He remains on Crestor which is tolerating well without muscle aches and pains.  He denies significant decrease in appetite or weight loss.  Does not drink excessive amounts of alcohol and does not have diabetes.  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 Update 05/30/2020 : Patient seen for follow-up today after last visit more than a year ago.  He was hospitalized at [facility] on 04/30/2020 with sudden onset of aphasia and confusion.  He had garbled speech on  admission.  No lateralized weakness.  CT scan showed a small left thalamic hyperdensity with aspect score of 10.  CT angiogram showed stable appearance of the 6 mm right vertebral basilar junction aneurysm.  MRI scan showed small left thalamic and pontine late subacute hemorrhage.  2D echo showed normal ejection fraction.  LDL cholesterol is 31 mg percent.  Hemoglobin A1c was 6.0.  Patient was on Eliquis prior to admission which was reversed with Andexxa and all antithrombotics and anticoagulants were stopped.  Patient was transferred to inpatient rehab is currently living at home.  Is still getting home physical therapy though occupational and speech therapy have finished.  He was previously walking with a cane and now is walking with a wheeled walker.  Can use a cane indoors but uses walker most of the time.  His blood pressures well controlled at home in the 010 systolic range this morning he missed taking his medicine and it is elevated in office at 169/103.  He has no other complaints.  He has questions about going back on aspirin versus Eliquis and after discussion is willing to consider possible participation in the  [place] study Update 06/10/2021 : Patient is seen for follow-up after last visit a year ago.  Is accompanied by his wife.  Patient decided not to participate in the ASPIRE study but primary care physician put him on low-dose Eliquis 2.5 twice daily and he was doing all right till 12/07/20 when he developed facial droop and speech difficulties.  CT head was not negative but MRI scan showed a small right periatrial periventricular infarct.  Carotid ultrasound showed no significant extracranial stenosis.  Echocardiogram showed normal ejection fraction of 55 to 60%.  LDL cholesterol 98 mg percent.  Hemoglobin A1c was 6.0.  MR angiogram of the brain showed stable 6 mm vertebrobasilar junction aneurysm.  Patient's Crestor dose was increased to 20 mg daily and Eliquis was increased to full dose 5 mg twice  daily.  He is done well since then without recurrent TIA or stroke symptoms.  He continues to have minor bruising but no bleeding episodes.  Continues to have some gait and balance difficulties and numbness in his feet from his neuropathy.  He does use a wheeled walker at all times and is very careful and fortunately has had no falls or injuries.  He saw his primary care physician Dr. Inda Merlin recently who added Zoloft 25 mg daily for the last 1 week.  14 system review of systems is positive for  Ataxia, imbalance, gait difficulties, tiredness, decrease stamina and all other systems negative.  PMH:  Past Medical History:  Diagnosis Date   Abdominal aortic atherosclerosis (HCC)    Actinic keratoses    Aneurysm of vertebral artery (HCC)    Atrial fibrillation (HCC)    Cough    History of mononucleosis    HTN (hypertension)    Hypercholesteremia    Lacunar stroke (HCC)    Low back pain  Olecranon bursitis    Rotator cuff tear    Stroke [facility])     Social History:  Social History   Socioeconomic History   Marital status: Married    Spouse name: Benjamine Mola   Number of children: Not on file   Years of education: Not on file   Highest education level: Not on file  Occupational History   Not on file  Tobacco Use   Smoking status: Never   Smokeless tobacco: Never  Vaping Use   Vaping Use: Never used  Substance and Sexual Activity   Alcohol use: Yes    Alcohol/week: 0.0 standard drinks    Comment: 2 drinks per night   Drug use: No   Sexual activity: Not on file  Other Topics Concern   Not on file  Social History Narrative   Lives with wife   Right Handed   Drinks 2-3 cups caffeine daily   Social Determinants of Health   Financial Resource Strain: Not on file  Food Insecurity: Not on file  Transportation Needs: Not on file  Physical Activity: Not on file  Stress: Not on file  Social Connections: Not on file  Intimate Partner Violence: Not on file    Medications:    Current Outpatient Medications on File Prior to Visit  Medication Sig Dispense Refill   acetaminophen (TYLENOL) 325 MG tablet Take 2 tablets (650 mg total) by mouth every 4 (four) hours as needed for mild pain (or temp > 37.5 C (99.5 F)).     apixaban (ELIQUIS) 5 MG TABS tablet Take 1 tablet (5 mg total) by mouth 2 (two) times daily. 60 tablet 0   chlorhexidine (PERIDEX) 0.12 % solution Use as directed 15 mLs in the mouth or throat See admin instructions. Rinse and spit 15 mls twice daily after meals     diltiazem (CARDIZEM CD) 240 MG 24 hr capsule Take 1 capsule (240 mg total) by mouth daily. 30 capsule 0   ezetimibe (ZETIA) 10 MG tablet Take 1 tablet (10 mg total) by mouth daily. 30 tablet 0   hydrALAZINE (APRESOLINE) 25 MG tablet Take 1 tablet (25 mg total) by mouth every 8 (eight) hours. 90 tablet 0   hydrocortisone 2.5 % cream Apply 1 application topically 2 (two) times daily as needed (rash).     pantoprazole (PROTONIX) 40 MG tablet Take 40 mg by mouth daily.     polyethylene glycol (MIRALAX / GLYCOLAX) 17 g packet Take 17 g by mouth 2 (two) times daily. 14 each 0   rosuvastatin (CRESTOR) 20 MG tablet Take 1 tablet (20 mg total) by mouth daily. 90 tablet 0   sertraline (ZOLOFT) 25 MG tablet 1 tablet     triamterene-hydrochlorothiazide (MAXZIDE-25) 37.5-25 MG tablet Take 1 tablet by mouth daily. 30 tablet 0   No current facility-administered medications on file prior to visit.    Allergies:   Allergies  Allergen Reactions   Nabumetone Other (See Comments)    Gi upset   Ace Inhibitors Cough   Flonase [Fluticasone Propionate] Other (See Comments)    Causes nosebleeds    Physical Exam General: well developed, well nourished, elderly Caucasian male seated, in no evident distress Head: head normocephalic and atraumatic.  Neck: supple with no carotid or supraclavicular bruits Cardiovascular: regular rate and rhythm, no murmurs Musculoskeletal: no deformity Skin:  no  rash/petichiae Vascular:  Normal pulses all extremities Vitals:   06/10/21 1130  BP: (!) 168/101  Pulse: (!) 118   Neurologic Exam Mental  Status: Awake and fully alert. Oriented to place and time. Recent and remote memory intact. Attention span, concentration and fund of knowledge appropriate. Mood and affect appropriate.  Cranial Nerves: Fundoscopic exam not done pupils equal, briskly reactive to light. Extraocular movements full without nystagmus. Visual fields full to confrontation. Hearing diminished bilaterally.. Facial sensation intact. Face, tongue, palate moves normally and symmetrically.  Motor: Normal bulk and tone. Normal strength in all tested extremity muscles except mild weakness of ankle dorsiflexors right greater than left..  Diminished fine finger movements on the left.  Orbits right over left upper extremity. Sensory.: intact to touch ,pinprick sensation but diminished.position and vibratory sensation from ankle down bilaterally.  Positive Romberg sign Coordination: Rapid alternating movements normal in all extremities. Finger-to-nose and heel-to-shin performed accurately bilaterally. Gait and Station: Arises from chair with slight difficulty. Stance is slightly broad-based and stooped.  Uses a wheeled walker with slightly ataxic gait.  Not able to heel, toe and tandem walk without difficulty.  Reflexes: 1+ and symmetric. Toes downgoing.   NIHSS  0 Modified Rankin  2   ASSESSMENT: 88 year old Caucasian male with right subcortical lacunar infarct in May 2020 likely from small vessel disease even though he has atrial fibrillation and was compliant with his anticoagulation with Eliquis.  Past history of right subcortical lacunar infarct in August 2016.  Multiple vascular risk factors of atrial fibrillation, hypertension, hyperlipidemia, prior stroke.  He also has incidental 6 x 8 mm veretebrobasilar junction aneurysm aneurysm which stable from 2016. Marland Kitchen Recent admission in December  2021 due to the thalamic hemorrhage related to anticoagulation with Eliquis following which she declined participation in the aspire study but was started on low-dose Eliquis 2.5 twice daily but was readmitted on 12/07/2020 with transient right facial droop and aphasia due to new small right periatrial infarct.     PLAN: I had a long discussion with the patient and his wife regarding his small left subcortical infarct while on suboptimal anticoagulation with Eliquis and prior history of thalamic hemorrhage while on full dose Eliquis and discussed treatment conundrum and due to significant risk of recurrent stroke I recommend he continue Eliquis full dose 5 mg twice daily and maintain aggressive risk factor modification with strict control of hypertension with blood pressure goal below 30/90 and lipids with LDL cholesterol goal below 70 mg percent.  He was counseled to use his wheeled walker at all times and we discussed fall safety precautions.  He will return for follow-up in the future in 1 year or call earlier if necessary. Greater than 50% of time during this 35 minute visit was spent on counseling,explanation of diagnosis of embolic stroke, atrial fibrillation, neuropathy,, planning of further management, discussion with patient and family and coordination of care Antony Contras, MD  [facility] [address] [place], [place] [address]  Phone [phone] Fax [phone] Note: This document was prepared with digital dictation and possible smart phrase technology. Any transcriptional errors that result from this process are unintentional

## 2021-06-19 DIAGNOSIS — Z85828 Personal history of other malignant neoplasm of skin: Secondary | ICD-10-CM | POA: Diagnosis not present

## 2021-06-19 DIAGNOSIS — L570 Actinic keratosis: Secondary | ICD-10-CM | POA: Diagnosis not present

## 2021-06-19 DIAGNOSIS — L812 Freckles: Secondary | ICD-10-CM | POA: Diagnosis not present

## 2021-06-19 DIAGNOSIS — L821 Other seborrheic keratosis: Secondary | ICD-10-CM | POA: Diagnosis not present

## 2021-07-23 DIAGNOSIS — I10 Essential (primary) hypertension: Secondary | ICD-10-CM | POA: Diagnosis not present

## 2021-07-23 DIAGNOSIS — I6930 Unspecified sequelae of cerebral infarction: Secondary | ICD-10-CM | POA: Diagnosis not present

## 2021-07-23 DIAGNOSIS — I4891 Unspecified atrial fibrillation: Secondary | ICD-10-CM | POA: Diagnosis not present

## 2021-07-23 DIAGNOSIS — R410 Disorientation, unspecified: Secondary | ICD-10-CM | POA: Diagnosis not present

## 2021-07-23 DIAGNOSIS — E559 Vitamin D deficiency, unspecified: Secondary | ICD-10-CM | POA: Diagnosis not present

## 2021-07-23 DIAGNOSIS — D6869 Other thrombophilia: Secondary | ICD-10-CM | POA: Diagnosis not present

## 2021-07-25 ENCOUNTER — Telehealth: Payer: Self-pay

## 2021-07-25 NOTE — Telephone Encounter (Signed)
Spoke with patient's wife Benjamine Mola and scheduled a in person Palliative Consult for 08/07/21 @ 3 PM.  ? ?Consent obtained; updated Outlook/Netsmart/Team List and Epic.  ? ?

## 2021-08-07 ENCOUNTER — Encounter: Payer: Self-pay | Admitting: Family Medicine

## 2021-08-07 ENCOUNTER — Other Ambulatory Visit: Payer: Medicare Other | Admitting: Family Medicine

## 2021-08-07 VITALS — BP 120/82 | HR 84 | Temp 97.7°F | Resp 18

## 2021-08-07 DIAGNOSIS — I671 Cerebral aneurysm, nonruptured: Secondary | ICD-10-CM | POA: Diagnosis not present

## 2021-08-07 DIAGNOSIS — Z515 Encounter for palliative care: Secondary | ICD-10-CM | POA: Insufficient documentation

## 2021-08-07 DIAGNOSIS — Z7901 Long term (current) use of anticoagulants: Secondary | ICD-10-CM

## 2021-08-07 DIAGNOSIS — I482 Chronic atrial fibrillation, unspecified: Secondary | ICD-10-CM

## 2021-08-07 NOTE — Progress Notes (Signed)
? ? ?Manufacturing engineer ?Community Palliative Care Consult Note ?Telephone: [phone]  ?Fax: [phone]  ? ?Date of encounter: 08/07/21 ?3:23 PM ?PATIENT NAME: Steven Meadows ?[address] ?[place] [address]   ?[phone] (home) [phone] (work) ?DOB: 1931-11-06 ?MRN: [id number] ?PRIMARY CARE PROVIDER:    ?Steven Huddle, MD,  ?[address] ?[place] [address] ?[phone] ? ?REFERRING PROVIDER:   ?Steven Huddle, MD ?[place] ?[address] ?[place],  [place] [address] ?[phone] ? ?RESPONSIBLE PARTY:    ?Contact Information   ? ? Name Relation Home Work Mobile  ? Steven Meadows Spouse [phone]  [phone]  ? page, Steven Meadows Daughter   [phone]  ? ?  ? ? ? ?I met face to face with patient and wife, Steven Meadows in their home. Palliative Care was asked to follow this patient by consultation request of  Steven Huddle, MD to address advance care planning and complex medical decision making. This is the initial visit.  ? ? ?      ASSESSMENT, SYMPTOM MANAGEMENT AND PLAN / RECOMMENDATIONS:  ? Palliative Care Encounter ?Reviewed MOST form and made changes desired by patient. ? ?2.  Chronic atrial fibrillation and anticoagulation ?Continues to be rate controlled ?Continue Diltiazem and Eliquis-carefully monitor for bleeding. ? ?3.  Brain and vertebral aneurysm ?Keep BP controlled under 140/90  ?Monitor for bleeding ?Annual follow up with neurology ? ? ?Follow up Palliative Care Visit: Palliative care will continue to follow for complex medical decision making, advance care planning, and clarification of goals. Return 4 weeks or prn. ? ? ? ?This visit was coded based on medical decision making (MDM). ? ?PPS: 50% ? ?HOSPICE ELIGIBILITY/DIAGNOSIS: TBD ? ?Chief Complaint:  ?[facility] received a referral to follow up with patient for chronic disease management with hx of atrial fibrillation, multiple strokes and brain aneurysm.  Follow up is  also for advance directive planning and defining/refining goals of care. ? ?HISTORY OF PRESENT ILLNESS:  Steven Meadows is a 88 y.o. year old male with hx of multiple strokes, HTN, atrial fibrillation on long term anticoagulation, low back pain, HLD, hyponatremia and hypokalemia.  He also has a vertebral and a brain aneurysm which have not been repaired.  Last stroke per wife was a result of bleeding but was not from aneurysm. He has an aid from [employer] coming Tuesdays and Fridays to help with bathing. Urinary incontinence but has bowel control. No falls recently, ambulates with a rollator.  Denies pain, has some neuropathy in BLE.  Denies SOB. Gets up once or twice a night to go to the bathroom.  Sleeps more during the day since last stroke.  No problems with constipation so does not take Miralax.  Wife states he gets up late so he only takes Hydralazine BID. ? ?History obtained from review of EMR, discussion with wife, Steven Meadows and/or Steven Meadows.  ?I reviewed available labs, medications, imaging, studies and related documents from the EMR.  Records reviewed and summarized above.  ? ?ROS ?General: NAD ?EYES: denies vision changes ?ENMT: denies dysphagia ?Cardiovascular: denies chest pain, denies DOE ?Pulmonary: denies cough, denies increased SOB ?Abdomen: endorses good appetite, denies constipation, endorses continence of bowel ?GU: denies dysuria, endorses incontinence of urine ?MSK:  denies increased weakness, no falls reported ?Skin: denies rashes or wounds ?Neurological: denies pain, denies insomnia ?Psych: Endorses positive mood ?Heme/lymph/immuno: denies bruises, abnormal bleeding ? ?Physical Exam: ?Current and past weights: 218 lbs as of 06/10/21 ?Constitutional: NAD ?General:  WN, obese  ?EYES: anicteric sclera, lids intact, no discharge  ?ENMT: intact hearing, oral mucous membranes moist, dentition intact ?CV: S1S2, RRR, rate controlled today, 1+ pedal edema bilat ?Pulmonary: CTAB, no increased work of  breathing, no cough, room air ?Abdomen: normo-active BS + 4 quadrants, soft and non tender, no ascites ?GU: deferred ?MSK: no sarcopenia, moves all extremities, ambulatory with rollator ?Skin: warm and dry, no rashes or wounds on visible skin ?Neuro:  no generalized weakness, no cognitive impairment ?Psych: non-anxious affect, A and O x 3 ?Hem/lymph/immuno: no widespread bruising ? ?CURRENT PROBLEM LIST:  ?Patient Active Problem List  ? Diagnosis Date Noted  ? Transient ischemic attack 12/07/2020  ? Hyponatremia   ? Hypokalemia   ? AKI (acute kidney injury) ([place])   ? Benign essential HTN   ? Slow transit constipation   ? Thalamic hemorrhage ([place]) 05/03/2020  ? ICH (intracerebral hemorrhage) ([place]) 04/30/2020  ? Renal insufficiency 08/18/2019  ? Acute CVA (cerebrovascular accident) ([place]) 09/03/2018  ? Cerebrovascular accident (CVA) due to embolism of right posterior cerebral artery ([place]) 07/25/2015  ? Chronic anticoagulation 01/23/2015  ? HLD (hyperlipidemia) 01/23/2015  ? Essential hypertension 01/23/2015  ? Brain aneurysm 01/23/2015  ? History of CVA (cerebrovascular accident) 12/21/2014  ? Chronic atrial fibrillation ([place]) 12/21/2014  ? Aneurysm of vertebral artery ([place]) 12/21/2014  ? Stroke with cerebral ischemia ([place])   ? TIA (transient ischemic attack) 12/20/2014  ? Atrial fibrillation ([place]) 12/20/2014  ? Hyperlipidemia 06/17/2011  ? Stroke ([place]) 06/17/2011  ? Cough 06/17/2011  ? ?PAST MEDICAL HISTORY:  ?Active Ambulatory Problems  ?  Diagnosis Date Noted  ? Hyperlipidemia 06/17/2011  ? Stroke ([place]) 06/17/2011  ? Cough 06/17/2011  ? TIA (transient ischemic attack) 12/20/2014  ? Atrial fibrillation ([place]) 12/20/2014  ? History of CVA (cerebrovascular accident) 12/21/2014  ? Chronic atrial fibrillation ([place]) 12/21/2014  ? Aneurysm of vertebral artery ([place]) 12/21/2014  ? Stroke with cerebral ischemia ([place])   ? Chronic anticoagulation 01/23/2015  ? HLD (hyperlipidemia) 01/23/2015  ? Essential hypertension 01/23/2015   ? Brain aneurysm 01/23/2015  ? Cerebrovascular accident (CVA) due to embolism of right posterior cerebral artery ([place]) 07/25/2015  ? Acute CVA (cerebrovascular accident) ([place]) 09/03/2018  ? Renal insufficiency 08/18/2019  ? ICH (intracerebral hemorrhage) ([place]) 04/30/2020  ? Thalamic hemorrhage ([place]) 05/03/2020  ? Hyponatremia   ? Hypokalemia   ? AKI (acute kidney injury) ([place])   ? Benign essential HTN   ? Slow transit constipation   ? Transient ischemic attack 12/07/2020  ? ?Resolved Ambulatory Problems  ?  Diagnosis Date Noted  ? No Resolved Ambulatory Problems  ? ?Past Medical History:  ?Diagnosis Date  ? Abdominal aortic atherosclerosis ([place])   ? Actinic keratoses   ? History of mononucleosis   ? HTN (hypertension)   ? Hypercholesteremia   ? Lacunar stroke ([place])   ? Low back pain   ? Olecranon bursitis   ? Rotator cuff tear   ? ?SOCIAL HX:  ?Social History  ? ?Tobacco Use  ? Smoking status: Never  ? Smokeless tobacco: Never  ?Substance Use Topics  ? Alcohol use: Yes  ?  Alcohol/week: 0.0 standard drinks  ?  Comment: 2 drinks per night  ? ?FAMILY HX:  ?Family History  ?Problem Relation Age of Onset  ? Heart attack Paternal Grandfather   ? Heart attack Maternal Grandfather   ? Cancer Paternal Grandmother   ? Healthy Mother   ? Transient ischemic attack Mother   ? Healthy Father   ?  Healthy Sister   ?   ? ? ?Preferred Pharmacy: ?ALLERGIES:  ?Allergies  ?Allergen Reactions  ? Nabumetone Other (See Comments)  ?  Gi upset  ? Ace Inhibitors Cough  ? Flonase [Fluticasone Propionate] Other (See Comments)  ?  Causes nosebleeds  ?   ?PERTINENT MEDICATIONS:  ?Outpatient Encounter Medications as of 08/07/2021  ?Medication Sig  ? acetaminophen (TYLENOL) 325 MG tablet Take 2 tablets (650 mg total) by mouth every 4 (four) hours as needed for mild pain (or temp > 37.5 C (99.5 F)).  ? apixaban (ELIQUIS) 5 MG TABS tablet Take 1 tablet (5 mg total) by mouth 2 (two) times daily.  ? chlorhexidine (PERIDEX) 0.12 % solution Use as  directed 15 mLs in the mouth or throat See admin instructions. Rinse and spit 15 mls twice daily after meals  ? diltiazem (CARDIZEM CD) 240 MG 24 hr capsule Take 1 capsule (240 mg total) by mouth daily.  ? ez

## 2021-09-03 ENCOUNTER — Encounter: Payer: Self-pay | Admitting: Family Medicine

## 2021-09-03 ENCOUNTER — Other Ambulatory Visit: Payer: Medicare Other | Admitting: Family Medicine

## 2021-09-03 VITALS — BP 108/64 | HR 72 | Resp 18

## 2021-09-03 DIAGNOSIS — Z7901 Long term (current) use of anticoagulants: Secondary | ICD-10-CM | POA: Diagnosis not present

## 2021-09-03 DIAGNOSIS — I480 Paroxysmal atrial fibrillation: Secondary | ICD-10-CM | POA: Diagnosis not present

## 2021-09-03 DIAGNOSIS — I671 Cerebral aneurysm, nonruptured: Secondary | ICD-10-CM | POA: Diagnosis not present

## 2021-09-03 NOTE — Progress Notes (Signed)
? ? ?Manufacturing engineer ?[facility] Consult Note ?Telephone: [phone]  ?Fax: [phone]  ? ? ?Date of encounter: 09/03/21 ?3:04 PM ?PATIENT NAME: Steven Meadows ?[address] ?[place] [address]   ?[phone] (home) [phone] (work) ?DOB: [birth year] ?MRN: [id number] ?PRIMARY CARE PROVIDER:    ?Steven Huddle, MD,  ?[address] ?[place] [address] ?[phone] ? ?REFERRING PROVIDER:   ?Steven Huddle, MD ?[address] ?[address] ?[place],  [place] [address] ?[phone] ? ?RESPONSIBLE PARTY:    ?Contact Information   ? ? Name Relation Home Work Mobile  ? Steven, Meadows Spouse [phone]  [phone]  ? page, Steven Meadows Daughter   [phone]  ? ?  ? ? ? ?I met face to face with patient and spouse in their home. Palliative Care was asked to follow this patient by consultation request of  Steven Huddle, MD to address advance care planning and complex medical decision making. This is a follow up visit. ? ?                                 ASSESSMENT,SYMPTOM MANAGEMENT AND PLAN / RECOMMENDATIONS:  ? Atrial fibrillation with long term anticoagulation ?Stable with no evidence of bleeding. ?Continue Diltiazem for rate control and Eliquis for anticoagulation, monitor for bleeding ? ?Brain/vertebral aneurysm ?Stable with good BP control at goal < 140/80 ?Monitor for neuro change. ? ?Advance Care Planning/Goals of Care: Goals include to maximize quality of life and symptom management with pt remaining at home, if possible. ?CODE STATUS: ?MOST as of 04/30/20: ?Attempt CPR, do not intubate, limited additional interventions ?Antifbiotics and IV fluids if indicated ?No feeding tube ? ? ? ?Follow up Palliative Care Visit: Palliative care will continue to follow for complex medical decision making, advance care planning, and clarification of goals. Return 6 weeks or prn. ? ? ?This visit was coded based on medical decision making (MDM). ? ?PPS: 50% ? ?HOSPICE  ELIGIBILITY/DIAGNOSIS: TBD ? ?Chief Complaint:  ?Palliative Care is following for chronic medical management in setting of chronic atrial fibrillation, hx of stroke on anticoagulation with brain and vertebral aneurysm. ? ?HISTORY OF PRESENT ILLNESS:  Steven Meadows is a 88 y.o. year old male with hx of multiple strokes, HTN, atrial fibrillation on long term anticoagulation, low back pain, HLD, hyponatremia and hypokalemia.  He also has a vertebral and a brain aneurysm which have not been repaired.  Has urinary incontinence  but continent of bowel, uses rollator for ambulation. Last stroke per wife was a result of bleeding but was not from aneurysm. Steven Meadows Kitchen  ?No CP, SOB, nausea, constipation, falls and appetite is good.  Has had home caregiver today and got bath.  No changes in medications. He has no recent visits for care, labs or imaging. ? ?History obtained from review of EMR, discussion with wife, Steven Meadows and/or Steven Meadows.  ?I reviewed available labs, medications, imaging, studies and related documents from the EMR.  Records reviewed and summarized above.  ? ?ROS ?General: NAD ?EYES: denies vision changes ?ENMT: denies dysphagia ?Cardiovascular: denies chest pain, denies DOE ?Pulmonary: denies cough, denies increased SOB ?Abdomen: endorses good appetite, denies constipation, endorses continence of bowel ?GU: denies dysuria, endorses incontinence of urine ?MSK:  denies increased weakness,  no falls reported ?Skin: denies rashes or wounds ?Neurological: denies pain, denies insomnia ?Psych: Endorses positive mood ?Heme/lymph/immuno: denies bruises, abnormal bleeding ? ?Physical Exam: ?Past weight:  218  lbs as of 06/10/21 ?Constitutional: NAD ?General: WNWD ?EYES: anicteric sclera, lids intact, no discharge  ?ENMT: intact hearing, oral mucous membranes moist, dentition intact ?CV: S1S2, RRR today, rate controlled,1+ pedal edema ? Bilat ?Pulmonary: CTAB, no increased work of breathing, no cough, room air ?Abdomen:  normo-active BS + 4 quadrants, soft and non tender, no ascites ?GU: deferred ?MSK: no sarcopenia, moves all extremities, ambulatory ?Skin: warm and dry, no rashes or wounds on visible skin ?Neuro:  no generalized weakness,  no cognitive impairment ?Psych: non-anxious affect, A and O x 3 ?Hem/lymph/immuno: no widespread bruising ? ? ?Thank you for the opportunity to participate in the care of Steven Meadows.  The palliative care team will continue to follow. Please call our office at [phone] if we can be of additional assistance.  ? ?Marijo Conception, FNP-C ? ?COVID-19 PATIENT SCREENING TOOL ?Asked and negative response unless otherwise noted:  ? ?Have you had symptoms of covid, tested positive or been in contact with someone with symptoms/positive test in the past 5-10 days?  No ?

## 2021-09-23 DIAGNOSIS — Z961 Presence of intraocular lens: Secondary | ICD-10-CM | POA: Diagnosis not present

## 2021-09-23 DIAGNOSIS — H5213 Myopia, bilateral: Secondary | ICD-10-CM | POA: Diagnosis not present

## 2021-09-24 DIAGNOSIS — K219 Gastro-esophageal reflux disease without esophagitis: Secondary | ICD-10-CM | POA: Diagnosis not present

## 2021-09-24 DIAGNOSIS — I10 Essential (primary) hypertension: Secondary | ICD-10-CM | POA: Diagnosis not present

## 2021-09-24 DIAGNOSIS — R5383 Other fatigue: Secondary | ICD-10-CM | POA: Diagnosis not present

## 2021-09-24 DIAGNOSIS — E7800 Pure hypercholesterolemia, unspecified: Secondary | ICD-10-CM | POA: Diagnosis not present

## 2021-09-24 DIAGNOSIS — I4891 Unspecified atrial fibrillation: Secondary | ICD-10-CM | POA: Diagnosis not present

## 2021-09-24 DIAGNOSIS — D6869 Other thrombophilia: Secondary | ICD-10-CM | POA: Diagnosis not present

## 2021-09-24 DIAGNOSIS — E559 Vitamin D deficiency, unspecified: Secondary | ICD-10-CM | POA: Diagnosis not present

## 2021-09-24 DIAGNOSIS — M179 Osteoarthritis of knee, unspecified: Secondary | ICD-10-CM | POA: Diagnosis not present

## 2021-09-24 DIAGNOSIS — I6930 Unspecified sequelae of cerebral infarction: Secondary | ICD-10-CM | POA: Diagnosis not present

## 2021-09-24 DIAGNOSIS — R4589 Other symptoms and signs involving emotional state: Secondary | ICD-10-CM | POA: Diagnosis not present

## 2021-09-24 DIAGNOSIS — R410 Disorientation, unspecified: Secondary | ICD-10-CM | POA: Diagnosis not present

## 2021-10-15 ENCOUNTER — Other Ambulatory Visit: Payer: Medicare Other | Admitting: Family Medicine

## 2021-10-28 DIAGNOSIS — I4891 Unspecified atrial fibrillation: Secondary | ICD-10-CM | POA: Diagnosis not present

## 2021-10-28 DIAGNOSIS — E7800 Pure hypercholesterolemia, unspecified: Secondary | ICD-10-CM | POA: Diagnosis not present

## 2021-10-28 DIAGNOSIS — I10 Essential (primary) hypertension: Secondary | ICD-10-CM | POA: Diagnosis not present

## 2021-10-28 DIAGNOSIS — K219 Gastro-esophageal reflux disease without esophagitis: Secondary | ICD-10-CM | POA: Diagnosis not present

## 2021-10-29 ENCOUNTER — Other Ambulatory Visit: Payer: Medicare Other | Admitting: Family Medicine

## 2021-11-13 ENCOUNTER — Other Ambulatory Visit: Payer: Medicare Other | Admitting: Family Medicine

## 2021-11-13 VITALS — BP 136/64 | HR 69 | Resp 16

## 2021-11-13 DIAGNOSIS — I482 Chronic atrial fibrillation, unspecified: Secondary | ICD-10-CM

## 2021-11-13 DIAGNOSIS — I671 Cerebral aneurysm, nonruptured: Secondary | ICD-10-CM

## 2021-11-13 DIAGNOSIS — Z7901 Long term (current) use of anticoagulants: Secondary | ICD-10-CM | POA: Diagnosis not present

## 2021-11-17 ENCOUNTER — Encounter: Payer: Self-pay | Admitting: Family Medicine

## 2021-11-17 NOTE — Progress Notes (Signed)
[facility] Consult Note Telephone: [phone]  Fax: [phone]    Date of encounter: 11/13/2021 2:36 PM PATIENT NAME: Steven Meadows [address] [place] [address]   [phone] (home) [phone] (work) DOB: Mar 03, 1932 MRN: [id number] PRIMARY CARE PROVIDER:    Josetta Huddle, MD,  [address] [place] [address] [phone]  REFERRING PROVIDER:   Josetta Huddle, MD [address] [place],  [place] [address] [phone]  RESPONSIBLE PARTY:    Contact Information     Name Relation Home Work [place] Spouse [phone]  [phone]   page, Eustaquio Maize Daughter   [phone]        I met face to face with patient and wife in their home. Palliative Care was asked to follow this patient by consultation request of  Josetta Huddle, MD to address advance care planning and complex medical decision making. This is a follow up visit.   ASSESSMENT, SYMPTOM MANAGEMENT AND PLAN / RECOMMENDATIONS:   Chronic atrial fibrillation with anticoagulation/Brain aneurysm Mostly regular rhythm today with occasional irreg beat. No new focal neuro deficits, no evidence of bleeding BP at goal < 140/90     Advance Care Planning/Goals of Care: Goals include to maximize quality of life and symptom management.  CODE STATUS Per MOST 04/30/2020: Attempt CPR/limited additional intervention Antibiotics and IV fluids if indicated No feeding tube    Follow up Palliative Care Visit: Palliative care will continue to follow for complex medical decision making, advance care planning, and clarification of goals. Return 6 weeks or prn.    This visit was coded based on medical decision making (MDM).  PPS: 50%  HOSPICE ELIGIBILITY/DIAGNOSIS: TBD  Chief Complaint:  Palliative Care is following for chronic medical management in setting of cerebrovascular disease with hx of CVA and brain  aneurysm.  HISTORY OF PRESENT ILLNESS:  Steven Meadows is a 88 y.o. year old male with hx of multiple strokes, HTN, atrial fibrillation on long term anticoagulation, low back pain, HLD, hyponatremia and hypokalemia.  He also has a vertebral and a brain aneurysm which have not been repaired.  Has urinary incontinence  but continent of bowel, uses rollator for ambulation. Seen sitting up in his chair at home.  Denies CP, SOB, nausea, vomiting, falls, constipation.  Denies changes in his medications, appetite is good.   History obtained from review of EMR, discussion with family and/or Mr. Mitzel.  I reviewed EMR with no new available labs,, imaging, studies. No changes in medications noted.  ROS General: NAD ENMT: denies dysphagia Cardiovascular: denies chest pain, denies DOE Pulmonary: denies cough, denies increased SOB Abdomen: endorses good appetite, denies constipation, endorses continence of bowel GU: denies dysuria, urinary incontinence MSK:  denies increased weakness, no falls reported Neurological: denies pain, denies insomnia Psych: Endorses positive mood   Physical Exam: Constitutional: NAD General: WD/obese  CV: S1S2, irregular, bilat 1+ pedal edema Pulmonary: CTAB, no increased work of breathing, no cough, room air Abdomen: normo-active BS + 4 quadrants, soft and non tender MSK: no sarcopenia, moves all extremities, ambulatory with rollator Neuro:  no generalized weakness,  no cognitive impairment Psych: non-anxious and euthymic, A and O x 3   Thank you for the opportunity to participate in the care of Mr. Crumble.  The palliative care team will continue to follow. Please call our office at [phone] if we can be of additional assistance.   Marijo Conception, FNP -C  COVID-19 PATIENT SCREENING TOOL Asked and negative response unless otherwise noted:   Have you had symptoms of covid, tested positive or been in contact with someone with symptoms/positive test in the past  5-10 days?  no

## 2021-12-24 DIAGNOSIS — E7800 Pure hypercholesterolemia, unspecified: Secondary | ICD-10-CM | POA: Diagnosis not present

## 2021-12-24 DIAGNOSIS — K219 Gastro-esophageal reflux disease without esophagitis: Secondary | ICD-10-CM | POA: Diagnosis not present

## 2021-12-24 DIAGNOSIS — I4891 Unspecified atrial fibrillation: Secondary | ICD-10-CM | POA: Diagnosis not present

## 2021-12-24 DIAGNOSIS — I10 Essential (primary) hypertension: Secondary | ICD-10-CM | POA: Diagnosis not present

## 2021-12-31 DIAGNOSIS — R4589 Other symptoms and signs involving emotional state: Secondary | ICD-10-CM | POA: Diagnosis not present

## 2021-12-31 DIAGNOSIS — D6869 Other thrombophilia: Secondary | ICD-10-CM | POA: Diagnosis not present

## 2021-12-31 DIAGNOSIS — K219 Gastro-esophageal reflux disease without esophagitis: Secondary | ICD-10-CM | POA: Diagnosis not present

## 2021-12-31 DIAGNOSIS — I6930 Unspecified sequelae of cerebral infarction: Secondary | ICD-10-CM | POA: Diagnosis not present

## 2021-12-31 DIAGNOSIS — E7800 Pure hypercholesterolemia, unspecified: Secondary | ICD-10-CM | POA: Diagnosis not present

## 2021-12-31 DIAGNOSIS — R0982 Postnasal drip: Secondary | ICD-10-CM | POA: Diagnosis not present

## 2021-12-31 DIAGNOSIS — M179 Osteoarthritis of knee, unspecified: Secondary | ICD-10-CM | POA: Diagnosis not present

## 2021-12-31 DIAGNOSIS — E559 Vitamin D deficiency, unspecified: Secondary | ICD-10-CM | POA: Diagnosis not present

## 2021-12-31 DIAGNOSIS — R410 Disorientation, unspecified: Secondary | ICD-10-CM | POA: Diagnosis not present

## 2021-12-31 DIAGNOSIS — R5383 Other fatigue: Secondary | ICD-10-CM | POA: Diagnosis not present

## 2021-12-31 DIAGNOSIS — I4891 Unspecified atrial fibrillation: Secondary | ICD-10-CM | POA: Diagnosis not present

## 2021-12-31 DIAGNOSIS — I10 Essential (primary) hypertension: Secondary | ICD-10-CM | POA: Diagnosis not present

## 2022-01-02 ENCOUNTER — Other Ambulatory Visit: Payer: Medicare Other | Admitting: Family Medicine

## 2022-01-02 ENCOUNTER — Encounter: Payer: Self-pay | Admitting: Family Medicine

## 2022-01-02 VITALS — BP 102/56 | HR 62 | Resp 16

## 2022-01-02 DIAGNOSIS — I482 Chronic atrial fibrillation, unspecified: Secondary | ICD-10-CM | POA: Diagnosis not present

## 2022-01-02 DIAGNOSIS — Z515 Encounter for palliative care: Secondary | ICD-10-CM

## 2022-01-02 DIAGNOSIS — Z7901 Long term (current) use of anticoagulants: Secondary | ICD-10-CM

## 2022-01-02 DIAGNOSIS — I671 Cerebral aneurysm, nonruptured: Secondary | ICD-10-CM

## 2022-01-02 NOTE — Progress Notes (Signed)
[facility] Consult Note Telephone: [phone]  Fax: [phone]    Date of encounter: 01/02/2022 3:16 PM  PATIENT NAME: Steven Meadows [address] [place] [address]   [phone] (home) [phone] (work) DOB: [birth year] MRN: [id number] PRIMARY CARE PROVIDER:    Josetta Huddle, MD,  [address] [place] [address] [phone]  REFERRING PROVIDER:   Josetta Huddle, MD [address] [place],  [place] [address] [phone]  RESPONSIBLE PARTY:    Contact Information     Name Relation Home Work Steven Meadows Spouse [phone]  [phone]   page, Steven Meadows Daughter   [phone]        I met face to face with patient and wife in their home. Palliative Care was asked to follow this patient by consultation request of  Josetta Huddle, MD to address advance care planning and complex medical decision making. This is a follow up visit.   ASSESSMENT, SYMPTOM MANAGEMENT AND PLAN / RECOMMENDATIONS:   Chronic atrial fibrillation with anticoagulation/Brain aneurysm Rate controlled No new focal neuro deficits, no evidence of bleeding  2.   Palliative Care Encounter Advised pt and wife that there was a new surge in Covid cases in the county and to speak with PCP to see if he has bivalent Covid booster and schedule to receive influenza vaccine.  Advance Care Planning/Goals of Care: Goals include to maximize quality of life and symptom management.  CODE STATUS Per MOST 04/30/2020: Attempt CPR/limited additional intervention Antibiotics and IV fluids if indicated No feeding tube    Follow up Palliative Care Visit: Palliative care will continue to follow for complex medical decision making, advance care planning, and clarification of goals. Return 8 weeks or prn.    This visit was coded based on medical decision making (MDM).  PPS: 50%  HOSPICE ELIGIBILITY/DIAGNOSIS:  TBD  Chief Complaint:  Palliative Care is following for chronic medical management in setting of  prior embolic stroke.  HISTORY OF PRESENT ILLNESS:  GRAESON NOURI is a 88 y.o. year old male with hx of multiple strokes, HTN, atrial fibrillation on long term anticoagulation, low back pain, HLD, hyponatremia and hypokalemia.  He also has a vertebral and a brain aneurysm which have not been repaired.  Has urinary incontinence  but continent of bowel, uses rollator for ambulation. Seen sitting up in his chair at home.  Denies CP, SOB, nausea, vomiting, falls, constipation.  Denies changes in his medications, appetite is good.   History obtained from review of EMR, discussion with family and/or Steven Meadows.   I reviewed EMR with no new available labs, imaging, studies Or changes in medications noted since last visit.  ROS General: NAD ENMT: denies dysphagia Cardiovascular: denies chest pain, denies DOE Pulmonary: denies cough, denies increased SOB Abdomen: endorses good appetite, denies constipation, endorses continence of bowel GU: denies dysuria, urinary incontinence MSK:  denies increased weakness, no falls reported Neurological: denies pain, denies insomnia Psych: Endorses positive mood   Physical Exam: Constitutional: NAD General: WD/obese  CV: S1S2, IRIR, bilat 1+ pedal edema Pulmonary: CTAB, no increased work of breathing, no cough, room air Abdomen: normo-active BS + 4 quadrants, soft and non tender MSK: no sarcopenia, moves all extremities, ambulatory with rollator Neuro:  no generalized weakness,  no cognitive impairment Psych: non-anxious, euthymic and pleasant, A and O x 3   Thank you for the opportunity to participate in the care of [name].  The palliative care team will continue to follow. Please call our office at [phone] if we can be of additional assistance.   Marijo Conception, FNP -C  COVID-19 PATIENT SCREENING TOOL Asked and negative response unless  otherwise noted:   Have you had symptoms of covid, tested positive or been in contact with someone with symptoms/positive test in the past 5-10 days?  no

## 2022-01-08 DIAGNOSIS — L111 Transient acantholytic dermatosis [Grover]: Secondary | ICD-10-CM | POA: Diagnosis not present

## 2022-01-08 DIAGNOSIS — Z85828 Personal history of other malignant neoplasm of skin: Secondary | ICD-10-CM | POA: Diagnosis not present

## 2022-01-08 DIAGNOSIS — L821 Other seborrheic keratosis: Secondary | ICD-10-CM | POA: Diagnosis not present

## 2022-01-08 DIAGNOSIS — L812 Freckles: Secondary | ICD-10-CM | POA: Diagnosis not present

## 2022-01-08 DIAGNOSIS — L570 Actinic keratosis: Secondary | ICD-10-CM | POA: Diagnosis not present

## 2022-01-19 ENCOUNTER — Encounter (HOSPITAL_COMMUNITY): Payer: Self-pay

## 2022-01-19 ENCOUNTER — Other Ambulatory Visit: Payer: Self-pay

## 2022-01-19 ENCOUNTER — Emergency Department (HOSPITAL_COMMUNITY)
Admission: EM | Admit: 2022-01-19 | Discharge: 2022-01-19 | Disposition: A | Discharge status: Home / Self Care | Payer: Medicare Other | Attending: Emergency Medicine | Admitting: Emergency Medicine

## 2022-01-19 DIAGNOSIS — R944 Abnormal results of kidney function studies: Secondary | ICD-10-CM | POA: Insufficient documentation

## 2022-01-19 DIAGNOSIS — R4182 Altered mental status, unspecified: Secondary | ICD-10-CM | POA: Insufficient documentation

## 2022-01-19 DIAGNOSIS — F10920 Alcohol use, unspecified with intoxication, uncomplicated: Secondary | ICD-10-CM | POA: Diagnosis present

## 2022-01-19 DIAGNOSIS — R001 Bradycardia, unspecified: Secondary | ICD-10-CM | POA: Diagnosis not present

## 2022-01-19 DIAGNOSIS — I4891 Unspecified atrial fibrillation: Secondary | ICD-10-CM | POA: Diagnosis not present

## 2022-01-19 DIAGNOSIS — Z7901 Long term (current) use of anticoagulants: Secondary | ICD-10-CM | POA: Diagnosis not present

## 2022-01-19 DIAGNOSIS — I959 Hypotension, unspecified: Secondary | ICD-10-CM | POA: Diagnosis not present

## 2022-01-19 DIAGNOSIS — R531 Weakness: Secondary | ICD-10-CM | POA: Diagnosis not present

## 2022-01-19 DIAGNOSIS — R55 Syncope and collapse: Secondary | ICD-10-CM | POA: Diagnosis not present

## 2022-01-19 LAB — COMPREHENSIVE METABOLIC PANEL
ALT: 26 U/L (ref 0–44)
AST: 23 U/L (ref 15–41)
Albumin: 4.1 g/dL (ref 3.5–5.0)
Alkaline Phosphatase: 25 U/L — ABNORMAL LOW (ref 38–126)
Anion gap: 13 (ref 5–15)
BUN: 14 mg/dL (ref 8–23)
CO2: 22 mmol/L (ref 22–32)
Calcium: 9.8 mg/dL (ref 8.9–10.3)
Chloride: 101 mmol/L (ref 98–111)
Creatinine, Ser: 1.29 mg/dL — ABNORMAL HIGH (ref 0.61–1.24)
GFR, Estimated: 53 mL/min — ABNORMAL LOW (ref >60)
Glucose, Bld: 100 mg/dL — ABNORMAL HIGH (ref 70–99)
Potassium: 3.6 mmol/L (ref 3.5–5.1)
Sodium: 136 mmol/L (ref 135–145)
Total Bilirubin: 0.6 mg/dL (ref 0.3–1.2)
Total Protein: 6.6 g/dL (ref 6.5–8.1)

## 2022-01-19 LAB — CBC WITH DIFFERENTIAL/PLATELET
Abs Immature Granulocytes: 0.04 10*3/uL (ref 0.00–0.07)
Basophils Absolute: 0 10*3/uL (ref 0.0–0.1)
Basophils Relative: 1 %
Eosinophils Absolute: 0.1 10*3/uL (ref 0.0–0.5)
Eosinophils Relative: 2 %
HCT: 43.7 % (ref 39.0–52.0)
Hemoglobin: 14.5 g/dL (ref 13.0–17.0)
Immature Granulocytes: 1 %
Lymphocytes Relative: 31 %
Lymphs Abs: 1.9 10*3/uL (ref 0.7–4.0)
MCH: 29.8 pg (ref 26.0–34.0)
MCHC: 33.2 g/dL (ref 30.0–36.0)
MCV: 89.7 fL (ref 80.0–100.0)
Monocytes Absolute: 0.9 10*3/uL (ref 0.1–1.0)
Monocytes Relative: 14 %
Neutro Abs: 3.2 10*3/uL (ref 1.7–7.7)
Neutrophils Relative %: 51 %
Platelets: 193 10*3/uL (ref 150–400)
RBC: 4.87 MIL/uL (ref 4.22–5.81)
RDW: 13.3 % (ref 11.5–15.5)
WBC: 6.2 10*3/uL (ref 4.0–10.5)
nRBC: 0 % (ref 0.0–0.2)

## 2022-01-19 LAB — ETHANOL: Alcohol, Ethyl (B): 115 mg/dL — ABNORMAL HIGH (ref <10)

## 2022-01-19 MED ORDER — SODIUM CHLORIDE 0.9 % IV BOLUS
500.0000 mL | Freq: Once | INTRAVENOUS | Status: AC
  Administered 2022-01-19: 500 mL via INTRAVENOUS

## 2022-01-19 NOTE — Discharge Instructions (Signed)
Please avoid drinking too much alcohol.   Take your medicine as prescribed.  See your doctor for follow-up  Return to ER if you have lethargy, fall.
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
## 2022-01-21 DIAGNOSIS — I10 Essential (primary) hypertension: Secondary | ICD-10-CM | POA: Diagnosis not present

## 2022-01-21 DIAGNOSIS — E7800 Pure hypercholesterolemia, unspecified: Secondary | ICD-10-CM | POA: Diagnosis not present

## 2022-01-21 DIAGNOSIS — G4733 Obstructive sleep apnea (adult) (pediatric): Secondary | ICD-10-CM | POA: Diagnosis not present

## 2022-01-21 DIAGNOSIS — K219 Gastro-esophageal reflux disease without esophagitis: Secondary | ICD-10-CM | POA: Diagnosis not present

## 2022-01-21 DIAGNOSIS — I4891 Unspecified atrial fibrillation: Secondary | ICD-10-CM | POA: Diagnosis not present

## 2022-02-10 DIAGNOSIS — Z23 Encounter for immunization: Secondary | ICD-10-CM | POA: Diagnosis not present

## 2022-02-27 ENCOUNTER — Other Ambulatory Visit: Payer: Medicare Other | Admitting: Family Medicine

## 2022-03-03 DIAGNOSIS — K219 Gastro-esophageal reflux disease without esophagitis: Secondary | ICD-10-CM | POA: Diagnosis not present

## 2022-03-03 DIAGNOSIS — I10 Essential (primary) hypertension: Secondary | ICD-10-CM | POA: Diagnosis not present

## 2022-03-03 DIAGNOSIS — E7800 Pure hypercholesterolemia, unspecified: Secondary | ICD-10-CM | POA: Diagnosis not present

## 2022-03-03 DIAGNOSIS — M179 Osteoarthritis of knee, unspecified: Secondary | ICD-10-CM | POA: Diagnosis not present

## 2022-03-20 ENCOUNTER — Telehealth: Payer: Self-pay | Admitting: Family Medicine

## 2022-03-20 ENCOUNTER — Other Ambulatory Visit: Payer: Medicare Other | Admitting: Family Medicine
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
## 2022-05-02 ENCOUNTER — Telehealth: Payer: Self-pay | Admitting: Family Medicine

## 2022-05-02 NOTE — Telephone Encounter (Signed)
Received voice mail from pt's wife Benjamine Mola indicating she had to cancel last home appointment for a MD appt.  She is looking for someone to come back out and help.  She states being diagnosed with atrial fibrillation and needing more help than the bath aide she pays to come twice a week.  She is having to change his incontinence briefs TID.  She states he is sleeping more throughout the day and has been eating a little less.  He has had a couple of falls and she has had to get 911 to come out and help get him up.  She wants someone she can call in the evening if she has concerns.  Explained about restructure of Palliative Program and unless pt has had significant change that he no longer qualifies under the new Palliative Program designed to help those in most need more frequently.  Advised about private pay agencies and let her know that most of the care she is talking abthings she is requesting would be private pay.  She states he is having more difficulty getting up from a chair.  Advised that he may be able to get home PT and OT services to work on strengthening and safe transfers at which point as long as he is getting PT he can also qualify for home health aide.  She states pt doesn't believe he needs help and does not want to leave home.  Advised her to have her and her children discuss her health needs being met and having help in order to keep him home.  Advised there are some agencies like [facility] that do not require a set number of hours per week to be able to help.  Also advised if pt is declining function, losing weight, sleeping more that he may be qualified for hospice services.  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  Advised the order would need to come from his PCP and usually an indication that given the current course of disease the expected life span was 6 months or  less.  Advised that some people were still on Hospice after 6 months but in general were showing a decline.  She states that he was followed by Dr Mertha Finders but now will be seeing Dr Ignacia Felling in [place] practice but he has not seen patient yet.  Encouraged her to move up his upcoming appt from a few weeks from now to see if his doctor agreed about the 6 month time frame or to see if he could order home care.  Also advised she can refer herself to Hospice but they will need to contact his PCP.  Provided number for [facility] to request Hospice eval.  She thanked this provider.  Damaris Hippo FNP-C

## 2022-06-09 ENCOUNTER — Ambulatory Visit: Payer: Medicare Other | Admitting: Neurology

## 2022-06-11 DIAGNOSIS — I6930 Unspecified sequelae of cerebral infarction: Secondary | ICD-10-CM | POA: Diagnosis not present

## 2022-06-11 DIAGNOSIS — D6869 Other thrombophilia: Secondary | ICD-10-CM | POA: Diagnosis not present

## 2022-06-11 DIAGNOSIS — I4891 Unspecified atrial fibrillation: Secondary | ICD-10-CM | POA: Diagnosis not present

## 2022-06-11 DIAGNOSIS — R410 Disorientation, unspecified: Secondary | ICD-10-CM | POA: Diagnosis not present

## 2022-06-11 DIAGNOSIS — I10 Essential (primary) hypertension: Secondary | ICD-10-CM | POA: Diagnosis not present

## 2022-06-11 DIAGNOSIS — K219 Gastro-esophageal reflux disease without esophagitis: Secondary | ICD-10-CM | POA: Diagnosis not present

## 2022-06-11 DIAGNOSIS — E7800 Pure hypercholesterolemia, unspecified: Secondary | ICD-10-CM | POA: Diagnosis not present

## 2022-06-11 DIAGNOSIS — E559 Vitamin D deficiency, unspecified: Secondary | ICD-10-CM | POA: Diagnosis not present

## 2022-07-28 DIAGNOSIS — R7989 Other specified abnormal findings of blood chemistry: Secondary | ICD-10-CM | POA: Diagnosis not present

## 2022-07-28 DIAGNOSIS — R0989 Other specified symptoms and signs involving the circulatory and respiratory systems: Secondary | ICD-10-CM | POA: Diagnosis not present

## 2022-07-28 DIAGNOSIS — G629 Polyneuropathy, unspecified: Secondary | ICD-10-CM | POA: Diagnosis not present

## 2022-07-28 DIAGNOSIS — E559 Vitamin D deficiency, unspecified: Secondary | ICD-10-CM | POA: Diagnosis not present

## 2022-07-28 DIAGNOSIS — I10 Essential (primary) hypertension: Secondary | ICD-10-CM | POA: Diagnosis not present

## 2022-07-28 DIAGNOSIS — Z79899 Other long term (current) drug therapy: Secondary | ICD-10-CM | POA: Diagnosis not present

## 2022-07-28 DIAGNOSIS — Z0000 Encounter for general adult medical examination without abnormal findings: Secondary | ICD-10-CM | POA: Diagnosis not present

## 2022-07-28 DIAGNOSIS — M179 Osteoarthritis of knee, unspecified: Secondary | ICD-10-CM | POA: Diagnosis not present

## 2022-07-28 DIAGNOSIS — I6930 Unspecified sequelae of cerebral infarction: Secondary | ICD-10-CM | POA: Diagnosis not present

## 2022-07-28 DIAGNOSIS — E7800 Pure hypercholesterolemia, unspecified: Secondary | ICD-10-CM | POA: Diagnosis not present

## 2022-07-28 DIAGNOSIS — Z1331 Encounter for screening for depression: Secondary | ICD-10-CM | POA: Diagnosis not present

## 2022-07-28 DIAGNOSIS — D6869 Other thrombophilia: Secondary | ICD-10-CM | POA: Diagnosis not present

## 2022-07-28 DIAGNOSIS — I4891 Unspecified atrial fibrillation: Secondary | ICD-10-CM | POA: Diagnosis not present

## 2022-07-28 DIAGNOSIS — K219 Gastro-esophageal reflux disease without esophagitis: Secondary | ICD-10-CM | POA: Diagnosis not present

## 2022-07-28 DIAGNOSIS — E538 Deficiency of other specified B group vitamins: Secondary | ICD-10-CM | POA: Diagnosis not present

## 2022-08-21 IMAGING — MR MR MRA HEAD W/O CM
4 of 5 series · 13 of 48 positions shown · non-contrast
Comparison: 04/30/2020 CTA head

CLINICAL DATA: Transient ischemic attack.  Slurred speech.

EXAM:
MRI HEAD WITHOUT CONTRAST
MRA HEAD WITHOUT CONTRAST
TECHNIQUE: Multiplanar, multi-echo pulse sequences of the brain and surrounding
structures were acquired without intravenous contrast. Angiographic
images of the Circle of Willis were acquired using MRA technique
without intravenous contrast.

[Series 3: (id) mt fs · axial · 1.4mm · 0.43mm/px · z∈[-45,+29]mm · 10 of 136 slices shown]
[im 7/136]
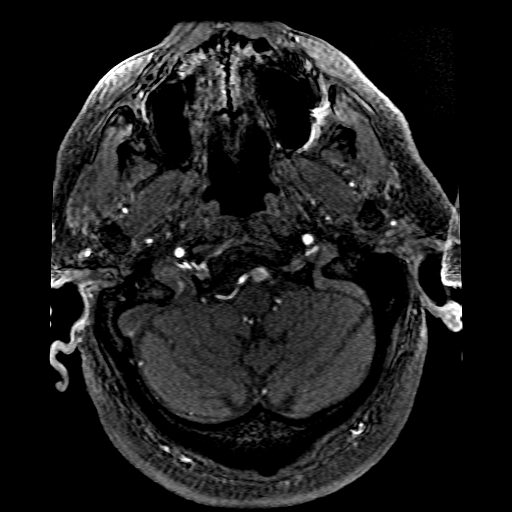
[im 22/136]
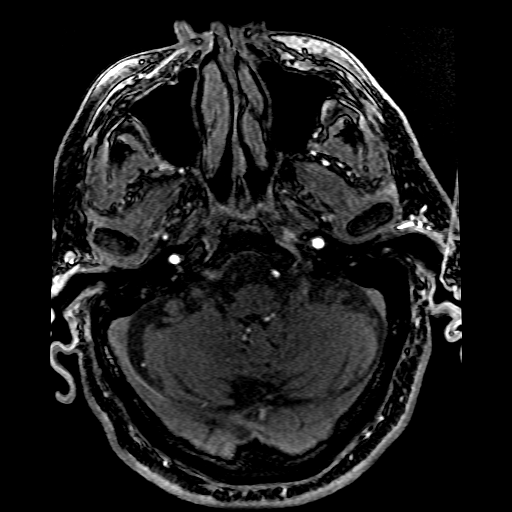
[im 26/136]
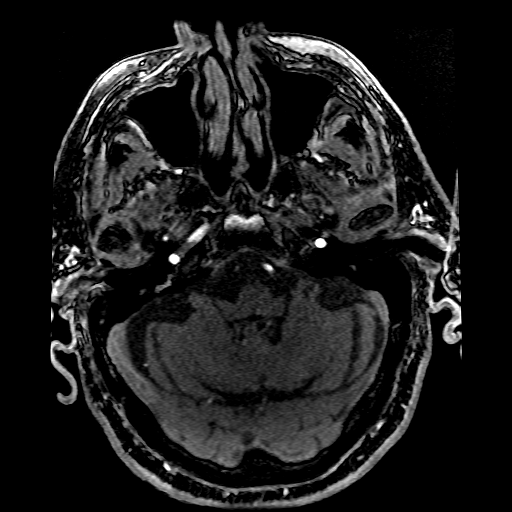
[im 41/136]
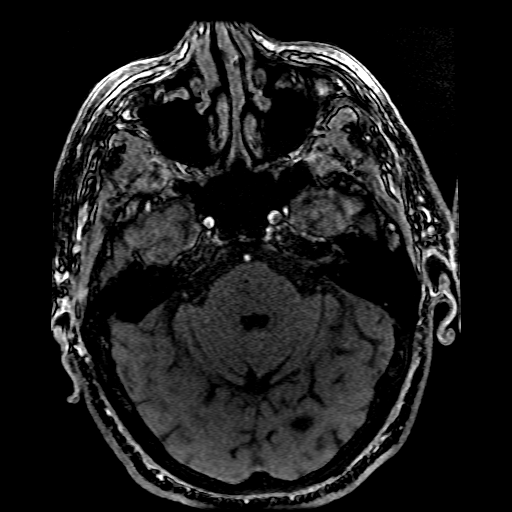
[im 60/136]
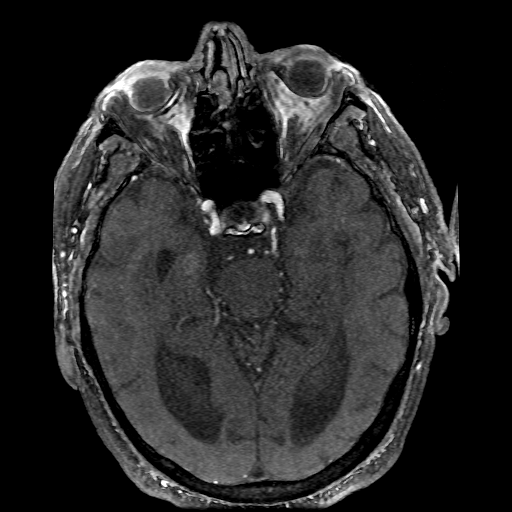
[im 70/136]
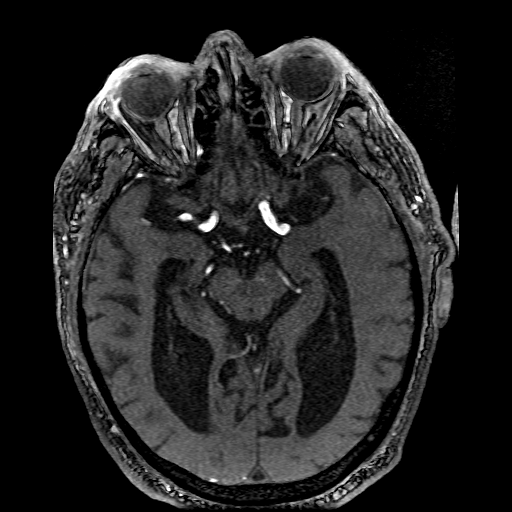
[im 76/136]
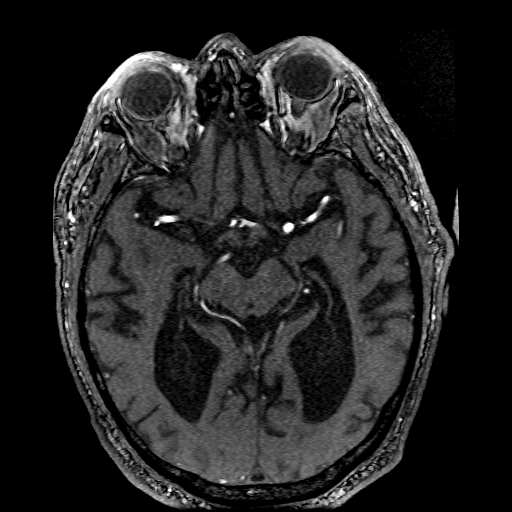
[im 95/136]
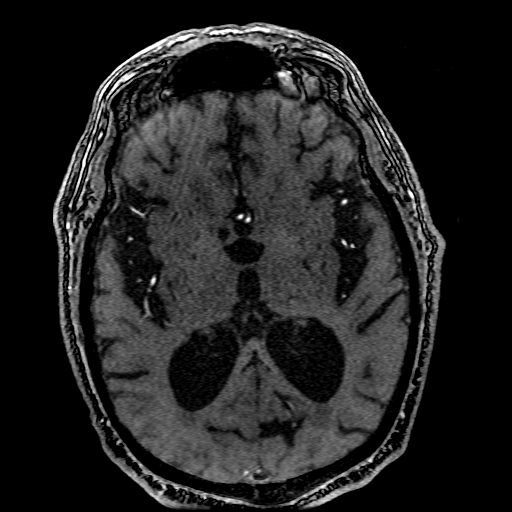
[im 110/136]
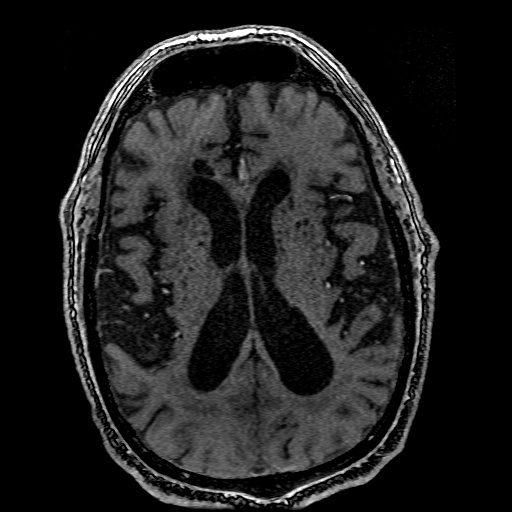
[im 114/136]
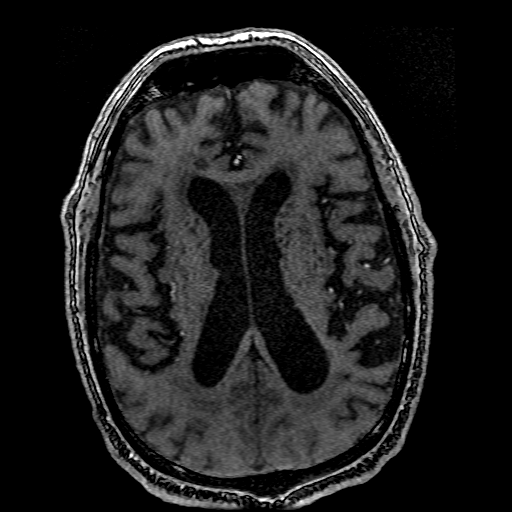

[Series 300: col:(id) mt fs · axial · 1.4mm · 0.43mm/px · 1 of 1 slices shown]
[im 1/1]
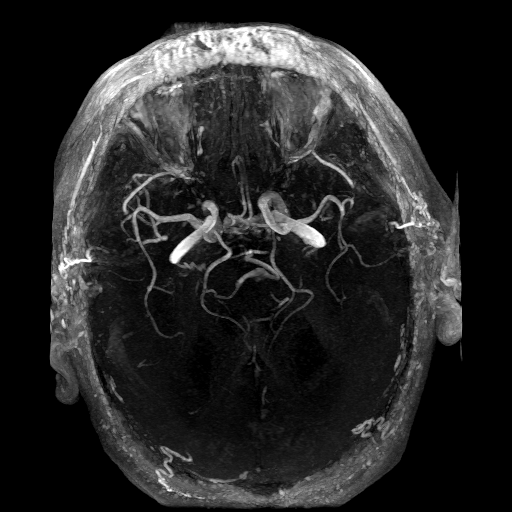

[Series 301: projection images · axial · 1.4mm · 0.21mm/px · 1 of 1 slices shown (1 of 2)]
[im 1/1]
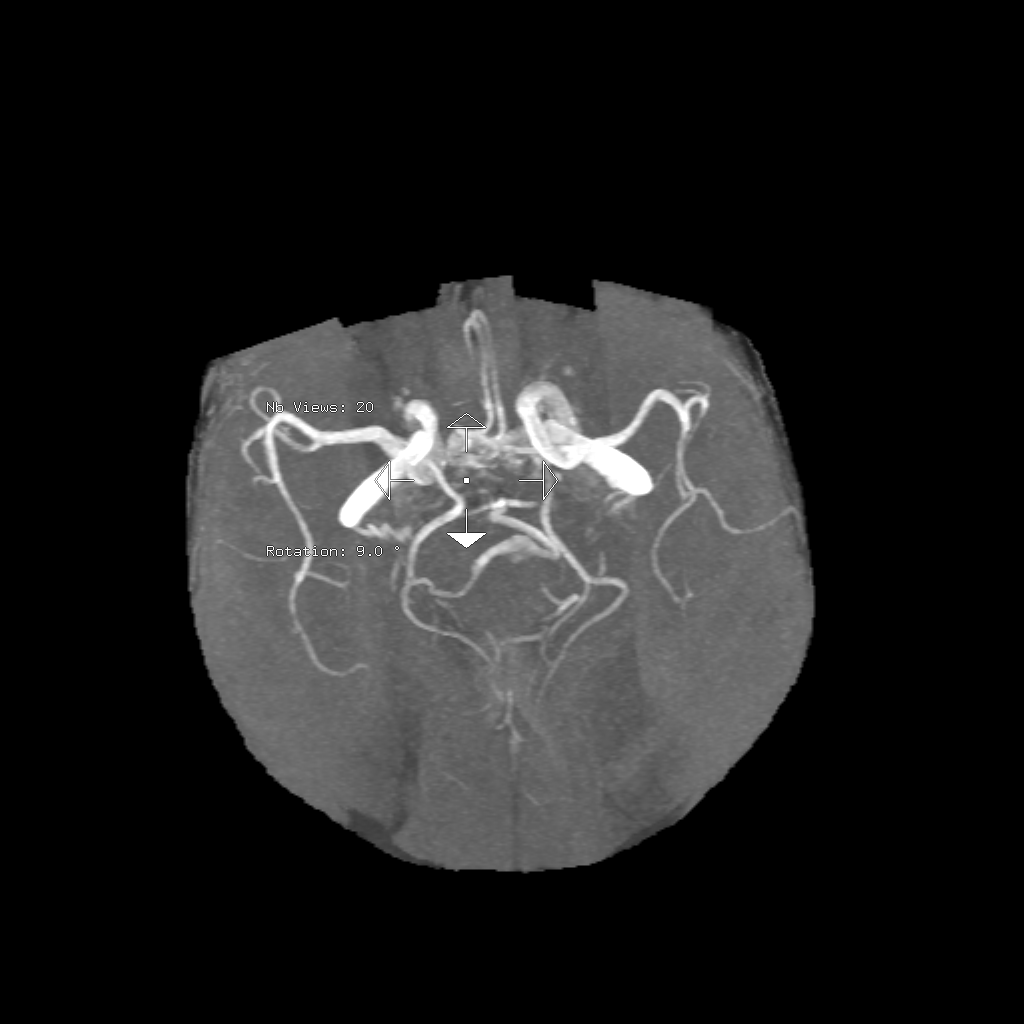

[Series 303: projection images · axial · 1.4mm · 0.21mm/px · 1 of 1 slices shown (2 of 2)]
[im 1/1]
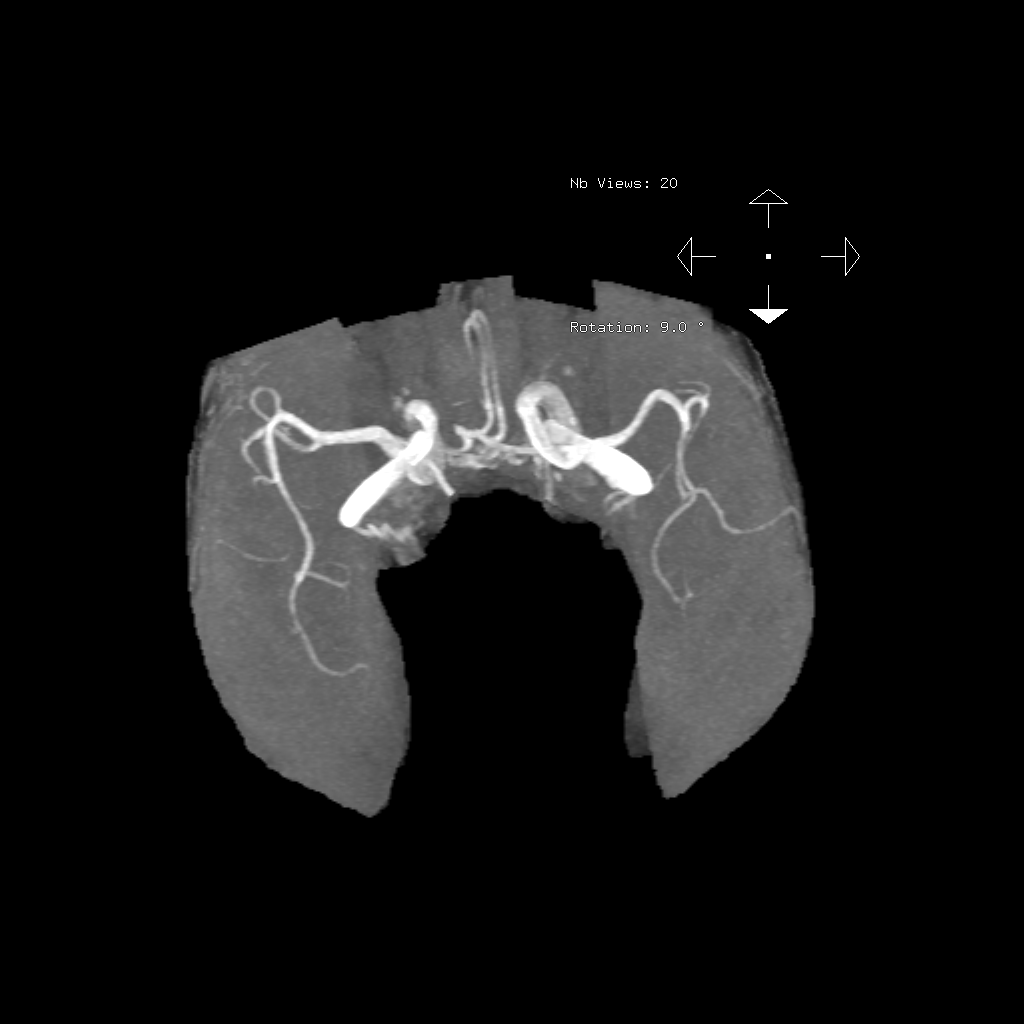

[13 of 48 positions shown; findings below may reference images not displayed]

FINDINGS: MRI HEAD FINDINGS

Brain: Small area of abnormal diffusion restriction within the right
parietal lobe. Chronic microhemorrhage in the left thalamus and
brainstem. Hyperintense T2-weighted signal is widespread throughout
the white matter. Diffuse, severe atrophy. The midline structures
are normal. Old right frontal small vessel infarct.

Vascular: Major flow voids are preserved.

Skull and upper cervical spine: Normal calvarium and skull base.
Visualized upper cervical spine and soft tissues are normal.

Sinuses/Orbits:No paranasal sinus fluid levels or advanced mucosal
thickening. No mastoid or middle ear effusion. Normal orbits.

MRA HEAD FINDINGS

POSTERIOR CIRCULATION:

--Vertebral arteries: Unchanged 6 mm aneurysm at the vertebrobasilar
confluence.

--Inferior cerebellar arteries: Normal.

--Basilar artery: Normal.

--Superior cerebellar arteries: Normal.

--Posterior cerebral arteries: Normal. Both are predominantly
supplied by the posterior communicating arteries (p-comm).

ANTERIOR CIRCULATION:

--Intracranial internal carotid arteries: Normal.

--Anterior cerebral arteries (ACA): Normal.

--Middle cerebral arteries (MCA): Normal.

ANATOMIC VARIANTS: Bilateral fetal origins of the posterior cerebral
arteries
IMPRESSION: 1. Small area of acute or early subacute ischemia within the right
parietal lobe. No hemorrhage or mass effect.
2. Unchanged 6 mm aneurysm at the vertebrobasilar confluence.
3. Diffuse, severe atrophy and chronic small vessel ischemic
changes.

## 2022-09-30 DIAGNOSIS — Z961 Presence of intraocular lens: Secondary | ICD-10-CM | POA: Diagnosis not present

## 2022-10-06 DIAGNOSIS — Z85828 Personal history of other malignant neoplasm of skin: Secondary | ICD-10-CM | POA: Diagnosis not present

## 2022-10-06 DIAGNOSIS — L821 Other seborrheic keratosis: Secondary | ICD-10-CM | POA: Diagnosis not present

## 2022-10-06 DIAGNOSIS — L812 Freckles: Secondary | ICD-10-CM | POA: Diagnosis not present

## 2022-10-06 DIAGNOSIS — L111 Transient acantholytic dermatosis [Grover]: Secondary | ICD-10-CM | POA: Diagnosis not present

## 2023-01-29 DIAGNOSIS — E538 Deficiency of other specified B group vitamins: Secondary | ICD-10-CM | POA: Diagnosis not present

## 2023-01-29 DIAGNOSIS — D6869 Other thrombophilia: Secondary | ICD-10-CM | POA: Diagnosis not present

## 2023-01-29 DIAGNOSIS — Z23 Encounter for immunization: Secondary | ICD-10-CM | POA: Diagnosis not present

## 2023-01-29 DIAGNOSIS — I4891 Unspecified atrial fibrillation: Secondary | ICD-10-CM | POA: Diagnosis not present

## 2023-01-29 DIAGNOSIS — K219 Gastro-esophageal reflux disease without esophagitis: Secondary | ICD-10-CM | POA: Diagnosis not present

## 2023-01-29 DIAGNOSIS — Z79899 Other long term (current) drug therapy: Secondary | ICD-10-CM | POA: Diagnosis not present

## 2023-01-29 DIAGNOSIS — I10 Essential (primary) hypertension: Secondary | ICD-10-CM | POA: Diagnosis not present

## 2023-01-29 DIAGNOSIS — G629 Polyneuropathy, unspecified: Secondary | ICD-10-CM | POA: Diagnosis not present

## 2023-01-29 DIAGNOSIS — M179 Osteoarthritis of knee, unspecified: Secondary | ICD-10-CM | POA: Diagnosis not present

## 2023-01-29 DIAGNOSIS — E559 Vitamin D deficiency, unspecified: Secondary | ICD-10-CM | POA: Diagnosis not present

## 2023-01-29 DIAGNOSIS — E7800 Pure hypercholesterolemia, unspecified: Secondary | ICD-10-CM | POA: Diagnosis not present

## 2023-01-29 DIAGNOSIS — I6930 Unspecified sequelae of cerebral infarction: Secondary | ICD-10-CM | POA: Diagnosis not present

## 2023-07-08 DIAGNOSIS — K219 Gastro-esophageal reflux disease without esophagitis: Secondary | ICD-10-CM | POA: Diagnosis not present

## 2023-07-08 DIAGNOSIS — R54 Age-related physical debility: Secondary | ICD-10-CM | POA: Diagnosis not present

## 2023-07-08 DIAGNOSIS — G629 Polyneuropathy, unspecified: Secondary | ICD-10-CM | POA: Diagnosis not present

## 2023-07-08 DIAGNOSIS — I10 Essential (primary) hypertension: Secondary | ICD-10-CM | POA: Diagnosis not present

## 2023-07-08 DIAGNOSIS — R4181 Age-related cognitive decline: Secondary | ICD-10-CM | POA: Diagnosis not present

## 2023-07-08 DIAGNOSIS — E559 Vitamin D deficiency, unspecified: Secondary | ICD-10-CM | POA: Diagnosis not present

## 2023-07-08 DIAGNOSIS — Z79899 Other long term (current) drug therapy: Secondary | ICD-10-CM | POA: Diagnosis not present

## 2023-07-08 DIAGNOSIS — E7800 Pure hypercholesterolemia, unspecified: Secondary | ICD-10-CM | POA: Diagnosis not present

## 2023-07-08 DIAGNOSIS — M179 Osteoarthritis of knee, unspecified: Secondary | ICD-10-CM | POA: Diagnosis not present

## 2023-07-08 DIAGNOSIS — E538 Deficiency of other specified B group vitamins: Secondary | ICD-10-CM | POA: Diagnosis not present

## 2023-07-08 DIAGNOSIS — R0989 Other specified symptoms and signs involving the circulatory and respiratory systems: Secondary | ICD-10-CM | POA: Diagnosis not present

## 2023-07-08 DIAGNOSIS — I4891 Unspecified atrial fibrillation: Secondary | ICD-10-CM | POA: Diagnosis not present

## 2023-07-08 DIAGNOSIS — D6869 Other thrombophilia: Secondary | ICD-10-CM | POA: Diagnosis not present

## 2023-07-27 DIAGNOSIS — R748 Abnormal levels of other serum enzymes: Secondary | ICD-10-CM | POA: Diagnosis not present

## 2023-08-04 DIAGNOSIS — Z1331 Encounter for screening for depression: Secondary | ICD-10-CM | POA: Diagnosis not present

## 2023-08-04 DIAGNOSIS — Z0000 Encounter for general adult medical examination without abnormal findings: Secondary | ICD-10-CM | POA: Diagnosis not present

## 2023-08-04 DIAGNOSIS — I10 Essential (primary) hypertension: Secondary | ICD-10-CM | POA: Diagnosis not present

## 2023-08-04 DIAGNOSIS — I4891 Unspecified atrial fibrillation: Secondary | ICD-10-CM | POA: Diagnosis not present

## 2023-08-04 DIAGNOSIS — E538 Deficiency of other specified B group vitamins: Secondary | ICD-10-CM | POA: Diagnosis not present

## 2023-08-04 DIAGNOSIS — M179 Osteoarthritis of knee, unspecified: Secondary | ICD-10-CM | POA: Diagnosis not present

## 2023-08-04 DIAGNOSIS — R5383 Other fatigue: Secondary | ICD-10-CM | POA: Diagnosis not present

## 2023-08-04 DIAGNOSIS — E559 Vitamin D deficiency, unspecified: Secondary | ICD-10-CM | POA: Diagnosis not present

## 2023-08-04 DIAGNOSIS — R4181 Age-related cognitive decline: Secondary | ICD-10-CM | POA: Diagnosis not present

## 2023-08-04 DIAGNOSIS — D6869 Other thrombophilia: Secondary | ICD-10-CM | POA: Diagnosis not present

## 2023-08-04 DIAGNOSIS — I6930 Unspecified sequelae of cerebral infarction: Secondary | ICD-10-CM | POA: Diagnosis not present

## 2023-08-04 DIAGNOSIS — E7800 Pure hypercholesterolemia, unspecified: Secondary | ICD-10-CM | POA: Diagnosis not present

## 2023-08-04 DIAGNOSIS — K219 Gastro-esophageal reflux disease without esophagitis: Secondary | ICD-10-CM | POA: Diagnosis not present

## 2023-08-04 DIAGNOSIS — G629 Polyneuropathy, unspecified: Secondary | ICD-10-CM | POA: Diagnosis not present

## 2023-08-11 DIAGNOSIS — I10 Essential (primary) hypertension: Secondary | ICD-10-CM | POA: Diagnosis not present

## 2023-08-11 DIAGNOSIS — M179 Osteoarthritis of knee, unspecified: Secondary | ICD-10-CM | POA: Diagnosis not present

## 2023-08-11 DIAGNOSIS — G4733 Obstructive sleep apnea (adult) (pediatric): Secondary | ICD-10-CM | POA: Diagnosis not present

## 2023-08-11 DIAGNOSIS — R41841 Cognitive communication deficit: Secondary | ICD-10-CM | POA: Diagnosis not present

## 2023-08-12 ENCOUNTER — Inpatient Hospital Stay (HOSPITAL_COMMUNITY)

## 2023-08-12 ENCOUNTER — Encounter (HOSPITAL_COMMUNITY): Payer: Self-pay

## 2023-08-12 ENCOUNTER — Inpatient Hospital Stay (HOSPITAL_COMMUNITY)
Admission: EM | Admit: 2023-08-12 | Discharge: 2023-08-21 | DRG: 871 | Disposition: A | Discharge status: Hospice | Attending: Internal Medicine | Admitting: Internal Medicine

## 2023-08-12 ENCOUNTER — Emergency Department (HOSPITAL_COMMUNITY)

## 2023-08-12 ENCOUNTER — Other Ambulatory Visit: Payer: Self-pay

## 2023-08-12 DIAGNOSIS — I5031 Acute diastolic (congestive) heart failure: Secondary | ICD-10-CM | POA: Diagnosis not present

## 2023-08-12 DIAGNOSIS — S40212A Abrasion of left shoulder, initial encounter: Secondary | ICD-10-CM | POA: Diagnosis not present

## 2023-08-12 DIAGNOSIS — I482 Chronic atrial fibrillation, unspecified: Secondary | ICD-10-CM | POA: Diagnosis present

## 2023-08-12 DIAGNOSIS — Z66 Do not resuscitate: Secondary | ICD-10-CM | POA: Diagnosis present

## 2023-08-12 DIAGNOSIS — I639 Cerebral infarction, unspecified: Secondary | ICD-10-CM | POA: Diagnosis not present

## 2023-08-12 DIAGNOSIS — E871 Hypo-osmolality and hyponatremia: Secondary | ICD-10-CM | POA: Diagnosis present

## 2023-08-12 DIAGNOSIS — R063 Periodic breathing: Secondary | ICD-10-CM | POA: Diagnosis not present

## 2023-08-12 DIAGNOSIS — A419 Sepsis, unspecified organism: Secondary | ICD-10-CM | POA: Diagnosis present

## 2023-08-12 DIAGNOSIS — Z8673 Personal history of transient ischemic attack (TIA), and cerebral infarction without residual deficits: Secondary | ICD-10-CM | POA: Diagnosis not present

## 2023-08-12 DIAGNOSIS — E041 Nontoxic single thyroid nodule: Secondary | ICD-10-CM | POA: Diagnosis not present

## 2023-08-12 DIAGNOSIS — E860 Dehydration: Secondary | ICD-10-CM | POA: Diagnosis not present

## 2023-08-12 DIAGNOSIS — K828 Other specified diseases of gallbladder: Secondary | ICD-10-CM | POA: Diagnosis not present

## 2023-08-12 DIAGNOSIS — R0689 Other abnormalities of breathing: Secondary | ICD-10-CM | POA: Diagnosis not present

## 2023-08-12 DIAGNOSIS — E7800 Pure hypercholesterolemia, unspecified: Secondary | ICD-10-CM | POA: Diagnosis present

## 2023-08-12 DIAGNOSIS — R414 Neurologic neglect syndrome: Secondary | ICD-10-CM | POA: Diagnosis not present

## 2023-08-12 DIAGNOSIS — S199XXA Unspecified injury of neck, initial encounter: Secondary | ICD-10-CM | POA: Diagnosis not present

## 2023-08-12 DIAGNOSIS — I10 Essential (primary) hypertension: Secondary | ICD-10-CM | POA: Diagnosis present

## 2023-08-12 DIAGNOSIS — Z452 Encounter for adjustment and management of vascular access device: Secondary | ICD-10-CM | POA: Diagnosis not present

## 2023-08-12 DIAGNOSIS — E785 Hyperlipidemia, unspecified: Secondary | ICD-10-CM | POA: Diagnosis present

## 2023-08-12 DIAGNOSIS — G40901 Epilepsy, unspecified, not intractable, with status epilepticus: Secondary | ICD-10-CM | POA: Diagnosis present

## 2023-08-12 DIAGNOSIS — G0490 Encephalitis and encephalomyelitis, unspecified: Secondary | ICD-10-CM | POA: Diagnosis not present

## 2023-08-12 DIAGNOSIS — Z888 Allergy status to other drugs, medicaments and biological substances status: Secondary | ICD-10-CM | POA: Diagnosis not present

## 2023-08-12 DIAGNOSIS — G9340 Encephalopathy, unspecified: Secondary | ICD-10-CM | POA: Diagnosis not present

## 2023-08-12 DIAGNOSIS — Z79899 Other long term (current) drug therapy: Secondary | ICD-10-CM

## 2023-08-12 DIAGNOSIS — S0990XA Unspecified injury of head, initial encounter: Secondary | ICD-10-CM | POA: Diagnosis not present

## 2023-08-12 DIAGNOSIS — F445 Conversion disorder with seizures or convulsions: Secondary | ICD-10-CM | POA: Diagnosis not present

## 2023-08-12 DIAGNOSIS — Z823 Family history of stroke: Secondary | ICD-10-CM

## 2023-08-12 DIAGNOSIS — R339 Retention of urine, unspecified: Secondary | ICD-10-CM | POA: Diagnosis not present

## 2023-08-12 DIAGNOSIS — R270 Ataxia, unspecified: Secondary | ICD-10-CM | POA: Diagnosis not present

## 2023-08-12 DIAGNOSIS — I672 Cerebral atherosclerosis: Secondary | ICD-10-CM | POA: Diagnosis not present

## 2023-08-12 DIAGNOSIS — Z043 Encounter for examination and observation following other accident: Secondary | ICD-10-CM | POA: Diagnosis not present

## 2023-08-12 DIAGNOSIS — Z7901 Long term (current) use of anticoagulants: Secondary | ICD-10-CM | POA: Diagnosis not present

## 2023-08-12 DIAGNOSIS — R29818 Other symptoms and signs involving the nervous system: Secondary | ICD-10-CM | POA: Diagnosis not present

## 2023-08-12 DIAGNOSIS — I6523 Occlusion and stenosis of bilateral carotid arteries: Secondary | ICD-10-CM | POA: Diagnosis not present

## 2023-08-12 DIAGNOSIS — E872 Acidosis, unspecified: Secondary | ICD-10-CM | POA: Diagnosis present

## 2023-08-12 DIAGNOSIS — Z1152 Encounter for screening for COVID-19: Secondary | ICD-10-CM | POA: Diagnosis not present

## 2023-08-12 DIAGNOSIS — I679 Cerebrovascular disease, unspecified: Secondary | ICD-10-CM | POA: Diagnosis not present

## 2023-08-12 DIAGNOSIS — S50312A Abrasion of left elbow, initial encounter: Secondary | ICD-10-CM | POA: Diagnosis present

## 2023-08-12 DIAGNOSIS — W06XXXA Fall from bed, initial encounter: Secondary | ICD-10-CM | POA: Diagnosis present

## 2023-08-12 DIAGNOSIS — R4189 Other symptoms and signs involving cognitive functions and awareness: Principal | ICD-10-CM

## 2023-08-12 DIAGNOSIS — R569 Unspecified convulsions: Secondary | ICD-10-CM

## 2023-08-12 DIAGNOSIS — R918 Other nonspecific abnormal finding of lung field: Secondary | ICD-10-CM | POA: Diagnosis not present

## 2023-08-12 DIAGNOSIS — Z809 Family history of malignant neoplasm, unspecified: Secondary | ICD-10-CM

## 2023-08-12 DIAGNOSIS — Z7189 Other specified counseling: Secondary | ICD-10-CM | POA: Diagnosis not present

## 2023-08-12 DIAGNOSIS — R9089 Other abnormal findings on diagnostic imaging of central nervous system: Secondary | ICD-10-CM | POA: Diagnosis not present

## 2023-08-12 DIAGNOSIS — Y92092 Bedroom in other non-institutional residence as the place of occurrence of the external cause: Secondary | ICD-10-CM | POA: Diagnosis not present

## 2023-08-12 DIAGNOSIS — J90 Pleural effusion, not elsewhere classified: Secondary | ICD-10-CM | POA: Diagnosis not present

## 2023-08-12 DIAGNOSIS — G8194 Hemiplegia, unspecified affecting left nondominant side: Secondary | ICD-10-CM | POA: Diagnosis not present

## 2023-08-12 DIAGNOSIS — Z8249 Family history of ischemic heart disease and other diseases of the circulatory system: Secondary | ICD-10-CM

## 2023-08-12 DIAGNOSIS — S0181XA Laceration without foreign body of other part of head, initial encounter: Secondary | ICD-10-CM | POA: Diagnosis not present

## 2023-08-12 DIAGNOSIS — G319 Degenerative disease of nervous system, unspecified: Secondary | ICD-10-CM | POA: Diagnosis not present

## 2023-08-12 DIAGNOSIS — D72829 Elevated white blood cell count, unspecified: Secondary | ICD-10-CM | POA: Diagnosis not present

## 2023-08-12 DIAGNOSIS — Z8661 Personal history of infections of the central nervous system: Secondary | ICD-10-CM

## 2023-08-12 DIAGNOSIS — M6282 Rhabdomyolysis: Secondary | ICD-10-CM | POA: Diagnosis present

## 2023-08-12 DIAGNOSIS — E876 Hypokalemia: Secondary | ICD-10-CM | POA: Diagnosis not present

## 2023-08-12 DIAGNOSIS — N179 Acute kidney failure, unspecified: Secondary | ICD-10-CM | POA: Diagnosis present

## 2023-08-12 DIAGNOSIS — M7989 Other specified soft tissue disorders: Secondary | ICD-10-CM | POA: Diagnosis not present

## 2023-08-12 DIAGNOSIS — M19012 Primary osteoarthritis, left shoulder: Secondary | ICD-10-CM | POA: Diagnosis not present

## 2023-08-12 DIAGNOSIS — I700 Atherosclerosis of aorta: Secondary | ICD-10-CM | POA: Diagnosis not present

## 2023-08-12 DIAGNOSIS — Z515 Encounter for palliative care: Secondary | ICD-10-CM

## 2023-08-12 DIAGNOSIS — S299XXA Unspecified injury of thorax, initial encounter: Secondary | ICD-10-CM | POA: Diagnosis not present

## 2023-08-12 DIAGNOSIS — J690 Pneumonitis due to inhalation of food and vomit: Secondary | ICD-10-CM | POA: Diagnosis present

## 2023-08-12 DIAGNOSIS — R4182 Altered mental status, unspecified: Secondary | ICD-10-CM | POA: Diagnosis not present

## 2023-08-12 DIAGNOSIS — I614 Nontraumatic intracerebral hemorrhage in cerebellum: Secondary | ICD-10-CM | POA: Diagnosis not present

## 2023-08-12 DIAGNOSIS — R29810 Facial weakness: Secondary | ICD-10-CM | POA: Diagnosis not present

## 2023-08-12 DIAGNOSIS — R7401 Elevation of levels of liver transaminase levels: Secondary | ICD-10-CM | POA: Diagnosis not present

## 2023-08-12 DIAGNOSIS — I4891 Unspecified atrial fibrillation: Secondary | ICD-10-CM | POA: Diagnosis not present

## 2023-08-12 DIAGNOSIS — R509 Fever, unspecified: Secondary | ICD-10-CM | POA: Diagnosis not present

## 2023-08-12 DIAGNOSIS — R0902 Hypoxemia: Secondary | ICD-10-CM | POA: Diagnosis not present

## 2023-08-12 DIAGNOSIS — J9811 Atelectasis: Secondary | ICD-10-CM | POA: Diagnosis not present

## 2023-08-12 DIAGNOSIS — R404 Transient alteration of awareness: Secondary | ICD-10-CM | POA: Diagnosis not present

## 2023-08-12 DIAGNOSIS — R791 Abnormal coagulation profile: Secondary | ICD-10-CM | POA: Diagnosis not present

## 2023-08-12 DIAGNOSIS — R0602 Shortness of breath: Secondary | ICD-10-CM | POA: Diagnosis not present

## 2023-08-12 DIAGNOSIS — I4510 Unspecified right bundle-branch block: Secondary | ICD-10-CM | POA: Diagnosis not present

## 2023-08-12 DIAGNOSIS — I6782 Cerebral ischemia: Secondary | ICD-10-CM | POA: Diagnosis not present

## 2023-08-12 DIAGNOSIS — G629 Polyneuropathy, unspecified: Secondary | ICD-10-CM | POA: Diagnosis present

## 2023-08-12 HISTORY — DX: Cerebral infarction, unspecified: I63.9

## 2023-08-12 HISTORY — DX: Acute kidney failure, unspecified: N17.9

## 2023-08-12 LAB — I-STAT CHEM 8, ED
BUN: 17 mg/dL (ref 8–23)
Calcium, Ion: 1.03 mmol/L — ABNORMAL LOW (ref 1.15–1.40)
Chloride: 100 mmol/L (ref 98–111)
Creatinine, Ser: 1.5 mg/dL — ABNORMAL HIGH (ref 0.61–1.24)
Glucose, Bld: 171 mg/dL — ABNORMAL HIGH (ref 70–99)
HCT: 49 % (ref 39.0–52.0)
Hemoglobin: 16.7 g/dL (ref 13.0–17.0)
Potassium: 5 mmol/L (ref 3.5–5.1)
Sodium: 132 mmol/L — ABNORMAL LOW (ref 135–145)
TCO2: 20 mmol/L — ABNORMAL LOW (ref 22–32)

## 2023-08-12 LAB — URINALYSIS, W/ REFLEX TO CULTURE (INFECTION SUSPECTED)
Bilirubin Urine: NEGATIVE
Glucose, UA: NEGATIVE mg/dL
Ketones, ur: NEGATIVE mg/dL
Leukocytes,Ua: NEGATIVE
Nitrite: NEGATIVE
Protein, ur: 100 mg/dL — AB
Specific Gravity, Urine: 1.016 (ref 1.005–1.030)
pH: 6 (ref 5.0–8.0)

## 2023-08-12 LAB — RAPID URINE DRUG SCREEN, HOSP PERFORMED
Amphetamines: NOT DETECTED
Barbiturates: NOT DETECTED
Benzodiazepines: NOT DETECTED
Cocaine: NOT DETECTED
Opiates: NOT DETECTED
Tetrahydrocannabinol: NOT DETECTED

## 2023-08-12 LAB — COMPREHENSIVE METABOLIC PANEL WITH GFR
ALT: 31 U/L (ref 0–44)
AST: 49 U/L — ABNORMAL HIGH (ref 15–41)
Albumin: 4.2 g/dL (ref 3.5–5.0)
Alkaline Phosphatase: 31 U/L — ABNORMAL LOW (ref 38–126)
Anion gap: 18 — ABNORMAL HIGH (ref 5–15)
BUN: 15 mg/dL (ref 8–23)
CO2: 18 mmol/L — ABNORMAL LOW (ref 22–32)
Calcium: 9.1 mg/dL (ref 8.9–10.3)
Chloride: 97 mmol/L — ABNORMAL LOW (ref 98–111)
Creatinine, Ser: 1.5 mg/dL — ABNORMAL HIGH (ref 0.61–1.24)
GFR, Estimated: 44 mL/min — ABNORMAL LOW (ref >60)
Glucose, Bld: 169 mg/dL — ABNORMAL HIGH (ref 70–99)
Potassium: 4.9 mmol/L (ref 3.5–5.1)
Sodium: 133 mmol/L — ABNORMAL LOW (ref 135–145)
Total Bilirubin: 0.5 mg/dL (ref 0.0–1.2)
Total Protein: 7.8 g/dL (ref 6.5–8.1)

## 2023-08-12 LAB — CBC WITH DIFFERENTIAL/PLATELET
Abs Immature Granulocytes: 0.25 10*3/uL — ABNORMAL HIGH (ref 0.00–0.07)
Basophils Absolute: 0 10*3/uL (ref 0.0–0.1)
Basophils Relative: 0 %
Eosinophils Absolute: 0 10*3/uL (ref 0.0–0.5)
Eosinophils Relative: 0 %
HCT: 48 % (ref 39.0–52.0)
Hemoglobin: 15.7 g/dL (ref 13.0–17.0)
Immature Granulocytes: 1 %
Lymphocytes Relative: 5 %
Lymphs Abs: 1 10*3/uL (ref 0.7–4.0)
MCH: 28.6 pg (ref 26.0–34.0)
MCHC: 32.7 g/dL (ref 30.0–36.0)
MCV: 87.6 fL (ref 80.0–100.0)
Monocytes Absolute: 2.7 10*3/uL — ABNORMAL HIGH (ref 0.1–1.0)
Monocytes Relative: 12 %
Neutro Abs: 17.4 10*3/uL — ABNORMAL HIGH (ref 1.7–7.7)
Neutrophils Relative %: 82 %
Platelets: 246 10*3/uL (ref 150–400)
RBC: 5.48 MIL/uL (ref 4.22–5.81)
RDW: 13.5 % (ref 11.5–15.5)
WBC: 21.4 10*3/uL — ABNORMAL HIGH (ref 4.0–10.5)
nRBC: 0 % (ref 0.0–0.2)

## 2023-08-12 LAB — RESP PANEL BY RT-PCR (RSV, FLU A&B, COVID)  RVPGX2
Influenza A by PCR: NEGATIVE
Influenza B by PCR: NEGATIVE
Resp Syncytial Virus by PCR: NEGATIVE
SARS Coronavirus 2 by RT PCR: NEGATIVE

## 2023-08-12 LAB — PROTIME-INR
INR: 1.3 — ABNORMAL HIGH (ref 0.8–1.2)
Prothrombin Time: 16 s — ABNORMAL HIGH (ref 11.4–15.2)

## 2023-08-12 LAB — I-STAT CG4 LACTIC ACID, ED
Lactic Acid, Venous: 3.5 mmol/L (ref 0.5–1.9)
Lactic Acid, Venous: 3.8 mmol/L (ref 0.5–1.9)

## 2023-08-12 LAB — APTT: aPTT: 38 s — ABNORMAL HIGH (ref 24–36)

## 2023-08-12 LAB — ETHANOL: Alcohol, Ethyl (B): <10 mg/dL (ref <10)

## 2023-08-12 LAB — CK: Total CK: 3367 U/L — ABNORMAL HIGH (ref 49–397)

## 2023-08-12 MED ORDER — METRONIDAZOLE 500 MG/100ML IV SOLN
500.0000 mg | Freq: Once | INTRAVENOUS | Status: AC
  Administered 2023-08-12: 500 mg via INTRAVENOUS
  Filled 2023-08-12: qty 100

## 2023-08-12 MED ORDER — LACTATED RINGERS IV SOLN
INTRAVENOUS | Status: AC
Start: 2023-08-12 — End: 2023-08-13

## 2023-08-12 MED ORDER — LORAZEPAM 2 MG/ML IJ SOLN: 2.0000 mg | Freq: Once | INTRAMUSCULAR | Status: DC | PRN

## 2023-08-12 MED ORDER — ACETAMINOPHEN 325 MG PO TABS
650.0000 mg | ORAL_TABLET | Freq: Four times a day (QID) | ORAL | Status: DC | PRN
  Administered 2023-08-13: 650 mg via ORAL

## 2023-08-12 MED ORDER — HEPARIN (PORCINE) 25000 UT/250ML-% IV SOLN
1200.0000 [IU]/h | INTRAVENOUS | Status: DC
  Administered 2023-08-12 – 2023-08-13 (×2): 1200 [IU]/h via INTRAVENOUS
  Filled 2023-08-12 (×2): qty 250

## 2023-08-12 MED ORDER — VANCOMYCIN HCL 2000 MG/400ML IV SOLN
2000.0000 mg | Freq: Once | INTRAVENOUS | Status: AC
  Administered 2023-08-12: 2000 mg via INTRAVENOUS
  Filled 2023-08-12: qty 400

## 2023-08-12 MED ORDER — SODIUM CHLORIDE 0.9 % IV SOLN
INTRAVENOUS | Status: DC
Start: 2023-08-12 — End: 2023-08-12

## 2023-08-12 MED ORDER — SODIUM CHLORIDE 0.9 % IV SOLN
2.0000 g | Freq: Four times a day (QID) | INTRAVENOUS | Status: DC
  Administered 2023-08-12 – 2023-08-15 (×11): 2 g via INTRAVENOUS
  Filled 2023-08-12 (×12): qty 2000

## 2023-08-12 MED ORDER — ENOXAPARIN SODIUM 40 MG/0.4ML IJ SOSY
40.0000 mg | PREFILLED_SYRINGE | INTRAMUSCULAR | Status: DC
  Administered 2023-08-12: 40 mg via SUBCUTANEOUS
  Filled 2023-08-12: qty 0.4

## 2023-08-12 MED ORDER — VANCOMYCIN HCL IN DEXTROSE 1-5 GM/200ML-% IV SOLN
1000.0000 mg | INTRAVENOUS | Status: DC
Start: 2023-08-13 — End: 2023-08-13
  Administered 2023-08-13: 1000 mg via INTRAVENOUS
  Filled 2023-08-12: qty 200

## 2023-08-12 MED ORDER — SODIUM CHLORIDE 0.9% FLUSH
3.0000 mL | Freq: Two times a day (BID) | INTRAVENOUS | Status: DC
  Administered 2023-08-12 – 2023-08-18 (×11): 3 mL via INTRAVENOUS

## 2023-08-12 MED ORDER — LACTATED RINGERS IV BOLUS (SEPSIS)
1000.0000 mL | Freq: Once | INTRAVENOUS | Status: AC
  Administered 2023-08-12: 1000 mL via INTRAVENOUS

## 2023-08-12 MED ORDER — VANCOMYCIN HCL IN DEXTROSE 1-5 GM/200ML-% IV SOLN: 1000.0000 mg | Freq: Once | INTRAVENOUS | Status: DC

## 2023-08-12 MED ORDER — IOHEXOL 350 MG/ML SOLN
75.0000 mL | Freq: Once | INTRAVENOUS | Status: AC | PRN
  Administered 2023-08-12: 75 mL via INTRAVENOUS

## 2023-08-12 MED ORDER — ACETAMINOPHEN 650 MG RE SUPP
650.0000 mg | Freq: Four times a day (QID) | RECTAL | Status: DC | PRN
  Administered 2023-08-13: 650 mg via RECTAL
  Filled 2023-08-12: qty 1

## 2023-08-12 MED ORDER — SODIUM CHLORIDE 0.9 % IV SOLN: 2.0000 g | Freq: Two times a day (BID) | INTRAVENOUS | Status: DC

## 2023-08-12 MED ORDER — POLYETHYLENE GLYCOL 3350 17 G PO PACK: 17.0000 g | PACK | Freq: Every day | ORAL | Status: DC | PRN

## 2023-08-12 MED ORDER — METRONIDAZOLE 500 MG/100ML IV SOLN
500.0000 mg | Freq: Two times a day (BID) | INTRAVENOUS | Status: DC
  Administered 2023-08-12 – 2023-08-15 (×6): 500 mg via INTRAVENOUS
  Filled 2023-08-12 (×6): qty 100

## 2023-08-12 MED ORDER — SODIUM CHLORIDE 0.9 % IV SOLN
2.0000 g | Freq: Once | INTRAVENOUS | Status: AC
  Administered 2023-08-12: 2 g via INTRAVENOUS
  Filled 2023-08-12: qty 12.5

## 2023-08-12 MED ORDER — SODIUM CHLORIDE 0.9 % IV SOLN
2.0000 g | Freq: Two times a day (BID) | INTRAVENOUS | Status: DC
  Administered 2023-08-12 – 2023-08-14 (×5): 2 g via INTRAVENOUS
  Filled 2023-08-12 (×6): qty 20

## 2023-08-12 NOTE — Procedures (Signed)
 Patient Name: Steven Meadows  MRN: [id number]  Epilepsy Attending: Charlsie Quest  Referring Physician/Provider: Synetta Fail, MD  Date: 08/12/2023 Duration: 24.22 mins  Patient history: 89yo M with ams. EEG to evaluate for seizure  Level of alertness: Awake/ lethargic  AEDs during EEG study: None  Technical aspects: This EEG study was done with scalp electrodes positioned according to the 10-20 International system of electrode placement. Electrical activity was reviewed with band pass filter of 1-70Hz , sensitivity of 7 uV/mm, display speed of 85mm/sec with a 60Hz  notched filter applied as appropriate. EEG data were recorded continuously and digitally stored.  Video monitoring was available and reviewed as appropriate.  Description: EEG showed continuous generalized 3 to 6 Hz theta-delta slowing. Hyperventilation and photic stimulation were not performed.     ABNORMALITY - Continuous slow, generalized  IMPRESSION: This study is suggestive of moderate diffuse encephalopathy. No seizures or epileptiform discharges were seen throughout the recording.  [name]

## 2023-08-12 NOTE — Progress Notes (Signed)
 Orthopedic Tech Progress Note Patient Details:  Steven Meadows 12/03/1931 [id number] Level 2 Trauma  Patient ID: [name], male   DOB: 1932-01-11, 89 y.o.   MRN: [id number]  [name] 08/12/2023, 10:31 AM

## 2023-08-12 NOTE — ED Triage Notes (Signed)
 Pt found down, unwitnessed fall. LSN 9:30 pm last night. Pt has head laceration to forehead. Pt arrives in c-collar. T is on elliquis.

## 2023-08-12 NOTE — H&P (Signed)
 History and Physical   ZEPH RIEBEL WUJ:[id number] DOB: [birth year] DOA: 08/12/2023  PCP: Marden Noble, MD (Inactive)   Patient coming from: Assisted living facility  Chief Complaint: Unresponsive  HPI: Steven Meadows is a 89 y.o. male with medical history significant of hypertension, hyperlipidemia, atrial fibrillation, stroke, intracerebral hemorrhage, renal disease presenting after being found unresponsive.  Patient recently moved to assisted living last week after progressive weakness.  He was found facedown this morning and was unresponsive.  Patient was last seen normal a few hours earlier around 6 or 7 AM by other staff.  EMS was called, took a while to roll patient over and his arms were under him.  He was initially hypoxic to 84% on room air which improved on nasal cannula.  Transferred to ED.  Unable to obtain review of systems due to patient's altered mentation.  ED Course: Vital signs in the ED notable for temperature of 97.4, blood pressure in the 170 systolic, heart rate in the 90s-100s, respiratory rate in the teens-'s 20s.  Lab workup included CMP with sodium 133, chloride 97, bicarb 18, gap 18, creatinine 1.5 up from baseline of 1, glucose 169, AST 49.  CBC with leukocytosis to 21.4.  PT and INR elevated at 16 and 1.3 respectively.  PTT elevated to 38.  CK elevated to 3367.  Lactic acid initially elevated at 3.5 with repeat pending.  Respiratory panel for flu COVID and RSV pending.  UDS, urinalysis pending.  Ethanol level negative.  Blood cultures pending.  CT head showed no acute normality.  CT spine showed no acute normality.  Chest x-ray showed no acute abnormality.  Pelvis x-ray showed no acute abnormality.  Patient covered with broad-spectrum antibiotics including vancomycin, cefepime, Flagyl in the ED.  Also received 1 L IV fluids and was started on a rate 150 cc an hour.  Patient was initially significantly altered to the point where he would have been intubated had he not  been DNR/DNI.  Has had some mild improvement in the ED.  Review of Systems: All to be obtained secondary to patient's altered mentation.  Past Medical History:  Diagnosis Date   Abdominal aortic atherosclerosis (HCC)    Actinic keratoses    AKI (acute kidney injury) (HCC)    Aneurysm of vertebral artery (HCC)    Atrial fibrillation (HCC)    Cerebrovascular accident (CVA) due to embolism of right posterior cerebral artery (HCC) 07/25/2015   Cough    History of mononucleosis    HTN (hypertension)    Hypercholesteremia    Lacunar stroke (HCC)    Low back pain    Olecranon bursitis    Rotator cuff tear    [facility]    Stroke with cerebral ischemia [facility])    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 He has Steven used smokeless tobacco. He reports current alcohol use. He reports that he does not use drugs.  Allergies  Allergen Reactions   Nabumetone Other (See Comments)    Gi upset   Ace Inhibitors Cough   Flonase [Fluticasone Propionate] Other (See Comments)    Causes nosebleeds    Family History  Problem Relation Age of Onset   Heart attack Paternal Grandfather    Heart attack Maternal Grandfather    Cancer Paternal Grandmother  Healthy Mother    Transient ischemic attack Mother    Healthy Father    Healthy Sister   Reviewed on admission  Prior to Admission medications   Medication Sig Start Date End Date Taking? Authorizing Provider  apixaban (ELIQUIS) 5 MG TABS tablet Take 1 tablet (5 mg total) by mouth 2 (two) times daily. 12/09/20  Yes Amin, Ankit C, MD  Cholecalciferol (VITAMIN D3) 50 MCG (2000 UT) TABS Take 2,000 Units by mouth every morning.   Yes [provider]  cyanocobalamin (VITAMIN B12) 1000 MCG tablet Take 1,000 mcg by mouth every other day.   Yes [provider]  diltiazem (CARDIZEM CD) 240 MG 24 hr capsule Take 1 capsule (240 mg total) by mouth daily. Patient taking differently: Take 240 mg by mouth every morning. 05/14/20  Yes Angiulli, Mcarthur Rossetti, PA-C  hydrochlorothiazide (HYDRODIURIL) 12.5 MG tablet Take 12.5 mg by mouth every morning. 08/04/23  Yes [provider]  pantoprazole (PROTONIX) 40 MG tablet Take 40 mg by mouth daily.   Yes [provider]  rosuvastatin (CRESTOR) 5 MG tablet Take 5 mg by mouth every morning. 08/04/23  Yes [provider]  sertraline (ZOLOFT) 50 MG tablet Take 50 mg by mouth every morning. 08/04/23  Yes [provider]  valsartan (DIOVAN) 80 MG tablet Take 80 mg by mouth every evening. 08/04/23  Yes [provider]  acetaminophen (TYLENOL) 325 MG tablet Take 2 tablets (650 mg total) by mouth every 4 (four) hours as needed for mild pain (or temp > 37.5 C (99.5 F)). Patient not taking: Reported on 08/12/2023 05/14/20   Angiulli, Mcarthur Rossetti, PA-C  ezetimibe (ZETIA) 10 MG tablet Take 1 tablet (10 mg total) by mouth daily. Patient not taking: Reported on 08/12/2023 05/14/20   Angiulli, Mcarthur Rossetti, PA-C  hydrALAZINE (APRESOLINE) 25 MG tablet Take 1 tablet (25 mg total) by mouth every 8 (eight) hours. Patient not taking: Reported on 08/12/2023 05/14/20   Charlton Amor, PA-C    Physical Exam: Vitals:   08/12/23 1200 08/12/23 1210 08/12/23 1315 08/12/23 1330  BP: (!) 170/98  (!) 162/92 (!) 164/113  Pulse: 91  99 97  Resp: 18  20 18   Temp:  (!) 97.4 F (36.3 C)    TempSrc:  Rectal    SpO2: 99%  98% 98%    Physical Exam Constitutional:      General: He is not in acute distress.    Appearance: Normal appearance.  HENT:     Head: Normocephalic and atraumatic.     Mouth/Throat:     Mouth: Mucous membranes are moist.     Pharynx: Oropharynx is clear.  Eyes:     Extraocular Movements: Extraocular movements intact.     Pupils: Pupils are equal, round, and reactive to light.   Cardiovascular:     Rate and Rhythm: Normal rate and regular rhythm.     Pulses: Normal pulses.     Heart sounds: Normal heart sounds.  Pulmonary:     Effort: Pulmonary effort is normal. No respiratory distress.     Breath sounds: Normal breath sounds.  Abdominal:     General: Bowel sounds are normal. There is no distension.     Palpations: Abdomen is soft.     Tenderness: There is no abdominal tenderness.  Musculoskeletal:        General: No swelling or deformity.  Skin:    General: Skin is warm and dry.  Neurological:     Comments: Global  deficits / encephalopathy. Moving all extremities intermittently, difficult to judge if equal. Not following commands. Opened eyes once.    Labs on Admission: I have personally reviewed following labs and imaging studies  CBC: Recent Labs  Lab 08/12/23 1037 08/12/23 1044  WBC 21.4*  --   NEUTROABS 17.4*  --   HGB 15.7 16.7  HCT 48.0 49.0  MCV 87.6  --   PLT 246  --     Basic Metabolic Panel: Recent Labs  Lab 08/12/23 1037 08/12/23 1044  NA 133* 132*  K 4.9 5.0  CL 97* 100  CO2 18*  --   GLUCOSE 169* 171*  BUN 15 17  CREATININE 1.50* 1.50*  CALCIUM 9.1  --     GFR: CrCl cannot be calculated (Unknown ideal weight.).  Liver Function Tests: Recent Labs  Lab 08/12/23 1037  AST 49*  ALT 31  ALKPHOS 31*  BILITOT 0.5  PROT 7.8  ALBUMIN 4.2    Urine analysis:    Component Value Date/Time   COLORURINE STRAW (A) 12/07/2020 1522   APPEARANCEUR CLEAR 12/07/2020 1522   LABSPEC 1.005 12/07/2020 1522   PHURINE 7.0 12/07/2020 1522   GLUCOSEU NEGATIVE 12/07/2020 1522   HGBUR NEGATIVE 12/07/2020 1522   BILIRUBINUR NEGATIVE 12/07/2020 1522   KETONESUR NEGATIVE 12/07/2020 1522   PROTEINUR NEGATIVE 12/07/2020 1522   UROBILINOGEN 0.2 01/05/2009 0535   NITRITE NEGATIVE 12/07/2020 1522   LEUKOCYTESUR NEGATIVE 12/07/2020 1522    Radiological Exams on Admission: DG Chest Portable 1 View Result Date: 08/12/2023 CLINICAL  DATA:  Trauma. EXAM: PORTABLE CHEST 1 VIEW COMPARISON:  April 30, 2020. FINDINGS: Stable cardiomediastinal silhouette. Both lungs are clear. The visualized skeletal structures are unremarkable. IMPRESSION: No active disease. Electronically Signed   By: Lupita Raider M.D.   On: 08/12/2023 12:44   DG Pelvis Portable Result Date: 08/12/2023 CLINICAL DATA:  Trauma. EXAM: PORTABLE PELVIS 1-2 VIEWS COMPARISON:  None Available. FINDINGS: No acute fracture or dislocation. The bones are osteopenic. Mild bilateral hip arthritic changes. The soft tissues are unremarkable. IMPRESSION: 1. No acute fracture or dislocation. 2. Osteopenia. Electronically Signed   By: Elgie Collard M.D.   On: 08/12/2023 12:42   CT Head Wo Contrast Result Date: 08/12/2023 CLINICAL DATA:  Ataxia, head trauma; Ataxia, cervical trauma EXAM: CT HEAD WITHOUT CONTRAST CT CERVICAL SPINE WITHOUT CONTRAST TECHNIQUE: Multidetector CT imaging of the head and cervical spine was performed following the standard protocol without intravenous contrast. Multiplanar CT image reconstructions of the cervical spine were also generated. RADIATION DOSE REDUCTION: This exam was performed according to the departmental dose-optimization program which includes automated exposure control, adjustment of the mA and/or kV according to patient size and/or use of iterative reconstruction technique. COMPARISON:  MRI head from 12/07/2020, CT scan head from 12/07/2020 and CT angiography neck from 04/30/2020. FINDINGS: CT HEAD FINDINGS Brain: No evidence of acute infarction, hemorrhage, hydrocephalus, extra-axial collection or mass lesion/mass effect. There is bilateral periventricular hypodensity, which is non-specific but most likely seen in the settings of microvascular ischemic changes. Moderate in extent. Redemonstration of chronic infarct in the midline/right paramedian pons, similar to the prior study. Otherwise normal appearance of brain parenchyma. Cerebral volume  loss with enlargement of the ventricles. Vascular: No hyperdense vessel or unexpected calcification. Intracranial arteriosclerosis. Skull: Normal. Negative for fracture or focal lesion. Sinuses/Orbits: No acute finding. Other: Visualized mastoid air cells are unremarkable. No mastoid effusion. CT CERVICAL SPINE FINDINGS Alignment: There is grade 1 anterolisthesis of C5 over C6, most likely  degenerative. This examination does not assess for ligamentous injury or stability. Skull base and vertebrae: No acute fracture. No primary bone lesion or focal pathologic process. Soft tissues and spinal canal: No prevertebral fluid or swelling. No visible canal hematoma. Disc levels: Moderate multilevel degenerative changes characterized by reduced intervertebral disc height (most pronounced at C6-7 level), along with moderate multilevel facet arthropathy and mild marginal osteophyte formation. Upper chest: Negative. Others: There is a 9 x 10 mm hypoattenuating nodule in the right thyroid lobe, incompletely characterized on the current exam. However, nodule does not meet the size criteria for follow-up ultrasound evaluation. IMPRESSION: *No acute intracranial abnormality. *No acute osseous injury of the cervical spine. Electronically Signed   By: Jules Schick M.D.   On: 08/12/2023 11:12   CT Cervical Spine Wo Contrast Result Date: 08/12/2023 CLINICAL DATA:  Ataxia, head trauma; Ataxia, cervical trauma EXAM: CT HEAD WITHOUT CONTRAST CT CERVICAL SPINE WITHOUT CONTRAST TECHNIQUE: Multidetector CT imaging of the head and cervical spine was performed following the standard protocol without intravenous contrast. Multiplanar CT image reconstructions of the cervical spine were also generated. RADIATION DOSE REDUCTION: This exam was performed according to the departmental dose-optimization program which includes automated exposure control, adjustment of the mA and/or kV according to patient size and/or use of iterative reconstruction  technique. COMPARISON:  MRI head from 12/07/2020, CT scan head from 12/07/2020 and CT angiography neck from 04/30/2020. FINDINGS: CT HEAD FINDINGS Brain: No evidence of acute infarction, hemorrhage, hydrocephalus, extra-axial collection or mass lesion/mass effect. There is bilateral periventricular hypodensity, which is non-specific but most likely seen in the settings of microvascular ischemic changes. Moderate in extent. Redemonstration of chronic infarct in the midline/right paramedian pons, similar to the prior study. Otherwise normal appearance of brain parenchyma. Cerebral volume loss with enlargement of the ventricles. Vascular: No hyperdense vessel or unexpected calcification. Intracranial arteriosclerosis. Skull: Normal. Negative for fracture or focal lesion. Sinuses/Orbits: No acute finding. Other: Visualized mastoid air cells are unremarkable. No mastoid effusion. CT CERVICAL SPINE FINDINGS Alignment: There is grade 1 anterolisthesis of C5 over C6, most likely degenerative. This examination does not assess for ligamentous injury or stability. Skull base and vertebrae: No acute fracture. No primary bone lesion or focal pathologic process. Soft tissues and spinal canal: No prevertebral fluid or swelling. No visible canal hematoma. Disc levels: Moderate multilevel degenerative changes characterized by reduced intervertebral disc height (most pronounced at C6-7 level), along with moderate multilevel facet arthropathy and mild marginal osteophyte formation. Upper chest: Negative. Others: There is a 9 x 10 mm hypoattenuating nodule in the right thyroid lobe, incompletely characterized on the current exam. However, nodule does not meet the size criteria for follow-up ultrasound evaluation. IMPRESSION: *No acute intracranial abnormality. *No acute osseous injury of the cervical spine. Electronically Signed   By: Jules Schick M.D.   On: 08/12/2023 11:12   EKG: Independently reviewed.  Atrial fibrillation at 93  beats minute.  Nonspecific T wave changes.  PVCs.  Right bundle branch block.  Similar to previous.  Assessment/Plan Principal Problem:   Acute encephalopathy Active Problems:   History of CVA (cerebrovascular accident)   Chronic atrial fibrillation (HCC)   HLD (hyperlipidemia)   Essential hypertension   Acute encephalopathy > Unclear etiology.  Patient found unresponsive by staff at assisted living facility this morning after being noted to be normal by other staff between 6 and 7 AM. > Was laying on his arms and was initially hypoxic to 84% when found by EMS.  Has been  globally unresponsive in the ED and would have been intubated had not been DNI.  Mild improvement in the ED but remains unresponsive, does point more towards postictal/seizure etiology. > Clarified the patient is DNR/DNI.  Has MOST form indicating DNR/DNI. Wants antibiotics and IV fluids. - Monitor and progressive for now - Continue with broad-spectrum antibiotics as below - Continue with IV fluids - EEG - MRI brain  - Seizure precautions - Neurology consultation  ?Sepsis > Likely etiology for encephalopathy as above.  Possible sepsis given temperature 97.4, tachycardia, tachypnea, leukocytosis 21.4. > Initial lactic acid at 3.5 with repeat pending. > No source identified thus far with normal chest x-ray.  Urinalysis and blood cultures pending. > Consideration being given to LP though without fever and abrupt onset this may be less likely especially considering his improvement thus far in the ED.  And with his DNR/DNI and comfort measures he may not want invasive procedures, at this time EDP uncomfortable with LP in the setting of anticoagulant use. - Continue with broad-spectrum antibiotics - Trend lactic acid - IV fluids - Supportive care  History of CVA - Repeat MRI brain as above - Holding any antihypertensives until MRI brain is done, unable to tolerate due to altered mentation at this time  anyway  Hypertension - Holding antihypertensives  Hyperlipidemia - Holding home Zetia and rosuvastatin  DVT prophylaxis: Prophylactic Lovenox for now (on Eliquis at home, but holding in case of LP and because n.p.o.) Code Status:   DNR/DNI Family Communication:  Updated at bedside  Disposition Plan:   Patient is from:  Assisted living facility  Anticipated DC to:  Pending clinical course  Anticipated DC date:  3 to 5 days  Anticipated DC barriers: None  Consults called:  None Admission status:  Inpatient, progressive  Severity of Illness: The appropriate patient status for this patient is INPATIENT. Inpatient status is judged to be reasonable and necessary in order to provide the required intensity of service to ensure the patient's safety. The patient's presenting symptoms, physical exam findings, and initial radiographic and laboratory data in the context of their chronic comorbidities is felt to place them at high risk for further clinical deterioration. Furthermore, it is not anticipated that the patient will be medically stable for discharge from the hospital within 2 midnights of admission.   * I certify that at the point of admission it is my clinical judgment that the patient will require inpatient hospital care spanning beyond 2 midnights from the point of admission due to high intensity of service, high risk for further deterioration and high frequency of surveillance required.Synetta Fail MD [facility]  How to contact the [facility] Attending or Consulting provider 7A - 7P or covering provider during after hours 7P -7A, for this patient?   Check the care team in [facility] and look for a) attending/consulting [facility] provider listed and b) the [facility] team listed Log into [email or web address] and use [facility]'s universal password to access. If you do not have the password, please contact the hospital operator. Locate the [facility] provider you are looking for under [facility] and  page to a number that you can be directly reached. If you still have difficulty reaching the provider, please page the [facility] (Director on Call) for the Hospitalists listed on amion for assistance.  08/12/2023, 1:45 PM

## 2023-08-12 NOTE — ED Notes (Signed)
 MD at bedside.

## 2023-08-12 NOTE — ED Notes (Signed)
 Patient transported to CT

## 2023-08-12 NOTE — Progress Notes (Signed)
 PHARMACY - ANTICOAGULATION CONSULT NOTE  Pharmacy Consult for heparin Indication: atrial fibrillation (on Eliquis PTA)  Allergies  Allergen Reactions   Nabumetone Other (See Comments)    Gi upset   Ace Inhibitors Cough   Flonase [Fluticasone Propionate] Other (See Comments)    Causes nosebleeds    Patient Measurements:    Vital Signs: Temp: 98.3 F (36.8 C) (04/09 1725) Temp Source: Temporal (04/09 1725) BP: 172/88 (04/09 1800) Pulse Rate: 60 (04/09 1800)  Labs: Recent Labs    08/12/23 1037 08/12/23 1044  HGB 15.7 16.7  HCT 48.0 49.0  PLT 246  --   APTT 38*  --   LABPROT 16.0*  --   INR 1.3*  --   CREATININE 1.50* 1.50*  CKTOTAL 3,367*  --     CrCl cannot be calculated (Unknown ideal weight.).   Medical History: Past Medical History:  Diagnosis Date   Abdominal aortic atherosclerosis (HCC)    Actinic keratoses    AKI (acute kidney injury) (HCC)    Aneurysm of vertebral artery (HCC)    Atrial fibrillation (HCC)    Cerebrovascular accident (CVA) due to embolism of right posterior cerebral artery (HCC) 07/25/2015   Cough    History of mononucleosis    HTN (hypertension)    Hypercholesteremia    Lacunar stroke (HCC)    Low back pain    Olecranon bursitis    Rotator cuff tear    Stroke [facility])    Stroke with cerebral ischemia (HCC)    Thalamic hemorrhage (HCC) 05/03/2020    Medications:  (Not in a hospital admission)  Scheduled:   enoxaparin (LOVENOX) injection  40 mg Subcutaneous Q24H   sodium chloride flush  3 mL Intravenous Q12H    Assessment: 89 YOM presenting with acute encephalopathy and concern for sepsis of unknown source of infection. Patient has a PMH significant for atrial fibrillation on Eliquis PTA, last dose given 4/8 PM. Per MD, patient may need a lumbar puncture, plan to hold Eliquis and transition patient to heparin. Pharmacy consulted to assist with heparin dosing. Due to recent Eliquis administration, will monitor aPTT and anti-Xa  levels until correlating.   4/9: Patient received dose of lovenox 40 mg in the ED. Hgb 16.7, PLT 246.  Goal of Therapy:  Heparin level 0.3-0.7 units/ml aPTT 66-102 seconds Monitor platelets by anticoagulation protocol: Yes   Plan:  Heparin 1200 units/hr, no bolus due to recent lovenox administration Monitor aPTT and anti-Xa level in 8 hours and daily until correlating  Monitor CBC and s/sx of bleeding daily F/u when to restart Eliquis  Enos Fling, PharmD PGY-1 Acute Care Pharmacy Resident 08/12/2023 7:51 PM

## 2023-08-12 NOTE — Progress Notes (Signed)
 Pharmacy Antibiotic Note  Steven Meadows is a 89 y.o. male admitted on 08/12/2023 with sepsis of unknown source of infection. Pharmacy has been consulted for vancomycin and cefepime dosing. Patient received vancomycin 2000 mg IV x1, cefepime, and metronidazole in the ED.   4/9: WBC 21.4, lactate 3.5, Scr 1.50, Temperature 36.3C  Plan: Vancomycin 1000 mg IV every 24 hours (eAUC 455, Scr 1.5, IBW) Cefepime 2 gm IV every 12 hours Metronidazole per MD Monitor renal function and vancomycin levels as appropriate Monitor clinical response and de-escalate antibiotics as appropriate    Temp (24hrs), Avg:97.3 F (36.3 C), Min:97.2 F (36.2 C), Max:97.4 F (36.3 C)  Recent Labs  Lab 08/12/23 1037 08/12/23 1044 08/12/23 1200  WBC 21.4*  --   --   CREATININE 1.50* 1.50*  --   LATICACIDVEN  --   --  3.5*    CrCl cannot be calculated (Unknown ideal weight.).    Allergies  Allergen Reactions   Nabumetone Other (See Comments)    Gi upset   Ace Inhibitors Cough   Flonase [Fluticasone Propionate] Other (See Comments)    Causes nosebleeds    Antimicrobials this admission: Vancomycin 4/9 >> Cefepime 4/9 >> Metronidazole 4/9 >>  Microbiology results: 4/9 Bcx: 4/9 RVP:  Thank you for allowing pharmacy to be a part of this patient's care.  Enos Fling, PharmD PGY-1 Acute Care Pharmacy Resident 08/12/2023 1:54 PM

## 2023-08-12 NOTE — Progress Notes (Signed)
 4/9 Elink is following code sepsis.

## 2023-08-12 NOTE — Consult Note (Signed)
 NEUROLOGY CONSULT NOTE   Date of service: August 12, 2023 Patient Name: Steven Meadows MRN:  [id number] DOB:  Jul 09, 1931 Chief Complaint: "unresponsive" Requesting Provider: Synetta Fail, MD  History of Present Illness  RAMONDO DIETZE is a 89 y.o. male with hx of CVA secondary to embolism of R PCA 2017, Thalamic Hemorrhage 2021, aneurysm of Vertebral Artery, Afib on Eliquis, recent progressive generalized weakness, HTN, HLD who came in as a Level 2 Trauma after being found down after an unwitnessed fall, forehead laceration seen on admission. LKW was between 1800-1900 4/8 per facility staff. He was found on the floor face down with his arms pinned underneath him. Per EMS, he was hypoxic (84%) on their arrival with improvement after [place].  Patient was unresponsive with gurgling respirations, hypothermic (97.4) on ED arrival. Elevated WBC, lactate with evidence of dehydration and AKI, possibly rhabdomyolysis. He was started on empiric antibiotic coverage and given fluids.  Neurology consulted for possible seizure/encephalopathy presentation.   CTH negative. EEG negative. MRI Brain pending.   On neurology exam, patient responds to voice. He answers "[facility]" (his facility) when asked where he is. He is able to tell me his full birthday, but unable to tell me his wife's or daughter's name. He has BLE neuropathy at baseline per wife at bedside. He has spontaneous movement of his RUE with antigravity strength. He resists against passive movement of his LUE. He withdraws BLE, and wiggles his toes slightly to command x1. He does not follow any other commands. He has intermittent pursed-lip breathing and is in Afib in the 100-110 range on the monitor.   Per facility records, patient is DNR status. This was confirmed by EDP speaking with patients daughter and wife.     ROS   Unable to ascertain due to AMS  Past History   Past Medical History:  Diagnosis Date   Abdominal aortic atherosclerosis  (HCC)    Actinic keratoses    AKI (acute kidney injury) (HCC)    Aneurysm of vertebral artery (HCC)    Atrial fibrillation (HCC)    Cerebrovascular accident (CVA) due to embolism of right posterior cerebral artery (HCC) 07/25/2015   Cough    History of mononucleosis    HTN (hypertension)    Hypercholesteremia    Lacunar stroke (HCC)    Low back pain    Olecranon bursitis    Rotator cuff tear    Stroke [facility])    Stroke with cerebral ischemia [facility])    Thalamic hemorrhage (HCC) 05/03/2020    Past Surgical History:  Procedure Laterality Date   CATARACT EXTRACTION--right eye  2013   TONSILLECTOMY AND ADENOIDECTOMY      Family History: Family History  Problem Relation Age of Onset   Heart attack Paternal Grandfather    Heart attack Maternal Grandfather    Cancer Paternal Grandmother    Healthy Mother    Transient ischemic attack Mother    Healthy Father    Healthy Sister     Social History  reports that he has never smoked. He has never used smokeless tobacco. He reports current alcohol use. He reports that he does not use drugs.  Allergies  Allergen Reactions   Nabumetone Other (See Comments)    Gi upset   Ace Inhibitors Cough   Flonase [Fluticasone Propionate] Other (See Comments)    Causes nosebleeds    Medications   Current Facility-Administered Medications:    acetaminophen (TYLENOL) tablet 650 mg, 650 mg, Oral, Q6H PRN **OR** acetaminophen (  TYLENOL) suppository 650 mg, 650 mg, Rectal, Q6H PRN, Synetta Fail, MD   [START ON 08/13/2023] ceFEPIme (MAXIPIME) 2 g in sodium chloride 0.9 % 100 mL IVPB, 2 g, Intravenous, Q12H, Katsaros, Marcia Brash, RPH   enoxaparin (LOVENOX) injection 40 mg, 40 mg, Subcutaneous, Q24H, Synetta Fail, MD   lactated ringers infusion, , Intravenous, Continuous, Synetta Fail, MD, Last Rate: 150 mL/hr at 08/12/23 1336, New Bag at 08/12/23 1336   LORazepam (ATIVAN) injection 2 mg, 2 mg, Intravenous, Once PRN, Synetta Fail, MD   metroNIDAZOLE (FLAGYL) IVPB 500 mg, 500 mg, Intravenous, Q12H, Synetta Fail, MD   polyethylene glycol (MIRALAX / GLYCOLAX) packet 17 g, 17 g, Oral, Daily PRN, Synetta Fail, MD   sodium chloride flush (NS) 0.9 % injection 3 mL, 3 mL, Intravenous, Q12H, Synetta Fail, MD   [START ON 08/13/2023] vancomycin (VANCOCIN) IVPB 1000 mg/200 mL premix, 1,000 mg, Intravenous, Q24H, Katsaros, Marcia Brash, RPH  Current Outpatient Medications:    apixaban (ELIQUIS) 5 MG TABS tablet, Take 1 tablet (5 mg total) by mouth 2 (two) times daily., Disp: 60 tablet, Rfl: 0   Cholecalciferol (VITAMIN D3) 50 MCG (2000 UT) TABS, Take 2,000 Units by mouth every morning., Disp: , Rfl:    cyanocobalamin (VITAMIN B12) 1000 MCG tablet, Take 1,000 mcg by mouth every other day., Disp: , Rfl:    diltiazem (CARDIZEM CD) 240 MG 24 hr capsule, Take 1 capsule (240 mg total) by mouth daily. (Patient taking differently: Take 240 mg by mouth every morning.), Disp: 30 capsule, Rfl: 0   hydrochlorothiazide (HYDRODIURIL) 12.5 MG tablet, Take 12.5 mg by mouth every morning., Disp: , Rfl:    pantoprazole (PROTONIX) 40 MG tablet, Take 40 mg by mouth daily., Disp: , Rfl:    rosuvastatin (CRESTOR) 5 MG tablet, Take 5 mg by mouth every morning., Disp: , Rfl:    sertraline (ZOLOFT) 50 MG tablet, Take 50 mg by mouth every morning., Disp: , Rfl:    valsartan (DIOVAN) 80 MG tablet, Take 80 mg by mouth every evening., Disp: , Rfl:    acetaminophen (TYLENOL) 325 MG tablet, Take 2 tablets (650 mg total) by mouth every 4 (four) hours as needed for mild pain (or temp > 37.5 C (99.5 F)). (Patient not taking: Reported on 08/12/2023), Disp: , Rfl:    ezetimibe (ZETIA) 10 MG tablet, Take 1 tablet (10 mg total) by mouth daily. (Patient not taking: Reported on 08/12/2023), Disp: 30 tablet, Rfl: 0   hydrALAZINE (APRESOLINE) 25 MG tablet, Take 1 tablet (25 mg total) by mouth every 8 (eight) hours. (Patient not taking: Reported on 08/12/2023),  Disp: 90 tablet, Rfl: 0  Vitals   Vitals:   08/12/23 1345 08/12/23 1400 08/12/23 1415 08/12/23 1430  BP: (!) 160/92 (!) 171/105 (!) 164/92   Pulse: 97 92 83   Resp: 20 18 (!) 21   Temp:    97.9 F (36.6 C)  TempSrc:    Temporal  SpO2: 97% 97% 94%     There is no height or weight on file to calculate BMI.  Physical Exam   Constitutional: Appears elderly and acutely ill.  Head: Normocephalic. Right forehead laceration. Cardiovascular: Irregular, Irregular Respiratory: Irregular breathing, pursed-lip GI: Soft.  No distension.  GU: Foley in place with dark urine.  Skin: Right forehead laceration  Neurologic Examination   Patient is obtunded.  He responds to voice and is able to answer where he is and his birthday.  He  resists against passive eye opening and does not open eyes to voice or noxious stimuli.   RUE: Spontaneous movement LUE: Resists against passive movement BLE: withdraw slightly to noxious stimuli only.  Labs/Imaging/Neurodiagnostic studies   CBC:  Recent Labs  Lab 08/20/2023 1037 08-20-23 1044  WBC 21.4*  --   NEUTROABS 17.4*  --   HGB 15.7 16.7  HCT 48.0 49.0  MCV 87.6  --   PLT 246  --    Basic Metabolic Panel:  Lab Results  Component Value Date   NA 132 (L) Aug 20, 2023   K 5.0 08-20-2023   CO2 18 (L) 08-20-23   GLUCOSE 171 (H) 08-20-23   BUN 17 2023-08-20   CREATININE 1.50 (H) 08-20-2023   CALCIUM 9.1 08-20-2023   GFRNONAA 44 (L) 2023/08/20   GFRAA 61 08/18/2019   Lipid Panel:  Lab Results  Component Value Date   LDLCALC 98 12/08/2020   HgbA1c:  Lab Results  Component Value Date   HGBA1C 6.0 (H) 12/08/2020   Urine Drug Screen:     Component Value Date/Time   LABOPIA NONE DETECTED 08-20-2023 1300   COCAINSCRNUR NONE DETECTED 2023/08/20 1300   LABBENZ NONE DETECTED 20-Aug-2023 1300   AMPHETMU NONE DETECTED August 20, 2023 1300   THCU NONE DETECTED 2023/08/20 1300   LABBARB NONE DETECTED 08/20/23 1300    Alcohol Level      Component Value Date/Time   ETH <10 08-20-2023 1155   INR  Lab Results  Component Value Date   INR 1.3 (H) 08-20-23   APTT  Lab Results  Component Value Date   APTT 38 (H) 2023-08-20   AED levels: No results found for: "PHENYTOIN", "ZONISAMIDE", "LAMOTRIGINE", "LEVETIRACETA"  CT Head without contrast(Personally reviewed): No acute intracranial abnormality.   MRI Brain(Personally reviewed): PENDING  Neurodiagnostics rEEG:  This study is suggestive of moderate diffuse encephalopathy. No seizures or epileptiform discharges were seen throughout the recording.   ASSESSMENT  ALEXSANDRO SALEK is a 89 y.o. male with hx of CVA secondary to embolism of R PCA 2017, Thalamic Hemorrhage 2021, aneurysm of Vertebral Artery, Afib on Eliquis, recent progressive generalized weakness, HTN, HLD who came in as a Level 2 Trauma after being found down. Per EMS, hypoxic (84%) on their. arrival  LKW was between 1800-1900 4/8 per facility staff.  However, wife stated she spoke to him around 2100 and he sounded like he was in his usual state.   He is hypothermic, with elevated lactate, leukocytosis, evidence of dehydration, AKI and possibly rhabdomyolysis. He was started on empiric antibiotic coverage and given fluids.   CTH negative. EEG negative. MRI Brain pending.   On neurology exam, patient responds to voice. He answers "[facility]" (his facility) when asked where he is. He is able to tell me his full birthday. He has spontaneous movement of his RUE with antigravity strength. He resists against passive movement of his LUE. He withdraws BLE, and wiggles his toes slightly to command x1. He does not follow any other commands.   Stroke versus Seizure/Toxic-Metabolic Encephalopathy secondary to infection/sepsis (leukocytosis, positive UA, elevated LA), AKI versus Seizure versus prolonged postictal state from a seizure due to prior strokes and poor brain reserve.    RECOMMENDATIONS   - MRI Brain    ______________________________________________________________________   Pt seen by Neuro NP/APP and later by MD. Note/plan to be edited by MD as needed.    Lynnae January, DNP, AGACNP-BC [facility] Please use AMION for contact information & EPIC for messaging.  I  have seen the patient and reviewed the above note.  At the time of my examination, the MRI has been completed and the patient does not have any clear signs of stroke.  On my exam, he has a right gaze preference, he does briefly cross midline to the left.  He also moves the right side spontaneously much more than the left.  On checking passive neck flexion, he does resist movement, not clear that it is definite meningismus but it is also not clearly supple.  Given the profound change in mental status, negative MRI, significant white count, I do think that covering for infectious causes of encephalopathy would be prudent (e.g. empiric meningitis coverage plus acyclovir).  His exam does appear to be improving, and it is possible that we are seeing a postictal state.  On his MRI, I do question subtle diffusion change in the medial temporal region, but this very much could be artifactual.  With persistent focal deficits, no definite lesion on MRI, I think that continuous EEG to rule out intermittent focal seizures would also be prudent.  Unfortunately LP is contraindicated at the current time due to anticoagulation with Eliquis, I will change to heparin in case LP is needed over the next couple of days.  1) overnight EEG 2) empiric CNS coverage 3) antiepileptic only if EEG demonstrates evidence of seizure 4) neurology will follow  Neurology will continue to follow.  Ritta Slot, MD [facility]   If 7pm- 7am, please page neurology on call as listed in AMION.

## 2023-08-12 NOTE — ED Provider Notes (Signed)
 [place] EMERGENCY DEPARTMENT AT [facility] Provider Note   CSN: [id number] Arrival date & time: 08/12/23  1024     History  No chief complaint on file.   Steven Meadows is a 89 y.o. male.  Patient presents unresponsive.  He has a history of atrial fibrillation on Eliquis, hypertension, hyperlipidemia, vertebral artery aneurysm, prior strokes.  He recently moved into [facility].  He was living with his wife and she has not been able to take care of him due to his general weakness.  He does not have dementia per her report.  He moved into [facility] last week.  Per nurse at the assisted living facility, he was found on the floor facedown this morning and EMS was called.  His arms were underneath him and it took them a while to roll him over onto his back.  He was noted to be hypoxic with oxygen saturations of 84% on room air and was placed on nasal cannula by the nursing home and then EMS.  He was last seen by the staff between 6 and 7 and was reportedly at his baseline at that time.  He does have DNR in place.       Home Medications Prior to Admission medications   Medication Sig Start Date End Date Taking? Authorizing Provider  apixaban (ELIQUIS) 5 MG TABS tablet Take 1 tablet (5 mg total) by mouth 2 (two) times daily. 12/09/20  Yes Amin, Ankit C, MD  Cholecalciferol (VITAMIN D3) 50 MCG (2000 UT) TABS Take 2,000 Units by mouth every morning.   Yes [provider]  cyanocobalamin (VITAMIN B12) 1000 MCG tablet Take 1,000 mcg by mouth every other day.   Yes [provider]  diltiazem (CARDIZEM CD) 240 MG 24 hr capsule Take 1 capsule (240 mg total) by mouth daily. Patient taking differently: Take 240 mg by mouth every morning. 05/14/20  Yes Angiulli, Mcarthur Rossetti, PA-C  hydrochlorothiazide (HYDRODIURIL) 12.5 MG tablet Take 12.5 mg by mouth every morning. 08/04/23  Yes [provider]  pantoprazole (PROTONIX) 40 MG tablet Take 40 mg by mouth  daily.   Yes [provider]  rosuvastatin (CRESTOR) 5 MG tablet Take 5 mg by mouth every morning. 08/04/23  Yes [provider]  sertraline (ZOLOFT) 50 MG tablet Take 50 mg by mouth every morning. 08/04/23  Yes [provider]  valsartan (DIOVAN) 80 MG tablet Take 80 mg by mouth every evening. 08/04/23  Yes [provider]  acetaminophen (TYLENOL) 325 MG tablet Take 2 tablets (650 mg total) by mouth every 4 (four) hours as needed for mild pain (or temp > 37.5 C (99.5 F)). Patient not taking: Reported on 08/12/2023 05/14/20   Angiulli, Mcarthur Rossetti, PA-C  ezetimibe (ZETIA) 10 MG tablet Take 1 tablet (10 mg total) by mouth daily. Patient not taking: Reported on 08/12/2023 05/14/20   Angiulli, Mcarthur Rossetti, PA-C  hydrALAZINE (APRESOLINE) 25 MG tablet Take 1 tablet (25 mg total) by mouth every 8 (eight) hours. Patient not taking: Reported on 08/12/2023 05/14/20   Angiulli, Mcarthur Rossetti, PA-C      Allergies    Nabumetone, Ace inhibitors, and Flonase [fluticasone propionate]    Review of Systems   Review of Systems  Unable to perform ROS: Mental status change    Physical Exam Updated Vital Signs BP (!) 164/113   Pulse 97   Temp (!) 97.4 F (36.3 C) (Rectal)   Resp 18   SpO2 98%  Physical Exam  Constitutional:      General: He is in acute distress.  HENT:     Head:     Comments: Abrasions to forehead    Mouth/Throat:     Mouth: Mucous membranes are moist.     Comments: Secretions in the mouth, blood-tinged Neck:     Comments: C-collar in place Cardiovascular:     Rate and Rhythm: Tachycardia present.  Abdominal:     Comments: No significant distention or obvious guarding  Musculoskeletal:     Comments: Some purpleish discoloration to his upper extremities bilaterally from the mid humerus area down, radial pulses intact, there are some abrasions to his left shoulder and left forearm  Skin:    General: Skin is warm and dry.  Neurological:     Comments: Unresponsive      ED Results / Procedures / Treatments   Labs (all labs ordered are listed, but only abnormal results are displayed) Labs Reviewed  COMPREHENSIVE METABOLIC PANEL WITH GFR - Abnormal; Notable for the following components:      Result Value   Sodium 133 (*)    Chloride 97 (*)    CO2 18 (*)    Glucose, Bld 169 (*)    Creatinine, Ser 1.50 (*)    AST 49 (*)    Alkaline Phosphatase 31 (*)    GFR, Estimated 44 (*)    Anion gap 18 (*)    All other components within normal limits  CBC WITH DIFFERENTIAL/PLATELET - Abnormal; Notable for the following components:   WBC 21.4 (*)    Neutro Abs 17.4 (*)    Monocytes Absolute 2.7 (*)    Abs Immature Granulocytes 0.25 (*)    All other components within normal limits  PROTIME-INR - Abnormal; Notable for the following components:   Prothrombin Time 16.0 (*)    INR 1.3 (*)    All other components within normal limits  APTT - Abnormal; Notable for the following components:   aPTT 38 (*)    All other components within normal limits  CK - Abnormal; Notable for the following components:   Total CK 3,367 (*)    All other components within normal limits  I-STAT CHEM 8, ED - Abnormal; Notable for the following components:   Sodium 132 (*)    Creatinine, Ser 1.50 (*)    Glucose, Bld 171 (*)    Calcium, Ion 1.03 (*)    TCO2 20 (*)    All other components within normal limits  I-STAT CG4 LACTIC ACID, ED - Abnormal; Notable for the following components:   Lactic Acid, Venous 3.5 (*)    All other components within normal limits  CULTURE, BLOOD (ROUTINE X 2)  CULTURE, BLOOD (ROUTINE X 2)  RESP PANEL BY RT-PCR (RSV, FLU A&B, COVID)  RVPGX2  ETHANOL  URINALYSIS, W/ REFLEX TO CULTURE (INFECTION SUSPECTED)  RAPID URINE DRUG SCREEN, HOSP PERFORMED  I-STAT CG4 LACTIC ACID, ED    EKG EKG Interpretation Date/Time:  Wednesday August 12 2023 10:28:02 EDT Ventricular Rate:  93 PR Interval:    QRS Duration:  142 QT Interval:  350 QTC  Calculation: 436 R Axis:   40  Text Interpretation: Atrial fibrillation Paired ventricular premature complexes Aberrant conduction of SV complex(es) Right bundle branch block since last tracing no significant change Confirmed by Rolan Bucco [phone]) on 08/12/2023 11:22:20 AM  Radiology DG Chest Portable 1 View Result Date: 08/12/2023 CLINICAL DATA:  Trauma. EXAM: PORTABLE CHEST 1 VIEW COMPARISON:  April 30, 2020. FINDINGS:  Stable cardiomediastinal silhouette. Both lungs are clear. The visualized skeletal structures are unremarkable. IMPRESSION: No active disease. Electronically Signed   By: Lupita Raider M.D.   On: 08/12/2023 12:44   DG Pelvis Portable Result Date: 08/12/2023 CLINICAL DATA:  Trauma. EXAM: PORTABLE PELVIS 1-2 VIEWS COMPARISON:  None Available. FINDINGS: No acute fracture or dislocation. The bones are osteopenic. Mild bilateral hip arthritic changes. The soft tissues are unremarkable. IMPRESSION: 1. No acute fracture or dislocation. 2. Osteopenia. Electronically Signed   By: Elgie Collard M.D.   On: 08/12/2023 12:42   CT Head Wo Contrast Result Date: 08/12/2023 CLINICAL DATA:  Ataxia, head trauma; Ataxia, cervical trauma EXAM: CT HEAD WITHOUT CONTRAST CT CERVICAL SPINE WITHOUT CONTRAST TECHNIQUE: Multidetector CT imaging of the head and cervical spine was performed following the standard protocol without intravenous contrast. Multiplanar CT image reconstructions of the cervical spine were also generated. RADIATION DOSE REDUCTION: This exam was performed according to the departmental dose-optimization program which includes automated exposure control, adjustment of the mA and/or kV according to patient size and/or use of iterative reconstruction technique. COMPARISON:  MRI head from 12/07/2020, CT scan head from 12/07/2020 and CT angiography neck from 04/30/2020. FINDINGS: CT HEAD FINDINGS Brain: No evidence of acute infarction, hemorrhage, hydrocephalus, extra-axial collection or  mass lesion/mass effect. There is bilateral periventricular hypodensity, which is non-specific but most likely seen in the settings of microvascular ischemic changes. Moderate in extent. Redemonstration of chronic infarct in the midline/right paramedian pons, similar to the prior study. Otherwise normal appearance of brain parenchyma. Cerebral volume loss with enlargement of the ventricles. Vascular: No hyperdense vessel or unexpected calcification. Intracranial arteriosclerosis. Skull: Normal. Negative for fracture or focal lesion. Sinuses/Orbits: No acute finding. Other: Visualized mastoid air cells are unremarkable. No mastoid effusion. CT CERVICAL SPINE FINDINGS Alignment: There is grade 1 anterolisthesis of C5 over C6, most likely degenerative. This examination does not assess for ligamentous injury or stability. Skull base and vertebrae: No acute fracture. No primary bone lesion or focal pathologic process. Soft tissues and spinal canal: No prevertebral fluid or swelling. No visible canal hematoma. Disc levels: Moderate multilevel degenerative changes characterized by reduced intervertebral disc height (most pronounced at C6-7 level), along with moderate multilevel facet arthropathy and mild marginal osteophyte formation. Upper chest: Negative. Others: There is a 9 x 10 mm hypoattenuating nodule in the right thyroid lobe, incompletely characterized on the current exam. However, nodule does not meet the size criteria for follow-up ultrasound evaluation. IMPRESSION: *No acute intracranial abnormality. *No acute osseous injury of the cervical spine. Electronically Signed   By: Jules Schick M.D.   On: 08/12/2023 11:12   CT Cervical Spine Wo Contrast Result Date: 08/12/2023 CLINICAL DATA:  Ataxia, head trauma; Ataxia, cervical trauma EXAM: CT HEAD WITHOUT CONTRAST CT CERVICAL SPINE WITHOUT CONTRAST TECHNIQUE: Multidetector CT imaging of the head and cervical spine was performed following the standard protocol  without intravenous contrast. Multiplanar CT image reconstructions of the cervical spine were also generated. RADIATION DOSE REDUCTION: This exam was performed according to the departmental dose-optimization program which includes automated exposure control, adjustment of the mA and/or kV according to patient size and/or use of iterative reconstruction technique. COMPARISON:  MRI head from 12/07/2020, CT scan head from 12/07/2020 and CT angiography neck from 04/30/2020. FINDINGS: CT HEAD FINDINGS Brain: No evidence of acute infarction, hemorrhage, hydrocephalus, extra-axial collection or mass lesion/mass effect. There is bilateral periventricular hypodensity, which is non-specific but most likely seen in the settings of microvascular ischemic changes.  Moderate in extent. Redemonstration of chronic infarct in the midline/right paramedian pons, similar to the prior study. Otherwise normal appearance of brain parenchyma. Cerebral volume loss with enlargement of the ventricles. Vascular: No hyperdense vessel or unexpected calcification. Intracranial arteriosclerosis. Skull: Normal. Negative for fracture or focal lesion. Sinuses/Orbits: No acute finding. Other: Visualized mastoid air cells are unremarkable. No mastoid effusion. CT CERVICAL SPINE FINDINGS Alignment: There is grade 1 anterolisthesis of C5 over C6, most likely degenerative. This examination does not assess for ligamentous injury or stability. Skull base and vertebrae: No acute fracture. No primary bone lesion or focal pathologic process. Soft tissues and spinal canal: No prevertebral fluid or swelling. No visible canal hematoma. Disc levels: Moderate multilevel degenerative changes characterized by reduced intervertebral disc height (most pronounced at C6-7 level), along with moderate multilevel facet arthropathy and mild marginal osteophyte formation. Upper chest: Negative. Others: There is a 9 x 10 mm hypoattenuating nodule in the right thyroid lobe,  incompletely characterized on the current exam. However, nodule does not meet the size criteria for follow-up ultrasound evaluation. IMPRESSION: *No acute intracranial abnormality. *No acute osseous injury of the cervical spine. Electronically Signed   By: Jules Schick M.D.   On: 08/12/2023 11:12    Procedures Procedures    Medications Ordered in ED Medications  lactated ringers infusion ( Intravenous New Bag/Given 08/12/23 1336)  vancomycin (VANCOREADY) IVPB 2000 mg/400 mL (2,000 mg Intravenous New Bag/Given 08/12/23 1304)  metroNIDAZOLE (FLAGYL) IVPB 500 mg (has no administration in time range)  enoxaparin (LOVENOX) injection 40 mg (has no administration in time range)  sodium chloride flush (NS) 0.9 % injection 3 mL (has no administration in time range)  acetaminophen (TYLENOL) tablet 650 mg (has no administration in time range)    Or  acetaminophen (TYLENOL) suppository 650 mg (has no administration in time range)  polyethylene glycol (MIRALAX / GLYCOLAX) packet 17 g (has no administration in time range)  0.9 %  sodium chloride infusion (has no administration in time range)  lactated ringers bolus 1,000 mL (0 mLs Intravenous Stopped 08/12/23 1335)  ceFEPIme (MAXIPIME) 2 g in sodium chloride 0.9 % 100 mL IVPB (0 g Intravenous Stopped 08/12/23 1307)  metroNIDAZOLE (FLAGYL) IVPB 500 mg (0 mg Intravenous Stopped 08/12/23 1335)    ED Course/ Medical Decision Making/ A&P                                 Medical Decision Making Amount and/or Complexity of Data Reviewed Labs: ordered. Radiology: ordered.  Risk Prescription drug management. Decision regarding hospitalization.   Patient is a 89 year old male who presents with unresponsiveness.  He was completely unresponsive with gurgling respirations on arrival.  Would have met criteria for immediate intubation although he has a DNR and MOST form at bedside which indicate he would not want those measures done.  Did speak with his daughter and  wife who confirmed this.  Head CT does not show any acute abnormality.  No intracranial hemorrhage.  Labs show evidence of an AKI with some rhabdomyolysis.  He was started on IV fluids.  His rectal temp was normal at 97.4.  His white count is markedly elevated.  His lactate was elevated.  Even though I do not see any obvious source of infection, we will go ahead and start antibiotics for possible sepsis.  He had chest x-ray which was interpreted by me and confirmed by the radiologist to show no evidence of  pneumonia.  Urinalysis is still pending.  EtOH level is negative.  Patient has started to have some improvement in his mental status and that he had some verbal response to his daughter and seems to be moving his extremities more.  I do not see any obvious focal deficits although he is not following commands and neuroexam is limited.  Discussed with Dr. Alinda Money who will admit the patient for further treatment.  CRITICAL CARE Performed by: Rolan Bucco Total critical care time: 80 minutes Critical care time was exclusive of separately billable procedures and treating other patients. Critical care was necessary to treat or prevent imminent or life-threatening deterioration. Critical care was time spent personally by me on the following activities: development of treatment plan with patient and/or surrogate as well as nursing, discussions with consultants, evaluation of patient's response to treatment, examination of patient, obtaining history from patient or surrogate, ordering and performing treatments and interventions, ordering and review of laboratory studies, ordering and review of radiographic studies, pulse oximetry and re-evaluation of patient's condition.   Final Clinical Impression(s) / ED Diagnoses Final diagnoses:  Unresponsive  AKI (acute kidney injury) (HCC)  Non-traumatic rhabdomyolysis    Rx / DC Orders ED Discharge Orders     None         Rolan Bucco, MD 08/12/23  1405

## 2023-08-12 NOTE — TOC CAGE-AID Note (Signed)
 Transition of Care [facility]) - CAGE-AID Screening  Patient Details  Name: Steven Meadows MRN: [id number] Date of Birth: 11-20-1931  Clinical Narrative:  Patient currently disoriented and cannot participate in screening. Per family at bedside, patient does not drink alcohol and does not use illicit drugs.  CAGE-AID Screening: Substance Abuse Screening unable to be completed due to: : Patient unable to participate

## 2023-08-12 NOTE — ED Notes (Signed)
 Trauma Response Nurse Documentation  Steven Meadows is a 89 y.o. male arriving to [facility] ED via EMS  On Eliquis (apixaban) daily. Trauma was activated as a Level 2 based on the following trauma criteria Elderly patients > 65 with head trauma on anti-coagulation (excluding ASA).  Patient cleared for CT by Dr. Fredderick Phenix. Pt transported to CT with trauma response nurse present to monitor. RN remained with the patient throughout their absence from the department for clinical observation. GCS 7.  History   Past Medical History:  Diagnosis Date   Abdominal aortic atherosclerosis (HCC)    Actinic keratoses    Aneurysm of vertebral artery (HCC)    Atrial fibrillation (HCC)    Cough    History of mononucleosis    HTN (hypertension)    Hypercholesteremia    Lacunar stroke (HCC)    Low back pain    Olecranon bursitis    Rotator cuff tear    Stroke [facility])      Past Surgical History:  Procedure Laterality Date   CATARACT EXTRACTION--right eye  2013   TONSILLECTOMY AND ADENOIDECTOMY       Initial Focused Assessment (If applicable, or please see trauma documentation): Patient responsive to pain, will open eyes with painful stimulation, GCS 7 - not upgraded due to patient being a DNR Airway with secretions, some increased WOB but SpO2 at 100%, patient is a DNR and family would not want him intubated Pulses 2+, generalized edema  CT's Completed:   CT Head and CT C-Spine   Interventions:  IV, labs CXR/PXR CT Head/Cspine Infectious workup-cultures, urinalysis  Plan for disposition:  Admission to floor   Event Summary: Patient to ED after being found down by staff, unresponsive. Patient responds to pain, increased WOB but DNR/DNI. Imaging was ordered and negative for intracranial injury. Infectious workup for etiology. Family at bedside - wife and daughter.   Bedside handoff with ED RN Delorise Jackson.    [name]  Trauma Response RN  Please call TRN at [phone] for further  assistance.

## 2023-08-12 NOTE — Progress Notes (Signed)
 EEG complete - results pending

## 2023-08-13 ENCOUNTER — Inpatient Hospital Stay (HOSPITAL_COMMUNITY)

## 2023-08-13 DIAGNOSIS — R569 Unspecified convulsions: Secondary | ICD-10-CM | POA: Diagnosis not present

## 2023-08-13 DIAGNOSIS — G9340 Encephalopathy, unspecified: Secondary | ICD-10-CM | POA: Diagnosis not present

## 2023-08-13 DIAGNOSIS — R9089 Other abnormal findings on diagnostic imaging of central nervous system: Secondary | ICD-10-CM

## 2023-08-13 DIAGNOSIS — R29818 Other symptoms and signs involving the nervous system: Secondary | ICD-10-CM | POA: Diagnosis not present

## 2023-08-13 DIAGNOSIS — R4182 Altered mental status, unspecified: Secondary | ICD-10-CM | POA: Diagnosis not present

## 2023-08-13 DIAGNOSIS — R509 Fever, unspecified: Secondary | ICD-10-CM | POA: Diagnosis not present

## 2023-08-13 LAB — COMPREHENSIVE METABOLIC PANEL WITH GFR
ALT: 61 U/L — ABNORMAL HIGH (ref 0–44)
AST: 266 U/L — ABNORMAL HIGH (ref 15–41)
Albumin: 3.2 g/dL — ABNORMAL LOW (ref 3.5–5.0)
Alkaline Phosphatase: 24 U/L — ABNORMAL LOW (ref 38–126)
Anion gap: 13 (ref 5–15)
BUN: 14 mg/dL (ref 8–23)
CO2: 22 mmol/L (ref 22–32)
Calcium: 8.6 mg/dL — ABNORMAL LOW (ref 8.9–10.3)
Chloride: 99 mmol/L (ref 98–111)
Creatinine, Ser: 1.18 mg/dL (ref 0.61–1.24)
GFR, Estimated: 59 mL/min — ABNORMAL LOW (ref >60)
Glucose, Bld: 150 mg/dL — ABNORMAL HIGH (ref 70–99)
Potassium: 3.7 mmol/L (ref 3.5–5.1)
Sodium: 134 mmol/L — ABNORMAL LOW (ref 135–145)
Total Bilirubin: 1 mg/dL (ref 0.0–1.2)
Total Protein: 6.2 g/dL — ABNORMAL LOW (ref 6.5–8.1)

## 2023-08-13 LAB — BLOOD CULTURE ID PANEL (REFLEXED) - BCID2

## 2023-08-13 LAB — CBC
HCT: 43.8 % (ref 39.0–52.0)
Hemoglobin: 14.6 g/dL (ref 13.0–17.0)
MCH: 28.3 pg (ref 26.0–34.0)
MCHC: 33.3 g/dL (ref 30.0–36.0)
MCV: 85 fL (ref 80.0–100.0)
Platelets: 208 10*3/uL (ref 150–400)
RBC: 5.15 MIL/uL (ref 4.22–5.81)
RDW: 13.8 % (ref 11.5–15.5)
WBC: 15.1 10*3/uL — ABNORMAL HIGH (ref 4.0–10.5)
nRBC: 0 % (ref 0.0–0.2)

## 2023-08-13 LAB — APTT
aPTT: 87 s — ABNORMAL HIGH (ref 24–36)
aPTT: 75 s — ABNORMAL HIGH (ref 24–36)

## 2023-08-13 LAB — HEPARIN LEVEL (UNFRACTIONATED): Heparin Unfractionated: >1.1 [IU]/mL — ABNORMAL HIGH (ref 0.30–0.70)

## 2023-08-13 MED ORDER — VANCOMYCIN HCL 750 MG/150ML IV SOLN
750.0000 mg | Freq: Two times a day (BID) | INTRAVENOUS | Status: DC
  Administered 2023-08-14 – 2023-08-15 (×3): 750 mg via INTRAVENOUS
  Filled 2023-08-13 (×3): qty 150

## 2023-08-13 MED ORDER — DEXTROSE 5 % IV SOLN
10.0000 mg/kg | Freq: Two times a day (BID) | INTRAVENOUS | Status: DC
  Administered 2023-08-13 – 2023-08-14 (×3): 900 mg via INTRAVENOUS
  Filled 2023-08-13 (×6): qty 18

## 2023-08-13 MED ORDER — LEVETIRACETAM IN NACL 1000 MG/100ML IV SOLN
1000.0000 mg | Freq: Two times a day (BID) | INTRAVENOUS | Status: DC
  Administered 2023-08-14 – 2023-08-15 (×3): 1000 mg via INTRAVENOUS
  Filled 2023-08-13 (×3): qty 100

## 2023-08-13 MED ORDER — SODIUM CHLORIDE 0.9 % IV SOLN
4000.0000 mg | Freq: Once | INTRAVENOUS | Status: AC
  Administered 2023-08-13: 4000 mg via INTRAVENOUS
  Filled 2023-08-13: qty 40

## 2023-08-13 MED ORDER — LORAZEPAM 2 MG/ML IJ SOLN
1.0000 mg | Freq: Once | INTRAMUSCULAR | Status: AC
  Administered 2023-08-13: 1 mg via INTRAVENOUS
  Filled 2023-08-13: qty 1

## 2023-08-13 MED ORDER — LACTATED RINGERS IV SOLN: INTRAVENOUS | Status: DC

## 2023-08-13 MED ORDER — SODIUM CHLORIDE 0.9 % IV SOLN
200.0000 mg | Freq: Once | INTRAVENOUS | Status: AC
  Administered 2023-08-13: 200 mg via INTRAVENOUS
  Filled 2023-08-13: qty 20

## 2023-08-13 NOTE — Progress Notes (Signed)
 PROGRESS NOTE  Steven Meadows TKZ:[id number] DOB: 04-18-1932 DOA: 08/12/2023 PCP: Pcp, No   LOS: 1 day   Brief narrative:   Steven Meadows is a 89 y.o. male with past medical history significant of hypertension, hyperlipidemia, atrial fibrillation, stroke, intracerebral hemorrhage, renal disease presented to hospital after found to be unresponsive at his assisted living facility.  He had been having progressive weakness recently and was found facedown on the floor.  He was noted to be hypoxic with pulse ox of 84% on room air and was transferred to the ED.  In the ED, patient had temperature of 97.4,  blood pressure in the 170 systolic,.  Labs showed mild hyponatremia with sodium of 133 and creatinine of 1.5 from baseline 1.0.  AST was slightly elevated at 49.  CBC showed leukocytosis at 21.4.   PT and INR elevated at 16 and 1.3 respectively.  PTT elevated to 38.  CK elevated to 3367.  Lactic acid initially elevated at 3.5  Respiratory panel for flu COVID and RSV was negative.  Urinalysis showed no white cells.  Ethanol level negative.   CT head, CT spine showed no acute normality.  Chest x-ray without infiltrate.  Pelvis x-ray showed no acute abnormality.  Patient was started on broad-spectrum antibiotics including vancomycin, cefepime, Flagyl in the ED.  Also received 1 L IV fluids and was started on a rate 150 cc an hour.  Patient was then admitted hospital for further evaluation and treatment.     Assessment/Plan: Principal Problem:   Acute encephalopathy Active Problems:   History of CVA (cerebrovascular accident)   Chronic atrial fibrillation (HCC)   HLD (hyperlipidemia)   Essential hypertension  Acute metabolic encephalopathy  Patient found unresponsive by staff at assisted living facility.  Was hypoxic on presentation.  Uncertain whether there was  postictal/seizure etiology.  Patient is a DNR/DNI with a MOST form wanting antibiotics and fluids.  Continue broad-spectrum antibiotic for now  with ampicillin, acyclovir, Rocephin and vancomycin for empiric meningitis coverage..  Continue supportive care.  EEG showed diffuse encephalopathy.  MRI of the brain did not show acute findings but extensive chronic microvascular ischemic changes.  Continue seizure precautions.  Neurology has seen the patient and recommend overnight EEG with empiric CNS coverage.  Antiepileptic if EEG showed seizure.  Plan for lumbar puncture tomorrow due to being on Eliquis..  Sepsis  Possible sepsis given temperature 97.4, tachycardia, tachypnea, leukocytosis 21.4 and L.  Lactate at 3.5.  No obvious source of infection but patient is altered so we will need to rule out CNS infection.  Neurology recommending lumbar puncture tomorrow.  Overnight EEG today.  Continue broad-spectrum antibiotic.  Supportive care.   History of CVA MRI without any acute findings.  History of atrial fibrillation.  Has been started on heparin drip.  Was on Eliquis at home.  Currently on hold.  Might need lumbar puncture.   Hypertension Hypertensives on hold.   Hyperlipidemia - Zetia and Crestor on hold.  DVT prophylaxis:  Heparin drip  Disposition: Likely to skilled nursing facility will need PT OT evaluation once stable  Status is: Inpatient Remains inpatient appropriate because: Metabolic encephalopathy, IV antibiotic, pending workup,    Code Status:     Code Status: Limited: Do not attempt resuscitation (DNR) -DNR-LIMITED -Do Not Intubate/DNI   Family Communication: Spoke with the patient's 2 daughters at bedside.  Consultants: Neurology  Procedures: EEG  Anti-infectives:  Vancomycin, acyclovir, ceftriaxone, metronidazole  Anti-infectives (From admission, onward)    Start  Dose/Rate Route Frequency Ordered Stop   08/13/23 1200  vancomycin (VANCOCIN) IVPB 1000 mg/200 mL premix        1,000 mg 200 mL/hr over 60 Minutes Intravenous Every 24 hours 08/12/23 1405     08/13/23 1000  acyclovir (ZOVIRAX) 900 mg  in dextrose 5 % 250 mL IVPB        10 mg/kg  90 kg 268 mL/hr over 60 Minutes Intravenous Every 12 hours 08/13/23 0857     08/13/23 0000  ceFEPIme (MAXIPIME) 2 g in sodium chloride 0.9 % 100 mL IVPB  Status:  Discontinued        2 g 200 mL/hr over 30 Minutes Intravenous Every 12 hours 08/12/23 1405 08/12/23 1929   08/12/23 2200  cefTRIAXone (ROCEPHIN) 2 g in sodium chloride 0.9 % 100 mL IVPB        2 g 200 mL/hr over 30 Minutes Intravenous Every 12 hours 08/12/23 1929     08/12/23 2000  metroNIDAZOLE (FLAGYL) IVPB 500 mg        500 mg 100 mL/hr over 60 Minutes Intravenous Every 12 hours 08/12/23 1345     08/12/23 1945  ampicillin (OMNIPEN) 2 g in sodium chloride 0.9 % 100 mL IVPB        2 g 300 mL/hr over 20 Minutes Intravenous Every 6 hours 08/12/23 1929     08/12/23 1230  ceFEPIme (MAXIPIME) 2 g in sodium chloride 0.9 % 100 mL IVPB        2 g 200 mL/hr over 30 Minutes Intravenous  Once 08/12/23 1218 08/12/23 1307   08/12/23 1230  metroNIDAZOLE (FLAGYL) IVPB 500 mg        500 mg 100 mL/hr over 60 Minutes Intravenous  Once 08/12/23 1218 08/12/23 1335   08/12/23 1230  vancomycin (VANCOCIN) IVPB 1000 mg/200 mL premix  Status:  Discontinued        1,000 mg 200 mL/hr over 60 Minutes Intravenous  Once 08/12/23 1218 08/12/23 1219   08/12/23 1230  vancomycin (VANCOREADY) IVPB 2000 mg/400 mL        2,000 mg 200 mL/hr over 120 Minutes Intravenous  Once 08/12/23 1219 08/12/23 1516        Subjective: Today, patient was seen and examined at bedside.  Daughters at bedside.  Patient starting to be a little more conversive at this time.  Patient denies any nausea vomiting headache.  Wiggling toes and squeezing hands on command.  Understand that he is in the hospital.  Objective: Vitals:   08/13/23 0900 08/13/23 0930  BP: (!) 162/93 (!) 153/111  Pulse: 65 99  Resp: (!) 23 (!) 28  Temp:    SpO2: 92% 92%    Intake/Output Summary (Last 24 hours) at 08/13/2023 1033 Last data filed at  08/13/2023 0657 Gross per 24 hour  Intake 111.33 ml  Output 800 ml  Net -688.67 ml   Filed Weights   08/13/23 0810  Weight: 90 kg   Body mass index is 27.67 kg/m.   Physical Exam: GENERAL: Patient is mildly somnolent but interactive, oriented to place, not in obvious distress.  HENT: No scleral pallor or icterus. Pupils equally reactive to light. Oral mucosa is moist NECK: is supple, no gross swelling noted. CHEST: Clear to auscultation. No crackles or wheezes.   CVS: S1 and S2 heard, no murmur. Regular rate and rhythm.  ABDOMEN: Soft, non-tender, bowel sounds are present. EXTREMITIES: No edema. CNS: Generalized weakness noted over the extremities, wiggles toes.  Squeezes hands, SKIN:  warm and dry without rashes.  Data Review: I have personally reviewed the following laboratory data and studies,  CBC: Recent Labs  Lab 08/12/23 1037 08/12/23 1044 08/13/23 0406  WBC 21.4*  --  15.1*  NEUTROABS 17.4*  --   --   HGB 15.7 16.7 14.6  HCT 48.0 49.0 43.8  MCV 87.6  --  85.0  PLT 246  --  208   Basic Metabolic Panel: Recent Labs  Lab 08/12/23 1037 08/12/23 1044 08/13/23 0406  NA 133* 132* 134*  K 4.9 5.0 3.7  CL 97* 100 99  CO2 18*  --  22  GLUCOSE 169* 171* 150*  BUN 15 17 14   CREATININE 1.50* 1.50* 1.18  CALCIUM 9.1  --  8.6*   Liver Function Tests: Recent Labs  Lab 08/12/23 1037 08/13/23 0406  AST 49* 266*  ALT 31 61*  ALKPHOS 31* 24*  BILITOT 0.5 1.0  PROT 7.8 6.2*  ALBUMIN 4.2 3.2*   No results for input(s): "LIPASE", "AMYLASE" in the last 168 hours. No results for input(s): "AMMONIA" in the last 168 hours. Cardiac Enzymes: Recent Labs  Lab 08/12/23 1037  CKTOTAL 3,367*   BNP (last 3 results) No results for input(s): "BNP" in the last 8760 hours.  ProBNP (last 3 results) No results for input(s): "PROBNP" in the last 8760 hours.  CBG: No results for input(s): "GLUCAP" in the last 168 hours. Recent Results (from the past 240 hours)  Culture,  blood (routine x 2)     Status: None (Preliminary result)   Collection Time: 08/12/23 11:22 AM   Specimen: BLOOD LEFT HAND  Result Value Ref Range Status   Specimen Description BLOOD LEFT HAND  Final   Special Requests   Final    BOTTLES DRAWN AEROBIC AND ANAEROBIC Blood Culture results may not be optimal due to an inadequate volume of blood received in culture bottles   Culture   Final    NO GROWTH < 24 HOURS Performed at [facility], [address], [place], [place] [address]    Report Status PENDING  Incomplete  Resp panel by RT-PCR (RSV, Flu A&B, Covid) Anterior Nasal Swab     Status: None   Collection Time: 08/12/23 11:26 AM   Specimen: Anterior Nasal Swab  Result Value Ref Range Status   SARS Coronavirus 2 by RT PCR NEGATIVE NEGATIVE Final   Influenza A by PCR NEGATIVE NEGATIVE Final   Influenza B by PCR NEGATIVE NEGATIVE Final    Comment: (NOTE) The Xpert Xpress SARS-CoV-2/FLU/RSV plus assay is intended as an aid in the diagnosis of influenza from Nasopharyngeal swab specimens and should not be used as a sole basis for treatment. Nasal washings and aspirates are unacceptable for Xpert Xpress SARS-CoV-2/FLU/RSV testing.  Fact Sheet for Patients: BloggerCourse.com  Fact Sheet for Healthcare Providers: SeriousBroker.it  This test is not yet approved or cleared by the [place] FDA and has been authorized for detection and/or diagnosis of SARS-CoV-2 by FDA under an Emergency Use Authorization (EUA). This EUA will remain in effect (meaning this test can be used) for the duration of the COVID-19 declaration under Section 564(b)(1) of the Act, 21 U.S.C. section 360bbb-3(b)(1), unless the authorization is terminated or revoked.     Resp Syncytial Virus by PCR NEGATIVE NEGATIVE Final    Comment: (NOTE) Fact Sheet for Patients: BloggerCourse.com  Fact Sheet for Healthcare  Providers: SeriousBroker.it  This test is not yet approved or cleared by the [place] and  has been authorized for detection and/or diagnosis of SARS-CoV-2 by FDA under an Emergency Use Authorization (EUA). This EUA will remain in effect (meaning this test can be used) for the duration of the COVID-19 declaration under Section 564(b)(1) of the Act, 21 U.S.C. section 360bbb-3(b)(1), unless the authorization is terminated or revoked.  Performed at [facility], [address] [place], [place] [address]   Culture, blood (routine x 2)     Status: None (Preliminary result)   Collection Time: 08/12/23 11:55 AM   Specimen: BLOOD LEFT ARM  Result Value Ref Range Status   Specimen Description BLOOD LEFT ARM  Final   Special Requests   Final    BOTTLES DRAWN AEROBIC AND ANAEROBIC Blood Culture adequate volume   Culture  Setup Time   Final    GRAM POSITIVE COCCI ANAEROBIC BOTTLE ONLY CRITICAL RESULT CALLED TO, READ BACK BY AND VERIFIED WITH: PHARMD Francie Massing on [phone] @0755  by SM Performed at [facility], [address] [place], [place] [address]    Culture GRAM POSITIVE COCCI  Final   Report Status PENDING  Incomplete  Blood Culture ID Panel (Reflexed)     Status: Abnormal   Collection Time: 08/12/23 11:55 AM  Result Value Ref Range Status   Enterococcus faecalis NOT DETECTED NOT DETECTED Final   Enterococcus Faecium NOT DETECTED NOT DETECTED Final   Listeria monocytogenes NOT DETECTED NOT DETECTED Final   Staphylococcus species DETECTED (A) NOT DETECTED Final    Comment: CRITICAL RESULT CALLED TO, READ BACK BY AND VERIFIED WITH: PHARMD Bailey A on [phone] @0755  by SM    Staphylococcus aureus (BCID) NOT DETECTED NOT DETECTED Final   Staphylococcus epidermidis DETECTED (A) NOT DETECTED Final    Comment: Methicillin (oxacillin) resistant coagulase negative staphylococcus. Possible blood culture contaminant (unless isolated from more than one  blood culture draw or clinical case suggests pathogenicity). No antibiotic treatment is indicated for blood  culture contaminants. CRITICAL RESULT CALLED TO, READ BACK BY AND VERIFIED WITH: PHARMD Bailey A on [phone] @0755  by SM    Staphylococcus lugdunensis NOT DETECTED NOT DETECTED Final   Streptococcus species NOT DETECTED NOT DETECTED Final   Streptococcus agalactiae NOT DETECTED NOT DETECTED Final   Streptococcus pneumoniae NOT DETECTED NOT DETECTED Final   Streptococcus pyogenes NOT DETECTED NOT DETECTED Final   A.calcoaceticus-baumannii NOT DETECTED NOT DETECTED Final   Bacteroides fragilis NOT DETECTED NOT DETECTED Final   Enterobacterales NOT DETECTED NOT DETECTED Final   Enterobacter cloacae complex NOT DETECTED NOT DETECTED Final   Escherichia coli NOT DETECTED NOT DETECTED Final   Klebsiella aerogenes NOT DETECTED NOT DETECTED Final   Klebsiella oxytoca NOT DETECTED NOT DETECTED Final   Klebsiella pneumoniae NOT DETECTED NOT DETECTED Final   Proteus species NOT DETECTED NOT DETECTED Final   Salmonella species NOT DETECTED NOT DETECTED Final   Serratia marcescens NOT DETECTED NOT DETECTED Final   Haemophilus influenzae NOT DETECTED NOT DETECTED Final   Neisseria meningitidis NOT DETECTED NOT DETECTED Final   Pseudomonas aeruginosa NOT DETECTED NOT DETECTED Final   Stenotrophomonas maltophilia NOT DETECTED NOT DETECTED Final   Candida albicans NOT DETECTED NOT DETECTED Final   Candida auris NOT DETECTED NOT DETECTED Final   Candida glabrata NOT DETECTED NOT DETECTED Final   Candida krusei NOT DETECTED NOT DETECTED Final   Candida parapsilosis NOT DETECTED NOT DETECTED Final   Candida tropicalis NOT DETECTED NOT DETECTED Final   Cryptococcus neoformans/gattii NOT DETECTED NOT DETECTED Final   Methicillin  resistance mecA/C DETECTED (A) NOT DETECTED Final    Comment: CRITICAL RESULT CALLED TO, READ BACK BY AND VERIFIED WITH: PHARMD Francie Massing on [phone] @0755  by SM Performed at  [facility], [address] [place], [place] [address]      Studies: CT ANGIO HEAD NECK W WO CM Result Date: 08/13/2023 CLINICAL DATA:  Provided history: Stroke, follow-up. EXAM: CT ANGIOGRAPHY HEAD AND NECK WITH AND WITHOUT CONTRAST TECHNIQUE: Multidetector CT imaging of the head and neck was performed using the standard protocol during bolus administration of intravenous contrast. Multiplanar CT image reconstructions and MIPs were obtained to evaluate the vascular anatomy. Carotid stenosis measurements (when applicable) are obtained utilizing NASCET criteria, using the distal internal carotid diameter as the denominator. RADIATION DOSE REDUCTION: This exam was performed according to the departmental dose-optimization program which includes automated exposure control, adjustment of the mA and/or kV according to patient size and/or use of iterative reconstruction technique. CONTRAST:  75mL OMNIPAQUE IOHEXOL 350 MG/ML SOLN COMPARISON:  Brain MRI 08/12/2023. CT angiogram head/neck 04/30/2020. MRA head 12/07/2020. Obstructive plaque FINDINGS: CT HEAD FINDINGS Motion degraded exam. Brain: Generalized cerebral atrophy. Chronic lacunar infarcts within the bilateral cerebral hemispheric white matter, within/about the bilateral deep gray nuclei and within the pons, some of which were better appreciated on the same-day brain MRI. Advanced patchy and confluent hypoattenuation elsewhere in the cerebral white matter, nonspecific but compatible with chronic small vessel ischemic disease. Diffusion-weighted signal abnormality noted within the anteromedial right temporal lobe, right hippocampus and dorsomedial right thalamus on same-day brain MRI. There is no acute intracranial hemorrhage. No extra-axial fluid collection. No evidence of an intracranial mass. No midline shift. Vascular: No hyperdense vessel. Atherosclerotic calcifications. Skull: No calvarial fracture or aggressive osseous lesion. Sinuses/Orbits: No  orbital mass or acute orbital finding. Small mucous retention cysts within the bilateral frontal sinuses. Minimal mucosal thickening scattered within the ethmoid air cells. Mild mucosal thickening within the maxillary sinuses. Other: Trace fluid within right mastoid air cells. Review of the MIP images confirms the above findings CTA NECK FINDINGS Aortic arch: Common origin of the innominate and left common carotid arteries. The visualized thoracic aorta is normal in caliber. Aspect plaque within the visualized thoracic aorta and proximal major branch vessels of the neck. No hemodynamically significant innominate or proximal subclavian artery stenosis. Right carotid system: CCA and ICA patent within the neck. Atherosclerotic plaque about the carotid bifurcation and within the proximal ICA without hemodynamically significant stenosis (50% or greater). Tortuosity of the mid cervical ICA. Left carotid system: CCA and ICA patent within the neck. Aspect plaque about the carotid bifurcation S5 plaque both carotid bifurcation and within the proximal ICA without hemodynamically significant stenosis (50% or greater). Mild nonstenotic calcified plaque also present within the distal cervical ICA. Vertebral arteries: The left vertebral artery is non-dominant and developmentally diminutive, but patent throughout the neck. Calcified aspect plaque at the origin of this vessel. The dominant right vertebral artery is patent within the neck. Nonstenotic calcified plaque at the origin of this vessel. Skeleton: Spondylosis at the cervical and visualized upper thoracic levels. C2-C3 vertebral ankylosis. Mild chronic T5 superior endplate compression deformity. Other neck: No neck mass or cervical lymphadenopathy. Upper chest: No consolidation within the imaged lung apices. Review of the MIP images confirms the above findings CTA HEAD FINDINGS Anterior circulation: The intracranial internal carotid arteries are patent. Calcified  atherosclerotic plaque within both vessels. Up to moderate stenosis of the supraclinoid right ICA. No more than mild stenosis elsewhere  within the intracranial internal carotid arteries. The M1 middle cerebral arteries are patent. No M2 proximal branch occlusion or high-grade proximal stenosis. The anterior cerebral arteries are patent. Somewhat hypoplastic right A1 segment. Posterior circulation: The intracranial vertebral arteries are patent. The non-dominant left vertebral artery terminates predominantly (or entirely) as the left PICA. Atherosclerotic plaque within the intracranial right vertebral artery with sites of up to moderate stenosis. 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 The basilar artery is patent. Atherosclerotic plaque within the proximal basilar artery with no more than mild stenosis. The posterior cerebral arteries are patent. Atherosclerotic irregularity of both vessels. Most notably, there is a severe stenosis within a left PCA branch at the P2/P3 junction, which is new from prior exams (series 16, image 26). Hypoplastic right P1 segment with sizable right posterior communicating artery. The left posterior cerebral artery is fetal in origin. Venous sinuses: Limited assessment for dural venous sinus thrombosis due to contrast timing. Anatomic variants: As described. Review of the MIP images confirms the above findings Non-contrast head CT impression #2 These results were called by telephone at the time of interpretation on 08/13/2023 at 9:30 am to provider [facility] , who verbally acknowledged these results. IMPRESSION: Non-contrast head CT: 1. Motion degraded exam. 2. No CT evidence of an acute intracranial abnormality. However, diffusion-weighted signal abnormality is noted within the anteromedial right temporal lobe, right hippocampus and dorsomedial right thalamus on the same-day  brain MRI. 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 Acute infarct considered less likely but not excluded. 3. Background parenchymal atrophy, chronic small vessel ischemic disease and chronic lacunar infarcts, as described. 4. Mild paranasal sinus disease. CTA neck: 1. The common carotid and internal carotid arteries are patent within the neck without hemodynamically significant stenosis. Atherosclerotic plaque about the carotid bifurcations and within the proximal internal carotid arteries, bilaterally. 2. The left vertebral artery is non-dominant and developmentally diminutive, but patent throughout the neck. 3. The dominant right vertebral artery is patent within the neck. Non-stenotic atherosclerotic plaque at the origin of this vessel. 4. Aortic Atherosclerosis (ICD10-I70.0). CTA head: 1. No proximal intracranial large vessel occlusion identified. 2. Intracranial atherosclerotic disease with multifocal stenoses, most notably as follows. 3. Severe stenosis within a left PCA branch at the P2/P3 junction, new from prior exams. 4. Sites of up to moderate stenosis within the intracranial right vertebral artery. 5. Up to moderate stenosis of the supraclinoid right internal carotid artery. 6. 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 Electronically Signed   By: Jackey Loge D.O.   On: 08/13/2023 10:11   DG Shoulder Left Port Result Date: 08/12/2023 CLINICAL DATA:  Fall bruising EXAM: LEFT SHOULDER COMPARISON:  None Available. FINDINGS: No fracture or malalignment. Moderate AC joint degenerative change. Mild glenohumeral degenerative change. No fracture or malalignment. Possible remote Hill-Sachs deformity of the superolateral humeral head. IMPRESSION: Degenerative changes.  No definite acute osseous abnormality Electronically Signed   By: Jasmine Pang M.D.   On: 08/12/2023 20:05   MR BRAIN WO CONTRAST Result Date: 08/12/2023 CLINICAL DATA:  Mental status change. EXAM: MRI HEAD WITHOUT CONTRAST TECHNIQUE: Multiplanar, multiecho pulse sequences of the brain and surrounding structures were obtained without intravenous contrast. COMPARISON:  CT head earlier same day.  MRI head 12/07/2020. FINDINGS: Brain: No acute  infarct. Scattered and confluent FLAIR signal abnormality in the periventricular and subcortical white matter suggestive of chronic microvascular ischemic changes. Multiple small remote infarcts in the bilateral corona radiata and periventricular white matter. Additional remote infarcts in the central pons. Additional remote lacunar infarcts in the bilateral basal ganglia and bilateral thalami. Chronic microhemorrhages in the thalami, left greater than right, and the central pons. Marked parenchymal volume loss with slight temporal lobe predominance. No mass lesion or midline shift. Normal appearance of midline structures. The basilar cisterns are patent. No extra-axial fluid collections. Ventricles: Prominence of the lateral ventricles suggestive of underlying parenchymal volume loss. Vascular: Skull base flow voids are visualized. Skull and upper cervical spine: No focal abnormality. Sinuses/Orbits: Orbits are symmetric. Bilateral lens replacement. Mucosal thickening in the frontal sinuses. Other: Small right mastoid effusion. IMPRESSION: No acute intracranial abnormality. Extensive chronic microvascular ischemic changes and parenchymal volume loss. Multiple remote infarcts as above. Chronic microhemorrhages in the thalami and pons which may be related to hypertension. Electronically Signed   By: Emily Filbert M.D.   On: 08/12/2023 18:50   EEG adult Result Date: 08/12/2023 Charlsie Quest, MD     08/12/2023  3:21 PM Patient Name: ALVY ALSOP MRN: [id number] Epilepsy Attending: Charlsie Quest Referring Physician/Provider: Synetta Fail, MD Date: 08/12/2023 Duration: 24.22 mins Patient history: 89yo M with ams. EEG to evaluate for seizure Level of alertness: Awake/ lethargic AEDs during EEG study: None Technical aspects: This EEG study was done with scalp electrodes positioned according to the 10-20 International system of electrode placement. Electrical activity was reviewed with band pass filter of 1-70Hz , sensitivity of 7 uV/mm, display speed of 64mm/sec with a 60Hz  notched filter applied as appropriate. EEG data were recorded continuously and digitally stored.  Video monitoring was available and reviewed as appropriate. Description: EEG showed continuous generalized 3 to 6 Hz theta-delta slowing. Hyperventilation and photic stimulation were not performed.   ABNORMALITY - Continuous slow, generalized IMPRESSION: This study is suggestive of moderate diffuse encephalopathy. No seizures or epileptiform discharges were seen throughout the recording. Charlsie Quest   DG Chest Portable 1 View Result Date: 08/12/2023 CLINICAL DATA:  Trauma. EXAM: PORTABLE CHEST 1 VIEW COMPARISON:  April 30, 2020. FINDINGS: Stable cardiomediastinal silhouette. Both lungs are clear. The visualized skeletal structures are unremarkable. IMPRESSION: No active disease. Electronically Signed   By: Lupita Raider M.D.   On: 08/12/2023 12:44   DG Pelvis Portable Result Date: 08/12/2023 CLINICAL DATA:  Trauma. EXAM: PORTABLE PELVIS 1-2 VIEWS COMPARISON:  None Available. FINDINGS: No acute fracture or dislocation. The bones are osteopenic. Mild bilateral hip arthritic changes. The soft tissues are unremarkable. IMPRESSION: 1. No acute fracture or dislocation. 2. Osteopenia. Electronically Signed   By: Elgie Collard M.D.   On: 08/12/2023 12:42   CT Head Wo Contrast Result Date: 08/12/2023 CLINICAL DATA:  Ataxia, head trauma; Ataxia, cervical trauma EXAM: CT HEAD WITHOUT CONTRAST CT CERVICAL SPINE WITHOUT CONTRAST TECHNIQUE: Multidetector CT imaging of the head and  cervical spine was performed following the standard protocol without intravenous contrast. Multiplanar CT image reconstructions of the cervical spine were also generated. RADIATION DOSE REDUCTION: This exam was performed according to the departmental dose-optimization program which includes automated exposure control, adjustment of the mA and/or kV according to patient size and/or use of iterative reconstruction technique. COMPARISON:  MRI head from 12/07/2020, CT scan head from 12/07/2020 and CT angiography neck from 04/30/2020. FINDINGS: CT HEAD FINDINGS Brain: No evidence of acute infarction,  hemorrhage, hydrocephalus, extra-axial collection or mass lesion/mass effect. There is bilateral periventricular hypodensity, which is non-specific but most likely seen in the settings of microvascular ischemic changes. Moderate in extent. Redemonstration of chronic infarct in the midline/right paramedian pons, similar to the prior study. Otherwise normal appearance of brain parenchyma. Cerebral volume loss with enlargement of the ventricles. Vascular: No hyperdense vessel or unexpected calcification. Intracranial arteriosclerosis. Skull: Normal. Negative for fracture or focal lesion. Sinuses/Orbits: No acute finding. Other: Visualized mastoid air cells are unremarkable. No mastoid effusion. CT CERVICAL SPINE FINDINGS Alignment: There is grade 1 anterolisthesis of C5 over C6, most likely degenerative. This examination does not assess for ligamentous injury or stability. Skull base and vertebrae: No acute fracture. No primary bone lesion or focal pathologic process. Soft tissues and spinal canal: No prevertebral fluid or swelling. No visible canal hematoma. Disc levels: Moderate multilevel degenerative changes characterized by reduced intervertebral disc height (most pronounced at C6-7 level), along with moderate multilevel facet arthropathy and mild marginal osteophyte formation. Upper chest: Negative. Others: There is a 9 x  10 mm hypoattenuating nodule in the right thyroid lobe, incompletely characterized on the current exam. However, nodule does not meet the size criteria for follow-up ultrasound evaluation. IMPRESSION: *No acute intracranial abnormality. *No acute osseous injury of the cervical spine. Electronically Signed   By: Jules Schick M.D.   On: 08/12/2023 11:12   CT Cervical Spine Wo Contrast Result Date: 08/12/2023 CLINICAL DATA:  Ataxia, head trauma; Ataxia, cervical trauma EXAM: CT HEAD WITHOUT CONTRAST CT CERVICAL SPINE WITHOUT CONTRAST TECHNIQUE: Multidetector CT imaging of the head and cervical spine was performed following the standard protocol without intravenous contrast. Multiplanar CT image reconstructions of the cervical spine were also generated. RADIATION DOSE REDUCTION: This exam was performed according to the departmental dose-optimization program which includes automated exposure control, adjustment of the mA and/or kV according to patient size and/or use of iterative reconstruction technique. COMPARISON:  MRI head from 12/07/2020, CT scan head from 12/07/2020 and CT angiography neck from 04/30/2020. FINDINGS: CT HEAD FINDINGS Brain: No evidence of acute infarction, hemorrhage, hydrocephalus, extra-axial collection or mass lesion/mass effect. There is bilateral periventricular hypodensity, which is non-specific but most likely seen in the settings of microvascular ischemic changes. Moderate in extent. Redemonstration of chronic infarct in the midline/right paramedian pons, similar to the prior study. Otherwise normal appearance of brain parenchyma. Cerebral volume loss with enlargement of the ventricles. Vascular: No hyperdense vessel or unexpected calcification. Intracranial arteriosclerosis. Skull: Normal. Negative for fracture or focal lesion. Sinuses/Orbits: No acute finding. Other: Visualized mastoid air cells are unremarkable. No mastoid effusion. CT CERVICAL SPINE FINDINGS Alignment: There is grade  1 anterolisthesis of C5 over C6, most likely degenerative. This examination does not assess for ligamentous injury or stability. Skull base and vertebrae: No acute fracture. No primary bone lesion or focal pathologic process. Soft tissues and spinal canal: No prevertebral fluid or swelling. No visible canal hematoma. Disc levels: Moderate multilevel degenerative changes characterized by reduced intervertebral disc height (most pronounced at C6-7 level), along with moderate multilevel facet arthropathy and mild marginal osteophyte formation. Upper chest: Negative. Others: There is a 9 x 10 mm hypoattenuating nodule in the right thyroid lobe, incompletely characterized on the current exam. However, nodule does not meet the size criteria for follow-up ultrasound evaluation. IMPRESSION: *No acute intracranial abnormality. *No acute osseous injury of the cervical spine. Electronically Signed   By: Jules Schick M.D.   On: 08/12/2023 11:12      Kwaku Mostafa  Kalynn Declercq, MD  [facility] 08/13/2023  If 7PM-7AM, please contact night-coverage

## 2023-08-13 NOTE — Progress Notes (Signed)
 NEUROLOGY CONSULT FOLLOW UP NOTE   Date of service: August 13, 2023 Patient Name: Steven Meadows MRN:  [id number] DOB:  February 02, 1932  Brief HPI  Steven Meadows is a 89 y.o. male with a history of previous stroke, encephalitis at age 49, atrial fibrillation on Eliquis, recent decline who presented on 08/12/2023 with decreased responsiveness.  On my assessment yesterday, he was improving and no longer unresponsive, but he did have a right gaze preference, and it apparent left neglect as well.  MRI and CTA have been negative on 4/9.  Routine EEG on 4/9 was negative.  He has an elevated white count and elevated fever to 102.2.  He was started on empiric CNS coverage yesterday.   Interval Hx/subjective   No significant change since I evaluated him yesterday afternoon. Vitals   Vitals:   08/13/23 0615 08/13/23 0645 08/13/23 0800 08/13/23 0810  BP: (!) 153/97 (!) 164/95 (!) 167/98   Pulse: 89 92 (!) 110   Resp: 14 13 (!) 24   Temp:    (!) 100.4 F (38 C)  TempSrc:    Oral  SpO2: 93% 93% 92%      There is no height or weight on file to calculate BMI.  Physical Exam   General, in bed, NAD  Neurologic Examination   Gen: in bed, NAD  Neuro: MS: He is awake, alert, able to answer simple questions and follow commands. CN: He has a right gaze preference, does not cross midline to the left, he is able to see fingers wiggling in his left hemifield. Motor: He does not move the left side is much as the right, appears to have a mild left hemiparesis but is able to squeeze his fingers on the left. Sensory: He reports sensation bilaterally Labs and Diagnostic Imaging   CBC:  Recent Labs  Lab 08/12/23 1037 08/12/23 1044 08/13/23 0406  WBC 21.4*  --  15.1*  NEUTROABS 17.4*  --   --   HGB 15.7 16.7 14.6  HCT 48.0 49.0 43.8  MCV 87.6  --  85.0  PLT 246  --  208    Basic Metabolic Panel:  Lab Results  Component Value Date   NA 134 (L) 08/13/2023   K 3.7 08/13/2023   CO2 22 08/13/2023    GLUCOSE 150 (H) 08/13/2023   BUN 14 08/13/2023   CREATININE 1.18 08/13/2023   CALCIUM 8.6 (L) 08/13/2023   GFRNONAA 59 (L) 08/13/2023   GFRAA 61 08/18/2019   Lipid Panel:  Lab Results  Component Value Date   LDLCALC 98 12/08/2020   HgbA1c:  Lab Results  Component Value Date   HGBA1C 6.0 (H) 12/08/2020   Urine Drug Screen:     Component Value Date/Time   LABOPIA NONE DETECTED 08/12/2023 1300   COCAINSCRNUR NONE DETECTED 08/12/2023 1300   LABBENZ NONE DETECTED 08/12/2023 1300   AMPHETMU NONE DETECTED 08/12/2023 1300   THCU NONE DETECTED 08/12/2023 1300   LABBARB NONE DETECTED 08/12/2023 1300     Imaging(Personally reviewed): MRI-I question mild hippocampal diffusion change on the right, but feel this is unclear CTA is negative   Impression   Steven Meadows is a 89 y.o. male presenting with fever, altered mental status, focal neurological deficits with negative imaging.  He has been started on empiric CNS coverage, and I do have significant concern that this is a possible CNS infection.  With his history of previous stroke and encephalitis, and significant atrophy on imaging, it is possible that  he is having recrudescence in the setting of some other as yet unidentified infectious process.  He would have last received his Eliquis in the evening of 4/8 and therefore we will have to wait until tomorrow for lumbar puncture.  Recommendations  Plan for LP tomorrow for cells, protein, glucose, cultures, meningoencephalitis panel Continue empiric antibiotics until that time Overnight EEG today Neurology will continue to follow. ______________________________________________________________________   Thank you for the opportunity to take part in the care of this patient. If you have any further questions, please contact the neurology consultation team on call. Updated oncall schedule is listed on AMION.  Signed,  Ritta Slot Neurohospitalist

## 2023-08-13 NOTE — Progress Notes (Signed)
 Pharmacy Antibiotic Note  Steven Meadows is a 89 y.o. male admitted on 08/12/2023 with sepsis of unknown source of infection. Pharmacy has been consulted for vancomycin and acyclovir dosing. Antibiotics for encephalitis treatment.   4/9: WBC 21.4, lactate 3.5, Scr 1.50, Temperature 36.3C  Plan: Vancomycin 1000 mg IV every 24 hours (eAUC 455, Scr 1.5, IBW) Ceftriaxone 2 gm IV q12 hours Ampicillin 2G IV q6 hours Acyclovir 900 mg (10mg /kg) q12 hours Metronidazole per MD Monitor renal function and vancomycin levels as appropriate Monitor clinical response and de-escalate antibiotics as appropriate  Height: 5\' 11"  (180.3 cm) Weight: 90 kg (198 lb 6.6 oz) IBW/kg (Calculated) : 75.3  Temp (24hrs), Avg:99 F (37.2 C), Min:97.2 F (36.2 C), Max:102.2 F (39 C)  Recent Labs  Lab 08/12/23 1037 08/12/23 1044 08/12/23 1200 08/12/23 1412 08/13/23 0406  WBC 21.4*  --   --   --  15.1*  CREATININE 1.50* 1.50*  --   --  1.18  LATICACIDVEN  --   --  3.5* 3.8*  --     Estimated Creatinine Clearance: 45.2 mL/min (by C-G formula based on SCr of 1.18 mg/dL).    Allergies  Allergen Reactions   Nabumetone Other (See Comments)    Gi upset   Ace Inhibitors Cough   Flonase [Fluticasone Propionate] Other (See Comments)    Causes nosebleeds    Antimicrobials this admission: Vancomycin 4/9 >> Cefepime 4/9 x1 Metronidazole 4/9 >> Ceftriaxone 4/9>> Ampicillin 4/9>> Acyclovir 4/9>>   Microbiology results: 4/9 Bcx: 4/9 RVP:  Thank you for allowing pharmacy to be a part of this patient's care.  Ruben Im, PharmD Clinical Pharmacist 08/13/2023 9:00 AM Please check AMION for all [facility] Pharmacy numbers

## 2023-08-13 NOTE — Progress Notes (Signed)
 PHARMACY - ANTICOAGULATION CONSULT NOTE  Pharmacy Consult for heparin Indication: atrial fibrillation (on Eliquis PTA)  Allergies  Allergen Reactions   Nabumetone Other (See Comments)    Gi upset   Ace Inhibitors Cough   Flonase [Fluticasone Propionate] Other (See Comments)    Causes nosebleeds    Patient Measurements: Height: 5\' 11"  (180.3 cm) Weight: 90 kg (198 lb 6.6 oz) IBW/kg (Calculated) : 75.3 HEPARIN DW (KG): 90  Vital Signs: Temp: 99.3 F (37.4 C) (04/10 1322) Temp Source: Oral (04/10 1322) BP: 144/91 (04/10 1322) Pulse Rate: 98 (04/10 1322)  Labs: Recent Labs    08/12/23 1037 08/12/23 1044 08/13/23 0406 08/13/23 0408 08/13/23 1403  HGB 15.7 16.7 14.6  --   --   HCT 48.0 49.0 43.8  --   --   PLT 246  --  208  --   --   APTT 38*  --  87*  --  75*  LABPROT 16.0*  --   --   --   --   INR 1.3*  --   --   --   --   HEPARINUNFRC  --   --   --  >1.10*  --   CREATININE 1.50* 1.50* 1.18  --   --   CKTOTAL 3,367*  --   --   --   --     Estimated Creatinine Clearance: 45.2 mL/min (by C-G formula based on SCr of 1.18 mg/dL).  Assessment: 61 YOM presenting with acute encephalopathy and concern for sepsis of unknown source of infection. Patient has a PMH significant for atrial fibrillation on Eliquis PTA, last dose given 4/8 PM. Per MD, patient may need a lumbar puncture, plan to hold Eliquis and transition patient to heparin. Pharmacy consulted to assist with heparin dosing. Due to recent Eliquis administration, will monitor aPTT and anti-Xa levels until correlating.   Patient received dose of lovenox 40 mg in the ED on 08/12/23.   aPTT = 75 seconds, therapeutic on 1200 units/hr. No signs/symptoms of bleeding reported.  CBC within normal limits.   Goal of Therapy:  Heparin level 0.3-0.7 units/ml aPTT 66-102 seconds Monitor platelets by anticoagulation protocol: Yes   Plan:  Continue Heparin at 1200 units/hr Monitor aPTT and anti-Xa level daily until correlating   Monitor CBC and s/sx of bleeding daily F/u when to restart Eliquis  Noah Delaine, RPh Clinical Pharmacist 08/13/2023 3:27 PM

## 2023-08-13 NOTE — Progress Notes (Incomplete)
 Pharmacy Antibiotic Note  Steven Meadows is a 89 y.o. male admitted on 08/12/2023 with sepsis of unknown source of infection. Pharmacy has been consulted for vancomycin and acyclovir dosing. Antibiotics for encephalitis treatment.   4/9: WBC 21.4, lactate 3.5, Scr 1.50, Temperature 36.3C  Plan: Vancomycin to 750 mg IV q12h,  next dose due 4/11 at 0600.  Ceftriaxone 2 gm IV q12 hours Ampicillin 2G IV q6 hours Acyclovir 900 mg (10mg /kg) q12 hours Metronidazole per MD Monitor renal function and vancomycin levels as appropriate Monitor clinical response and de-escalate antibiotics as appropriate  Height: 5\' 11"  (180.3 cm) Weight: 90 kg (198 lb 6.6 oz) IBW/kg (Calculated) : 75.3  Temp (24hrs), Avg:99.8 F (37.7 C), Min:98 F (36.7 C), Max:102.2 F (39 C)  Recent Labs  Lab 08/12/23 1037 08/12/23 1044 08/12/23 1200 08/12/23 1412 08/13/23 0406  WBC 21.4*  --   --   --  15.1*  CREATININE 1.50* 1.50*  --   --  1.18  LATICACIDVEN  --   --  3.5* 3.8*  --     Estimated Creatinine Clearance: 45.2 mL/min (by C-G formula based on SCr of 1.18 mg/dL).    Allergies  Allergen Reactions   Nabumetone Other (See Comments)    Gi upset   Ace Inhibitors Cough   Flonase [Fluticasone Propionate] Other (See Comments)    Causes nosebleeds    Antimicrobials this admission: Vancomycin 4/9 >> Cefepime 4/9 x1 Metronidazole 4/9 >> Ceftriaxone 4/9>> Ampicillin 4/9>> Acyclovir 4/9>>   Microbiology results: 4/9 Bcx: 4/9 RVP:  Thank you for allowing pharmacy to be a part of this patient's care.  Ruben Im, PharmD Clinical Pharmacist 08/13/2023 3:46 PM Please check AMION for all [facility] Pharmacy numbers

## 2023-08-13 NOTE — Progress Notes (Signed)
 LTM EEG hooked up and running - no initial skin breakdown - push button tested - No [facility]/monitoring in the lab

## 2023-08-13 NOTE — Plan of Care (Signed)

## 2023-08-13 NOTE — Progress Notes (Signed)
 Pharmacy Antibiotic Note  Steven Meadows is a 89 y.o. male admitted on 08/12/2023 with sepsis of unknown source of infection. Pharmacy has been consulted for vancomycin and acyclovir dosing. Antibiotics for encephalitis treatment.   4/9: WBC 21.4>15.1, lactate 3.5>3.8, Scr 1.50 improved to 1.18 Tc 99.8 BCID  1/4 blood cx grew Staphylococcus epidermidis in blood , Methicillin resistant.    Plan: Adjust Vancomycin to 750 mg IV q12h, for improved SCr. Give next dose at -6AM on 08/14/23. Ceftriaxone 2 gm IV q12 hours Ampicillin 2G IV q6 hours Acyclovir 900 mg (10mg /kg) q12 hours Metronidazole per MD Monitor renal function and vancomycin levels as appropriate Monitor clinical response and de-escalate antibiotics as appropriate  Height: 5\' 11"  (180.3 cm) Weight: 90 kg (198 lb 6.6 oz) IBW/kg (Calculated) : 75.3  Temp (24hrs), Avg:99.8 F (37.7 C), Min:98 F (36.7 C), Max:102.2 F (39 C)  Recent Labs  Lab 08/12/23 1037 08/12/23 1044 08/12/23 1200 08/12/23 1412 08/13/23 0406  WBC 21.4*  --   --   --  15.1*  CREATININE 1.50* 1.50*  --   --  1.18  LATICACIDVEN  --   --  3.5* 3.8*  --     Estimated Creatinine Clearance: 45.2 mL/min (by C-G formula based on SCr of 1.18 mg/dL).    Allergies  Allergen Reactions   Nabumetone Other (See Comments)    Gi upset   Ace Inhibitors Cough   Flonase [Fluticasone Propionate] Other (See Comments)    Causes nosebleeds    Antimicrobials this admission: Cefepime 4/9 x1 Vancomycin 4/9 >> Metronidazole 4/9 >> Ceftriaxone 4/9>> Ampicillin 4/9>> Acyclovir 4/9>>   Microbiology results: 4/9 BCx: GPC, pending  BCID  staph epidermidis detected-   MecA detected 4/9 RVP:   Thank you for allowing pharmacy to be a part of this patient's care.  Noah Delaine, RPh Clinical Pharmacist 08/13/2023 4:03 PM Please check AMION for all [facility] Pharmacy numbers

## 2023-08-13 NOTE — Progress Notes (Signed)
 EEg with PLEDs with intermittent seizures, I do think this is likley contributing to his symptoms. I will load with keppra 4g x 1, and then continue at 1g BID, will also give ativan 1mg  x 1.  Ritta Slot, MD [facility]   If 7pm- 7am, please page neurology on call as listed in AMION.

## 2023-08-13 NOTE — Progress Notes (Signed)
 PHARMACY - PHYSICIAN COMMUNICATION CRITICAL VALUE ALERT - BLOOD CULTURE IDENTIFICATION (BCID)  Steven Meadows is an 89 y.o. male who presented to [facility] on 08/12/2023 with a chief complaint of unresponsive.  Assessment:  1/4 Staphylococcus epidermidis in blood. No long term indwelling lines or catheters. Does not appear to have hardware. Likely this represents contamination at this time. Patient with adequate coverage via Vancomycin/Ampicillin/Ceftriaxone/Metronidazole for broad spectrum coverage, to include CNS/meningitis. Recommend continuing this at this time.  Name of physician (or Provider) Contacted: Dr. Tyson Babinski  Current antibiotics: Vancomycin/Ampicillin/Ceftriaxone/Metronidazole  Changes to prescribed antibiotics recommended:  Patient is on recommended antibiotics - No changes needed  Results for orders placed or performed during the hospital encounter of 08/12/23  Blood Culture ID Panel (Reflexed) (Collected: 08/12/2023 11:55 AM)  Result Value Ref Range   Enterococcus faecalis NOT DETECTED NOT DETECTED   Enterococcus Faecium NOT DETECTED NOT DETECTED   Listeria monocytogenes NOT DETECTED NOT DETECTED   Staphylococcus species DETECTED (A) NOT DETECTED   Staphylococcus aureus (BCID) NOT DETECTED NOT DETECTED   Staphylococcus epidermidis DETECTED (A) NOT DETECTED   Staphylococcus lugdunensis NOT DETECTED NOT DETECTED   Streptococcus species NOT DETECTED NOT DETECTED   Streptococcus agalactiae NOT DETECTED NOT DETECTED   Streptococcus pneumoniae NOT DETECTED NOT DETECTED   Streptococcus pyogenes NOT DETECTED NOT DETECTED   A.calcoaceticus-baumannii NOT DETECTED NOT DETECTED   Bacteroides fragilis NOT DETECTED NOT DETECTED   Enterobacterales NOT DETECTED NOT DETECTED   Enterobacter cloacae complex NOT DETECTED NOT DETECTED   Escherichia coli NOT DETECTED NOT DETECTED   Klebsiella aerogenes NOT DETECTED NOT DETECTED   Klebsiella oxytoca NOT DETECTED NOT DETECTED   Klebsiella  pneumoniae NOT DETECTED NOT DETECTED   Proteus species NOT DETECTED NOT DETECTED   Salmonella species NOT DETECTED NOT DETECTED   Serratia marcescens NOT DETECTED NOT DETECTED   Haemophilus influenzae NOT DETECTED NOT DETECTED   Neisseria meningitidis NOT DETECTED NOT DETECTED   Pseudomonas aeruginosa NOT DETECTED NOT DETECTED   Stenotrophomonas maltophilia NOT DETECTED NOT DETECTED   Candida albicans NOT DETECTED NOT DETECTED   Candida auris NOT DETECTED NOT DETECTED   Candida glabrata NOT DETECTED NOT DETECTED   Candida krusei NOT DETECTED NOT DETECTED   Candida parapsilosis NOT DETECTED NOT DETECTED   Candida tropicalis NOT DETECTED NOT DETECTED   Cryptococcus neoformans/gattii NOT DETECTED NOT DETECTED   Methicillin resistance mecA/C DETECTED (A) NOT DETECTED    Dalene Carrow 08/13/2023  8:03 AM

## 2023-08-13 NOTE — TOC Initial Note (Signed)
 Transition of Care [facility]) - Initial/Assessment Note    Patient Details  Name: Steven Meadows MRN: [id number] Date of Birth: Jul 23, 1931  Transition of Care [facility]) CM/SW Contact:    Mearl Latin, LCSW Phone Number: 08/13/2023, 3:23 PM  Clinical Narrative:                 Patient admitted from [facility]. CSW left voicemail for [place] there. Continuing to follow.    Barriers to Discharge: Continued Medical Work up   Patient Goals and CMS Choice            Expected Discharge Plan and Services In-house Referral: Clinical Social Work     Living arrangements for the past 2 months: Assisted Living Facility                                      Prior Living Arrangements/Services Living arrangements for the past 2 months: Assisted Living Facility Lives with:: Facility Resident Patient language and need for interpreter reviewed:: Yes        Need for Family Participation in Patient Care: Yes (Comment) Care giver support system in place?: Yes (comment)   Criminal Activity/Legal Involvement Pertinent to Current Situation/Hospitalization: No - Comment as needed  Activities of Daily Living   ADL Screening (condition at time of admission) Independently performs ADLs?: No Does the patient have a NEW difficulty with bathing/dressing/toileting/self-feeding that is expected to last >3 days?: Yes (Initiates electronic notice to provider for possible OT consult) Does the patient have a NEW difficulty with getting in/out of bed, walking, or climbing stairs that is expected to last >3 days?: Yes (Initiates electronic notice to provider for possible PT consult) Does the patient have a NEW difficulty with communication that is expected to last >3 days?: No Is the patient deaf or have difficulty hearing?: Yes Does the patient have difficulty seeing, even when wearing glasses/contacts?: No Does the patient have difficulty concentrating, remembering, or making decisions?:  Yes  Permission Sought/Granted                  Emotional Assessment Appearance:: Appears stated age Attitude/Demeanor/Rapport: Unable to Assess Affect (typically observed): Unable to Assess Orientation: : Oriented to Place, Oriented to Self Alcohol / Substance Use: Not Applicable Psych Involvement: No (comment)  Admission diagnosis:  Unresponsive [R41.89] Acute encephalopathy [G93.40] AKI (acute kidney injury) (HCC) [N17.9] Non-traumatic rhabdomyolysis [M62.82] Patient Active Problem List   Diagnosis Date Noted   Acute encephalopathy 08/12/2023   Palliative care encounter 08/07/2021   Hyponatremia    Hypokalemia    Slow transit constipation    ICH (intracerebral hemorrhage) (HCC) 04/30/2020   Renal insufficiency 08/18/2019   Chronic anticoagulation 01/23/2015   HLD (hyperlipidemia) 01/23/2015   Essential hypertension 01/23/2015   Brain aneurysm 01/23/2015   History of CVA (cerebrovascular accident) 12/21/2014   Chronic atrial fibrillation (HCC) 12/21/2014   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   Cough 06/17/2011   PCP:  Oneita Hurt, No Pharmacy:   [facility] - [place], [place] - [address] [place] [address] Phone: [phone] Fax: [phone]     Social Drivers of Health (SDOH) Social History: SDOH Screenings   Food Insecurity: No Food Insecurity (08/13/2023)  Housing: High Risk (08/13/2023)  Transportation Needs: No Transportation Needs (08/13/2023)  Utilities: Not At Risk (08/13/2023)  Depression (PHQ2-9): Medium Risk (06/07/2020)  Social Connections: Moderately  Isolated (08/13/2023)  Tobacco Use: Low Risk  (08/12/2023)   SDOH Interventions:     Readmission Risk Interventions     No data to display

## 2023-08-13 NOTE — Hospital Course (Addendum)
 Steven Meadows is a 89 y.o. male with past medical history significant of hypertension, hyperlipidemia, atrial fibrillation, stroke, intracerebral hemorrhage, renal disease presented to hospital after found to be unresponsive at this assisted living facility.  He had been having progressive weakness and was found facedown on the floor.  He was noted to be hypoxic with pulse ox of 84% on room air and was transferred to the ED.  In the ED patient had temperature of 97.4,  blood pressure in the 170 systolic,.  Labs showed mild hyponatremia with sodium of 133 and creatinine of 1.5 from baseline 1.0.  AST was slightly elevated at 49.  CBC showed leukocytosis at 21.4.   PT and INR elevated at 16 and 1.3 respectively.  PTT elevated to 38.  CK elevated to 3367.  Lactic acid initially elevated at 3.5  Respiratory panel for flu COVID and RSV was negative.  Urinalysis showed no white cells.  Ethanol level negative.   CT head, CT spine showed no acute normality.  Chest x-ray without infiltrate.  Pelvis x-ray showed no acute abnormality.  Patient was started on broad-spectrum antibiotics including vancomycin, cefepime, Flagyl in the ED.  Also received 1 L IV fluids and was started on a rate 150 cc an hour.  Patient was then admitted hospital for further evaluation and treatment.   Acute metabolic encephalopathy  Patient found unresponsive by staff at assisted living facility this morning after being noted to be normal by other staff between 6 and 7 AM.  Was hypoxic on presentation.  Uncertain whether there was  postictal/seizure etiology.  Patient is a DNR/DNI with a MOST form wanting antibiotics and fluids.  Continue broad-spectrum antibiotic for now with ampicillin, Rocephin and vancomycin..  Continue supportive care.  EEG showed diffuse encephalopathy.  MRI of the brain did not show acute findings but extensive chronic microvascular ischemic changes.  Continue seizure precautions.  Neurology has seen the patient and  recommend overnight EEG with empiric CNS coverage.  Antiepileptic if EEG showed seizure.  Plan for lumbar puncture tomorrow.  Sepsis   Possible sepsis given temperature 97.4, tachycardia, tachypnea, leukocytosis 21.4 and L.  Lactate at 3.5.  No obvious source of infection but patient is altered so we will need to rule out CNS infection.  Neurology recommending lumbar puncture tomorrow.  Overnight EEG today.  Continue broad-spectrum antibiotic.  Supportive care.    History of CVA MRI without any acute findings.  History of atrial fibrillation.  Has been started on heparin drip.  Was on Eliquis at home.  Currently on hold.  Might need lumbar puncture.   Hypertension Hypertensives on hold.   Hyperlipidemia - Zetia and Crestor on hold.

## 2023-08-13 NOTE — Progress Notes (Signed)
 PHARMACY - ANTICOAGULATION CONSULT NOTE  Pharmacy Consult for heparin Indication: atrial fibrillation (on Eliquis PTA)  Allergies  Allergen Reactions   Nabumetone Other (See Comments)    Gi upset   Ace Inhibitors Cough   Flonase [Fluticasone Propionate] Other (See Comments)    Causes nosebleeds    Patient Measurements:    Vital Signs: Temp: 102.2 F (39 C) (04/10 0351) Temp Source: Rectal (04/10 0351) BP: 168/115 (04/10 0230) Pulse Rate: 112 (04/10 0230)  Labs: Recent Labs    08/12/23 1037 08/12/23 1044 08/13/23 0406 08/13/23 0408  HGB 15.7 16.7 14.6  --   HCT 48.0 49.0 43.8  --   PLT 246  --  208  --   APTT 38*  --  87*  --   LABPROT 16.0*  --   --   --   INR 1.3*  --   --   --   HEPARINUNFRC  --   --   --  >1.10*  CREATININE 1.50* 1.50*  --   --   CKTOTAL 3,367*  --   --   --     CrCl cannot be calculated (Unknown ideal weight.).  Assessment: 75 YOM presenting with acute encephalopathy and concern for sepsis of unknown source of infection. Patient has a PMH significant for atrial fibrillation on Eliquis PTA, last dose given 4/8 PM. Per MD, patient may need a lumbar puncture, plan to hold Eliquis and transition patient to heparin. Pharmacy consulted to assist with heparin dosing. Due to recent Eliquis administration, will monitor aPTT and anti-Xa levels until correlating.   4/9: Patient received dose of lovenox 40 mg in the ED. Hgb 16.7, PLT 246  AM aPTT therapeutic on 1200 units/hr. No signs/symptoms of bleeding reported with last CBC stable  Goal of Therapy:  Heparin level 0.3-0.7 units/ml aPTT 66-102 seconds Monitor platelets by anticoagulation protocol: Yes   Plan:  Continue Heparin at 1200 units/hr 8h aPTT Monitor aPTT and anti-Xa level daily until correlating  Monitor CBC and s/sx of bleeding daily F/u when to restart Eliquis  Arabella Merles, PharmD. Clinical Pharmacist 08/13/2023 4:42 AM

## 2023-08-14 ENCOUNTER — Inpatient Hospital Stay (HOSPITAL_COMMUNITY)

## 2023-08-14 DIAGNOSIS — I5031 Acute diastolic (congestive) heart failure: Secondary | ICD-10-CM

## 2023-08-14 DIAGNOSIS — R509 Fever, unspecified: Secondary | ICD-10-CM | POA: Diagnosis not present

## 2023-08-14 DIAGNOSIS — R4182 Altered mental status, unspecified: Secondary | ICD-10-CM | POA: Diagnosis not present

## 2023-08-14 DIAGNOSIS — R569 Unspecified convulsions: Secondary | ICD-10-CM | POA: Diagnosis not present

## 2023-08-14 DIAGNOSIS — R29818 Other symptoms and signs involving the nervous system: Secondary | ICD-10-CM | POA: Diagnosis not present

## 2023-08-14 DIAGNOSIS — G9340 Encephalopathy, unspecified: Secondary | ICD-10-CM | POA: Diagnosis not present

## 2023-08-14 LAB — MENINGITIS/ENCEPHALITIS PANEL (CSF)

## 2023-08-14 LAB — APTT: aPTT: 68 s — ABNORMAL HIGH (ref 24–36)

## 2023-08-14 LAB — CSF CELL COUNT WITH DIFFERENTIAL
RBC Count, CSF: 23 /mm3 — ABNORMAL HIGH
RBC Count, CSF: 6 /mm3 — ABNORMAL HIGH
Tube #: 1
Tube #: 4
WBC, CSF: 2 /mm3 (ref 0–5)
WBC, CSF: 4 /mm3 (ref 0–5)

## 2023-08-14 LAB — BASIC METABOLIC PANEL WITH GFR
Anion gap: 11 (ref 5–15)
BUN: 16 mg/dL (ref 8–23)
CO2: 23 mmol/L (ref 22–32)
Calcium: 8.3 mg/dL — ABNORMAL LOW (ref 8.9–10.3)
Chloride: 100 mmol/L (ref 98–111)
Creatinine, Ser: 1.08 mg/dL (ref 0.61–1.24)
GFR, Estimated: >60 mL/min (ref >60)
Glucose, Bld: 112 mg/dL — ABNORMAL HIGH (ref 70–99)
Potassium: 3.2 mmol/L — ABNORMAL LOW (ref 3.5–5.1)
Sodium: 134 mmol/L — ABNORMAL LOW (ref 135–145)

## 2023-08-14 LAB — CBC
HCT: 37.4 % — ABNORMAL LOW (ref 39.0–52.0)
Hemoglobin: 12.8 g/dL — ABNORMAL LOW (ref 13.0–17.0)
MCH: 29.4 pg (ref 26.0–34.0)
MCHC: 34.2 g/dL (ref 30.0–36.0)
MCV: 85.8 fL (ref 80.0–100.0)
Platelets: 158 10*3/uL (ref 150–400)
RBC: 4.36 MIL/uL (ref 4.22–5.81)
RDW: 13.9 % (ref 11.5–15.5)
WBC: 12 10*3/uL — ABNORMAL HIGH (ref 4.0–10.5)
nRBC: 0 % (ref 0.0–0.2)

## 2023-08-14 LAB — C-REACTIVE PROTEIN: CRP: 11.1 mg/dL — ABNORMAL HIGH (ref <1.0)

## 2023-08-14 LAB — ECHOCARDIOGRAM COMPLETE
Height: 71 in
Weight: 3174.62 [oz_av]

## 2023-08-14 LAB — PROTEIN AND GLUCOSE, CSF
Glucose, CSF: 69 mg/dL (ref 40–70)
Total  Protein, CSF: 72 mg/dL — ABNORMAL HIGH (ref 15–45)

## 2023-08-14 LAB — HEPARIN LEVEL (UNFRACTIONATED): Heparin Unfractionated: 0.95 [IU]/mL — ABNORMAL HIGH (ref 0.30–0.70)

## 2023-08-14 LAB — MAGNESIUM: Magnesium: 1.8 mg/dL (ref 1.7–2.4)

## 2023-08-14 LAB — MRSA NEXT GEN BY PCR, NASAL: MRSA by PCR Next Gen: NOT DETECTED

## 2023-08-14 LAB — PROCALCITONIN: Procalcitonin: 0.28 ng/mL

## 2023-08-14 LAB — BRAIN NATRIURETIC PEPTIDE: B Natriuretic Peptide: 295.7 pg/mL — ABNORMAL HIGH (ref 0.0–100.0)

## 2023-08-14 MED ORDER — SODIUM CHLORIDE 0.9 % IV SOLN
100.0000 mg | Freq: Two times a day (BID) | INTRAVENOUS | Status: DC
  Administered 2023-08-14 – 2023-08-16 (×5): 100 mg via INTRAVENOUS
  Filled 2023-08-14 (×6): qty 10

## 2023-08-14 MED ORDER — POTASSIUM CHLORIDE 10 MEQ/100ML IV SOLN
10.0000 meq | INTRAVENOUS | Status: AC
  Administered 2023-08-14 (×4): 10 meq via INTRAVENOUS
  Filled 2023-08-14 (×4): qty 100

## 2023-08-14 MED ORDER — DILTIAZEM HCL 25 MG/5ML IV SOLN: 10.0000 mg | Freq: Four times a day (QID) | INTRAVENOUS | Status: DC | PRN

## 2023-08-14 MED ORDER — DEXTROSE-SODIUM CHLORIDE 5-0.45 % IV SOLN: INTRAVENOUS | Status: DC

## 2023-08-14 MED ORDER — LACTATED RINGERS IV SOLN: INTRAVENOUS | Status: DC

## 2023-08-14 MED ORDER — PANTOPRAZOLE SODIUM 40 MG IV SOLR
40.0000 mg | INTRAVENOUS | Status: DC
  Administered 2023-08-14 – 2023-08-20 (×7): 40 mg via INTRAVENOUS
  Filled 2023-08-14 (×8): qty 10

## 2023-08-14 NOTE — TOC Progression Note (Signed)
 Transition of Care [facility]) - Progression Note    Patient Details  Name: Steven Meadows MRN: [id number] Date of Birth: [birth year]  Transition [facility]) CM/SW Contact  Gordy Clement, RN Phone Number: 08/14/2023, 3:49 PM  Clinical Narrative:     Following for TOC needs. Patient from ALF and being recommended the following equipment:   Wheelchair and cushion  [facility] bed.   Will coordinate with CSW  DO not anticipate a DC for 7-10 days per Progression       Barriers to Discharge: Continued Medical Work up  Expected Discharge Plan and Services In-house Referral: Clinical Social Work     Living arrangements for the past 2 months: Assisted Living Facility                                       Social Determinants of Health (SDOH) Interventions SDOH Screenings   Food Insecurity: No Food Insecurity (08/13/2023)  Housing: High Risk (08/13/2023)  Transportation Needs: No Transportation Needs (08/13/2023)  Utilities: Not At Risk (08/13/2023)  Depression (PHQ2-9): Medium Risk (06/07/2020)  Social Connections: Moderately Isolated (08/13/2023)  Tobacco Use: Low Risk  (08/12/2023)    Readmission Risk Interventions     No data to display

## 2023-08-14 NOTE — Progress Notes (Signed)
 NEUROLOGY CONSULT FOLLOW UP NOTE   Date of service: August 14, 2023 Patient Name: Steven Meadows MRN:  [id number] DOB:  January 04, 1932  Brief HPI  Steven Meadows is a 89 y.o. male with a history of previous stroke, encephalitis at age 86, atrial fibrillation on Eliquis, recent decline who presented on 08/12/2023 with decreased responsiveness.  On my assessment yesterday, he was improving and no longer unresponsive, but he did have a right gaze preference, and it apparent left neglect as well.  MRI and CTA have been negative on 4/9.  Routine EEG on 4/9 was negative.  He has an elevated white count and elevated fever to 102.2.  He was started on empiric CNS coverage/9   Interval Hx/subjective   Patient is more sedated than when I saw him yesterday, but otherwise relatively similar.  He has not had any further seizures since yesterday afternoon when he received loading doses of antiepileptics.  Vitals   Vitals:   08/14/23 0402 08/14/23 0502 08/14/23 0800 08/14/23 1100  BP: (!) 137/94  (!) 144/96 (!) 152/106  Pulse: (!) 101 97 94 (!) 106  Resp: 20 20 20 12   Temp: 99.1 F (37.3 C)  98.9 F (37.2 C)   TempSrc: Axillary  Axillary   SpO2: 96% 95% 95% 95%  Weight:      Height:         Body mass index is 27.67 kg/m.  Physical Exam   General, in bed, NAD  Neurologic Examination   Gen: in bed, NAD  Neuro: MS: He is somnolent but arousable able to answer simple questions and follow commands. CN: He has a right gaze preference, does not cross midline to the left, Motor: He does not move the left side is much as the right, appears to have a mild left hemiparesis but is able to squeeze his fingers on the left. Sensory: He reports sensation bilaterally  Labs and Diagnostic Imaging   CBC:  Recent Labs  Lab 08/12/23 1037 08/12/23 1044 08/13/23 0406 08/14/23 1003  WBC 21.4*  --  15.1* 12.0*  NEUTROABS 17.4*  --   --   --   HGB 15.7   < > 14.6 12.8*  HCT 48.0   < > 43.8 37.4*  MCV 87.6   --  85.0 85.8  PLT 246  --  208 158   < > = values in this interval not displayed.    Basic Metabolic Panel:  Lab Results  Component Value Date   NA 134 (L) 08/14/2023   K 3.2 (L) 08/14/2023   CO2 23 08/14/2023   GLUCOSE 112 (H) 08/14/2023   BUN 16 08/14/2023   CREATININE 1.08 08/14/2023   CALCIUM 8.3 (L) 08/14/2023   GFRNONAA >60 08/14/2023   GFRAA 61 08/18/2019   Lipid Panel:  Lab Results  Component Value Date   LDLCALC 98 12/08/2020   HgbA1c:  Lab Results  Component Value Date   HGBA1C 6.0 (H) 12/08/2020   Urine Drug Screen:     Component Value Date/Time   LABOPIA NONE DETECTED 08/12/2023 1300   COCAINSCRNUR NONE DETECTED 08/12/2023 1300   LABBENZ NONE DETECTED 08/12/2023 1300   AMPHETMU NONE DETECTED 08/12/2023 1300   THCU NONE DETECTED 08/12/2023 1300   LABBARB NONE DETECTED 08/12/2023 1300     Imaging(Personally reviewed): MRI-he has subtle diffusion change in the left medial temporal lobe CTA is negative   Impression   Steven Meadows is a 89 y.o. male presenting with fever, altered mental  status, focal neurological deficits with negative imaging.  He has been started on empiric CNS coverage, and I do have significant concern that this is a possible CNS infection, but it could be that his neurological exam was completely explained by seizures.    He was found to have recurrent focal right hemispheric seizures.  On MRI, there is some subtle restricted diffusion change in the right hemisphere, I suspect that this is more likely related to seizure effect than stroke.  He has responded to some degree to antiepileptics, but continues to have an ictal/interictal continuum pattern.  Due to this I added Vimpat overnight and would favor continuing it    Recommendations  Plan for LP for cells, protein, glucose, cultures, meningoencephalitis panel Continue empiric antibiotics until results are available Overnight EEG  Continue levetiracetam at 1500 mg twice  daily Continue lacosamide at 100 mg twice daily Neurology will continue to follow. ______________________________________________________________________   Thank you for the opportunity to take part in the care of this patient. If you have any further questions, please contact the neurology consultation team on call. Updated oncall schedule is listed on AMION.  Signed,  Ritta Slot Neurohospitalist

## 2023-08-14 NOTE — Evaluation (Signed)
 Physical Therapy Evaluation Patient Details Name: Steven Meadows MRN: [id number] DOB: 1931-05-19 Today's Date: 08/14/2023  History of Present Illness  Steven Meadows is a 89 y.o. male admitted 08/12/23 for acute encephalopathy. Pt presented after found unresponsive at his assisted living facility and was noted to be hypoxic. EEG showed five seizures without clinical signs arising from right hemisphere lasting about 2 minutes each, epileptogenicity arising from right hemisphere, maximal right posterior quadrant, and moderate diffuse encephalopathy. This EEG pattern is on the ictal-interictal continuum with high risk of seizure recurrence. MRI brain demonstrated subtle diffusion signal abnormality in right hippocampus and right dorsal thalamus. Findings are concerning for acute to early subacute infarcts. PMH significant for HTN, HLD, atrial fibrillation, CVA, intracerebral hemorrhage, renal disease, and encephalitis at age 61.   Clinical Impression  Pt admitted with above diagnosis. Examination limited secondary to pt's impaired cognition and lack of family member present. He resides at [facility]. Pt currently with functional limitations due to the deficits listed below (see PT Problem List). He is in a nearly obtunded state and unable to follow any commands. Pt had BLE PROM WFL. Observed slight visual and palpable activation of LLE but none of RLE. Unable to attempt bed mobility at this time d/t safety. Pt will benefit from acute skilled PT to increase his independence and safety with mobility to allow discharge. Recommend continued inpatient follow up therapy, <3 hours/day.    If plan is discharge home, recommend the following: Two people to help with walking and/or transfers;Two people to help with bathing/dressing/bathroom;Assistance with cooking/housework;Assistance with feeding;Direct supervision/assist for medications management;Direct supervision/assist for financial management;Assist for  transportation;Help with stairs or ramp for entrance;Supervision due to cognitive status   Can travel by private vehicle   No    Equipment Recommendations Wheelchair (measurements PT);Wheelchair cushion (measurements PT);Hospital bed;Hoyer lift  Recommendations for Other Services       Functional Status Assessment Patient has had a recent decline in their functional status and demonstrates the ability to make significant improvements in function in a reasonable and predictable amount of time.     Precautions / Restrictions Precautions Precautions: Fall Recall of Precautions/Restrictions: Impaired Restrictions Weight Bearing Restrictions Per Provider Order: No      Mobility  Bed Mobility Overal bed mobility: Needs Assistance             General bed mobility comments: Deferred d/t pt's impaired cognitive status and lack of second person present to assist.    Transfers                        Ambulation/Gait                  Stairs            Wheelchair Mobility     Tilt Bed    Modified Rankin (Stroke Patients Only)       Balance Overall balance assessment: History of Falls (Session remained bed level)                                           Pertinent Vitals/Pain Pain Assessment Pain Assessment: Faces Faces Pain Scale: No hurt Pain Intervention(s): Monitored during session    Home Living Family/patient expects to be discharged to:: Assisted living [facility])  Home Equipment: Rollator (4 wheels);Wheelchair - manual Additional Comments: Pt gets staff assist for bed mobility, bathing, dressing, to get to dining room. Can walk short distances - but also uses WC, fatigues quickly.    Prior Function Prior Level of Function : Needs assist (confirmed with RN at [facility])             Mobility Comments: can walk short distances with Rollator, also uses WC. Assist to get to dining room  and for bed mobility in the morning. ADLs Comments: assist for bathing (1x/wk) and dressing. Set up for grooming/eating. ALF manages meds     Extremity/Trunk Assessment   Upper Extremity Assessment Upper Extremity Assessment: Defer to OT evaluation    Lower Extremity Assessment Lower Extremity Assessment: Difficult to assess due to impaired cognition;RLE deficits/detail;LLE deficits/detail RLE Deficits / Details: PROM WFL. No visual or palpable contractions noted. LLE Deficits / Details: PROM WFL. Felt resistance when brining knee to chest, moving ankle, or going into hip Abd. Slight visual or palpable contractions noted.       Communication   Communication Communication: Impaired Factors Affecting Communication: Difficulty expressing self;Reduced clarity of speech    Cognition Arousal: Obtunded Behavior During Therapy: Flat affect   PT - Cognitive impairments: No family/caregiver present to determine baseline, Difficult to assess Difficult to assess due to: Level of arousal                     PT - Cognition Comments: Pt with nonsensical speech throughout session. Pt kept eyes closed throughout session, limited response to stimulation. Following commands: Impaired Following commands impaired:  (No command following noted.)     Cueing Cueing Techniques: Verbal cues, Tactile cues     General Comments General comments (skin integrity, edema, etc.): Pt with 2+ pitting edema in RLE. No skin integrity concerns in visible areas at this time.    Exercises     Assessment/Plan    PT Assessment Patient needs continued PT services  PT Problem List Decreased strength;Decreased activity tolerance;Decreased balance;Decreased mobility;Decreased cognition;Decreased knowledge of use of DME;Decreased safety awareness       PT Treatment Interventions DME instruction;Wheelchair mobility training;Functional mobility training;Therapeutic activities;Balance training;Therapeutic  exercise;Patient/family education;Cognitive remediation;Gait training;Stair training    PT Goals (Current goals can be found in the Care Plan section)  Acute Rehab PT Goals Patient Stated Goal: Unable to state PT Goal Formulation: Patient unable to participate in goal setting Time For Goal Achievement: 08/28/23 Potential to Achieve Goals: Fair    Frequency Min 1X/week     Co-evaluation               AM-PAC PT "6 Clicks" Mobility  Outcome Measure Help needed turning from your back to your side while in a flat bed without using bedrails?: Total Help needed moving from lying on your back to sitting on the side of a flat bed without using bedrails?: Total Help needed moving to and from a bed to a chair (including a wheelchair)?: Total Help needed standing up from a chair using your arms (e.g., wheelchair or bedside chair)?: Total Help needed to walk in hospital room?: Total Help needed climbing 3-5 steps with a railing? : Total 6 Click Score: 6    End of Session   Activity Tolerance: Patient limited by lethargy Patient left: in bed;with call bell/phone within reach;with bed alarm set Nurse Communication: Mobility status PT Visit Diagnosis: Muscle weakness (generalized) (M62.81);Other abnormalities of gait and mobility (R26.89);Difficulty in walking, not  elsewhere classified (R26.2)    Time: 0814-0829 PT Time Calculation (min) (ACUTE ONLY): 15 min   Charges:   PT Evaluation $PT Eval Moderate Complexity: 1 Mod   PT General Charges $$ ACUTE PT VISIT: 1 Visit         Cheri Guppy, PT, DPT Acute Rehabilitation Services Office: [phone] Secure Chat Preferred  Richardson Chiquito 08/14/2023, 9:31 AM

## 2023-08-14 NOTE — Procedures (Addendum)
 Patient Name: Steven Meadows  MRN: [id number]  Epilepsy Attending: Arleene Lack  Referring Physician/Provider: Augustin Leber, MD  Duration: 08/13/2023 1234 to 08/14/2023 1234  Patient history: a 89 y.o. male with a history of previous stroke, encephalitis at age 75, atrial fibrillation on Eliquis, recent decline who presented on 08/12/2023 with decreased responsiveness.  EEG to evaluate for seizure  Level of alertness: awake  AEDs during EEG study: LEV  Technical aspects: This EEG study was done with scalp electrodes positioned according to the 10-20 International system of electrode placement. Electrical activity was reviewed with band pass filter of 1-70Hz , sensitivity of 7 uV/mm, display speed of 90mm/sec with a 60Hz  notched filter applied as appropriate. EEG data were recorded continuously and digitally stored.  Video monitoring was available and reviewed as appropriate.  Description: EEG showed continuous generalized 3 to 6 Hz theta-delta slowing. Lateralized periodic discharges were noted in right hemisphere, maximal right posterior quadrant with fluctuating frequency of 0.5-1.5Hz  Hz.   Five seizures without clinical signs were noted arising from right hemisphere on 08/13/2023 at 1249, 1358, 1423, 1442 and 1525. During seizure, EEG showed rhythmic 5-6hz  theta slowing in right hemisphere which then became higher amplitude and evolved into 2-3hz  delta slowing admixed with spikes. Duration of seizure was about 2 minutes each.   Hyperventilation and photic stimulation were not performed.     ABNORMALITY - Seizures without clinical signs, right hemisphere  - Lateralized periodic discharges, right hemisphere, maximal right posterior quadrant - Continuous slow, generalized  IMPRESSION: This study showed five seizures without clinical signs were noted arising from right hemisphere on 08/13/2023 at 1249, 1358, 1423, 1442 and 1525 lasting about 2 minutes each.    Additionally there is  evidence of epileptogenicity arising from right hemisphere, maximal right posterior quadrant. This EEG pattern is on the ictal-interictal continuum with high risk of seizure recurrence.   Lastly there is moderate diffuse encephalopathy.   [name] O [place]

## 2023-08-14 NOTE — Progress Notes (Signed)
 OT Cancellation Note  Patient Details Name: Steven Meadows MRN: [id number] DOB: [birth year]   Cancelled Treatment: Pt undergoing EEG, RN asked OT to hold for the day. OT will continue to follow for evaluation  Emelda Fear 08/14/2023, 10:00 AM  Nyoka Cowden OTR/L Acute Rehabilitation Services Office: [phone]

## 2023-08-14 NOTE — Progress Notes (Signed)
 PROGRESS NOTE                                                                                                                                                                                                             Patient Demographics:    Steven Meadows, is a 89 y.o. male, DOB - Apr 19, 1932, ZOX:[id number]  Outpatient Primary MD for the patient is Pcp, No    LOS - 2  Admit date - 08/12/2023    No chief complaint on file.      Brief Narrative (HPI from H&P)   89 y.o. male with past medical history significant of hypertension, hyperlipidemia, atrial fibrillation, stroke, intracerebral hemorrhage, renal disease presented to hospital after found to be unresponsive at his assisted living facility.  He had been having progressive weakness recently and was found facedown on the floor.  Her workup in the ER was suggestive of possible acute and subacute stroke, ongoing seizures, suspicious for meningitis/encephalitis and he was admitted to the hospital, neurology was consulted.   Subjective:    Steven Meadows today in bed, nearly obtunded unable to provide any review of systems   Assessment  & Plan :   Acute metabolic encephalopathy with sepsis present on admission.   Recently moved to assisted living facility where he was exposed by a sick contact diagnosed with herpes, subsequently found unresponsive by staff at assisted living facility.  Workup in the hospital has so far revealed acute to subacute strokes on MRI, ongoing seizures on EEG suspicious for status epilepticus, leukocytosis, near obtunded state.  Upon his presentation meningitis cannot be ruled out, he has been seen by neurology, on broad-spectrum antibiotics, blood cultures so far suggestive of contamination with staph epidermis, for now continue broad-spectrum antibiotics and acyclovir, LP to be attempted by neurology on 08/14/2023.  For now continue supportive care with IV  fluids, antiepileptics, IV antibiotics, plan discussed with family in detail.   Sepsis  See above.  History of CVA  MRI noted, acute to subacute strokes, heparin drip post LP, echocardiogram, further management per neurology.   History of atrial fibrillation.  Has been started on heparin drip.  Was on Eliquis at home.  Currently on hold.  Might need lumbar puncture.  As needed Cardizem.   Hypertension as needed Cardizem.   Hyperlipidemia  - Zetia and Crestor on hold.  Condition - Extremely Guarded  Family Communication  : Updated daughter in detail on 08/14/2023  Code Status : DNR  Consults  : Neurology  PUD Prophylaxis : PPI   Procedures  :     MRI brain.   subtle diffusion signal abnormality in the right hippocampus with areas of restricted diffusion noted within the body and tail of the hippocampus. Additional subtle diffusion signal abnormality and restricted diffusion involving the right dorsal thalamus. Findings are concerning for acute to early subacute infarcts.   CTA head and neck.   CTA neck: 1. The common carotid and internal carotid arteries are patent within the neck without hemodynamically significant stenosis. Atherosclerotic plaque about the carotid bifurcations and within the proximal internal carotid arteries, bilaterally. 2. The left vertebral artery is non-dominant and developmentally diminutive, but patent throughout the neck. 3. The dominant right vertebral artery is patent within the neck. Non-stenotic atherosclerotic plaque at the origin of this vessel. 4. Aortic Atherosclerosis (ICD10-I70.0).   CTA head: 1. No proximal intracranial large vessel occlusion identified. 2. Intracranial atherosclerotic disease with multifocal stenoses, most notably as follows. 3. Severe stenosis within a left PCA branch at the P2/P3 junction, new from prior exams. 4. Sites of up to moderate stenosis within the intracranial right vertebral artery. 5. Up to moderate stenosis of  the supraclinoid right internal carotid artery. 6. 7-8 mm aneurysm at the vertebrobasilar junction on the right, unchanged in size from the CTA of 04/30/2020  EEG.  Positive for seizures.    Echocardiogram.    LP      Disposition Plan  :    Status is: Inpatient  DVT Prophylaxis  :  SCDs/Hep gtt  Lab Results  Component Value Date   PLT 208 08/13/2023    Diet :  Diet Order             Diet NPO time specified  Diet effective now                    Inpatient Medications  Scheduled Meds:  pantoprazole (PROTONIX) IV  40 mg Intravenous Q24H   sodium chloride flush  3 mL Intravenous Q12H   Continuous Infusions:  acyclovir 900 mg (08/13/23 2245)   ampicillin (OMNIPEN) IV 2 g (08/14/23 0247)   cefTRIAXone (ROCEPHIN)  IV 2 g (08/13/23 2241)   dextrose 5 % and 0.45 % NaCl     levETIRAcetam 1,000 mg (08/14/23 1610)   metronidazole 500 mg (08/13/23 2026)   potassium chloride     vancomycin 750 mg (08/14/23 0619)   PRN Meds:.acetaminophen **OR** acetaminophen, diltiazem, LORazepam, polyethylene glycol  Antibiotics  :    Anti-infectives (From admission, onward)    Start     Dose/Rate Route Frequency Ordered Stop   08/14/23 0600  vancomycin (VANCOREADY) IVPB 750 mg/150 mL        750 mg 150 mL/hr over 60 Minutes Intravenous Every 12 hours 08/13/23 1543     08/13/23 1200  vancomycin (VANCOCIN) IVPB 1000 mg/200 mL premix  Status:  Discontinued        1,000 mg 200 mL/hr over 60 Minutes Intravenous Every 24 hours 08/12/23 1405 08/13/23 1543   08/13/23 1000  acyclovir (ZOVIRAX) 900 mg in dextrose 5 % 250 mL IVPB        10 mg/kg  90 kg 268 mL/hr over 60 Minutes Intravenous Every 12 hours 08/13/23 0857     08/13/23 0000  ceFEPIme (MAXIPIME) 2 g in sodium chloride 0.9 % 100 mL  IVPB  Status:  Discontinued        2 g 200 mL/hr over 30 Minutes Intravenous Every 12 hours 08/12/23 1405 08/12/23 1929   08/12/23 2200  cefTRIAXone (ROCEPHIN) 2 g in sodium chloride 0.9 % 100 mL  IVPB        2 g 200 mL/hr over 30 Minutes Intravenous Every 12 hours 08/12/23 1929     08/12/23 2000  metroNIDAZOLE (FLAGYL) IVPB 500 mg        500 mg 100 mL/hr over 60 Minutes Intravenous Every 12 hours 08/12/23 1345     08/12/23 1945  ampicillin (OMNIPEN) 2 g in sodium chloride 0.9 % 100 mL IVPB        2 g 300 mL/hr over 20 Minutes Intravenous Every 6 hours 08/12/23 1929     08/12/23 1230  ceFEPIme (MAXIPIME) 2 g in sodium chloride 0.9 % 100 mL IVPB        2 g 200 mL/hr over 30 Minutes Intravenous  Once 08/12/23 1218 08/12/23 1307   08/12/23 1230  metroNIDAZOLE (FLAGYL) IVPB 500 mg        500 mg 100 mL/hr over 60 Minutes Intravenous  Once 08/12/23 1218 08/12/23 1335   08/12/23 1230  vancomycin (VANCOCIN) IVPB 1000 mg/200 mL premix  Status:  Discontinued        1,000 mg 200 mL/hr over 60 Minutes Intravenous  Once 08/12/23 1218 08/12/23 1219   08/12/23 1230  vancomycin (VANCOREADY) IVPB 2000 mg/400 mL        2,000 mg 200 mL/hr over 120 Minutes Intravenous  Once 08/12/23 1219 08/12/23 1516         Objective:   Vitals:   08/13/23 2328 08/14/23 0401 08/14/23 0402 08/14/23 0502  BP: 128/86  (!) 137/94   Pulse: 94 (!) 107 (!) 101 97  Resp: 19 16 20 20   Temp: 99.3 F (37.4 C)  99.1 F (37.3 C)   TempSrc: Axillary  Axillary   SpO2: 91% 97% 96% 95%  Weight:      Height:        Wt Readings from Last 3 Encounters:  08/13/23 90 kg  01/19/22 90.7 kg  06/10/21 98.9 kg     Intake/Output Summary (Last 24 hours) at 08/14/2023 0848 Last data filed at 08/14/2023 1610 Gross per 24 hour  Intake 0 ml  Output 350 ml  Net -350 ml     Physical Exam  Somnolent and thoroughly confused, no new F.N deficits,   [place].AT,PERRAL Supple Neck, No JVD,   Symmetrical Chest wall movement, Good air movement bilaterally, CTAB RRR,No Gallops,Rubs or new Murmurs,  +ve B.Sounds, Abd Soft, No tenderness,   No Cyanosis, Clubbing or edema      Data Review:    Recent Labs  Lab 08/12/23 1037  08/12/23 1044 08/13/23 0406  WBC 21.4*  --  15.1*  HGB 15.7 16.7 14.6  HCT 48.0 49.0 43.8  PLT 246  --  208  MCV 87.6  --  85.0  MCH 28.6  --  28.3  MCHC 32.7  --  33.3  RDW 13.5  --  13.8  LYMPHSABS 1.0  --   --   MONOABS 2.7*  --   --   EOSABS 0.0  --   --   BASOSABS 0.0  --   --     Recent Labs  Lab 08/12/23 1037 08/12/23 1044 08/12/23 1200 08/12/23 1412 08/13/23 0406 08/14/23 0541 08/14/23 0609  NA 133* 132*  --   --  134* 134*  --   K 4.9 5.0  --   --  3.7 3.2*  --   CL 97* 100  --   --  99 100  --   CO2 18*  --   --   --  22 23  --   ANIONGAP 18*  --   --   --  13 11  --   GLUCOSE 169* 171*  --   --  150* 112*  --   BUN 15 17  --   --  14 16  --   CREATININE 1.50* 1.50*  --   --  1.18 1.08  --   AST 49*  --   --   --  266*  --   --   ALT 31  --   --   --  61*  --   --   ALKPHOS 31*  --   --   --  24*  --   --   BILITOT 0.5  --   --   --  1.0  --   --   ALBUMIN 4.2  --   --   --  3.2*  --   --   PROCALCITON  --   --   --   --   --   --  0.28  LATICACIDVEN  --   --  3.5* 3.8*  --   --   --   INR 1.3*  --   --   --   --   --   --   MG  --   --   --   --   --  1.8  --   CALCIUM 9.1  --   --   --  8.6* 8.3*  --       Recent Labs  Lab 08/12/23 1037 08/12/23 1200 08/12/23 1412 08/13/23 0406 08/14/23 0541 08/14/23 0609  PROCALCITON  --   --   --   --   --  0.28  LATICACIDVEN  --  3.5* 3.8*  --   --   --   INR 1.3*  --   --   --   --   --   MG  --   --   --   --  1.8  --   CALCIUM 9.1  --   --  8.6* 8.3*  --     --------------------------------------------------------------------------------------------------------------- Lab Results  Component Value Date   CHOL 132 12/08/2020   HDL 26 (L) 12/08/2020   LDLCALC 98 12/08/2020   TRIG 39 12/08/2020   CHOLHDL 5.1 12/08/2020    Lab Results  Component Value Date   HGBA1C 6.0 (H) 12/08/2020      Micro Results Recent Results (from the past 240 hours)  Culture, blood (routine x 2)     Status: None  (Preliminary result)   Collection Time: 08/12/23 11:22 AM   Specimen: BLOOD LEFT HAND  Result Value Ref Range Status   Specimen Description BLOOD LEFT HAND  Final   Special Requests   Final    BOTTLES DRAWN AEROBIC AND ANAEROBIC Blood Culture results may not be optimal due to an inadequate volume of blood received in culture bottles   Culture   Final    NO GROWTH 2 DAYS Performed at [facility], [address] [place], [place] [address]    Report Status PENDING  Incomplete  Resp panel by RT-PCR (RSV, Flu A&B, Covid) Anterior Nasal Swab  Status: None   Collection Time: 08/12/23 11:26 AM   Specimen: Anterior Nasal Swab  Result Value Ref Range Status   SARS Coronavirus 2 by RT PCR NEGATIVE NEGATIVE Final   Influenza A by PCR NEGATIVE NEGATIVE Final   Influenza B by PCR NEGATIVE NEGATIVE Final    Comment: (NOTE) The Xpert Xpress SARS-CoV-2/FLU/RSV plus assay is intended as an aid in the diagnosis of influenza from Nasopharyngeal swab specimens and should not be used as a sole basis for treatment. Nasal washings and aspirates are unacceptable for Xpert Xpress SARS-CoV-2/FLU/RSV testing.  Fact Sheet for Patients: BloggerCourse.com  Fact Sheet for Healthcare Providers: SeriousBroker.it  This test is not yet approved or cleared by the [place] FDA and has been authorized for detection and/or diagnosis of SARS-CoV-2 by FDA under an Emergency Use Authorization (EUA). This EUA will remain in effect (meaning this test can be used) for the duration of the COVID-19 declaration under Section 564(b)(1) of the Act, 21 U.S.C. section 360bbb-3(b)(1), unless the authorization is terminated or revoked.     Resp Syncytial Virus by PCR NEGATIVE NEGATIVE Final    Comment: (NOTE) Fact Sheet for Patients: BloggerCourse.com  Fact Sheet for Healthcare  Providers: SeriousBroker.it  This test is not yet approved or cleared by the [place] FDA and has been authorized for detection and/or diagnosis of SARS-CoV-2 by FDA under an Emergency Use Authorization (EUA). This EUA will remain in effect (meaning this test can be used) for the duration of the COVID-19 declaration under Section 564(b)(1) of the Act, 21 U.S.C. section 360bbb-3(b)(1), unless the authorization is terminated or revoked.  Performed at [facility], [address] [place], [place] [address]   Culture, blood (routine x 2)     Status: None (Preliminary result)   Collection Time: 08/12/23 11:55 AM   Specimen: BLOOD LEFT ARM  Result Value Ref Range Status   Specimen Description BLOOD LEFT ARM  Final   Special Requests   Final    BOTTLES DRAWN AEROBIC AND ANAEROBIC Blood Culture adequate volume   Culture  Setup Time   Final    GRAM POSITIVE COCCI ANAEROBIC BOTTLE ONLY CRITICAL RESULT CALLED TO, READ BACK BY AND VERIFIED WITH: PHARMD Francie Massing on [phone]  by SM Performed at [facility], [address], [place], [place] [address]    Culture GRAM POSITIVE COCCI  Final   Report Status PENDING  Incomplete  Blood Culture ID Panel (Reflexed)     Status: Abnormal   Collection Time: 08/12/23 11:55 AM  Result Value Ref Range Status   Enterococcus faecalis NOT DETECTED NOT DETECTED Final   Enterococcus Faecium NOT DETECTED NOT DETECTED Final   Listeria monocytogenes NOT DETECTED NOT DETECTED Final   Staphylococcus species DETECTED (A) NOT DETECTED Final    Comment: CRITICAL RESULT CALLED TO, READ BACK BY AND VERIFIED WITH: PHARMD Bailey A on [phone] @0755  by SM    Staphylococcus aureus (BCID) NOT DETECTED NOT DETECTED Final   Staphylococcus epidermidis DETECTED (A) NOT DETECTED Final    Comment: Methicillin (oxacillin) resistant coagulase negative staphylococcus. Possible blood culture contaminant (unless isolated from more than one  blood culture draw or clinical case suggests pathogenicity). No antibiotic treatment is indicated for blood  culture contaminants. CRITICAL RESULT CALLED TO, READ BACK BY AND VERIFIED WITH: PHARMD Francie Massing on [phone] @[phone]  by SM    Staphylococcus lugdunensis NOT DETECTED NOT DETECTED Final   Streptococcus species NOT DETECTED NOT DETECTED Final   Streptococcus agalactiae  NOT DETECTED NOT DETECTED Final   Streptococcus pneumoniae NOT DETECTED NOT DETECTED Final   Streptococcus pyogenes NOT DETECTED NOT DETECTED Final   A.calcoaceticus-baumannii NOT DETECTED NOT DETECTED Final   Bacteroides fragilis NOT DETECTED NOT DETECTED Final   Enterobacterales NOT DETECTED NOT DETECTED Final   Enterobacter cloacae complex NOT DETECTED NOT DETECTED Final   Escherichia coli NOT DETECTED NOT DETECTED Final   Klebsiella aerogenes NOT DETECTED NOT DETECTED Final   Klebsiella oxytoca NOT DETECTED NOT DETECTED Final   Klebsiella pneumoniae NOT DETECTED NOT DETECTED Final   Proteus species NOT DETECTED NOT DETECTED Final   Salmonella species NOT DETECTED NOT DETECTED Final   Serratia marcescens NOT DETECTED NOT DETECTED Final   Haemophilus influenzae NOT DETECTED NOT DETECTED Final   Neisseria meningitidis NOT DETECTED NOT DETECTED Final   Pseudomonas aeruginosa NOT DETECTED NOT DETECTED Final   Stenotrophomonas maltophilia NOT DETECTED NOT DETECTED Final   Candida albicans NOT DETECTED NOT DETECTED Final   Candida auris NOT DETECTED NOT DETECTED Final   Candida glabrata NOT DETECTED NOT DETECTED Final   Candida krusei NOT DETECTED NOT DETECTED Final   Candida parapsilosis NOT DETECTED NOT DETECTED Final   Candida tropicalis NOT DETECTED NOT DETECTED Final   Cryptococcus neoformans/gattii NOT DETECTED NOT DETECTED Final   Methicillin resistance mecA/C DETECTED (A) NOT DETECTED Final    Comment: CRITICAL RESULT CALLED TO, READ BACK BY AND VERIFIED WITH: PHARMD Francie Massing on [phone] @0755  by SM Performed at  [facility], [address], [place], [place] [address]     Radiology Report DG Chest [place] 1 View Result Date: 08/14/2023 CLINICAL DATA:  Shortness of breath EXAM: PORTABLE CHEST 1 VIEW COMPARISON:  Two days prior FINDINGS: Generous heart size and stable mediastinal contours. Mild hazy density at the bases favoring atelectasis. There is no edema, consolidation, effusion, or pneumothorax. IMPRESSION: Mild hazy density at the bases favoring atelectasis. Electronically Signed   By: Tiburcio Pea M.D.   On: 08/14/2023 06:29   Overnight EEG with video Result Date: 08/14/2023 Charlsie Quest, MD     08/14/2023  5:34 AM Patient Name: Steven Meadows MRN: [id number] Epilepsy Attending: Charlsie Quest Referring Physician/Provider: Rejeana Brock, MD Duration: 08/13/2023 1234 to 08/14/2023 0530 Patient history: a 89 y.o. male with a history of previous stroke, encephalitis at age 83, atrial fibrillation on Eliquis, recent decline who presented on 08/12/2023 with decreased responsiveness.  EEG to evaluate for seizure Level of alertness: awake AEDs during EEG study: LEV Technical aspects: This EEG study was done with scalp electrodes positioned according to the 10-20 International system of electrode placement. Electrical activity was reviewed with band pass filter of 1-70Hz , sensitivity of 7 uV/mm, display speed of 87mm/sec with a 60Hz  notched filter applied as appropriate. EEG data were recorded continuously and digitally stored.  Video monitoring was available and reviewed as appropriate. Description: EEG showed continuous generalized 3 to 6 Hz theta-delta slowing. Lateralized periodic discharges were noted in right hemisphere, maximal right posterior quadrant with fluctuating frequency of 0.5-1.5Hz  Hz. Five seizures without clinical signs were noted arising from right hemisphere on 08/13/2023 at 1249, 1358, 1423, 1442 and 1525. During seizure, EEG showed rhythmic 5-6hz  theta slowing in right hemisphere  which then became higher amplitude and evolved into 2-3hz  delta slowing admixed with spikes. Duration of seizure was about 2 minutes each. Hyperventilation and photic stimulation were not performed.   ABNORMALITY - Seizures without clinical signs, right hemisphere - Lateralized periodic discharges, right  hemisphere, maximal right posterior quadrant - Continuous slow, generalized IMPRESSION: This study showed five seizures without clinical signs were noted arising from right hemisphere on 08/13/2023 at 1249, 1358, 1423, 1442 and 1525 lasting about 2 minutes each.  Additionally there is evidence of epileptogenicity arising from right hemisphere, maximal right posterior quadrant. This EEG pattern is on the ictal-interictal continuum with high risk of seizure recurrence.  Lastly there is moderate diffuse encephalopathy. Charlsie Quest   MR BRAIN WO CONTRAST Addendum Date: 08/13/2023 ADDENDUM REPORT: 08/13/2023 11:45 ADDENDUM: On further review of the images there is subtle diffusion signal abnormality in the right hippocampus with areas of restricted diffusion noted within the body and tail of the hippocampus. Additional subtle diffusion signal abnormality and restricted diffusion involving the right dorsal thalamus. Findings are concerning for acute to early subacute infarcts. Additional subtle diffusion signal abnormality in the right temporal lobe without definite restricted diffusion which may be artifactual versus additional region of infarct. Findings were discussed with Dr. Amada Jupiter by Dr. Renette Butters on 08/13/2023. Electronically Signed   By: Emily Filbert M.D.   On: 08/13/2023 11:45   Result Date: 08/13/2023 CLINICAL DATA:  Mental status change. EXAM: MRI HEAD WITHOUT CONTRAST TECHNIQUE: Multiplanar, multiecho pulse sequences of the brain and surrounding structures were obtained without intravenous contrast. COMPARISON:  CT head earlier same day.  MRI head 12/07/2020. FINDINGS: Brain: No acute infarct.  Scattered and confluent FLAIR signal abnormality in the periventricular and subcortical white matter suggestive of chronic microvascular ischemic changes. Multiple small remote infarcts in the bilateral corona radiata and periventricular white matter. Additional remote infarcts in the central pons. Additional remote lacunar infarcts in the bilateral basal ganglia and bilateral thalami. Chronic microhemorrhages in the thalami, left greater than right, and the central pons. Marked parenchymal volume loss with slight temporal lobe predominance. No mass lesion or midline shift. Normal appearance of midline structures. The basilar cisterns are patent. No extra-axial fluid collections. Ventricles: Prominence of the lateral ventricles suggestive of underlying parenchymal volume loss. Vascular: Skull base flow voids are visualized. Skull and upper cervical spine: No focal abnormality. Sinuses/Orbits: Orbits are symmetric. Bilateral lens replacement. Mucosal thickening in the frontal sinuses. Other: Small right mastoid effusion. IMPRESSION: No acute intracranial abnormality. Extensive chronic microvascular ischemic changes and parenchymal volume loss. Multiple remote infarcts as above. Chronic microhemorrhages in the thalami and pons which may be related to hypertension. Electronically Signed: By: Emily Filbert M.D. On: 08/12/2023 18:50   CT ANGIO HEAD NECK W WO CM Result Date: 08/13/2023 CLINICAL DATA:  Provided history: Stroke, follow-up. EXAM: CT ANGIOGRAPHY HEAD AND NECK WITH AND WITHOUT CONTRAST TECHNIQUE: Multidetector CT imaging of the head and neck was performed using the standard protocol during bolus administration of intravenous contrast. Multiplanar CT image reconstructions and MIPs were obtained to evaluate the vascular anatomy. Carotid stenosis measurements (when applicable) are obtained utilizing NASCET criteria, using the distal internal carotid diameter as the denominator. RADIATION DOSE REDUCTION: This  exam was performed according to the departmental dose-optimization program which includes automated exposure control, adjustment of the mA and/or kV according to patient size and/or use of iterative reconstruction technique. CONTRAST:  75mL OMNIPAQUE IOHEXOL 350 MG/ML SOLN COMPARISON:  Brain MRI 08/12/2023. CT angiogram head/neck 04/30/2020. MRA head 12/07/2020. Obstructive plaque FINDINGS: CT HEAD FINDINGS Motion degraded exam. Brain: Generalized cerebral atrophy. Chronic lacunar infarcts within the bilateral cerebral hemispheric white matter, within/about the bilateral deep gray nuclei and within the pons, some of which were better appreciated on the same-day brain  MRI. Advanced patchy and confluent hypoattenuation elsewhere in the cerebral white matter, nonspecific but compatible with chronic small vessel ischemic disease. Diffusion-weighted signal abnormality noted within the anteromedial right temporal lobe, right hippocampus and dorsomedial right thalamus on same-day brain MRI. There is no acute intracranial hemorrhage. No extra-axial fluid collection. No evidence of an intracranial mass. No midline shift. Vascular: No hyperdense vessel. Atherosclerotic calcifications. Skull: No calvarial fracture or aggressive osseous lesion. Sinuses/Orbits: No orbital mass or acute orbital finding. Small mucous retention cysts within the bilateral frontal sinuses. Minimal mucosal thickening scattered within the ethmoid air cells. Mild mucosal thickening within the maxillary sinuses. Other: Trace fluid within right mastoid air cells. Review of the MIP images confirms the above findings CTA NECK FINDINGS Aortic arch: Common origin of the innominate and left common carotid arteries. The visualized thoracic aorta is normal in caliber. Aspect plaque within the visualized thoracic aorta and proximal major branch vessels of the neck. No hemodynamically significant innominate or proximal subclavian artery stenosis. Right carotid  system: CCA and ICA patent within the neck. Atherosclerotic plaque about the carotid bifurcation and within the proximal ICA without hemodynamically significant stenosis (50% or greater). Tortuosity of the mid cervical ICA. Left carotid system: CCA and ICA patent within the neck. Aspect plaque about the carotid bifurcation S5 plaque both carotid bifurcation and within the proximal ICA without hemodynamically significant stenosis (50% or greater). Mild nonstenotic calcified plaque also present within the distal cervical ICA. Vertebral arteries: The left vertebral artery is non-dominant and developmentally diminutive, but patent throughout the neck. Calcified aspect plaque at the origin of this vessel. The dominant right vertebral artery is patent within the neck. Nonstenotic calcified plaque at the origin of this vessel. Skeleton: Spondylosis at the cervical and visualized upper thoracic levels. C2-C3 vertebral ankylosis. Mild chronic T5 superior endplate compression deformity. Other neck: No neck mass or cervical lymphadenopathy. Upper chest: No consolidation within the imaged lung apices. Review of the MIP images confirms the above findings CTA HEAD FINDINGS Anterior circulation: The intracranial internal carotid arteries are patent. Calcified atherosclerotic plaque within both vessels. Up to moderate stenosis of the supraclinoid right ICA. No more than mild stenosis elsewhere within the intracranial internal carotid arteries. The M1 middle cerebral arteries are patent. No M2 proximal branch occlusion or high-grade proximal stenosis. The anterior cerebral arteries are patent. Somewhat hypoplastic right A1 segment. Posterior circulation: The intracranial vertebral arteries are patent. The non-dominant left vertebral artery terminates predominantly (or entirely) as the left PICA. Atherosclerotic plaque within the intracranial right vertebral artery with sites of up to moderate stenosis. 7-8 mm aneurysm at the  vertebrobasilar junction on the right, unchanged in size as compared to the CTA of 04/30/2020 (remeasured on prior). The basilar artery is patent. Atherosclerotic plaque within the proximal basilar artery with no more than mild stenosis. The posterior cerebral arteries are patent. Atherosclerotic irregularity of both vessels. Most notably, there is a severe stenosis within a left PCA branch at the P2/P3 junction, which is new from prior exams (series 16, image 26). Hypoplastic right P1 segment with sizable right posterior communicating artery. The left posterior cerebral artery is fetal in origin. Venous sinuses: Limited assessment for dural venous sinus thrombosis due to contrast timing. Anatomic variants: As described. Review of the MIP images confirms the above findings Non-contrast head CT impression #2 These results were called by telephone at the time of interpretation on 08/13/2023 at 9:30 am to provider [facility] , who verbally acknowledged these results. IMPRESSION: Non-contrast head  CT: 1. Motion degraded exam. 2. No CT evidence of an acute intracranial abnormality. However, diffusion-weighted signal abnormality is noted within the anteromedial right temporal lobe, right hippocampus and dorsomedial right thalamus on the same-day brain MRI. 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 Acute infarct considered less likely but not excluded. 3. Background parenchymal atrophy, chronic small vessel ischemic disease and chronic lacunar infarcts, as described. 4. Mild paranasal sinus disease. CTA neck: 1. The common carotid and internal carotid arteries are patent within the neck without hemodynamically significant stenosis. Atherosclerotic plaque about the carotid bifurcations and within the proximal internal carotid arteries, bilaterally. 2. The left vertebral artery is non-dominant and developmentally diminutive, but patent  throughout the neck. 3. The dominant right vertebral artery is patent within the neck. Non-stenotic atherosclerotic plaque at the origin of this vessel. 4. Aortic Atherosclerosis (ICD10-I70.0). CTA head: 1. No proximal intracranial large vessel occlusion identified. 2. Intracranial atherosclerotic disease with multifocal stenoses, most notably as follows. 3. Severe stenosis within a left PCA branch at the P2/P3 junction, new from prior exams. 4. Sites of up to moderate stenosis within the intracranial right vertebral artery. 5. Up to moderate stenosis of the supraclinoid right internal carotid artery. 6. 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 Electronically Signed   By: Jackey Loge D.O.   On: 08/13/2023 10:11   DG Shoulder Left Port Result Date: 08/12/2023 CLINICAL DATA:  Fall bruising EXAM: LEFT SHOULDER COMPARISON:  None Available. FINDINGS: No fracture or malalignment. Moderate AC joint degenerative change. Mild glenohumeral degenerative change. No fracture or malalignment. Possible remote Hill-Sachs deformity of the superolateral humeral head. IMPRESSION: Degenerative changes.  No definite acute osseous abnormality Electronically Signed   By: Jasmine Pang M.D.   On: 08/12/2023 20:05   EEG adult Result Date: 08/12/2023 Charlsie Quest, MD     08/12/2023  3:21 PM Patient Name: Steven Meadows MRN: [id number] Epilepsy Attending: Charlsie Quest Referring Physician/Provider: Synetta Fail, MD Date: 08/12/2023 Duration: 24.22 mins Patient history: 89yo M with ams. EEG to evaluate for seizure Level of alertness: Awake/ lethargic AEDs during EEG study: None Technical aspects: This EEG study was done with scalp electrodes positioned according to the 10-20 International system of electrode placement. Electrical activity was reviewed with band pass filter of 1-70Hz , sensitivity of 7 uV/mm, display speed of 73mm/sec with a  60Hz  notched filter applied as appropriate. EEG data were recorded continuously and digitally stored.  Video monitoring was available and reviewed as appropriate. Description: EEG showed continuous generalized 3 to 6 Hz theta-delta slowing. Hyperventilation and photic stimulation were not performed.   ABNORMALITY - Continuous slow, generalized IMPRESSION: This study is suggestive of moderate diffuse encephalopathy. No seizures or epileptiform discharges were seen throughout the recording. Charlsie Quest   DG Chest Portable 1 View Result Date: 08/12/2023 CLINICAL DATA:  Trauma. EXAM: PORTABLE CHEST 1 VIEW COMPARISON:  April 30, 2020. FINDINGS: Stable cardiomediastinal silhouette. Both lungs are clear. The visualized skeletal structures are unremarkable. IMPRESSION: No active disease. Electronically Signed   By: Lupita Raider M.D.   On: 08/12/2023 12:44   DG Pelvis Portable Result Date: 08/12/2023 CLINICAL DATA:  Trauma. EXAM: PORTABLE PELVIS 1-2 VIEWS COMPARISON:  None Available. FINDINGS: No acute fracture or dislocation. The bones are osteopenic. Mild bilateral hip arthritic changes. The soft tissues are unremarkable. IMPRESSION: 1. No acute fracture or  dislocation. 2. Osteopenia. Electronically Signed   By: Elgie Collard M.D.   On: 08/12/2023 12:42   CT Head Wo Contrast Result Date: 08/12/2023 CLINICAL DATA:  Ataxia, head trauma; Ataxia, cervical trauma EXAM: CT HEAD WITHOUT CONTRAST CT CERVICAL SPINE WITHOUT CONTRAST TECHNIQUE: Multidetector CT imaging of the head and cervical spine was performed following the standard protocol without intravenous contrast. Multiplanar CT image reconstructions of the cervical spine were also generated. RADIATION DOSE REDUCTION: This exam was performed according to the departmental dose-optimization program which includes automated exposure control, adjustment of the mA and/or kV according to patient size and/or use of iterative reconstruction technique.  COMPARISON:  MRI head from 12/07/2020, CT scan head from 12/07/2020 and CT angiography neck from 04/30/2020. FINDINGS: CT HEAD FINDINGS Brain: No evidence of acute infarction, hemorrhage, hydrocephalus, extra-axial collection or mass lesion/mass effect. There is bilateral periventricular hypodensity, which is non-specific but most likely seen in the settings of microvascular ischemic changes. Moderate in extent. Redemonstration of chronic infarct in the midline/right paramedian pons, similar to the prior study. Otherwise normal appearance of brain parenchyma. Cerebral volume loss with enlargement of the ventricles. Vascular: No hyperdense vessel or unexpected calcification. Intracranial arteriosclerosis. Skull: Normal. Negative for fracture or focal lesion. Sinuses/Orbits: No acute finding. Other: Visualized mastoid air cells are unremarkable. No mastoid effusion. CT CERVICAL SPINE FINDINGS Alignment: There is grade 1 anterolisthesis of C5 over C6, most likely degenerative. This examination does not assess for ligamentous injury or stability. Skull base and vertebrae: No acute fracture. No primary bone lesion or focal pathologic process. Soft tissues and spinal canal: No prevertebral fluid or swelling. No visible canal hematoma. Disc levels: Moderate multilevel degenerative changes characterized by reduced intervertebral disc height (most pronounced at C6-7 level), along with moderate multilevel facet arthropathy and mild marginal osteophyte formation. Upper chest: Negative. Others: There is a 9 x 10 mm hypoattenuating nodule in the right thyroid lobe, incompletely characterized on the current exam. However, nodule does not meet the size criteria for follow-up ultrasound evaluation. IMPRESSION: *No acute intracranial abnormality. *No acute osseous injury of the cervical spine. Electronically Signed   By: Jules Schick M.D.   On: 08/12/2023 11:12   CT Cervical Spine Wo Contrast Result Date: 08/12/2023 CLINICAL  DATA:  Ataxia, head trauma; Ataxia, cervical trauma EXAM: CT HEAD WITHOUT CONTRAST CT CERVICAL SPINE WITHOUT CONTRAST TECHNIQUE: Multidetector CT imaging of the head and cervical spine was performed following the standard protocol without intravenous contrast. Multiplanar CT image reconstructions of the cervical spine were also generated. RADIATION DOSE REDUCTION: This exam was performed according to the departmental dose-optimization program which includes automated exposure control, adjustment of the mA and/or kV according to patient size and/or use of iterative reconstruction technique. COMPARISON:  MRI head from 12/07/2020, CT scan head from 12/07/2020 and CT angiography neck from 04/30/2020. FINDINGS: CT HEAD FINDINGS Brain: No evidence of acute infarction, hemorrhage, hydrocephalus, extra-axial collection or mass lesion/mass effect. There is bilateral periventricular hypodensity, which is non-specific but most likely seen in the settings of microvascular ischemic changes. Moderate in extent. Redemonstration of chronic infarct in the midline/right paramedian pons, similar to the prior study. Otherwise normal appearance of brain parenchyma. Cerebral volume loss with enlargement of the ventricles. Vascular: No hyperdense vessel or unexpected calcification. Intracranial arteriosclerosis. Skull: Normal. Negative for fracture or focal lesion. Sinuses/Orbits: No acute finding. Other: Visualized mastoid air cells are unremarkable. No mastoid effusion. CT CERVICAL SPINE FINDINGS Alignment: There is grade 1 anterolisthesis of C5 over C6, most likely degenerative.  This examination does not assess for ligamentous injury or stability. Skull base and vertebrae: No acute fracture. No primary bone lesion or focal pathologic process. Soft tissues and spinal canal: No prevertebral fluid or swelling. No visible canal hematoma. Disc levels: Moderate multilevel degenerative changes characterized by reduced intervertebral disc  height (most pronounced at C6-7 level), along with moderate multilevel facet arthropathy and mild marginal osteophyte formation. Upper chest: Negative. Others: There is a 9 x 10 mm hypoattenuating nodule in the right thyroid lobe, incompletely characterized on the current exam. However, nodule does not meet the size criteria for follow-up ultrasound evaluation. IMPRESSION: *No acute intracranial abnormality. *No acute osseous injury of the cervical spine. Electronically Signed   By: Jules Schick M.D.   On: 08/12/2023 11:12     Signature  -   Susa Raring M.D on 08/14/2023 at 8:48 AM   -  To page go to [email or web address]

## 2023-08-14 NOTE — Plan of Care (Signed)
  Problem: Education: Goal: Knowledge of General Education information will improve Description: Including pain rating scale, medication(s)/side effects and non-pharmacologic comfort measures Outcome: Progressing   Problem: Health Behavior/Discharge Planning: Goal: Ability to manage health-related needs will improve Outcome: Progressing   Problem: Clinical Measurements: Goal: Will remain free from infection Outcome: Progressing Goal: Diagnostic test results will improve Outcome: Progressing   Problem: Activity: Goal: Risk for activity intolerance will decrease Outcome: Progressing   Problem: Nutrition: Goal: Adequate nutrition will be maintained Outcome: Progressing   Problem: Safety: Goal: Ability to remain free from injury will improve Outcome: Progressing

## 2023-08-14 NOTE — NC FL2 (Signed)
 [place] MEDICAID FL2 LEVEL OF CARE FORM     IDENTIFICATION  Patient Name: Steven Meadows Birthdate: 1931/08/02 Sex: male Admission Date (Current Location): 08/12/2023  [facility] and [place] Number:  Producer, television/film/video and Address:  [facility], [address], [place], [place] [address]      Provider Number: [id number]  Attending Physician Name and Address:  Leroy Sea, MD  Relative Name and Phone Number:  Gifford Shave (son) [phone], Lanora Manis (spouse) [phone]    Current Level of Care: Hospital Recommended Level of Care: Skilled Nursing Facility Prior Approval Number:    Date Approved/Denied:   PASRR Number:    Discharge Plan: SNF    Current Diagnoses: Patient Active Problem List   Diagnosis Date Noted   Acute encephalopathy 08/12/2023   Palliative care encounter 08/07/2021   Hyponatremia    Hypokalemia    Slow transit constipation    ICH (intracerebral hemorrhage) (HCC) 04/30/2020   Renal insufficiency 08/18/2019   Chronic anticoagulation 01/23/2015   HLD (hyperlipidemia) 01/23/2015   Essential hypertension 01/23/2015   Brain aneurysm 01/23/2015   History of CVA (cerebrovascular accident) 12/21/2014   Chronic atrial fibrillation (HCC) 12/21/2014   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   Cough 06/17/2011    Orientation RESPIRATION BLADDER Height & Weight     Self, Place  O2 (Nasal Cannula 6 liters) Incontinent, External catheter (External Urinary Catheter) Weight: 198 lb 6.6 oz (90 kg) Height:  5\' 11"  (180.3 cm)  BEHAVIORAL SYMPTOMS/MOOD NEUROLOGICAL BOWEL NUTRITION STATUS      Continent Diet (Please see discharge summary)  AMBULATORY STATUS COMMUNICATION OF NEEDS Skin   Extensive Assist Verbally Other (Comment) (Abrasion,head,R,Ecchymosis,hand,L,Wound/Incision LDAs,Wound/Incision open or dehisced,face,upper,abrasion,bruising)                       Personal Care Assistance Level of Assistance  Dressing,  Bathing Bathing Assistance: Maximum assistance   Dressing Assistance: Maximum assistance     Functional Limitations Info  Sight, Hearing, Speech Sight Info: Adequate Hearing Info: Adequate Speech Info: Adequate    SPECIAL CARE FACTORS FREQUENCY  PT (By licensed PT), OT (By licensed OT)     PT Frequency: 5x min weekly OT Frequency: 5x min weekly            Contractures Contractures Info: Not present    Additional Factors Info  Code Status, Allergies Code Status Info: DNR Allergies Info: Nabumetone,Ace Inhibitors,Flonase (fluticasone Propionate)           Current Medications (08/14/2023):  This is the current hospital active medication list Current Facility-Administered Medications  Medication Dose Route Frequency Provider Last Rate Last Admin   acetaminophen (TYLENOL) tablet 650 mg  650 mg Oral Q6H PRN Synetta Fail, MD   650 mg at 08/13/23 0404   Or   acetaminophen (TYLENOL) suppository 650 mg  650 mg Rectal Q6H PRN Synetta Fail, MD   650 mg at 08/13/23 0836   acyclovir (ZOVIRAX) 900 mg in dextrose 5 % 250 mL IVPB  10 mg/kg Intravenous Q12H Pokhrel, Laxman, MD 268 mL/hr at 08/14/23 1213 900 mg at 08/14/23 1213   ampicillin (OMNIPEN) 2 g in sodium chloride 0.9 % 100 mL IVPB  2 g Intravenous Q6H Synetta Fail, MD 300 mL/hr at 08/14/23 1602 2 g at 08/14/23 1602   cefTRIAXone (ROCEPHIN) 2 g in sodium chloride 0.9 % 100 mL IVPB  2 g Intravenous Q12H Synetta Fail, MD 200 mL/hr at 08/14/23 1118  2 g at 08/14/23 1118   dextrose 5 % and 0.45 % NaCl infusion   Intravenous Continuous Leroy Sea, MD 75 mL/hr at 08/14/23 0940 New Bag at 08/14/23 0940   diltiazem (CARDIZEM) injection 10 mg  10 mg Intravenous Q6H PRN Leroy Sea, MD       lacosamide (VIMPAT) 100 mg in sodium chloride 0.9 % 25 mL IVPB  100 mg Intravenous Q12H Rejeana Brock, MD 70 mL/hr at 08/14/23 1437 100 mg at 08/14/23 1437   levETIRAcetam (KEPPRA) IVPB 1000 mg/100 mL  premix  1,000 mg Intravenous Q12H Rejeana Brock, MD 400 mL/hr at 08/14/23 0623 1,000 mg at 08/14/23 0981   LORazepam (ATIVAN) injection 2 mg  2 mg Intravenous Once PRN Synetta Fail, MD       metroNIDAZOLE (FLAGYL) IVPB 500 mg  500 mg Intravenous Q12H Synetta Fail, MD 100 mL/hr at 08/14/23 1009 500 mg at 08/14/23 1009   pantoprazole (PROTONIX) injection 40 mg  40 mg Intravenous Q24H Leroy Sea, MD   40 mg at 08/14/23 1055   polyethylene glycol (MIRALAX / GLYCOLAX) packet 17 g  17 g Oral Daily PRN Synetta Fail, MD       sodium chloride flush (NS) 0.9 % injection 3 mL  3 mL Intravenous Q12H Synetta Fail, MD   3 mL at 08/14/23 1123   vancomycin (VANCOREADY) IVPB 750 mg/150 mL  750 mg Intravenous Q12H Tamera Reason, RPH 150 mL/hr at 08/14/23 1914 750 mg at 08/14/23 7829     Discharge Medications: Please see discharge summary for a list of discharge medications.  Relevant Imaging Results:  Relevant Lab Results:   Additional Information [id number]  Delilah Shan, LCSWA

## 2023-08-14 NOTE — Progress Notes (Signed)
 LTM maint complete - no skin breakdown under: CZ,F7

## 2023-08-14 NOTE — Progress Notes (Signed)
  Echocardiogram 2D Echocardiogram has been performed.  Leda Roys RDCS 08/14/2023, 3:36 PM

## 2023-08-14 NOTE — Procedures (Signed)
 LUMBAR PUNCTURE (SPINAL TAP) PROCEDURE NOTE  Indication: AMS   Proceduralists: Gevena Mart, NP/Dr. Amada Jupiter    Risks, benefits and alternatives of the procedure were dicussed with the patient including but not limited to post-LP headache, bleeding, infection, weakness/numbness of legs(radiculopathy), death.    Consent obtained from: patients son Steven Meadows   Procedure Note The patient was prepped and draped, and using sterile technique a 20 gauge quinke spinal needle was inserted in the L4-5 space x 2 and was successful Opening pressure was 20 cm H2O. CSF was clear  Approximately 14 cc of CSF were obtained and sent for analysis.  Patient tolerated the procedure well and blood loss was minimal.    Gevena Mart DNP, ACNPC-AG  [facility]

## 2023-08-14 NOTE — TOC Initial Note (Signed)
 Transition of Care [facility]) - Initial/Assessment Note    Patient Details  Name: Steven Meadows MRN: [id number] Date of Birth: 1931-08-29  [facility] CM/SW Contact:    Delilah Shan, LCSWA Phone Number: 08/14/2023, 3:51 PM  Clinical Narrative:                  CSW received consult for possible SNF placement at time of discharge. Due to patients current orientation CSW spoke with patients spouse and patients son Beau regarding PT recommendation of SNF placement at time of discharge. PTA patient comes from [facility]. Patients spouse and patients son expressed understanding of PT recommendation and agreeable for patient to receive short term rehab at [facility] when medically ready for dc. CSW discussed insurance authorization process.No further questions reported at this time. CSW awaiting call back from [place] with [facility]. CSW to continue to follow and assist with discharge planning needs.   Expected Discharge Plan: Skilled Nursing Facility Barriers to Discharge: Continued Medical Work up   Patient Goals and CMS Choice     Choice offered to / list presented to : Spouse, Adult Children (patient and patients son)      Expected Discharge Plan and Services In-house Referral: Clinical Social Work     Living arrangements for the past 2 months: Assisted Living Facility                                      Prior Living Arrangements/Services Living arrangements for the past 2 months: Assisted Living Facility Lives with:: Facility Resident Patient language and need for interpreter reviewed:: Yes Do you feel safe going back to the place where you live?: No   SNF  Need for Family Participation in Patient Care: Yes (Comment) Care giver support system in place?: Yes (comment)   Criminal Activity/Legal Involvement Pertinent to Current Situation/Hospitalization: No - Comment as needed  Activities of Daily Living   ADL Screening (condition at time of  admission) Independently performs ADLs?: No Does the patient have a NEW difficulty with bathing/dressing/toileting/self-feeding that is expected to last >3 days?: Yes (Initiates electronic notice to provider for possible OT consult) Does the patient have a NEW difficulty with getting in/out of bed, walking, or climbing stairs that is expected to last >3 days?: Yes (Initiates electronic notice to provider for possible PT consult) Does the patient have a NEW difficulty with communication that is expected to last >3 days?: No Is the patient deaf or have difficulty hearing?: Yes Does the patient have difficulty seeing, even when wearing glasses/contacts?: No Does the patient have difficulty concentrating, remembering, or making decisions?: Yes  Permission Sought/Granted Permission sought to share information with : Case Manager, Family Supports, Magazine features editor                Emotional Assessment Appearance:: Appears stated age Attitude/Demeanor/Rapport: Unable to Assess Affect (typically observed): Unable to Assess Orientation: : Oriented to Self, Oriented to Place Alcohol / Substance Use: Not Applicable Psych Involvement: No (comment)  Admission diagnosis:  Unresponsive [R41.89] Acute encephalopathy [G93.40] AKI (acute kidney injury) (HCC) [N17.9] Non-traumatic rhabdomyolysis [M62.82] Patient Active Problem List   Diagnosis Date Noted   Acute encephalopathy 08/12/2023   Palliative care encounter 08/07/2021   Hyponatremia    Hypokalemia    Slow transit constipation    ICH (intracerebral hemorrhage) (HCC) 04/30/2020   Renal insufficiency 08/18/2019   Chronic anticoagulation 01/23/2015  HLD (hyperlipidemia) 01/23/2015   Essential hypertension 01/23/2015   Brain aneurysm 01/23/2015   History of CVA (cerebrovascular accident) 12/21/2014   Chronic atrial fibrillation (HCC) 12/21/2014   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 [place] [address] Phone: [phone] Fax: [phone]     Social Drivers of Health (SDOH) Social History: SDOH Screenings   Food Insecurity: No Food Insecurity (08/13/2023)  Housing: High Risk (08/13/2023)  Transportation Needs: No Transportation Needs (08/13/2023)  Utilities: Not At Risk (08/13/2023)  Depression (PHQ2-9): Medium Risk (06/07/2020)  Social Connections: Moderately Isolated (08/13/2023)  Tobacco Use: Low Risk  (08/12/2023)   SDOH Interventions:     Readmission Risk Interventions     No data to display

## 2023-08-14 NOTE — Care Management Important Message (Signed)
 Important Message  Patient Details  Name: Steven Meadows MRN: [id number] Date of Birth: 1931-05-30   Important Message Given:        Dorena Bodo 08/14/2023, 4:39 PM

## 2023-08-15 ENCOUNTER — Inpatient Hospital Stay (HOSPITAL_COMMUNITY)

## 2023-08-15 ENCOUNTER — Other Ambulatory Visit: Payer: Self-pay

## 2023-08-15 DIAGNOSIS — R569 Unspecified convulsions: Secondary | ICD-10-CM | POA: Diagnosis not present

## 2023-08-15 DIAGNOSIS — M7989 Other specified soft tissue disorders: Secondary | ICD-10-CM

## 2023-08-15 DIAGNOSIS — G9340 Encephalopathy, unspecified: Secondary | ICD-10-CM | POA: Diagnosis not present

## 2023-08-15 DIAGNOSIS — R29818 Other symptoms and signs involving the nervous system: Secondary | ICD-10-CM | POA: Diagnosis not present

## 2023-08-15 DIAGNOSIS — R509 Fever, unspecified: Secondary | ICD-10-CM | POA: Diagnosis not present

## 2023-08-15 DIAGNOSIS — R4182 Altered mental status, unspecified: Secondary | ICD-10-CM | POA: Diagnosis not present

## 2023-08-15 LAB — COMPREHENSIVE METABOLIC PANEL WITH GFR
ALT: 57 U/L — ABNORMAL HIGH (ref 0–44)
AST: 218 U/L — ABNORMAL HIGH (ref 15–41)
Albumin: 2.4 g/dL — ABNORMAL LOW (ref 3.5–5.0)
Alkaline Phosphatase: 19 U/L — ABNORMAL LOW (ref 38–126)
Anion gap: 10 (ref 5–15)
BUN: 12 mg/dL (ref 8–23)
CO2: 24 mmol/L (ref 22–32)
Calcium: 7.8 mg/dL — ABNORMAL LOW (ref 8.9–10.3)
Chloride: 99 mmol/L (ref 98–111)
Creatinine, Ser: 0.87 mg/dL (ref 0.61–1.24)
GFR, Estimated: >60 mL/min (ref >60)
Glucose, Bld: 109 mg/dL — ABNORMAL HIGH (ref 70–99)
Potassium: 2.9 mmol/L — ABNORMAL LOW (ref 3.5–5.1)
Sodium: 133 mmol/L — ABNORMAL LOW (ref 135–145)
Total Bilirubin: 0.8 mg/dL (ref 0.0–1.2)
Total Protein: 5 g/dL — ABNORMAL LOW (ref 6.5–8.1)

## 2023-08-15 LAB — CBC WITH DIFFERENTIAL/PLATELET
Abs Immature Granulocytes: 0.08 10*3/uL — ABNORMAL HIGH (ref 0.00–0.07)
Basophils Absolute: 0 10*3/uL (ref 0.0–0.1)
Basophils Relative: 0 %
Eosinophils Absolute: 0.1 10*3/uL (ref 0.0–0.5)
Eosinophils Relative: 1 %
HCT: 35.7 % — ABNORMAL LOW (ref 39.0–52.0)
Hemoglobin: 11.8 g/dL — ABNORMAL LOW (ref 13.0–17.0)
Immature Granulocytes: 1 %
Lymphocytes Relative: 11 %
Lymphs Abs: 1.1 10*3/uL (ref 0.7–4.0)
MCH: 28.3 pg (ref 26.0–34.0)
MCHC: 33.1 g/dL (ref 30.0–36.0)
MCV: 85.6 fL (ref 80.0–100.0)
Monocytes Absolute: 1.9 10*3/uL — ABNORMAL HIGH (ref 0.1–1.0)
Monocytes Relative: 19 %
Neutro Abs: 6.7 10*3/uL (ref 1.7–7.7)
Neutrophils Relative %: 68 %
Platelets: 131 10*3/uL — ABNORMAL LOW (ref 150–400)
RBC: 4.17 MIL/uL — ABNORMAL LOW (ref 4.22–5.81)
RDW: 13.7 % (ref 11.5–15.5)
WBC: 9.9 10*3/uL (ref 4.0–10.5)
nRBC: 0 % (ref 0.0–0.2)

## 2023-08-15 LAB — CULTURE, BLOOD (ROUTINE X 2): Special Requests: ADEQUATE

## 2023-08-15 LAB — MAGNESIUM: Magnesium: 1.7 mg/dL (ref 1.7–2.4)

## 2023-08-15 LAB — PHOSPHORUS: Phosphorus: 2.1 mg/dL — ABNORMAL LOW (ref 2.5–4.6)

## 2023-08-15 LAB — APTT: aPTT: 43 s — ABNORMAL HIGH (ref 24–36)

## 2023-08-15 LAB — C-REACTIVE PROTEIN: CRP: 7.2 mg/dL — ABNORMAL HIGH (ref <1.0)

## 2023-08-15 LAB — HEPARIN LEVEL (UNFRACTIONATED)
Heparin Unfractionated: 0.22 [IU]/mL — ABNORMAL LOW (ref 0.30–0.70)
Heparin Unfractionated: 0.17 [IU]/mL — ABNORMAL LOW (ref 0.30–0.70)

## 2023-08-15 LAB — BRAIN NATRIURETIC PEPTIDE: B Natriuretic Peptide: 298.3 pg/mL — ABNORMAL HIGH (ref 0.0–100.0)

## 2023-08-15 LAB — PROCALCITONIN: Procalcitonin: 0.18 ng/mL

## 2023-08-15 MED ORDER — DEXTROSE-SODIUM CHLORIDE 5-0.45 % IV SOLN: INTRAVENOUS | Status: DC

## 2023-08-15 MED ORDER — SODIUM CHLORIDE 0.9 % IV SOLN
3.0000 g | Freq: Four times a day (QID) | INTRAVENOUS | Status: DC
  Administered 2023-08-15 – 2023-08-18 (×13): 3 g via INTRAVENOUS
  Filled 2023-08-15 (×13): qty 8

## 2023-08-15 MED ORDER — CHLORHEXIDINE GLUCONATE CLOTH 2 % EX PADS
6.0000 | MEDICATED_PAD | Freq: Every day | CUTANEOUS | Status: DC
  Administered 2023-08-15 – 2023-08-18 (×4): 6 via TOPICAL

## 2023-08-15 MED ORDER — VALPROATE SODIUM 100 MG/ML IV SOLN
500.0000 mg | Freq: Three times a day (TID) | INTRAVENOUS | Status: DC
  Filled 2023-08-15 (×3): qty 5

## 2023-08-15 MED ORDER — HEPARIN (PORCINE) 25000 UT/250ML-% IV SOLN
1400.0000 [IU]/h | INTRAVENOUS | Status: DC
  Administered 2023-08-15: 1200 [IU]/h via INTRAVENOUS
  Administered 2023-08-17 – 2023-08-18 (×2): 1400 [IU]/h via INTRAVENOUS
  Filled 2023-08-15 (×4): qty 250

## 2023-08-15 MED ORDER — SODIUM CHLORIDE 0.9% FLUSH
10.0000 mL | Freq: Two times a day (BID) | INTRAVENOUS | Status: DC
  Administered 2023-08-15 – 2023-08-18 (×7): 10 mL

## 2023-08-15 MED ORDER — SODIUM CHLORIDE 0.9% FLUSH: 10.0000 mL | INTRAVENOUS | Status: DC | PRN

## 2023-08-15 MED ORDER — VALPROATE SODIUM 100 MG/ML IV SOLN
1500.0000 mg | Freq: Once | INTRAVENOUS | Status: AC
  Administered 2023-08-15: 1500 mg via INTRAVENOUS
  Filled 2023-08-15: qty 15

## 2023-08-15 MED ORDER — POTASSIUM CHLORIDE 10 MEQ/100ML IV SOLN
10.0000 meq | INTRAVENOUS | Status: AC
  Administered 2023-08-15 (×6): 10 meq via INTRAVENOUS
  Filled 2023-08-15 (×6): qty 100

## 2023-08-15 MED ORDER — VALPROATE SODIUM 100 MG/ML IV SOLN
500.0000 mg | Freq: Three times a day (TID) | INTRAVENOUS | Status: DC
  Administered 2023-08-15 – 2023-08-16 (×3): 500 mg via INTRAVENOUS
  Filled 2023-08-15 (×5): qty 5

## 2023-08-15 NOTE — Progress Notes (Signed)
 PHARMACY - ANTICOAGULATION CONSULT NOTE  Pharmacy Consult for heparin Indication: atrial fibrillation (on Eliquis PTA)  Allergies  Allergen Reactions   Nabumetone Other (See Comments)    Gi upset   Ace Inhibitors Cough   Flonase [Fluticasone Propionate] Other (See Comments)    Causes nosebleeds    Patient Measurements: Height: 5\' 11"  (180.3 cm) Weight: 90 kg (198 lb 6.6 oz) IBW/kg (Calculated) : 75.3 HEPARIN DW (KG): 90   Vital Signs: Temp: 99 F (37.2 C) (04/12 1200) Temp Source: Axillary (04/12 1200) BP: 135/94 (04/12 1600) Pulse Rate: 82 (04/12 1600)  Labs: Recent Labs    08/13/23 0406 08/13/23 0408 08/13/23 1403 08/14/23 0541 08/14/23 1003 08/15/23 0556 08/15/23 1855  HGB 14.6  --   --   --  12.8* 11.8*  --   HCT 43.8  --   --   --  37.4* 35.7*  --   PLT 208  --   --   --  158 131*  --   APTT 87*  --  75* 68*  --  43*  --   HEPARINUNFRC  --    < >  --  0.95*  --  0.22* 0.17*  CREATININE 1.18  --   --  1.08  --  0.87  --    < > = values in this interval not displayed.    Estimated Creatinine Clearance: 61.3 mL/min (by C-G formula based on SCr of 0.87 mg/dL).  Assessment: 59 YOM presenting with acute encephalopathy and concern for sepsis of unknown source of infection. Patient has a PMH significant for atrial fibrillation on Eliquis PTA, last dose given 4/8 PM.   He is s/p lumbar puncture and heparin was restarted today (not a candidate to return to oral apixaban at this time due to NPO status). Patient was recently therapeutic on 1200 units/ hr   Goal of Therapy:  Heparin level 0.3-0.7 units/ml aPTT 66-102 seconds Monitor platelets by anticoagulation protocol: Yes   Plan:  -Increase heparin to 1400 units/hr -heparin level and CBC in am  Baxter Limber, PharmD Clinical Pharmacist **Pharmacist phone directory can now be found on [email or web address] ([email or web address]).  Listed under [facility].

## 2023-08-15 NOTE — Plan of Care (Signed)

## 2023-08-15 NOTE — Progress Notes (Signed)
 Peripherally Inserted Central Catheter Placement  The IV Nurse has discussed with the patient and/or persons authorized to consent for the patient, the purpose of this procedure and the potential benefits and risks involved with this procedure.  The benefits include less needle sticks, lab draws from the catheter, and the patient may be discharged home with the catheter. Risks include, but not limited to, infection, bleeding, blood clot (thrombus formation), and puncture of an artery; nerve damage and irregular heartbeat and possibility to perform a PICC exchange if needed/ordered by physician.  Alternatives to this procedure were also discussed.  Bard Power PICC patient education guide, fact sheet on infection prevention and patient information card has been provided to patient /or left at bedside. Dtr at bedside signed consent, wife gave verbal consent via telephone call.  Pt  lethargic.   PICC Placement Documentation  PICC Double Lumen 08/15/23 Right Basilic 38 cm 0 cm (Active)  Indication for Insertion or Continuance of Line Vasoactive infusions;Limited venous access - need for IV therapy >5 days (PICC only) 08/15/23 1912  Exposed Catheter (cm) 0 cm 08/15/23 1912  Site Assessment Clean, Dry, Intact 08/15/23 1912  Lumen #1 Status Flushed;Saline locked;Blood return noted 08/15/23 1912  Lumen #2 Status Flushed;Saline locked;Blood return noted 08/15/23 1912  Dressing Type Transparent;Securing device 08/15/23 1912  Dressing Status Antimicrobial disc/dressing in place;Clean, Dry, Intact 08/15/23 1912  Line Care Connections checked and tightened 08/15/23 1912  Line Adjustment (NICU/IV Team Only) No 08/15/23 1912  Dressing Intervention New dressing;Adhesive placed at insertion site (IV team only);Adhesive placed around edges of dressing (IV team/ICU RN only) 08/15/23 1912  Dressing Change Due 08/22/23 08/15/23 1912       Allegra Arch 08/15/2023, 7:13 PM

## 2023-08-15 NOTE — Plan of Care (Signed)
  Problem: Education: Goal: Knowledge of General Education information will improve Description: Including pain rating scale, medication(s)/side effects and non-pharmacologic comfort measures Outcome: Progressing   Problem: Health Behavior/Discharge Planning: Goal: Ability to manage health-related needs will improve Outcome: Progressing   Problem: Clinical Measurements: Goal: Will remain free from infection Outcome: Progressing Goal: Diagnostic test results will improve Outcome: Progressing Goal: Respiratory complications will improve Outcome: Progressing Goal: Cardiovascular complication will be avoided Outcome: Progressing   Problem: Activity: Goal: Risk for activity intolerance will decrease Outcome: Progressing   Problem: Elimination: Goal: Will not experience complications related to urinary retention Outcome: Progressing   Problem: Pain Managment: Goal: General experience of comfort will improve and/or be controlled Outcome: Progressing

## 2023-08-15 NOTE — Progress Notes (Signed)
 PICC consent obtained from daughter at bedside and wife via telephone.  Debbie Rn at bedside.  All parties aware that PICC will be placed after BUE Dopplers are completed.  Will attempt to contact for estimated ETA.

## 2023-08-15 NOTE — Progress Notes (Signed)
 EEG complete - results pending

## 2023-08-15 NOTE — Progress Notes (Addendum)
 PROGRESS NOTE                                                                                                                                                                                                             Patient Demographics:    Steven Meadows, is a 89 y.o. male, DOB - [birth year], ZOX:[id number]  Outpatient Primary MD for the patient is Pcp, No    LOS - 3  Admit date - 08/12/2023    No chief complaint on file.      Brief Narrative (HPI from H&P)   89 y.o. male with past medical history significant of hypertension, hyperlipidemia, atrial fibrillation, stroke, intracerebral hemorrhage, renal disease presented to hospital after found to be unresponsive at his assisted living facility.  He had been having progressive weakness recently and was found facedown on the floor.  Her workup in the ER was suggestive of possible acute and subacute stroke, ongoing seizures, suspicious for meningitis/encephalitis and he was admitted to the hospital, neurology was consulted.   Subjective:    Steven Meadows today in bed, nearly obtunded unable to provide any review of systems   Assessment  & Plan :   Acute metabolic encephalopathy with sepsis present on admission.   Recently moved to assisted living facility where he was exposed by a sick contact diagnosed with herpes, subsequently found unresponsive by staff at assisted living facility.  Workup in the hospital has so far revealed acute to subacute strokes on MRI, ongoing seizures on EEG suspicious for status epilepticus, leukocytosis, near obtunded state.  Case discussed with neurology in detail on 08/15/2023, LP showed CSF not consistent with infection, MRI changes believe more likely due to prolonged seizure versus stroke.  Currently on AEDs, mentation gradually improving, de-escalate antibiotics   Sepsis clinically ruled out, leukocytosis and fever likely reactionary from status  epilepticus, de-escalate antibiotics to Unasyn, accounting for mild clinical aspiration while he was seizing.  Follow cultures.  1 out of 2 blood cultures with staph epidermis contamination.  Respiratory viral panel negative.  History of CVA  MRI noted, acute to subacute strokes, echocardiogram stable, continue heparin drip for underlying A-fib, PT OT and speech eval, continue combination of Crestor and Zetia.   History of atrial fibrillation.  Has been started on heparin drip.  Was on Eliquis at home.  Currently on hold.  Might need lumbar puncture.  As needed Cardizem.   Hypertension as needed Cardizem.   Hypokalemia.  Replace.    Hyperlipidemia  -Home Zetia continue at home dose, Crestor dose will be increased once he is taking oral on hold.  LDL was above goal.  Mild bilateral upper extremity swelling.  Obtain ultrasound.      Condition - Extremely Guarded  Family Communication  : Updated daughter in detail on 08/14/2023, family member bedside 08/15/2022  Code Status : DNR  Consults  : Neurology  PUD Prophylaxis : PPI   Procedures  :     MRI brain.   subtle diffusion signal abnormality in the right hippocampus with areas of restricted diffusion noted within the body and tail of the hippocampus. Additional subtle diffusion signal abnormality and restricted diffusion involving the right dorsal thalamus. Findings are concerning for acute to early subacute infarcts.   CTA head and neck.   CTA neck: 1. The common carotid and internal carotid arteries are patent within the neck without hemodynamically significant stenosis. Atherosclerotic plaque about the carotid bifurcations and within the proximal internal carotid arteries, bilaterally. 2. The left vertebral artery is non-dominant and developmentally diminutive, but patent throughout the neck. 3. The dominant right vertebral artery is patent within the neck. Non-stenotic atherosclerotic plaque at the origin of this vessel. 4. Aortic  Atherosclerosis (ICD10-I70.0).   CTA head: 1. No proximal intracranial large vessel occlusion identified. 2. Intracranial atherosclerotic disease with multifocal stenoses, most notably as follows. 3. Severe stenosis within a left PCA branch at the P2/P3 junction, new from prior exams. 4. Sites of up to moderate stenosis within the intracranial right vertebral artery. 5. Up to moderate stenosis of the supraclinoid right internal carotid artery. 6. 7-8 mm aneurysm at the vertebrobasilar junction on the right, unchanged in size from the CTA of 04/30/2020  EEG.  Positive for seizures.    Echocardiogram.  1. Left ventricular ejection fraction, by estimation, is 60 to 65%. The left ventricle has normal function. The left ventricle has no regional wall motion abnormalities. Left ventricular diastolic parameters are indeterminate.  2. Right ventricular systolic function is moderately reduced. The right ventricular size is moderately enlarged. There is normal pulmonary artery systolic pressure. The estimated right ventricular systolic pressure is 33.5 mmHg.  3. Left atrial size was severely dilated.  4. Right atrial size was severely dilated.  5. The mitral valve was not well visualized. Trivial mitral valve regurgitation. Moderate mitral annular calcification.  6. The aortic valve is tricuspid. Aortic valve regurgitation is not visualized. Aortic valve sclerosis/calcification is present, without any evidence of aortic stenosis.  7. Aortic dilatation noted. There is mild dilatation of the ascending aorta.  8. The inferior vena cava is dilated in size with <50% respiratory variability, suggesting right atrial pressure of 15 mmHg  LP -CSF not consistent with infection.      Disposition Plan  :    Status is: Inpatient  DVT Prophylaxis  :  SCDs/Hep gtt  Lab Results  Component Value Date   PLT 131 (L) 08/15/2023    Diet :  Diet Order             Diet NPO time specified  Diet effective now                     Inpatient Medications  Scheduled Meds:  pantoprazole (PROTONIX) IV  40 mg Intravenous Q24H   sodium chloride flush  3 mL Intravenous Q12H   Continuous  Infusions:  dextrose 5 % and 0.45 % NaCl     lacosamide (VIMPAT) IV 100 mg (08/14/23 2135)   potassium chloride     valproate sodium     valproate sodium     PRN Meds:.acetaminophen **OR** acetaminophen, diltiazem, LORazepam, polyethylene glycol    Objective:   Vitals:   08/15/23 0001 08/15/23 0402 08/15/23 0700 08/15/23 0800  BP: 137/76  127/73 129/89  Pulse: 96  92 87  Resp: (!) 24  (!) 24 (!) 0  Temp: 98.6 F (37 C) 99.4 F (37.4 C)  99.3 F (37.4 C)  TempSrc: Axillary Axillary    SpO2: 95%  96% 99%  Weight:      Height:        Wt Readings from Last 3 Encounters:  08/13/23 90 kg  01/19/22 90.7 kg  06/10/21 98.9 kg     Intake/Output Summary (Last 24 hours) at 08/15/2023 0931 Last data filed at 08/15/2023 0533 Gross per 24 hour  Intake --  Output 1450 ml  Net -1450 ml     Physical Exam  Somnolent and thoroughly confused, no new F.N deficits,   [place].AT,PERRAL Supple Neck, No JVD,   Symmetrical Chest wall movement, Good air movement bilaterally, CTAB RRR,No Gallops,Rubs or new Murmurs,  +ve B.Sounds, Abd Soft, No tenderness,   No Cyanosis, Clubbing or edema      Data Review:    Recent Labs  Lab 08/12/23 1037 08/12/23 1044 08/13/23 0406 08/14/23 1003 08/15/23 0556  WBC 21.4*  --  15.1* 12.0* 9.9  HGB 15.7 16.7 14.6 12.8* 11.8*  HCT 48.0 49.0 43.8 37.4* 35.7*  PLT 246  --  208 158 131*  MCV 87.6  --  85.0 85.8 85.6  MCH 28.6  --  28.3 29.4 28.3  MCHC 32.7  --  33.3 34.2 33.1  RDW 13.5  --  13.8 13.9 13.7  LYMPHSABS 1.0  --   --   --  1.1  MONOABS 2.7*  --   --   --  1.9*  EOSABS 0.0  --   --   --  0.1  BASOSABS 0.0  --   --   --  0.0    Recent Labs  Lab 08/12/23 1037 08/12/23 1044 08/12/23 1200 08/12/23 1412 08/13/23 0406 08/14/23 0541 08/14/23 0609 08/14/23 1003  08/15/23 0556  NA 133* 132*  --   --  134* 134*  --   --  133*  K 4.9 5.0  --   --  3.7 3.2*  --   --  2.9*  CL 97* 100  --   --  99 100  --   --  99  CO2 18*  --   --   --  22 23  --   --  24  ANIONGAP 18*  --   --   --  13 11  --   --  10  GLUCOSE 169* 171*  --   --  150* 112*  --   --  109*  BUN 15 17  --   --  14 16  --   --  12  CREATININE 1.50* 1.50*  --   --  1.18 1.08  --   --  0.87  AST 49*  --   --   --  266*  --   --   --  218*  ALT 31  --   --   --  61*  --   --   --  57*  ALKPHOS 31*  --   --   --  24*  --   --   --  19*  BILITOT 0.5  --   --   --  1.0  --   --   --  0.8  ALBUMIN 4.2  --   --   --  3.2*  --   --   --  2.4*  CRP  --   --   --   --   --   --   --  11.1* 7.2*  PROCALCITON  --   --   --   --   --   --  0.28  --   --   LATICACIDVEN  --   --  3.5* 3.8*  --   --   --   --   --   INR 1.3*  --   --   --   --   --   --   --   --   BNP  --   --   --   --   --   --   --  295.7* 298.3*  MG  --   --   --   --   --  1.8  --   --  1.7  PHOS  --   --   --   --   --   --   --   --  2.1*  CALCIUM 9.1  --   --   --  8.6* 8.3*  --   --  7.8*      Recent Labs  Lab 08/12/23 1037 08/12/23 1200 08/12/23 1412 08/13/23 0406 08/14/23 0541 08/14/23 0609 08/14/23 1003 08/15/23 0556  CRP  --   --   --   --   --   --  11.1* 7.2*  PROCALCITON  --   --   --   --   --  0.28  --   --   LATICACIDVEN  --  3.5* 3.8*  --   --   --   --   --   INR 1.3*  --   --   --   --   --   --   --   BNP  --   --   --   --   --   --  295.7* 298.3*  MG  --   --   --   --  1.8  --   --  1.7  CALCIUM 9.1  --   --  8.6* 8.3*  --   --  7.8*    --------------------------------------------------------------------------------------------------------------- Lab Results  Component Value Date   CHOL 132 12/08/2020   HDL 26 (L) 12/08/2020   LDLCALC 98 12/08/2020   TRIG 39 12/08/2020   CHOLHDL 5.1 12/08/2020    Lab Results  Component Value Date   HGBA1C 6.0 (H) 12/08/2020      Micro  Results Recent Results (from the past 240 hours)  Culture, blood (routine x 2)     Status: None (Preliminary result)   Collection Time: 08/12/23 11:22 AM   Specimen: BLOOD LEFT HAND  Result Value Ref Range Status   Specimen Description BLOOD LEFT HAND  Final   Special Requests   Final    BOTTLES DRAWN AEROBIC AND ANAEROBIC Blood Culture results may not be optimal due to an inadequate volume of blood received in culture bottles   Culture   Final  NO GROWTH 3 DAYS Performed at [facility], [address] [place], [place] [address]    Report Status PENDING  Incomplete  Resp panel by RT-PCR (RSV, Flu A&B, Covid) Anterior Nasal Swab     Status: None   Collection Time: 08/12/23 11:26 AM   Specimen: Anterior Nasal Swab  Result Value Ref Range Status   SARS Coronavirus 2 by RT PCR NEGATIVE NEGATIVE Final   Influenza A by PCR NEGATIVE NEGATIVE Final   Influenza B by PCR NEGATIVE NEGATIVE Final    Comment: (NOTE) The Xpert Xpress SARS-CoV-2/FLU/RSV plus assay is intended as an aid in the diagnosis of influenza from Nasopharyngeal swab specimens and should not be used as a sole basis for treatment. Nasal washings and aspirates are unacceptable for Xpert Xpress SARS-CoV-2/FLU/RSV testing.  Fact Sheet for Patients: BloggerCourse.com  Fact Sheet for Healthcare Providers: SeriousBroker.it  This test is not yet approved or cleared by the [place]  FDA and has been authorized for detection and/or diagnosis of SARS-CoV-2 by FDA under an Emergency Use Authorization (EUA). This EUA will remain in effect (meaning this test can be used) for the duration of the COVID-19 declaration under Section 564(b)(1) of the Act, 21 U.S.C. section 360bbb-3(b)(1), unless the authorization is terminated or revoked.     Resp Syncytial Virus by PCR NEGATIVE NEGATIVE Final    Comment: (NOTE) Fact Sheet for  Patients: BloggerCourse.com  Fact Sheet for Healthcare Providers: SeriousBroker.it  This test is not yet approved or cleared by the [place]  FDA and has been authorized for detection and/or diagnosis of SARS-CoV-2 by FDA under an Emergency Use Authorization (EUA). This EUA will remain in effect (meaning this test can be used) for the duration of the COVID-19 declaration under Section 564(b)(1) of the Act, 21 U.S.C. section 360bbb-3(b)(1), unless the authorization is terminated or revoked.  Performed at [facility], [address], [place], [place] [address]   Culture, blood (routine x 2)     Status: Abnormal   Collection Time: 08/12/23 11:55 AM   Specimen: BLOOD LEFT ARM  Result Value Ref Range Status   Specimen Description BLOOD LEFT ARM  Final   Special Requests   Final    BOTTLES DRAWN AEROBIC AND ANAEROBIC Blood Culture adequate volume   Culture  Setup Time   Final    GRAM POSITIVE COCCI ANAEROBIC BOTTLE ONLY CRITICAL RESULT CALLED TO, READ BACK BY AND VERIFIED WITH: PHARMD [facility] on [phone] @0755  by SM    Culture (A)  Final    STAPHYLOCOCCUS EPIDERMIDIS THE SIGNIFICANCE OF ISOLATING THIS ORGANISM FROM A SINGLE SET OF BLOOD CULTURES WHEN MULTIPLE SETS ARE DRAWN IS UNCERTAIN. PLEASE NOTIFY THE MICROBIOLOGY DEPARTMENT WITHIN ONE WEEK IF SPECIATION AND SENSITIVITIES ARE REQUIRED. Performed at [facility], [address] [place], [place] [address]    Report Status 08/15/2023 FINAL  Final  Blood Culture ID Panel (Reflexed)     Status: Abnormal   Collection Time: 08/12/23 11:55 AM  Result Value Ref Range Status   Enterococcus faecalis NOT DETECTED NOT DETECTED Final   Enterococcus Faecium NOT DETECTED NOT DETECTED Final   Listeria monocytogenes NOT DETECTED NOT DETECTED Final   Staphylococcus species DETECTED (A) NOT DETECTED Final    Comment: CRITICAL RESULT CALLED TO, READ BACK BY AND VERIFIED  WITH: PHARMD [facility] on [phone] @0755  by SM    Staphylococcus aureus (BCID) NOT DETECTED NOT DETECTED Final   Staphylococcus epidermidis DETECTED (A) NOT DETECTED Final  Comment: Methicillin (oxacillin) resistant coagulase negative staphylococcus. Possible blood culture contaminant (unless isolated from more than one blood culture draw or clinical case suggests pathogenicity). No antibiotic treatment is indicated for blood  culture contaminants. CRITICAL RESULT CALLED TO, READ BACK BY AND VERIFIED WITH: PHARMD Bailey A on [phone] @0755  by SM    Staphylococcus lugdunensis NOT DETECTED NOT DETECTED Final   Streptococcus species NOT DETECTED NOT DETECTED Final   Streptococcus agalactiae NOT DETECTED NOT DETECTED Final   Streptococcus pneumoniae NOT DETECTED NOT DETECTED Final   Streptococcus pyogenes NOT DETECTED NOT DETECTED Final   A.calcoaceticus-baumannii NOT DETECTED NOT DETECTED Final   Bacteroides fragilis NOT DETECTED NOT DETECTED Final   Enterobacterales NOT DETECTED NOT DETECTED Final   Enterobacter cloacae complex NOT DETECTED NOT DETECTED Final   Escherichia coli NOT DETECTED NOT DETECTED Final   Klebsiella aerogenes NOT DETECTED NOT DETECTED Final   Klebsiella oxytoca NOT DETECTED NOT DETECTED Final   Klebsiella pneumoniae NOT DETECTED NOT DETECTED Final   Proteus species NOT DETECTED NOT DETECTED Final   Salmonella species NOT DETECTED NOT DETECTED Final   Serratia marcescens NOT DETECTED NOT DETECTED Final   Haemophilus influenzae NOT DETECTED NOT DETECTED Final   Neisseria meningitidis NOT DETECTED NOT DETECTED Final   Pseudomonas aeruginosa NOT DETECTED NOT DETECTED Final   Stenotrophomonas maltophilia NOT DETECTED NOT DETECTED Final   Candida albicans NOT DETECTED NOT DETECTED Final   Candida auris NOT DETECTED NOT DETECTED Final   Candida glabrata NOT DETECTED NOT DETECTED Final   Candida krusei NOT DETECTED NOT DETECTED Final   Candida parapsilosis NOT DETECTED NOT  DETECTED Final   Candida tropicalis NOT DETECTED NOT DETECTED Final   Cryptococcus neoformans/gattii NOT DETECTED NOT DETECTED Final   Methicillin resistance mecA/C DETECTED (A) NOT DETECTED Final    Comment: CRITICAL RESULT CALLED TO, READ BACK BY AND VERIFIED WITH: PHARMD [facility] on [phone] @0755  by SM Performed at [facility], [address] [place], [place] [address]   MRSA Next Gen by PCR, Nasal     Status: None   Collection Time: 08/14/23  8:48 AM   Specimen: Nasal Mucosa; Nasal Swab  Result Value Ref Range Status   MRSA by PCR Next Gen NOT DETECTED NOT DETECTED Final    Comment: (NOTE) The GeneXpert MRSA Assay (FDA approved for NASAL specimens only), is one component of a comprehensive MRSA colonization surveillance program. It is not intended to diagnose MRSA infection nor to guide or monitor treatment for MRSA infections. Test performance is not FDA approved in patients less than 47 years old. Performed at [facility], [address], [place], [place] [address]   CSF culture w Gram Stain     Status: None (Preliminary result)   Collection Time: 08/14/23  3:57 PM   Specimen: CSF; Cerebrospinal Fluid  Result Value Ref Range Status   Specimen Description CSF  Final   Special Requests NONE  Final   Gram Stain   Final    WBC PRESENT, PREDOMINANTLY MONONUCLEAR NO ORGANISMS SEEN CYTOSPIN SMEAR    Culture   Final    NO GROWTH < 24 HOURS Performed at [facility], [address], [place], [place] [address]    Report Status PENDING  Incomplete  Culture, fungus without smear     Status: None (Preliminary result)   Collection Time: 08/14/23  3:57 PM   Specimen: CSF; Cerebrospinal Fluid  Result Value Ref Range Status   Specimen Description CSF  Final   Special Requests NONE  Final   Culture   Final    NO GROWTH < 24 HOURS Performed at [facility], [address], [place], [place] [address]    Report Status PENDING  Incomplete    Radiology  Report ECHOCARDIOGRAM COMPLETE Result Date: 08/14/2023    ECHOCARDIOGRAM REPORT   Patient Name:   Steven Meadows Date of Exam: 08/14/2023 Medical Rec #:  [id number]      Height:       71.0 in Accession #:    [id number]     Weight:       198.4 lb Date of Birth:  1931-06-16       BSA:          2.101 m Patient Age:    89 years       BP:           138/86 mmHg Patient Gender: M              HR:           97 bpm. Exam Location:  Inpatient Procedure: 2D Echo, Cardiac Doppler and Color Doppler (Both Spectral and Color            Flow Doppler were utilized during procedure). Indications:    CHF-Acute Diastolic I50.31  History:        Patient has prior history of Echocardiogram examinations, most                 recent 12/08/2020.  Sonographer:    Hersey Lorenzo RDCS Referring Phys: Florie Husband Andrez Banker [facility] IMPRESSIONS  1. Left ventricular ejection fraction, by estimation, is 60 to 65%. The left ventricle has normal function. The left ventricle has no regional wall motion abnormalities. Left ventricular diastolic parameters are indeterminate.  2. Right ventricular systolic function is moderately reduced. The right ventricular size is moderately enlarged. There is normal pulmonary artery systolic pressure. The estimated right ventricular systolic pressure is 33.5 mmHg.  3. Left atrial size was severely dilated.  4. Right atrial size was severely dilated.  5. The mitral valve was not well visualized. Trivial mitral valve regurgitation. Moderate mitral annular calcification.  6. The aortic valve is tricuspid. Aortic valve regurgitation is not visualized. Aortic valve sclerosis/calcification is present, without any evidence of aortic stenosis.  7. Aortic dilatation noted. There is mild dilatation of the ascending aorta.  8. The inferior vena cava is dilated in size with <50% respiratory variability, suggesting right atrial pressure of 15 mmHg. Conclusion(s)/Recommendation(s): Technically very limited study due to poor sound wave  transmission. FINDINGS  Left Ventricle: Left ventricular ejection fraction, by estimation, is 60 to 65%. The left ventricle has normal function. The left ventricle has no regional wall motion abnormalities. The left ventricular internal cavity size was normal in size. There is  no left ventricular hypertrophy. Left ventricular diastolic parameters are indeterminate. Right Ventricle: The right ventricular size is moderately enlarged. No increase in right ventricular wall thickness. Right ventricular systolic function is moderately reduced. There is normal pulmonary artery systolic pressure. The tricuspid regurgitant velocity is 2.15 m/s, and with an assumed right atrial pressure of 15 mmHg, the estimated right ventricular systolic pressure is 33.5 mmHg. Left Atrium: Left atrial size was severely dilated. Right Atrium: Right atrial size was severely dilated. Pericardium: There is no evidence of pericardial effusion. Mitral Valve: The mitral valve was not well visualized. Moderate mitral annular calcification. Trivial mitral valve regurgitation. Tricuspid Valve: The tricuspid valve  is normal in structure. Tricuspid valve regurgitation is mild. Aortic Valve: The aortic valve is tricuspid. Aortic valve regurgitation is not visualized. Aortic valve sclerosis/calcification is present, without any evidence of aortic stenosis. Pulmonic Valve: The pulmonic valve was grossly normal. Pulmonic valve regurgitation is trivial. Aorta: Aortic dilatation noted. There is mild dilatation of the ascending aorta. Venous: The inferior vena cava is dilated in size with less than 50% respiratory variability, suggesting right atrial pressure of 15 mmHg. IAS/Shunts: No atrial level shunt detected by color flow Doppler.  IVC IVC diam: 2.60 cm LEFT ATRIUM           Index        RIGHT ATRIUM           Index LA Vol (A4C): 99.9 ml 47.54 ml/m  RA Area:     22.50 cm                                    RA Volume:   67.20 ml  31.98 ml/m  AORTIC  VALVE LVOT Vmax:   67.10 cm/s LVOT Vmean:  50.700 cm/s LVOT VTI:    0.116 m  AORTA Ao Root diam: 4.10 cm Ao Asc diam:  4.00 cm TRICUSPID VALVE TR Peak grad:   18.5 mmHg TR Vmax:        215.00 cm/s  SHUNTS Systemic VTI: 0.12 m Arvilla Meres MD Electronically signed by Arvilla Meres MD Signature Date/Time: 08/14/2023/3:43:56 PM    Final    DG Chest Port 1 View Result Date: 08/14/2023 CLINICAL DATA:  Shortness of breath EXAM: PORTABLE CHEST 1 VIEW COMPARISON:  Two days prior FINDINGS: Generous heart size and stable mediastinal contours. Mild hazy density at the bases favoring atelectasis. There is no edema, consolidation, effusion, or pneumothorax. IMPRESSION: Mild hazy density at the bases favoring atelectasis. Electronically Signed   By: Tiburcio Pea M.D.   On: 08/14/2023 06:29   Overnight EEG with video Result Date: 08/14/2023 Charlsie Quest, MD     08/15/2023  7:03 AM Patient Name: Steven Meadows MRN: [id number] Epilepsy Attending: Charlsie Quest Referring Physician/Provider: Rejeana Brock, MD Duration: 08/13/2023 1234 to 08/14/2023 1234 Patient history: a 89 y.o. male with a history of previous stroke, encephalitis at age 20, atrial fibrillation on Eliquis, recent decline who presented on 08/12/2023 with decreased responsiveness.  EEG to evaluate for seizure Level of alertness: awake AEDs during EEG study: LEV Technical aspects: This EEG study was done with scalp electrodes positioned according to the 10-20 International system of electrode placement. Electrical activity was reviewed with band pass filter of 1-70Hz , sensitivity of 7 uV/mm, display speed of 45mm/sec with a 60Hz  notched filter applied as appropriate. EEG data were recorded continuously and digitally stored.  Video monitoring was available and reviewed as appropriate. Description: EEG showed continuous generalized 3 to 6 Hz theta-delta slowing. Lateralized periodic discharges were noted in right hemisphere, maximal right  posterior quadrant with fluctuating frequency of 0.5-1.5Hz  Hz. Five seizures without clinical signs were noted arising from right hemisphere on 08/13/2023 at 1249, 1358, 1423, 1442 and 1525. During seizure, EEG showed rhythmic 5-6hz  theta slowing in right hemisphere which then became higher amplitude and evolved into 2-3hz  delta slowing admixed with spikes. Duration of seizure was about 2 minutes each. Hyperventilation and photic stimulation were not performed.   ABNORMALITY - Seizures without clinical signs, right hemisphere - Lateralized periodic discharges, right hemisphere, maximal  right posterior quadrant - Continuous slow, generalized IMPRESSION: This study showed five seizures without clinical signs were noted arising from right hemisphere on 08/13/2023 at 1249, 1358, 1423, 1442 and 1525 lasting about 2 minutes each.  Additionally there is evidence of epileptogenicity arising from right hemisphere, maximal right posterior quadrant. This EEG pattern is on the ictal-interictal continuum with high risk of seizure recurrence.  Lastly there is moderate diffuse encephalopathy. Priyanka O Yadav     Signature  -   Lynnwood Sauer M.D on 08/15/2023 at 9:31 AM   -  To page go to [email or web address]

## 2023-08-15 NOTE — TOC Progression Note (Addendum)
 Transition of Care [facility]) - Initial/Assessment Note    Patient Details  Name: Steven Meadows MRN: [id number] Date of Birth: Feb 24, 1932  Transition of Care [facility]) CM/SW Contact:    Maya Sparrow, LCSW Phone Number: 08/15/2023, 11:50 AM  Clinical Narrative:                 CSW contacted [place] with [facility] to inquire about SNF bed availability when patient is medically ready.  There was no answer.  CSW LM requesting a returned call.   PASSR completed.   PASSR [phone]  TOC will continue to follow.  Expected Discharge Plan: Skilled Nursing Facility Barriers to Discharge: Continued Medical Work up   Patient Goals and CMS Choice     Choice offered to / list presented to : Spouse, Adult Children (patient and patients son)      Expected Discharge Plan and Services In-house Referral: Clinical Social Work     Living arrangements for the past 2 months: Assisted Living Facility                                      Prior Living Arrangements/Services Living arrangements for the past 2 months: Assisted Living Facility Lives with:: Facility Resident Patient language and need for interpreter reviewed:: Yes Do you feel safe going back to the place where you live?: No   SNF  Need for Family Participation in Patient Care: Yes (Comment) Care giver support system in place?: Yes (comment)   Criminal Activity/Legal Involvement Pertinent to Current Situation/Hospitalization: No - Comment as needed  Activities of Daily Living   ADL Screening (condition at time of admission) Independently performs ADLs?: No Does the patient have a NEW difficulty with bathing/dressing/toileting/self-feeding that is expected to last >3 days?: Yes (Initiates electronic notice to provider for possible OT consult) Does the patient have a NEW difficulty with getting in/out of bed, walking, or climbing stairs that is expected to last >3 days?: Yes (Initiates electronic notice to provider for  possible PT consult) Does the patient have a NEW difficulty with communication that is expected to last >3 days?: No Is the patient deaf or have difficulty hearing?: Yes Does the patient have difficulty seeing, even when wearing glasses/contacts?: No Does the patient have difficulty concentrating, remembering, or making decisions?: Yes  Permission Sought/Granted Permission sought to share information with : Case Manager, Family Supports, Magazine features editor                Emotional Assessment Appearance:: Appears stated age Attitude/Demeanor/Rapport: Unable to Assess Affect (typically observed): Unable to Assess Orientation: : Oriented to Self, Oriented to Place Alcohol / Substance Use: Not Applicable Psych Involvement: No (comment)  Admission diagnosis:  Unresponsive [R41.89] Acute encephalopathy [G93.40] AKI (acute kidney injury) (HCC) [N17.9] Non-traumatic rhabdomyolysis [M62.82] Patient Active Problem List   Diagnosis Date Noted   Acute encephalopathy 08/12/2023   Palliative care encounter 08/07/2021   Hyponatremia    Hypokalemia    Slow transit constipation    ICH (intracerebral hemorrhage) (HCC) 04/30/2020   Renal insufficiency 08/18/2019   Chronic anticoagulation 01/23/2015   HLD (hyperlipidemia) 01/23/2015   Essential hypertension 01/23/2015   Brain aneurysm 01/23/2015   History of CVA (cerebrovascular accident) 12/21/2014   Chronic atrial fibrillation (HCC) 12/21/2014   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 [place] [address] Phone: [phone] Fax: [phone]     Social Drivers of Health (SDOH) Social History: SDOH Screenings   Food Insecurity: No Food Insecurity (08/13/2023)  Housing: High Risk (08/13/2023)  Transportation Needs: No Transportation Needs (08/13/2023)  Utilities: Not At Risk  (08/13/2023)  Depression (PHQ2-9): Medium Risk (06/07/2020)  Social Connections: Moderately Isolated (08/13/2023)  Tobacco Use: Low Risk  (08/12/2023)   SDOH Interventions:     Readmission Risk Interventions     No data to display

## 2023-08-15 NOTE — Progress Notes (Signed)
 NEUROLOGY CONSULT FOLLOW UP NOTE   Date of service: August 15, 2023 Patient Name: Steven Meadows MRN:  [id number] DOB:  10-May-1931  Brief HPI  Steven Meadows is a 89 y.o. male with a history of previous stroke, encephalitis at age 66, atrial fibrillation on Eliquis, recent decline who presented on 08/12/2023 with decreased responsiveness.  On my assessment initially, he was improving and no longer unresponsive, but he did have a right gaze preference, and it apparent left neglect as well.  MRI and CTA have been negative on 4/9.  Routine EEG on 4/9 was negative.  He has an elevated white count and elevated fever to 102.2.  He was started on empiric CNS coverage, but LP on 4/11 was negative.  EEG on 4/9 was initially negative, but repeat continuous EEG from 4/10 to 4/11 revealed multiple seizures prior to being loaded with antiepileptics with no seizures following load, but continued periodic discharges.  This improved over the course of the study.  He had no seizures through 1045pm when he was disconnected.   Interval Hx/subjective   He continues to be very very sleepy.  Vitals   Vitals:   08/15/23 0001 08/15/23 0402 08/15/23 0700 08/15/23 0800  BP: 137/76  127/73 129/89  Pulse: 96  92 87  Resp: (!) 24  (!) 24 (!) 0  Temp: 98.6 F (37 C) 99.4 F (37.4 C)  99.3 F (37.4 C)  TempSrc: Axillary Axillary    SpO2: 95%  96% 99%  Weight:      Height:         Body mass index is 27.67 kg/m.  Physical Exam   General, in bed, NAD  Neurologic Examination   Gen: in bed, NAD  Neuro: MS: He is somnolent but arousable able to answer simple questions and follow commands. CN: He has a right gaze preference,, he does cross midline for me this morning which is the first time I have seen it, but he continues to have a very significant right gaze preference. Motor: He does not move the left side is much as the right, appears to have a left hemiparesis but is able to squeeze his fingers on the  left. Sensory: He reports sensation bilaterally  Labs and Diagnostic Imaging   CBC:  Recent Labs  Lab 08/12/23 1037 08/12/23 1044 08/14/23 1003 08/15/23 0556  WBC 21.4*   < > 12.0* 9.9  NEUTROABS 17.4*  --   --  6.7  HGB 15.7   < > 12.8* 11.8*  HCT 48.0   < > 37.4* 35.7*  MCV 87.6   < > 85.8 85.6  PLT 246   < > 158 131*   < > = values in this interval not displayed.    Basic Metabolic Panel:  Lab Results  Component Value Date   NA 133 (L) 08/15/2023   K 2.9 (L) 08/15/2023   CO2 24 08/15/2023   GLUCOSE 109 (H) 08/15/2023   BUN 12 08/15/2023   CREATININE 0.87 08/15/2023   CALCIUM 7.8 (L) 08/15/2023   GFRNONAA >60 08/15/2023   GFRAA 61 08/18/2019   Lipid Panel:  Lab Results  Component Value Date   LDLCALC 98 12/08/2020   HgbA1c:  Lab Results  Component Value Date   HGBA1C 6.0 (H) 12/08/2020   Urine Drug Screen:     Component Value Date/Time   LABOPIA NONE DETECTED 08/12/2023 1300   COCAINSCRNUR NONE DETECTED 08/12/2023 1300   LABBENZ NONE DETECTED 08/12/2023 1300   AMPHETMU  NONE DETECTED 08/12/2023 1300   THCU NONE DETECTED 08/12/2023 1300   LABBARB NONE DETECTED 08/12/2023 1300     Imaging(Personally reviewed): MRI-he has subtle diffusion change in the left medial temporal lobe CTA is negative   Impression   Steven Meadows is a 89 y.o. male presenting with fever, altered mental status, focal neurological deficits with negative imaging.  He has been started on empiric CNS coverage, and I do have significant concern that this is a possible CNS infection, but it could be that his neurological exam was completely explained by seizures.    He was found to have recurrent focal right hemispheric seizures.  On MRI, there is some subtle restricted diffusion change in the right hemisphere, I suspect that this is more likely related to seizure effect than stroke.  In any case, secondary prevention would consist of anticoagulation.   He is very somnolent, this is  possibly due to aspiration/delirium, but I also have concerns that Keppra was responsible as he was not somnolent prior to starting medications(despite having seizures).  I will change Keppra to Depakote today.   Recommendations  Discontinue levetiracetam Start valproic acid 1500 mg x 1 followed by 500 mg 3 times daily Continue lacosamide at 100 mg twice daily Neurology will continue to follow. ______________________________________________________________________   Thank you for the opportunity to take part in the care of this patient. If you have any further questions, please contact the neurology consultation team on call. Updated oncall schedule is listed on AMION.  Signed,  Ann Keto Neurohospitalist

## 2023-08-15 NOTE — Progress Notes (Signed)
 PHARMACY - ANTICOAGULATION CONSULT NOTE  Pharmacy Consult for heparin Indication: atrial fibrillation (on Eliquis PTA)  Allergies  Allergen Reactions   Nabumetone Other (See Comments)    Gi upset   Ace Inhibitors Cough   Flonase [Fluticasone Propionate] Other (See Comments)    Causes nosebleeds    Patient Measurements: Height: 5\' 11"  (180.3 cm) Weight: 90 kg (198 lb 6.6 oz) IBW/kg (Calculated) : 75.3 HEPARIN DW (KG): 90  Vital Signs: Temp: 99.3 F (37.4 C) (04/12 0800) Temp Source: Axillary (04/12 0402) BP: 129/89 (04/12 0800) Pulse Rate: 87 (04/12 0800)  Labs: Recent Labs    08/12/23 1037 08/12/23 1044 08/13/23 0406 08/13/23 0408 08/13/23 1403 08/14/23 0541 08/14/23 1003 08/15/23 0556  HGB 15.7   < > 14.6  --   --   --  12.8* 11.8*  HCT 48.0   < > 43.8  --   --   --  37.4* 35.7*  PLT 246  --  208  --   --   --  158 131*  APTT 38*  --  87*  --  75* 68*  --  43*  LABPROT 16.0*  --   --   --   --   --   --   --   INR 1.3*  --   --   --   --   --   --   --   HEPARINUNFRC  --   --   --  >1.10*  --  0.95*  --  0.22*  CREATININE 1.50*   < > 1.18  --   --  1.08  --  0.87  CKTOTAL 3,367*  --   --   --   --   --   --   --    < > = values in this interval not displayed.    Estimated Creatinine Clearance: 61.3 mL/min (by C-G formula based on SCr of 0.87 mg/dL).  Assessment: 68 YOM presenting with acute encephalopathy and concern for sepsis of unknown source of infection. Patient has a PMH significant for atrial fibrillation on Eliquis PTA, last dose given 4/8 PM.   Lumbar puncture now complete, will plan to restart heparin. Patient not a candidate to return to oral apixaban at this time due to NPO status. Patient was recently therapeutic on 1200 units/ hr, will start at this dose. No bolus per MD.   No signs/symptoms of bleeding reported.  CBC within normal limits.   Goal of Therapy:  Heparin level 0.3-0.7 units/ml aPTT 66-102 seconds Monitor platelets by  anticoagulation protocol: Yes   Plan:  Restart heparin at 1200 units/hr Monitor anti-Xa in 8 hours and daily  Monitor CBC and s/sx of bleeding daily F/u when to restart Eliquis  Alizeh Madril K. Arian Murley, PharmD, BCPS PGY2 Pharmacy Resident

## 2023-08-15 NOTE — Procedures (Addendum)
 Patient Name: Steven Meadows  MRN: [id number]  Epilepsy Attending: Arleene Lack  Referring Physician/Provider: Augustin Leber, MD  Duration: 08/14/2023 1234 to 08/15/2023 0738   Patient history: a 89 y.o. male with a history of previous stroke, encephalitis at age 37, atrial fibrillation on Eliquis, recent decline who presented on 08/12/2023 with decreased responsiveness.  EEG to evaluate for seizure   Level of alertness: awake, asleep   AEDs during EEG study: LEV, LCM   Technical aspects: This EEG study was done with scalp electrodes positioned according to the 10-20 International system of electrode placement. Electrical activity was reviewed with band pass filter of 1-70Hz , sensitivity of 7 uV/mm, display speed of 80mm/sec with a 60Hz  notched filter applied as appropriate. EEG data were recorded continuously and digitally stored.  Video monitoring was available and reviewed as appropriate.   Description: EEG showed continuous generalized 3 to 6 Hz theta-delta slowing. Sharp waves were noted in right posterior quadrant, qasi periodic at 0.25 to 0.5Hz . Sleep was characterized by vertex waves, sleep spindles (12 to 14 Hz), maximal frontocentral region. Hyperventilation and photic stimulation were not performed.      ABNORMALITY - Sharp waves, right posterior quadrant - Continuous slow, generalized   IMPRESSION: This study showed evidence of epileptogenicity arising from right posterior quadrant with increased risk of seizure recurrence. Additionally there is moderate diffuse encephalopathy. No definite seizures were noted.    [name]

## 2023-08-15 NOTE — Progress Notes (Signed)
 VASCULAR LAB    Bilateral upper extremity venous duplex has been performed.  See CV proc for preliminary results.   Manju Kulkarni, RVT 08/15/2023, 4:30 PM

## 2023-08-16 ENCOUNTER — Inpatient Hospital Stay (HOSPITAL_COMMUNITY)

## 2023-08-16 DIAGNOSIS — G9340 Encephalopathy, unspecified: Secondary | ICD-10-CM | POA: Diagnosis not present

## 2023-08-16 DIAGNOSIS — R509 Fever, unspecified: Secondary | ICD-10-CM | POA: Diagnosis not present

## 2023-08-16 DIAGNOSIS — R569 Unspecified convulsions: Secondary | ICD-10-CM | POA: Diagnosis not present

## 2023-08-16 DIAGNOSIS — R29818 Other symptoms and signs involving the nervous system: Secondary | ICD-10-CM | POA: Diagnosis not present

## 2023-08-16 DIAGNOSIS — R4182 Altered mental status, unspecified: Secondary | ICD-10-CM | POA: Diagnosis not present

## 2023-08-16 LAB — COMPREHENSIVE METABOLIC PANEL WITH GFR
ALT: 60 U/L — ABNORMAL HIGH (ref 0–44)
AST: 200 U/L — ABNORMAL HIGH (ref 15–41)
Albumin: 2.3 g/dL — ABNORMAL LOW (ref 3.5–5.0)
Alkaline Phosphatase: 19 U/L — ABNORMAL LOW (ref 38–126)
Anion gap: 9 (ref 5–15)
BUN: 11 mg/dL (ref 8–23)
CO2: 25 mmol/L (ref 22–32)
Calcium: 7.8 mg/dL — ABNORMAL LOW (ref 8.9–10.3)
Chloride: 100 mmol/L (ref 98–111)
Creatinine, Ser: 0.84 mg/dL (ref 0.61–1.24)
GFR, Estimated: >60 mL/min (ref >60)
Glucose, Bld: 105 mg/dL — ABNORMAL HIGH (ref 70–99)
Potassium: 2.9 mmol/L — ABNORMAL LOW (ref 3.5–5.1)
Sodium: 134 mmol/L — ABNORMAL LOW (ref 135–145)
Total Bilirubin: 0.8 mg/dL (ref 0.0–1.2)
Total Protein: 5.1 g/dL — ABNORMAL LOW (ref 6.5–8.1)

## 2023-08-16 LAB — CBC WITH DIFFERENTIAL/PLATELET
Abs Immature Granulocytes: 0.11 10*3/uL — ABNORMAL HIGH (ref 0.00–0.07)
Basophils Absolute: 0 10*3/uL (ref 0.0–0.1)
Basophils Relative: 0 %
Eosinophils Absolute: 0.1 10*3/uL (ref 0.0–0.5)
Eosinophils Relative: 2 %
HCT: 36.6 % — ABNORMAL LOW (ref 39.0–52.0)
Hemoglobin: 12.1 g/dL — ABNORMAL LOW (ref 13.0–17.0)
Immature Granulocytes: 1 %
Lymphocytes Relative: 13 %
Lymphs Abs: 1.2 10*3/uL (ref 0.7–4.0)
MCH: 28.8 pg (ref 26.0–34.0)
MCHC: 33.1 g/dL (ref 30.0–36.0)
MCV: 87.1 fL (ref 80.0–100.0)
Monocytes Absolute: 1.5 10*3/uL — ABNORMAL HIGH (ref 0.1–1.0)
Monocytes Relative: 17 %
Neutro Abs: 6 10*3/uL (ref 1.7–7.7)
Neutrophils Relative %: 67 %
Platelets: 143 10*3/uL — ABNORMAL LOW (ref 150–400)
RBC: 4.2 MIL/uL — ABNORMAL LOW (ref 4.22–5.81)
RDW: 13.6 % (ref 11.5–15.5)
WBC: 9 10*3/uL (ref 4.0–10.5)
nRBC: 0 % (ref 0.0–0.2)

## 2023-08-16 LAB — PROCALCITONIN: Procalcitonin: 0.12 ng/mL

## 2023-08-16 LAB — HEPARIN LEVEL (UNFRACTIONATED)
Heparin Unfractionated: 0.42 [IU]/mL (ref 0.30–0.70)
Heparin Unfractionated: 0.37 [IU]/mL (ref 0.30–0.70)

## 2023-08-16 LAB — HEPATITIS PANEL, ACUTE
HCV Ab: NONREACTIVE
Hep A IgM: NONREACTIVE
Hep B C IgM: NONREACTIVE
Hepatitis B Surface Ag: NONREACTIVE

## 2023-08-16 LAB — BRAIN NATRIURETIC PEPTIDE: B Natriuretic Peptide: 473.2 pg/mL — ABNORMAL HIGH (ref 0.0–100.0)

## 2023-08-16 LAB — PHOSPHORUS: Phosphorus: 1.9 mg/dL — ABNORMAL LOW (ref 2.5–4.6)

## 2023-08-16 LAB — MAGNESIUM: Magnesium: 1.6 mg/dL — ABNORMAL LOW (ref 1.7–2.4)

## 2023-08-16 LAB — C-REACTIVE PROTEIN: CRP: 6.9 mg/dL — ABNORMAL HIGH (ref <1.0)

## 2023-08-16 MED ORDER — POTASSIUM CHLORIDE 10 MEQ/100ML IV SOLN
10.0000 meq | INTRAVENOUS | Status: AC
  Administered 2023-08-16 (×6): 10 meq via INTRAVENOUS
  Filled 2023-08-16 (×5): qty 100

## 2023-08-16 MED ORDER — MAGNESIUM SULFATE 4 GM/100ML IV SOLN
4.0000 g | Freq: Once | INTRAVENOUS | Status: AC
Start: 2023-08-16 — End: 2023-08-16
  Administered 2023-08-16: 4 g via INTRAVENOUS
  Filled 2023-08-16: qty 100

## 2023-08-16 MED ORDER — SODIUM PHOSPHATES 45 MMOLE/15ML IV SOLN
30.0000 mmol | Freq: Once | INTRAVENOUS | Status: AC
  Administered 2023-08-16: 30 mmol via INTRAVENOUS
  Filled 2023-08-16: qty 10

## 2023-08-16 MED ORDER — DEXTROSE-SODIUM CHLORIDE 5-0.45 % IV SOLN: INTRAVENOUS | Status: DC

## 2023-08-16 MED ORDER — SODIUM CHLORIDE 0.9 % IV SOLN
200.0000 mg | Freq: Two times a day (BID) | INTRAVENOUS | Status: DC
  Administered 2023-08-16 – 2023-08-21 (×10): 200 mg via INTRAVENOUS
  Filled 2023-08-16 (×11): qty 20

## 2023-08-16 MED ORDER — POTASSIUM CHLORIDE 10 MEQ/100ML IV SOLN
10.0000 meq | INTRAVENOUS | Status: AC
Start: 2023-08-16 — End: 2023-08-16
  Administered 2023-08-16 (×3): 10 meq via INTRAVENOUS
  Filled 2023-08-16 (×3): qty 100

## 2023-08-16 NOTE — Progress Notes (Signed)
 PHARMACY - ANTICOAGULATION CONSULT NOTE  Pharmacy Consult for heparin Indication: atrial fibrillation (on Eliquis PTA)  Allergies  Allergen Reactions   Nabumetone Other (See Comments)    Gi upset   Ace Inhibitors Cough   Flonase [Fluticasone Propionate] Other (See Comments)    Causes nosebleeds    Patient Measurements: Height: 5\' 11"  (180.3 cm) Weight: 90 kg (198 lb 6.6 oz) IBW/kg (Calculated) : 75.3 HEPARIN DW (KG): 90   Vital Signs: Temp: 98.2 F (36.8 C) (04/12 2315) Temp Source: Axillary (04/12 2315) BP: 140/86 (04/13 0257) Pulse Rate: 91 (04/13 0257)  Labs: Recent Labs    08/13/23 1403 08/14/23 0541 08/14/23 0541 08/14/23 1003 08/15/23 0556 08/15/23 1855 08/16/23 0320  HGB  --   --    < > 12.8* 11.8*  --  12.1*  HCT  --   --   --  37.4* 35.7*  --  36.6*  PLT  --   --   --  158 131*  --  143*  APTT 75* 68*  --   --  43*  --   --   HEPARINUNFRC  --  0.95*   < >  --  0.22* 0.17* 0.42  CREATININE  --  1.08  --   --  0.87  --   --    < > = values in this interval not displayed.    Estimated Creatinine Clearance: 61.3 mL/min (by C-G formula based on SCr of 0.87 mg/dL).  Assessment: 30 YOM presenting with acute encephalopathy and concern for sepsis of unknown source of infection. Patient has a PMH significant for atrial fibrillation on Eliquis PTA, last dose given 4/8 PM.   He is s/p lumbar puncture and heparin was restarted today (not a candidate to return to oral apixaban at this time due to NPO status). Patient was recently therapeutic on 1200 units/ hr  AM: heparin level 0.42 on 1400 units/hr. No signs/symptoms of bleeding reported and CBC low, stable.   Goal of Therapy:  Heparin level 0.3-0.7 units/ml aPTT 66-102 seconds Monitor platelets by anticoagulation protocol: Yes   Plan:  -Continue heparin at 1400 units/hr -8h confirmatory heparin level -heparin level and CBC daily  Young Hensen, PharmD. Clinical Pharmacist 08/16/2023 4:25 AM

## 2023-08-16 NOTE — Evaluation (Signed)
 Clinical/Bedside Swallow Evaluation Patient Details  Name: Steven Meadows MRN: [id number] Date of Birth: 05/20/1931  Today's Date: 08/16/2023 Time: SLP Start Time (ACUTE ONLY): 1122 SLP Stop Time (ACUTE ONLY): 1136 SLP Time Calculation (min) (ACUTE ONLY): 14 min  Past Medical History:  Past Medical History:  Diagnosis Date   Abdominal aortic atherosclerosis (HCC)    Actinic keratoses    AKI (acute kidney injury) (HCC)    Aneurysm of vertebral artery (HCC)    Atrial fibrillation (HCC)    Cerebrovascular accident (CVA) due to embolism of right posterior cerebral artery (HCC) 07/25/2015   Cough    History of mononucleosis    HTN (hypertension)    Hypercholesteremia    Lacunar stroke (HCC)    Low back pain    Olecranon bursitis    Rotator cuff tear    Stroke [facility])    Stroke with cerebral ischemia (HCC)    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 male presented to hospital after found to be unresponsive at his assisted living facility.  MRI 4/9: "On further review of the images there is subtle diffusion signal  abnormality in the right hippocampus with areas of restricted  diffusion noted within the body and tail of the hippocampus.  Additional subtle diffusion signal abnormality and restricted  diffusion involving the right dorsal thalamus. Findings are  concerning for acute to early subacute infarcts. Additional subtle  diffusion signal abnormality in the right temporal lobe without  definite restricted diffusion which may be artifactual versus  additional region of infarct." Steven reports long period of seizure activity. CXR 4/12 without acute findings.  Pt with past medical history significant of hypertension, hyperlipidemia, atrial fibrillation, stroke, intracerebral hemorrhage, renal disease.     Assessment / Plan / Recommendation  Clinical Impression  Pt presents with clinical indicators of pharyngeal dysphagia.  Steven Meadows was at bedside for evaluation. She reports pt has been difficult to rouse. He intermittently attempted to follow some directions today.  He was noted to fall asleep several times during assessment, but could be roused.  SLP provided oral care prior to administration of PO trials. Pt with oral manipulation of ice chip but no true mastication.  Piecemeal deglutition noted.  Pt required multiple swallows per bolus with ice chip and thin liquid.  There was reflexive cough on 3rd trial of thin liquid by spoon, seemingly 2/2 inattention/drowsiness.  Pt's LOA is prohibitive to a PO diet at this time, but pt may have ice chips and small amounts of water by spoon when alert.  SLP will continue to follow for PO readiness.  Recommend pt remain NPO with alternate means of nutrition, hydration, and medication. Pt may have ice chips in moderation, after good oral care, when fully awake/alert, with upright positioning and direct supervision.  SLP Visit Diagnosis: Dysphagia, unspecified (R13.10)    Aspiration Risk  Mild aspiration risk    Diet Recommendation NPO         Other  Recommendations Oral Care Recommendations: Oral care prior to ice chip/H20;Oral care QID    Recommendations for follow up therapy are one component of a multi-disciplinary discharge planning process, led by the attending physician.  Recommendations may be updated based on patient status, additional functional criteria and insurance authorization.  Follow up Recommendations  (ST at  next level of care)      Assistance Recommended at Discharge  N/A  Functional Status Assessment Patient has had a recent decline in their functional status and demonstrates the ability to make significant improvements in function in a reasonable and predictable amount of time.  Frequency and Duration min 2x/week  2 weeks        Prognosis Prognosis for improved oropharyngeal function: Good      Swallow Study   General Date of Onset: 08/12/23 HPI: Steven Meadows is an 89 y.o. male presented to hospital after found to be unresponsive at his assisted living facility.  MRI 4/9: "On further review of the images there is subtle diffusion signal  abnormality in the right hippocampus with areas of restricted  diffusion noted within the body and tail of the hippocampus.  Additional subtle diffusion signal abnormality and restricted  diffusion involving the right dorsal thalamus. Findings are  concerning for acute to early subacute infarcts. Additional subtle  diffusion signal abnormality in the right temporal lobe without  definite restricted diffusion which may be artifactual versus  additional region of infarct." Steven reports long period of seizure activity. CXR 4/12 without acute findings.  Pt with past medical history significant of hypertension, hyperlipidemia, atrial fibrillation, stroke, intracerebral hemorrhage, renal disease. Type of Study: Bedside Swallow Evaluation Previous Swallow Assessment: None, seen by this service for cognition previously Diet Prior to this Study: NPO Respiratory Status: Nasal cannula History of Recent Intubation: No Behavior/Cognition: Lethargic/Drowsy;Requires cueing Oral Cavity Assessment: Dry Oral Care Completed by SLP: Yes Oral Cavity - Dentition: Adequate natural dentition Self-Feeding Abilities: Total assist Patient Positioning: Upright in bed Baseline Vocal Quality: Not observed Volitional Cough: Cognitively unable to elicit Volitional Swallow: Unable to elicit    Oral/Motor/Sensory Function Overall Oral Motor/Sensory Function: Mild impairment Lingual ROM: Reduced right Lingual Strength: Reduced   Ice Chips Ice chips: Impaired Oral Phase Functional Implications: Oral holding Pharyngeal Phase Impairments: Multiple swallows   Thin Liquid Thin Liquid: Impaired Presentation:  Spoon Pharyngeal  Phase Impairments: Multiple swallows;Cough - Immediate    Nectar Thick Nectar Thick Liquid: Not tested   Honey Thick Honey Thick Liquid: Not tested   Puree Puree: Not tested   Solid     Solid: Not tested      Elester Grim, MA, CCC-SLP Acute Rehabilitation Services Office: [phone] 08/16/2023,11:48 AM

## 2023-08-16 NOTE — Plan of Care (Signed)
  Problem: Education: Goal: Knowledge of General Education information will improve Description: Including pain rating scale, medication(s)/side effects and non-pharmacologic comfort measures Outcome: Progressing   Problem: Clinical Measurements: Goal: Ability to maintain clinical measurements within normal limits will improve Outcome: Progressing Goal: Diagnostic test results will improve Outcome: Progressing   Problem: Activity: Goal: Risk for activity intolerance will decrease Outcome: Progressing   Problem: Nutrition: Goal: Adequate nutrition will be maintained Outcome: Progressing   Problem: Elimination: Goal: Will not experience complications related to bowel motility Outcome: Progressing   Problem: Safety: Goal: Ability to remain free from injury will improve Outcome: Progressing   Problem: Skin Integrity: Goal: Risk for impaired skin integrity will decrease Outcome: Progressing

## 2023-08-16 NOTE — Progress Notes (Signed)
 NEUROLOGY CONSULT FOLLOW UP NOTE   Date of service: August 16, 2023 Patient Name: TATSUYA OKRAY MRN:  [id number] DOB:  1932/05/03  Brief HPI  MARQUIZE SEIB is a 89 y.o. male with a history of previous stroke, encephalitis at age 18, atrial fibrillation on Eliquis, recent decline who presented on 08/12/2023 with decreased responsiveness.  On my assessment initially, he was improving and no longer unresponsive, but he did have a right gaze preference, and it apparent left neglect as well.  MRI and CTA have been negative on 4/9.  Routine EEG on 4/9 was negative.  He has an elevated white count and elevated fever to 102.2.  He was started on empiric CNS coverage, but LP on 4/11 was negative.  EEG on 4/9 was initially negative, but repeat continuous EEG from 4/10 to 4/11 revealed multiple seizures prior to being loaded with antiepileptics with no seizures following load, but continued periodic discharges.  This improved over the course of the study.  He had no seizures through 1045pm when he was disconnected.   Interval Hx/subjective   He continues to be encephalopathic, LFTs are increased  Vitals   Vitals:   08/16/23 0846 08/16/23 1000 08/16/23 1200 08/16/23 1600  BP:  131/73 (!) 122/92 137/80  Pulse: 94 95 85 86  Resp: 13 (!) 24 16 (!) 25  Temp: (!) 97 F (36.1 C) 98 F (36.7 C)  98 F (36.7 C)  TempSrc:  Axillary  Axillary  SpO2:  99% 98% 99%  Weight:      Height:         Body mass index is 27.67 kg/m.  Physical Exam   General, in bed, NAD  Neurologic Examination   Gen: in bed, NAD  Neuro: MS: He is somnolent but arousable able to answer simple questions and follow commands, but very lethargic CN: He has a right gaze preference, he does not cross midline to the left but he continues to have a very significant right gaze preference. Motor: He has a left hemiparesis but does withdraw some to noxious stimulation of the left Sensory: He reports sensation bilaterally, though his  responses are unreliable  Labs and Diagnostic Imaging   CBC:  Recent Labs  Lab 08/15/23 0556 08/16/23 0320  WBC 9.9 9.0  NEUTROABS 6.7 6.0  HGB 11.8* 12.1*  HCT 35.7* 36.6*  MCV 85.6 87.1  PLT 131* 143*    Basic Metabolic Panel:  Lab Results  Component Value Date   NA 134 (L) 08/16/2023   K 2.9 (L) 08/16/2023   CO2 25 08/16/2023   GLUCOSE 105 (H) 08/16/2023   BUN 11 08/16/2023   CREATININE 0.84 08/16/2023   CALCIUM 7.8 (L) 08/16/2023   GFRNONAA >60 08/16/2023   GFRAA 61 08/18/2019   Lipid Panel:  Lab Results  Component Value Date   LDLCALC 98 12/08/2020   HgbA1c:  Lab Results  Component Value Date   HGBA1C 6.0 (H) 12/08/2020   Urine Drug Screen:     Component Value Date/Time   LABOPIA NONE DETECTED 08/12/2023 1300   COCAINSCRNUR NONE DETECTED 08/12/2023 1300   LABBENZ NONE DETECTED 08/12/2023 1300   AMPHETMU NONE DETECTED 08/12/2023 1300   THCU NONE DETECTED 08/12/2023 1300   LABBARB NONE DETECTED 08/12/2023 1300     Imaging(Personally reviewed): MRI-he has subtle diffusion change in the left medial temporal lobe CTA is negative   Impression   ANTONINO NIENHUIS is a 89 y.o. male presenting with fever, altered mental status, focal  neurological deficits.    He was found to have recurrent focal right hemispheric seizures.  On MRI, there is some subtle restricted diffusion change in the right hemisphere, I suspect that this is more likely related to seizure effect than stroke.  In any case, secondary prevention would consist of anticoagulation which she is already getting.  He has continued to be somnolent despite discontinuing Keppra.  Depakote was started, but with increasing LFTs, I think this is probably not his best choice.  I would favor increasing lacosamide and decreasing Depakote.  Recommendations  Increase lacosamide to 200 mg twice daily Discontinue Depakote Repeat MRI to assess for improvement in seizure edema versus stroke Neurology will  continue to follow. ______________________________________________________________________   Thank you for the opportunity to take part in the care of this patient. If you have any further questions, please contact the neurology consultation team on call. Updated oncall schedule is listed on AMION.  Signed,  Ann Keto Neurohospitalist

## 2023-08-16 NOTE — Evaluation (Addendum)
 Occupational Therapy Evaluation Patient Details Name: Steven Meadows MRN: [id number] DOB: February 07, 1932 Today's Date: 08/16/2023   History of Present Illness   Steven Meadows is a 89 y.o. male admitted 08/12/23 for acute encephalopathy. Pt presented after found unresponsive at his assisted living facility and was noted to be hypoxic. EEG showed five seizures without clinical signs arising from right hemisphere lasting about 2 minutes each, epileptogenicity arising from right hemisphere, maximal right posterior quadrant, and moderate diffuse encephalopathy. This EEG pattern is on the ictal-interictal continuum with high risk of seizure recurrence. MRI brain demonstrated subtle diffusion signal abnormality in right hippocampus and right dorsal thalamus. Findings are concerning for acute to early subacute infarcts. PMH significant for HTN, HLD, atrial fibrillation, CVA, intracerebral hemorrhage, renal disease, and encephalitis at age 79.     Clinical Impressions PTA, pt recently transitioned to [facility], typically ambulatory short distances with walker but limited by neuropathy. Pt recently receiving increased assist for ADL mgmt but able to manage feeding, grooming and some toileting aspects without assist. Pt presents now lethargic but does consistently open eyes when name called and when cued to open eyes. Questionable L visual field impairment- to be further assessed. Pt with impaired motor planning for BUE commands requiring Total A for ADL completion bed level but PROM overall WFL. Family at bedside, able to provide PLOF details. Educated family re: PROM of BUE, elevation, stimulation and engagement in basic command following as able. Noted ongoing GOC discussions; based on current presentation, would recommend continued inpatient follow up therapy, <3 hours/day. Will continue to follow and monitor acutely.      If plan is discharge home, recommend the following:   A lot of help with walking  and/or transfers;Two people to help with walking and/or transfers;A lot of help with bathing/dressing/bathroom;Two people to help with bathing/dressing/bathroom     Functional Status Assessment   Patient has had a recent decline in their functional status and demonstrates the ability to make significant improvements in function in a reasonable and predictable amount of time.     Equipment Recommendations   Hospital bed     Recommendations for Other Services         Precautions/Restrictions   Precautions Precautions: Fall Recall of Precautions/Restrictions: Impaired Restrictions Weight Bearing Restrictions Per Provider Order: No     Mobility Bed Mobility                    Transfers                          Balance                                           ADL either performed or assessed with clinical judgement   ADL Overall ADL's : Needs assistance/impaired Eating/Feeding: NPO   Grooming: Total assistance;Bed level                                 General ADL Comments: Total A for all ADLs at this time due to lethargy. Educated family present re: PROM of BUE, elevation to decrease swelling, contracture prevention, stimulation/engagement during the day in efforts to improve sleep/wake cycle     Vision Baseline Vision/History: 1 Wears glasses Ability to See in Adequate Light:  0 Adequate Patient Visual Report: No change from baseline Vision Assessment?: Vision impaired- to be further tested in functional context Additional Comments: unable to assess due to lethargy. question some L visual field inattention     Perception         Praxis         Pertinent Vitals/Pain Pain Assessment Pain Assessment: Faces Faces Pain Scale: No hurt Pain Intervention(s): Monitored during session     Extremity/Trunk Assessment Upper Extremity Assessment Upper Extremity Assessment: Difficult to assess due to impaired  cognition;Generalized weakness;Right hand dominant (PROM WFL)   Lower Extremity Assessment Lower Extremity Assessment: Defer to PT evaluation   Cervical / Trunk Assessment Cervical / Trunk Assessment: Normal   Communication Communication Communication: Impaired Factors Affecting Communication: Difficulty expressing self;Reduced clarity of speech;Hearing impaired   Cognition Arousal: Obtunded Behavior During Therapy: Flat affect Cognition: Difficult to assess Difficult to assess due to: Level of arousal           OT - Cognition Comments: sleeping when not stimulated. with calling name, pt opens eyes consistently and will also open eyes when directed to though does not sustain them open. with family asking pt about growing roses/keeping beetles off of plants, pt did respond with acknowledgement.                 Following commands: Impaired Following commands impaired: Follows one step commands inconsistently, Follows one step commands with increased time     Cueing  General Comments   Cueing Techniques: Verbal cues;Tactile cues  daughter, son and wife present.   Exercises Exercises: Other exercises Other Exercises Other Exercises: PROM BUE at all joints   Shoulder Instructions      Home Living Family/patient expects to be discharged to:: Assisted living                             Home Equipment: Rollator (4 wheels);Wheelchair - manual          Prior Functioning/Environment Prior Level of Function : Needs assist             Mobility Comments: can walk short distances with Rollator, also uses WC. Assist to get to dining room and for bed mobility in the morning. ADLs Comments: assist for bathing (1x/wk) and dressing. Set up for grooming/eating. ALF manages meds    OT Problem List: Decreased strength;Decreased activity tolerance;Impaired balance (sitting and/or standing);Impaired vision/perception;Decreased coordination;Decreased  cognition;Decreased safety awareness;Decreased knowledge of precautions   OT Treatment/Interventions: Therapeutic exercise;Self-care/ADL training;Energy conservation;DME and/or AE instruction;Therapeutic activities;Patient/family education;Balance training      OT Goals(Current goals can be found in the care plan section)   Acute Rehab OT Goals Patient Stated Goal: hopeful for improvements in alertness, interested in learning more about seizure prevention OT Goal Formulation: With family Time For Goal Achievement: 08/30/23 Potential to Achieve Goals: Good   OT Frequency:  Min 1X/week    Co-evaluation              AM-PAC OT "6 Clicks" Daily Activity     Outcome Measure Help from another person eating meals?: Total Help from another person taking care of personal grooming?: Total Help from another person toileting, which includes using toliet, bedpan, or urinal?: Total Help from another person bathing (including washing, rinsing, drying)?: Total Help from another person to put on and taking off regular upper body clothing?: Total Help from another person to put on and taking off regular lower body  clothing?: Total 6 Click Score: 6   End of Session Nurse Communication: Mobility status  Activity Tolerance: Patient limited by lethargy Patient left: in bed;with call bell/phone within reach;with family/visitor present  OT Visit Diagnosis: Other abnormalities of gait and mobility (R26.89);Unsteadiness on feet (R26.81);Muscle weakness (generalized) (M62.81);Other symptoms and signs involving cognitive function                Time: 1201-1220 OT Time Calculation (min): 19 min Charges:  OT General Charges $OT Visit: 1 Visit OT Evaluation $OT Eval Low Complexity: 1 Low  Lawrence Pretty, OTR/L Acute Rehab Services Office: [phone]   Annabella Barr 08/16/2023, 12:33 PM

## 2023-08-16 NOTE — Progress Notes (Signed)
 PHARMACY - ANTICOAGULATION CONSULT NOTE  Pharmacy Consult for heparin Indication: atrial fibrillation (on Eliquis PTA)  Allergies  Allergen Reactions   Nabumetone Other (See Comments)    Gi upset   Ace Inhibitors Cough   Flonase [Fluticasone Propionate] Other (See Comments)    Causes nosebleeds    Patient Measurements: Height: 5\' 11"  (180.3 cm) Weight: 90 kg (198 lb 6.6 oz) IBW/kg (Calculated) : 75.3 HEPARIN DW (KG): 90   Vital Signs: Temp: 98 F (36.7 C) (04/13 1000) Temp Source: Axillary (04/13 1000) BP: 122/92 (04/13 1200) Pulse Rate: 85 (04/13 1200)  Labs: Recent Labs    08/13/23 1403 08/14/23 0541 08/14/23 0541 08/14/23 1003 08/15/23 0556 08/15/23 1855 08/16/23 0320  HGB  --   --    < > 12.8* 11.8*  --  12.1*  HCT  --   --   --  37.4* 35.7*  --  36.6*  PLT  --   --   --  158 131*  --  143*  APTT 75* 68*  --   --  43*  --   --   HEPARINUNFRC  --  0.95*   < >  --  0.22* 0.17* 0.42  CREATININE  --  1.08  --   --  0.87  --  0.84   < > = values in this interval not displayed.    Estimated Creatinine Clearance: 63.5 mL/min (by C-G formula based on SCr of 0.84 mg/dL).  Assessment: 14 YOM presenting with acute encephalopathy and concern for sepsis of unknown source of infection. Patient has a PMH significant for atrial fibrillation on Eliquis PTA, last dose given 4/8 PM.   He is s/p lumbar puncture and heparin was restarted today (not a candidate to return to oral apixaban at this time due to NPO status). Patient was recently therapeutic on 1200 units/ hr  4/13 PM: heparin level therapeutic at 0.37 on 1400 units/hr. No signs/symptoms of bleeding reported and CBC stable.  Goal of Therapy:  Heparin level 0.3-0.7 units/ml aPTT 66-102 seconds Monitor platelets by anticoagulation protocol: Yes   Plan:  -Continue heparin at 1400 units/hr -Monitor for signs and symptoms of bleeding  -Follow up anticoagulation plan when able to tolerate PO  -Measure heparin level  and CBC daily  Leonora Ramus, PharmD, BCPS PGY2 Pharmacy Resident

## 2023-08-16 NOTE — Progress Notes (Addendum)
 PROGRESS NOTE                                                                                                                                                                                                             Patient Demographics:    Steven Meadows, is a 89 y.o. male, DOB - 07-26-1931, WUJ:[id number]  Outpatient Primary MD for the patient is Pcp, No    LOS - 4  Admit date - 08/12/2023    No chief complaint on file.      Brief Narrative (HPI from H&P)   89 y.o. male with past medical history significant of hypertension, hyperlipidemia, atrial fibrillation, stroke, intracerebral hemorrhage, renal disease presented to hospital after found to be unresponsive at his assisted living facility.  He had been having progressive weakness recently and was found facedown on the floor.  Her workup in the ER was suggestive of possible acute and subacute stroke, ongoing seizures, suspicious for meningitis/encephalitis and he was admitted to the hospital, neurology was consulted.   Subjective:    Steven Meadows today in bed, nearly obtunded unable to provide any review of systems   Assessment  & Plan :   Acute metabolic encephalopathy with sepsis present on admission.   Recently moved to assisted living facility where he was exposed by a sick contact diagnosed with herpes, subsequently found unresponsive by staff at assisted living facility.  Workup in the hospital has so far revealed acute to subacute strokes on MRI, ongoing seizures on EEG suspicious for status epilepticus, leukocytosis, near obtunded state.  Case discussed with neurology in detail on 08/15/2023, LP showed CSF not consistent with infection, MRI changes believe more likely due to prolonged seizure versus stroke.  Currently on AEDs, mentation gradually improving, de-escalated antibiotics on 08/15/2023.  Family will decide on 08/17/2023 if they want a temporary NG tube versus  transition to comfort care if no improvement in the next 2 to 3 days.   Sepsis clinically ruled out, leukocytosis and fever likely reactionary from status epilepticus, de-escalate antibiotics to Unasyn, accounting for mild clinical aspiration while he was seizing.  Follow cultures.  1 out of 2 blood cultures with staph epidermis contamination.  Respiratory viral panel negative.  History of CVA  MRI noted, acute to subacute strokes, echocardiogram stable, continue heparin drip for underlying A-fib, PT OT and speech  eval, continue combination of Crestor and Zetia.   History of atrial fibrillation.  Has been started on heparin drip.  Was on Eliquis at home.  Currently on hold.  Might need lumbar puncture.  As needed Cardizem.   Hypertension as needed Cardizem.   Hypokalemia.  Replaced.    Hyperlipidemia  -Home Zetia continue at home dose, Crestor dose will be increased once he is taking oral on hold.  LDL was above goal.  Mild bilateral upper extremity swelling.  Upper extremity venous duplex bilateral negative.  Keep upper extremities elevated.  Asymptomatic transaminitis.  Check right upper quadrant ultrasound, acute hepatitis panel, total bilirubin stable, could be due to transient hypotension or medication induced, will monitor.      Condition - Extremely Guarded  Family Communication  : Updated daughter in detail on 08/14/2023, family member bedside 08/15/2022, updated wife over the phone on 08/16/2023, daughter Haskell Linker on 08/16/2023  Code Status : DNR  Consults  : Neurology  PUD Prophylaxis : PPI   Procedures  :     MRI brain.   subtle diffusion signal abnormality in the right hippocampus with areas of restricted diffusion noted within the body and tail of the hippocampus. Additional subtle diffusion signal abnormality and restricted diffusion involving the right dorsal thalamus. Findings are concerning for acute to early subacute infarcts.   CTA head and neck.   CTA neck: 1. The  common carotid and internal carotid arteries are patent within the neck without hemodynamically significant stenosis. Atherosclerotic plaque about the carotid bifurcations and within the proximal internal carotid arteries, bilaterally. 2. The left vertebral artery is non-dominant and developmentally diminutive, but patent throughout the neck. 3. The dominant right vertebral artery is patent within the neck. Non-stenotic atherosclerotic plaque at the origin of this vessel. 4. Aortic Atherosclerosis (ICD10-I70.0).   CTA head: 1. No proximal intracranial large vessel occlusion identified. 2. Intracranial atherosclerotic disease with multifocal stenoses, most notably as follows. 3. Severe stenosis within a left PCA branch at the P2/P3 junction, new from prior exams. 4. Sites of up to moderate stenosis within the intracranial right vertebral artery. 5. Up to moderate stenosis of the supraclinoid right internal carotid artery. 6. 7-8 mm aneurysm at the vertebrobasilar junction on the right, unchanged in size from the CTA of 04/30/2020  EEG.  Positive for seizures.    Echocardiogram.  1. Left ventricular ejection fraction, by estimation, is 60 to 65%. The left ventricle has normal function. The left ventricle has no regional wall motion abnormalities. Left ventricular diastolic parameters are indeterminate.  2. Right ventricular systolic function is moderately reduced. The right ventricular size is moderately enlarged. There is normal pulmonary artery systolic pressure. The estimated right ventricular systolic pressure is 33.5 mmHg.  3. Left atrial size was severely dilated.  4. Right atrial size was severely dilated.  5. The mitral valve was not well visualized. Trivial mitral valve regurgitation. Moderate mitral annular calcification.  6. The aortic valve is tricuspid. Aortic valve regurgitation is not visualized. Aortic valve sclerosis/calcification is present, without any evidence of aortic stenosis.  7. Aortic  dilatation noted. There is mild dilatation of the ascending aorta.  8. The inferior vena cava is dilated in size with <50% respiratory variability, suggesting right atrial pressure of 15 mmHg  LP -CSF not consistent with infection.      Disposition Plan  :    Status is: Inpatient  DVT Prophylaxis  :  SCDs/Hep gtt  Lab Results  Component Value Date   PLT  143 (L) 08/16/2023    Diet :  Diet Order             Diet NPO time specified  Diet effective now                    Inpatient Medications  Scheduled Meds:  Chlorhexidine Gluconate Cloth  6 each Topical Daily   pantoprazole (PROTONIX) IV  40 mg Intravenous Q24H   sodium chloride flush  10-40 mL Intracatheter Q12H   sodium chloride flush  3 mL Intravenous Q12H   Continuous Infusions:  ampicillin-sulbactam (UNASYN) IV 3 g (08/16/23 0418)   dextrose 5 % and 0.45 % NaCl 75 mL/hr at 08/16/23 0539   heparin 1,400 Units/hr (08/15/23 1954)   lacosamide (VIMPAT) IV 100 mg (08/15/23 2240)   potassium chloride 10 mEq (08/16/23 0802)   potassium chloride     sodium PHOSPHATE IVPB (in mmol)     valproate sodium 500 mg (08/16/23 0533)   PRN Meds:.acetaminophen **OR** acetaminophen, diltiazem, LORazepam, polyethylene glycol    Objective:   Vitals:   08/15/23 2315 08/16/23 0201 08/16/23 0257 08/16/23 0418  BP: (!) 148/100 (!) 153/92 (!) 140/86 (!) 145/95  Pulse: 91 86 91 88  Resp: (!) 22 14 18 20   Temp: 98.2 F (36.8 C)   98.7 F (37.1 C)  TempSrc: Axillary   Oral  SpO2: 97% 97% 96% 97%  Weight:      Height:        Wt Readings from Last 3 Encounters:  08/13/23 90 kg  01/19/22 90.7 kg  06/10/21 98.9 kg     Intake/Output Summary (Last 24 hours) at 08/16/2023 0834 Last data filed at 08/16/2023 0547 Gross per 24 hour  Intake 6169.77 ml  Output 2500 ml  Net 3669.77 ml     Physical Exam  Somnolent, will respond to a few questions off-and-on, still extremely confused, no new F.N deficits,    [place].AT,PERRAL Supple Neck, No JVD,   Symmetrical Chest wall movement, Good air movement bilaterally, CTAB RRR,No Gallops,Rubs or new Murmurs,  +ve B.Sounds, Abd Soft, No tenderness,   No Cyanosis, Clubbing or edema      Data Review:    Recent Labs  Lab 08/12/23 1037 08/12/23 1044 08/13/23 0406 08/14/23 1003 08/15/23 0556 08/16/23 0320  WBC 21.4*  --  15.1* 12.0* 9.9 9.0  HGB 15.7 16.7 14.6 12.8* 11.8* 12.1*  HCT 48.0 49.0 43.8 37.4* 35.7* 36.6*  PLT 246  --  208 158 131* 143*  MCV 87.6  --  85.0 85.8 85.6 87.1  MCH 28.6  --  28.3 29.4 28.3 28.8  MCHC 32.7  --  33.3 34.2 33.1 33.1  RDW 13.5  --  13.8 13.9 13.7 13.6  LYMPHSABS 1.0  --   --   --  1.1 1.2  MONOABS 2.7*  --   --   --  1.9* 1.5*  EOSABS 0.0  --   --   --  0.1 0.1  BASOSABS 0.0  --   --   --  0.0 0.0    Recent Labs  Lab 08/12/23 1037 08/12/23 1044 08/12/23 1200 08/12/23 1412 08/13/23 0406 08/14/23 0541 08/14/23 0609 08/14/23 1003 08/15/23 0556 08/16/23 0320  NA 133* 132*  --   --  134* 134*  --   --  133* 134*  K 4.9 5.0  --   --  3.7 3.2*  --   --  2.9* 2.9*  CL 97* 100  --   --  99 100  --   --  99 100  CO2 18*  --   --   --  22 23  --   --  24 25  ANIONGAP 18*  --   --   --  13 11  --   --  10 9  GLUCOSE 169* 171*  --   --  150* 112*  --   --  109* 105*  BUN 15 17  --   --  14 16  --   --  12 11  CREATININE 1.50* 1.50*  --   --  1.18 1.08  --   --  0.87 0.84  AST 49*  --   --   --  266*  --   --   --  218* 200*  ALT 31  --   --   --  61*  --   --   --  57* 60*  ALKPHOS 31*  --   --   --  24*  --   --   --  19* 19*  BILITOT 0.5  --   --   --  1.0  --   --   --  0.8 0.8  ALBUMIN 4.2  --   --   --  3.2*  --   --   --  2.4* 2.3*  CRP  --   --   --   --   --   --   --  11.1* 7.2* 6.9*  PROCALCITON  --   --   --   --   --   --  0.28  --  0.18 0.12  LATICACIDVEN  --   --  3.5* 3.8*  --   --   --   --   --   --   INR 1.3*  --   --   --   --   --   --   --   --   --   BNP  --   --   --   --   --   --    --  295.7* 298.3* 473.2*  MG  --   --   --   --   --  1.8  --   --  1.7 1.6*  PHOS  --   --   --   --   --   --   --   --  2.1* 1.9*  CALCIUM 9.1  --   --   --  8.6* 8.3*  --   --  7.8* 7.8*      Recent Labs  Lab 08/12/23 1037 08/12/23 1200 08/12/23 1412 08/13/23 0406 08/14/23 0541 08/14/23 0609 08/14/23 1003 08/15/23 0556 08/16/23 0320  CRP  --   --   --   --   --   --  11.1* 7.2* 6.9*  PROCALCITON  --   --   --   --   --  0.28  --  0.18 0.12  LATICACIDVEN  --  3.5* 3.8*  --   --   --   --   --   --   INR 1.3*  --   --   --   --   --   --   --   --   BNP  --   --   --   --   --   --  295.7* 298.3* 473.2*  MG  --   --   --   --  1.8  --   --  1.7 1.6*  CALCIUM 9.1  --   --  8.6* 8.3*  --   --  7.8* 7.8*    --------------------------------------------------------------------------------------------------------------- Lab Results  Component Value Date   CHOL 132 12/08/2020   HDL 26 (L) 12/08/2020   LDLCALC 98 12/08/2020   TRIG 39 12/08/2020   CHOLHDL 5.1 12/08/2020    Lab Results  Component Value Date   HGBA1C 6.0 (H) 12/08/2020      Micro Results Recent Results (from the past 240 hours)  Culture, blood (routine x 2)     Status: None (Preliminary result)   Collection Time: 08/12/23 11:22 AM   Specimen: BLOOD LEFT HAND  Result Value Ref Range Status   Specimen Description BLOOD LEFT HAND  Final   Special Requests   Final    BOTTLES DRAWN AEROBIC AND ANAEROBIC Blood Culture results may not be optimal due to an inadequate volume of blood received in culture bottles   Culture   Final    NO GROWTH 3 DAYS Performed at [facility], [address] [place], [place] [address]    Report Status PENDING  Incomplete  Resp panel by RT-PCR (RSV, Flu A&B, Covid) Anterior Nasal Swab     Status: None   Collection Time: 08/12/23 11:26 AM   Specimen: Anterior Nasal Swab  Result Value Ref Range Status   SARS Coronavirus 2 by RT PCR NEGATIVE NEGATIVE Final   Influenza A  by PCR NEGATIVE NEGATIVE Final   Influenza B by PCR NEGATIVE NEGATIVE Final    Comment: (NOTE) The Xpert Xpress SARS-CoV-2/FLU/RSV plus assay is intended as an aid in the diagnosis of influenza from Nasopharyngeal swab specimens and should not be used as a sole basis for treatment. Nasal washings and aspirates are unacceptable for Xpert Xpress SARS-CoV-2/FLU/RSV testing.  Fact Sheet for Patients: BloggerCourse.com  Fact Sheet for Healthcare Providers: SeriousBroker.it  This test is not yet approved or cleared by the [place]  FDA and has been authorized for detection and/or diagnosis of SARS-CoV-2 by FDA under an Emergency Use Authorization (EUA). This EUA will remain in effect (meaning this test can be used) for the duration of the COVID-19 declaration under Section 564(b)(1) of the Act, 21 U.S.C. section 360bbb-3(b)(1), unless the authorization is terminated or revoked.     Resp Syncytial Virus by PCR NEGATIVE NEGATIVE Final    Comment: (NOTE) Fact Sheet for Patients: BloggerCourse.com  Fact Sheet for Healthcare Providers: SeriousBroker.it  This test is not yet approved or cleared by the [place]  FDA and has been authorized for detection and/or diagnosis of SARS-CoV-2 by FDA under an Emergency Use Authorization (EUA). This EUA will remain in effect (meaning this test can be used) for the duration of the COVID-19 declaration under Section 564(b)(1) of the Act, 21 U.S.C. section 360bbb-3(b)(1), unless the authorization is terminated or revoked.  Performed at [facility], [address] [place], [place] [address]   Culture, blood (routine x 2)     Status: Abnormal   Collection Time: 08/12/23 11:55 AM   Specimen: BLOOD LEFT ARM  Result Value Ref Range Status   Specimen Description BLOOD LEFT ARM  Final   Special Requests   Final    BOTTLES DRAWN AEROBIC AND  ANAEROBIC Blood Culture adequate volume   Culture  Setup Time   Final    GRAM POSITIVE COCCI ANAEROBIC BOTTLE ONLY CRITICAL RESULT CALLED TO, READ BACK BY AND VERIFIED WITH: PHARMD [facility]  on [phone] @0755  by SM    Culture (A)  Final    STAPHYLOCOCCUS EPIDERMIDIS THE SIGNIFICANCE OF ISOLATING THIS ORGANISM FROM A SINGLE SET OF BLOOD CULTURES WHEN MULTIPLE SETS ARE DRAWN IS UNCERTAIN. PLEASE NOTIFY THE MICROBIOLOGY DEPARTMENT WITHIN ONE WEEK IF SPECIATION AND SENSITIVITIES ARE REQUIRED. Performed at [facility], [address], [place], [place] [address]    Report Status 08/15/2023 FINAL  Final  Blood Culture ID Panel (Reflexed)     Status: Abnormal   Collection Time: 08/12/23 11:55 AM  Result Value Ref Range Status   Enterococcus faecalis NOT DETECTED NOT DETECTED Final   Enterococcus Faecium NOT DETECTED NOT DETECTED Final   Listeria monocytogenes NOT DETECTED NOT DETECTED Final   Staphylococcus species DETECTED (A) NOT DETECTED Final    Comment: CRITICAL RESULT CALLED TO, READ BACK BY AND VERIFIED WITH: PHARMD Francie Massing on [phone] @0755  by SM    Staphylococcus aureus (BCID) NOT DETECTED NOT DETECTED Final   Staphylococcus epidermidis DETECTED (A) NOT DETECTED Final    Comment: Methicillin (oxacillin) resistant coagulase negative staphylococcus. Possible blood culture contaminant (unless isolated from more than one blood culture draw or clinical case suggests pathogenicity). No antibiotic treatment is indicated for blood  culture contaminants. CRITICAL RESULT CALLED TO, READ BACK BY AND VERIFIED WITH: PHARMD Bailey A on [phone] @0755  by SM    Staphylococcus lugdunensis NOT DETECTED NOT DETECTED Final   Streptococcus species NOT DETECTED NOT DETECTED Final   Streptococcus agalactiae NOT DETECTED NOT DETECTED Final   Streptococcus pneumoniae NOT DETECTED NOT DETECTED Final   Streptococcus pyogenes NOT DETECTED NOT DETECTED Final   A.calcoaceticus-baumannii NOT DETECTED NOT  DETECTED Final   Bacteroides fragilis NOT DETECTED NOT DETECTED Final   Enterobacterales NOT DETECTED NOT DETECTED Final   Enterobacter cloacae complex NOT DETECTED NOT DETECTED Final   Escherichia coli NOT DETECTED NOT DETECTED Final   Klebsiella aerogenes NOT DETECTED NOT DETECTED Final   Klebsiella oxytoca NOT DETECTED NOT DETECTED Final   Klebsiella pneumoniae NOT DETECTED NOT DETECTED Final   Proteus species NOT DETECTED NOT DETECTED Final   Salmonella species NOT DETECTED NOT DETECTED Final   Serratia marcescens NOT DETECTED NOT DETECTED Final   Haemophilus influenzae NOT DETECTED NOT DETECTED Final   Neisseria meningitidis NOT DETECTED NOT DETECTED Final   Pseudomonas aeruginosa NOT DETECTED NOT DETECTED Final   Stenotrophomonas maltophilia NOT DETECTED NOT DETECTED Final   Candida albicans NOT DETECTED NOT DETECTED Final   Candida auris NOT DETECTED NOT DETECTED Final   Candida glabrata NOT DETECTED NOT DETECTED Final   Candida krusei NOT DETECTED NOT DETECTED Final   Candida parapsilosis NOT DETECTED NOT DETECTED Final   Candida tropicalis NOT DETECTED NOT DETECTED Final   Cryptococcus neoformans/gattii NOT DETECTED NOT DETECTED Final   Methicillin resistance mecA/C DETECTED (A) NOT DETECTED Final    Comment: CRITICAL RESULT CALLED TO, READ BACK BY AND VERIFIED WITH: PHARMD Francie Massing on [phone] @0755  by SM Performed at [facility], [address], [place], [place] [address]   MRSA Next Gen by PCR, Nasal     Status: None   Collection Time: 08/14/23  8:48 AM   Specimen: Nasal Mucosa; Nasal Swab  Result Value Ref Range Status   MRSA by PCR Next Gen NOT DETECTED NOT DETECTED Final    Comment: (NOTE) The GeneXpert MRSA Assay (FDA approved for NASAL specimens only), is one component of a comprehensive MRSA colonization surveillance program. It is not intended to diagnose MRSA  infection nor to guide or monitor treatment for MRSA infections. Test performance is not FDA  approved in patients less than 33 years old. Performed at [facility], [address], [place], [place] [address]   CSF culture w Gram Stain     Status: None (Preliminary result)   Collection Time: 08/14/23  3:57 PM   Specimen: CSF; Cerebrospinal Fluid  Result Value Ref Range Status   Specimen Description CSF  Final   Special Requests NONE  Final   Gram Stain   Final    WBC PRESENT, PREDOMINANTLY MONONUCLEAR NO ORGANISMS SEEN CYTOSPIN SMEAR    Culture   Final    NO GROWTH < 24 HOURS Performed at [facility], [address], [place], [place] [address]    Report Status PENDING  Incomplete  Culture, fungus without smear     Status: None (Preliminary result)   Collection Time: 08/14/23  3:57 PM   Specimen: CSF; Cerebrospinal Fluid  Result Value Ref Range Status   Specimen Description CSF  Final   Special Requests NONE  Final   Culture   Final    NO GROWTH < 24 HOURS Performed at [facility], [address] [place], [place] [address]    Report Status PENDING  Incomplete    Radiology Report DG CHEST PORT 1 VIEW Result Date: 08/15/2023 CLINICAL DATA:  [id number] Status post PICC central line placement [id number] EXAM: PORTABLE CHEST 1 VIEW COMPARISON:  Chest x-ray 08/14/2023 FINDINGS: Interval placement of a right PICC with tip overlying the distal superior vena cava. The heart and mediastinal contours are unchanged. Atherosclerotic plaque. No focal consolidation. No pulmonary edema. No pleural effusion. No pneumothorax. No acute osseous abnormality. IMPRESSION: 1. Right PICC with tip overlying the distal superior vena cava. 2. No acute cardiopulmonary abnormality. 3.  Aortic Atherosclerosis (ICD10-I70.0). Electronically Signed   By: Tish Frederickson M.D.   On: 08/15/2023 19:34   VAS [place] UPPER EXTREMITY VENOUS DUPLEX Result Date: 08/15/2023 UPPER VENOUS STUDY  Patient Name:  IDAN PRIME  Date of Exam:   08/15/2023 Medical Rec #: [id number]       Accession #:    [id number]  Date of Birth: 03-25-1932        Patient Gender: M Patient Age:   61 years Exam Location:  [facility] Procedure:      VAS [place] UPPER EXTREMITY VENOUS DUPLEX Referring Phys: Bess Harvest [facility] --------------------------------------------------------------------------------  Indications: Swelling. Patient found face-down and unresponsive at living facility with arms under him. CK elevated >3,000 Risk Factors: Atrial fibrillation. Anticoagulation: Eliquis outside of hospital, Heparin infusion in hospital. Limitations: Swelling, patient asleep and unable to keep arms extended and poor ultrasound/tissue interface. Comparison Study: No prior study Performing Technologist: Sherren Kerns RVS  Examination Guidelines: A complete evaluation includes B-mode imaging, spectral Doppler, color Doppler, and power Doppler as needed of all accessible portions of each vessel. Bilateral testing is considered an integral part of a complete examination. Limited examinations for reoccurring indications may be performed as noted.  Right Findings: +----------+------------+---------+-----------+----------+-------+ RIGHT     CompressiblePhasicitySpontaneousPropertiesSummary +----------+------------+---------+-----------+----------+-------+ IJV           Full       Yes       Yes                      +----------+------------+---------+-----------+----------+-------+ Subclavian    Full       Yes       Yes                      +----------+------------+---------+-----------+----------+-------+  Axillary                 Yes       Yes                      +----------+------------+---------+-----------+----------+-------+ Brachial      Full                                          +----------+------------+---------+-----------+----------+-------+ Radial        Full                                          +----------+------------+---------+-----------+----------+-------+ Ulnar         Full                                           +----------+------------+---------+-----------+----------+-------+ Cephalic      Full                                          +----------+------------+---------+-----------+----------+-------+ Basilic       Full                                          +----------+------------+---------+-----------+----------+-------+  Left Findings: +----------+------------+---------+-----------+----------+---------------+ LEFT      CompressiblePhasicitySpontaneousProperties    Summary     +----------+------------+---------+-----------+----------+---------------+ IJV           Full       Yes       Yes                              +----------+------------+---------+-----------+----------+---------------+ Subclavian    Full       Yes       Yes                              +----------+------------+---------+-----------+----------+---------------+ Axillary      Full       Yes       Yes                              +----------+------------+---------+-----------+----------+---------------+ Brachial                 Yes       Yes                              +----------+------------+---------+-----------+----------+---------------+ Radial        Full                                                  +----------+------------+---------+-----------+----------+---------------+ Ulnar  patent by color +----------+------------+---------+-----------+----------+---------------+ Basilic       Full                                                  +----------+------------+---------+-----------+----------+---------------+  Summary:  Right: No evidence of deep vein thrombosis in the upper extremity. No evidence of superficial vein thrombosis in the upper extremity.  Left: No evidence of deep vein thrombosis in the upper extremity. No evidence of superficial vein thrombosis in the upper extremity.  *See table(s)  above for measurements and observations.    Preliminary    US  EKG SITE RITE Result Date: 08/15/2023 If Site Rite image not attached, placement could not be confirmed due to current cardiac rhythm.  ECHOCARDIOGRAM COMPLETE Result Date: 08/14/2023    ECHOCARDIOGRAM REPORT   Patient Name:   ROMUALD MCCASLIN Date of Exam: 08/14/2023 Medical Rec #:  [id number]      Height:       71.0 in Accession #:    [id number]     Weight:       198.4 lb Date of Birth:  Dec 27, 1931       BSA:          2.101 m Patient Age:    89 years       BP:           138/86 mmHg Patient Gender: M              HR:           97 bpm. Exam Location:  Inpatient Procedure: 2D Echo, Cardiac Doppler and Color Doppler (Both Spectral and Color            Flow Doppler were utilized during procedure). Indications:    CHF-Acute Diastolic I50.31  History:        Patient has prior history of Echocardiogram examinations, most                 recent 12/08/2020.  Sonographer:    Hersey Lorenzo RDCS Referring Phys: Florie Husband Andrez Banker [facility] IMPRESSIONS  1. Left ventricular ejection fraction, by estimation, is 60 to 65%. The left ventricle has normal function. The left ventricle has no regional wall motion abnormalities. Left ventricular diastolic parameters are indeterminate.  2. Right ventricular systolic function is moderately reduced. The right ventricular size is moderately enlarged. There is normal pulmonary artery systolic pressure. The estimated right ventricular systolic pressure is 33.5 mmHg.  3. Left atrial size was severely dilated.  4. Right atrial size was severely dilated.  5. The mitral valve was not well visualized. Trivial mitral valve regurgitation. Moderate mitral annular calcification.  6. The aortic valve is tricuspid. Aortic valve regurgitation is not visualized. Aortic valve sclerosis/calcification is present, without any evidence of aortic stenosis.  7. Aortic dilatation noted. There is mild dilatation of the ascending aorta.  8. The inferior vena  cava is dilated in size with <50% respiratory variability, suggesting right atrial pressure of 15 mmHg. Conclusion(s)/Recommendation(s): Technically very limited study due to poor sound wave transmission. FINDINGS  Left Ventricle: Left ventricular ejection fraction, by estimation, is 60 to 65%. The left ventricle has normal function. The left ventricle has no regional wall motion abnormalities. The left ventricular internal cavity size was normal in size. There is  no left ventricular hypertrophy. Left ventricular diastolic parameters are indeterminate. Right Ventricle: The  right ventricular size is moderately enlarged. No increase in right ventricular wall thickness. Right ventricular systolic function is moderately reduced. There is normal pulmonary artery systolic pressure. The tricuspid regurgitant velocity is 2.15 m/s, and with an assumed right atrial pressure of 15 mmHg, the estimated right ventricular systolic pressure is 33.5 mmHg. Left Atrium: Left atrial size was severely dilated. Right Atrium: Right atrial size was severely dilated. Pericardium: There is no evidence of pericardial effusion. Mitral Valve: The mitral valve was not well visualized. Moderate mitral annular calcification. Trivial mitral valve regurgitation. Tricuspid Valve: The tricuspid valve is normal in structure. Tricuspid valve regurgitation is mild. Aortic Valve: The aortic valve is tricuspid. Aortic valve regurgitation is not visualized. Aortic valve sclerosis/calcification is present, without any evidence of aortic stenosis. Pulmonic Valve: The pulmonic valve was grossly normal. Pulmonic valve regurgitation is trivial. Aorta: Aortic dilatation noted. There is mild dilatation of the ascending aorta. Venous: The inferior vena cava is dilated in size with less than 50% respiratory variability, suggesting right atrial pressure of 15 mmHg. IAS/Shunts: No atrial level shunt detected by color flow Doppler.  IVC IVC diam: 2.60 cm LEFT ATRIUM            Index        RIGHT ATRIUM           Index LA Vol (A4C): 99.9 ml 47.54 ml/m  RA Area:     22.50 cm                                    RA Volume:   67.20 ml  31.98 ml/m  AORTIC VALVE LVOT Vmax:   67.10 cm/s LVOT Vmean:  50.700 cm/s LVOT VTI:    0.116 m  AORTA Ao Root diam: 4.10 cm Ao Asc diam:  4.00 cm TRICUSPID VALVE TR Peak grad:   18.5 mmHg TR Vmax:        215.00 cm/s  SHUNTS Systemic VTI: 0.12 m Jules Oar MD Electronically signed by Jules Oar MD Signature Date/Time: 08/14/2023/3:43:56 PM    Final      Signature  -   Lynnwood Sauer M.D on 08/16/2023 at 8:34 AM   -  To page go to [email or web address]

## 2023-08-17 ENCOUNTER — Inpatient Hospital Stay (HOSPITAL_COMMUNITY)

## 2023-08-17 ENCOUNTER — Encounter: Payer: Self-pay | Admitting: Internal Medicine

## 2023-08-17 DIAGNOSIS — R509 Fever, unspecified: Secondary | ICD-10-CM

## 2023-08-17 DIAGNOSIS — G9340 Encephalopathy, unspecified: Secondary | ICD-10-CM | POA: Diagnosis not present

## 2023-08-17 DIAGNOSIS — R29818 Other symptoms and signs involving the nervous system: Secondary | ICD-10-CM | POA: Diagnosis not present

## 2023-08-17 DIAGNOSIS — I4891 Unspecified atrial fibrillation: Secondary | ICD-10-CM

## 2023-08-17 DIAGNOSIS — R569 Unspecified convulsions: Secondary | ICD-10-CM | POA: Diagnosis not present

## 2023-08-17 DIAGNOSIS — R4182 Altered mental status, unspecified: Secondary | ICD-10-CM | POA: Diagnosis not present

## 2023-08-17 LAB — PHOSPHORUS: Phosphorus: 2.7 mg/dL (ref 2.5–4.6)

## 2023-08-17 LAB — CBC WITH DIFFERENTIAL/PLATELET
Abs Immature Granulocytes: 0.12 10*3/uL — ABNORMAL HIGH (ref 0.00–0.07)
Basophils Absolute: 0 10*3/uL (ref 0.0–0.1)
Basophils Relative: 1 %
Eosinophils Absolute: 0.2 10*3/uL (ref 0.0–0.5)
Eosinophils Relative: 3 %
HCT: 36.6 % — ABNORMAL LOW (ref 39.0–52.0)
Hemoglobin: 12.2 g/dL — ABNORMAL LOW (ref 13.0–17.0)
Immature Granulocytes: 1 %
Lymphocytes Relative: 16 %
Lymphs Abs: 1.3 10*3/uL (ref 0.7–4.0)
MCH: 29 pg (ref 26.0–34.0)
MCHC: 33.3 g/dL (ref 30.0–36.0)
MCV: 86.9 fL (ref 80.0–100.0)
Monocytes Absolute: 1.4 10*3/uL — ABNORMAL HIGH (ref 0.1–1.0)
Monocytes Relative: 17 %
Neutro Abs: 5.2 10*3/uL (ref 1.7–7.7)
Neutrophils Relative %: 62 %
Platelets: 165 10*3/uL (ref 150–400)
RBC: 4.21 MIL/uL — ABNORMAL LOW (ref 4.22–5.81)
RDW: 13.7 % (ref 11.5–15.5)
WBC: 8.4 10*3/uL (ref 4.0–10.5)
nRBC: 0 % (ref 0.0–0.2)

## 2023-08-17 LAB — COMPREHENSIVE METABOLIC PANEL WITH GFR
ALT: 55 U/L — ABNORMAL HIGH (ref 0–44)
AST: 174 U/L — ABNORMAL HIGH (ref 15–41)
Albumin: 2.1 g/dL — ABNORMAL LOW (ref 3.5–5.0)
Alkaline Phosphatase: 18 U/L — ABNORMAL LOW (ref 38–126)
Anion gap: 7 (ref 5–15)
BUN: 13 mg/dL (ref 8–23)
CO2: 26 mmol/L (ref 22–32)
Calcium: 7.7 mg/dL — ABNORMAL LOW (ref 8.9–10.3)
Chloride: 101 mmol/L (ref 98–111)
Creatinine, Ser: 0.83 mg/dL (ref 0.61–1.24)
GFR, Estimated: >60 mL/min (ref >60)
Glucose, Bld: 81 mg/dL (ref 70–99)
Potassium: 3.5 mmol/L (ref 3.5–5.1)
Sodium: 134 mmol/L — ABNORMAL LOW (ref 135–145)
Total Bilirubin: 0.7 mg/dL (ref 0.0–1.2)
Total Protein: 4.8 g/dL — ABNORMAL LOW (ref 6.5–8.1)

## 2023-08-17 LAB — CSF CULTURE W GRAM STAIN: Culture: NO GROWTH

## 2023-08-17 LAB — BRAIN NATRIURETIC PEPTIDE: B Natriuretic Peptide: 369.2 pg/mL — ABNORMAL HIGH (ref 0.0–100.0)

## 2023-08-17 LAB — CULTURE, BLOOD (ROUTINE X 2): Culture: NO GROWTH

## 2023-08-17 LAB — HEPARIN LEVEL (UNFRACTIONATED): Heparin Unfractionated: 0.43 [IU]/mL (ref 0.30–0.70)

## 2023-08-17 LAB — MAGNESIUM: Magnesium: 1.9 mg/dL (ref 1.7–2.4)

## 2023-08-17 LAB — PROCALCITONIN: Procalcitonin: 0.1 ng/mL

## 2023-08-17 LAB — C-REACTIVE PROTEIN: CRP: 9.9 mg/dL — ABNORMAL HIGH (ref <1.0)

## 2023-08-17 MED ORDER — KCL IN DEXTROSE-NACL 20-5-0.45 MEQ/L-%-% IV SOLN
INTRAVENOUS | Status: DC
  Filled 2023-08-17 (×3): qty 1000

## 2023-08-17 NOTE — Progress Notes (Signed)
 A user error has taken place.

## 2023-08-17 NOTE — Care Management Important Message (Signed)
 Important Message  Patient Details  Name: Steven Meadows MRN: [id number] Date of Birth: March 12, 1932   Important Message Given:  Yes - Medicare IM     Wynonia Hedges 08/17/2023, 4:06 PM

## 2023-08-17 NOTE — TOC Progression Note (Signed)
 Transition of Care [facility]) - Progression Note    Patient Details  Name: SEICHI KAUFHOLD MRN: [id number] Date of Birth: Jan 04, 1932  Transition of Care [facility] CM/SW Contact  Jannice Mends, LCSW Phone Number: 08/17/2023, 3:35 PM  Clinical Narrative:    CSW continuing to follow.   Expected Discharge Plan: Skilled Nursing Facility Barriers to Discharge: Continued Medical Work up  Expected Discharge Plan and Services In-house Referral: Clinical Social Work     Living arrangements for the past 2 months: Assisted Living Facility                                       Social Determinants of Health (SDOH) Interventions SDOH Screenings   Food Insecurity: No Food Insecurity (08/13/2023)  Housing: High Risk (08/13/2023)  Transportation Needs: No Transportation Needs (08/13/2023)  Utilities: Not At Risk (08/13/2023)  Depression (PHQ2-9): Medium Risk (06/07/2020)  Social Connections: Moderately Isolated (08/13/2023)  Tobacco Use: Low Risk  (08/12/2023)    Readmission Risk Interventions     No data to display

## 2023-08-17 NOTE — Progress Notes (Signed)
 Speech Language Pathology Treatment: Dysphagia  Patient Details Name: Steven Meadows MRN: [id number] DOB: 06-30-1931 Today's Date: 08/17/2023 Time: 1914-7829 SLP Time Calculation (min) (ACUTE ONLY): 8 min  Assessment / Plan / Recommendation Clinical Impression  Pt seen for ongoing dysphagia management.  Pt with slight improvement of level of arousal and talking some today.  SLP provided oral care prior to administration of PO trials.  Pt tolerated sip of water by teaspoon and then requested a Coca-cola.  Pt initially with difficulty siphoning from straw.  Placed coke in mouth by straw pipette and pt began to suck on straw.  Replaced straw into drink and pt consumed around 4 oz by straw eagerly.  There was occasion cough or throat clear.  No wet or gurgly vocal quality. With puree by spoon there was oral manipulation and pharyngeal swallow reflex observed, but a moderate amount of bolus remained at anterior oral cavity.  Pt expectorated some applesauce and remainder was removed via suction. He is still drowsy/lethargic and is unlikely to be able to meet his nutritional needs orally, but will initiate a modified diet to allow for some oral intake.  Spoke with daughter [name] and son Gifford Shave in hall before session and by phone afterwards.  They are agreeable to initiation of liquid diet. Family is considering whether temporary NG would be desired and palliative care consult is pending. Family has already had conversations about end of life care and know that a PEG would not be desired.  Recommend clear liquid diet.  Pt especially likes Coke, please let him have some from floor stock.     HPI HPI: Steven Meadows is an 89 y.o. male presented to hospital after found to be unresponsive at his assisted living facility.  MRI 4/9: "On further review of the images there is subtle diffusion signal  abnormality in the right hippocampus with areas of restricted  diffusion noted within the body and tail of the hippocampus.   Additional subtle diffusion signal abnormality and restricted  diffusion involving the right dorsal thalamus. Findings are  concerning for acute to early subacute infarcts. Additional subtle  diffusion signal abnormality in the right temporal lobe without  definite restricted diffusion which may be artifactual versus  additional region of infarct." Daugher reports long period of seizure activity. CXR 4/12 without acute findings.  Pt with past medical history significant of hypertension, hyperlipidemia, atrial fibrillation, stroke, intracerebral hemorrhage, renal disease.      SLP Plan  Continue with current plan of care      Recommendations for follow up therapy are one component of a multi-disciplinary discharge planning process, led by the attending physician.  Recommendations may be updated based on patient status, additional functional criteria and insurance authorization.    Recommendations  Diet recommendations: Thin liquid Liquids provided via: No straw;Teaspoon;Cup Medication Administration: Via alternative means Supervision: Trained caregiver to feed patient Compensations: Slow rate;Small sips/bites Postural Changes and/or Swallow Maneuvers: Seated upright 90 degrees                  Oral care BID     Dysphagia, unspecified (R13.10)     Continue with current plan of care     Kerrie Pleasure, MA, CCC-SLP Acute Rehabilitation Services Office: [phone] 08/17/2023, 10:28 AM

## 2023-08-17 NOTE — Consult Note (Incomplete)
 Palliative Medicine Inpatient Consult Note  Consulting Provider: Elmer Picker, NP   Reason for consult:   Palliative Care Consult Services Palliative Medicine Consult  Reason for Consult? Family request- feeling conflicted about continued treatment   08/17/2023  HPI:  Per intake H&P --> 89 y.o. male with past medical history significant of hypertension, hyperlipidemia, atrial fibrillation, stroke, intracerebral hemorrhage, renal disease presented to hospital after found to be unresponsive at his assisted living facility.  He had been having progressive weakness recently and was found facedown on the floor.  Her workup in the ER was suggestive of possible acute and subacute stroke, ongoing seizures, suspicious for meningitis/encephalitis and he was admitted to the hospital, neurology was consulted.   Palliative care has been asked to support additional goals of care conversations.   Clinical Assessment/Goals of Care:  *Please note that this is a verbal dictation therefore any spelling or grammatical errors are due to the "[employer] One" system interpretation.  I have reviewed medical records including EPIC notes, labs and imaging, received report from bedside RN, assessed the patient.    I met with *** to further discuss diagnosis prognosis, GOC, EOL wishes, disposition and options.   I introduced Palliative Medicine as specialized medical care for people living with serious illness. It focuses on providing relief from the symptoms and stress of a serious illness. The goal is to improve quality of life for both the patient and the family.  Medical History Review and Understanding:    Social History:    Functional and Nutritional State:    Palliative Symptoms:    Advance Directives: A detailed discussion was had today regarding advanced directives.    Code Status: Concepts specific to code status, artifical feeding and hydration, continued IV antibiotics and  rehospitalization was had.  The difference between a aggressive medical intervention path  and a palliative comfort care path for this patient at this time was had.   Encouraged patient/family to consider DNR/DNI status understanding evidenced based poor outcomes in similar hospitalized patient, as the cause of arrest is likely associated with advanced chronic/terminal illness rather than an easily reversible acute cardio-pulmonary event. I explained that DNR/DNI does not change the medical plan and it only comes into effect after a person has arrested (died).  It is a protective measure to keep [place] from harming the patient in their last moments of life. *** was agreeable to DNR/DNI with understanding that patient would not receive CPR, defibrillation, ACLS medications, or intubation.   Discussion:    Discussed the importance of continued conversation with family and their  medical providers regarding overall plan of care and treatment options, ensuring decisions are within the context of the patients values and GOCs.  Provided *** "Hard Choices for Loving People" booklet.   Provided *** "Gone From My Site" booklet.  Decision Maker:  SUMMARY OF RECOMMENDATIONS    Code Status/Advance Care Planning: FULL CODE  DNAR/DNI  Modified CODE   Symptom Management:   Palliative Prophylaxis:  Aspiration, Bowel Regimen, Delirium Protocol, Frequent Pain Assessment, Oral Care, Palliative Wound Care, and Turn Reposition  Additional Recommendations (Limitations, Scope, Preferences): Avoid Hospitalization, Full Scope Treatment, No Artificial Feeding, No Surgical Procedures, and No Tracheostomy  Psycho-social/Spiritual:  Desire for further Chaplaincy support:  Additional Recommendations:    Prognosis:   Discharge Planning:   ROS  Oral Intake %:   I/O:   Bowel Movements:   Mobility:   Vitals:   08/17/23 1200 08/17/23 1306  BP: (!) 151/106 Marland Kitchen)  157/102  Pulse: 100 (!) 119  Resp: 20 19   Temp:  99.2 F (37.3 C)  SpO2: 95% 93%    Intake/Output Summary (Last 24 hours) at 08/17/2023 1552 Last data filed at 08/17/2023 1205 Gross per 24 hour  Intake 20 ml  Output 900 ml  Net -880 ml   Last Weight  Most recent update: 08/13/2023  8:34 AM    Weight  90 kg (198 lb 6.6 oz)             Gen:  NAD HEENT: moist mucous membranes CV: Regular rate and rhythm, no murmurs rubs or gallops PULM: clear to auscultation bilaterally. No wheezes/rales/rhonchi*** ABD: soft/nontender/nondistended/normal bowel sounds*** EXT: No edema*** Neuro: Alert and oriented x3***  PPS:   This conversation/these recommendations were discussed with patient primary care team, Dr. Aaron Aas  Time In: Time Out: Total Time: ***  Billing based on MDM: ***  {Problems Addressed:304933}  {Amount and/or Complexity of ZOXW:960454}  {Risks:304936} ______________________________________________________ [facility] [place] Palliative Medicine Team Team Cell Phone: [phone] Please utilize secure chat with additional questions, if there is no response within 30 minutes please call the above phone number  Palliative Medicine Team providers are available by phone from 7am to 7pm daily and can be reached through the team cell phone.  Should this patient require assistance outside of these hours, please call the patient's attending physician.

## 2023-08-17 NOTE — Plan of Care (Signed)

## 2023-08-17 NOTE — Progress Notes (Signed)
 PHARMACY - ANTICOAGULATION CONSULT NOTE  Pharmacy Consult for Heparin (PTA Eliquis on hold) Indication: atrial fibrillation  Allergies  Allergen Reactions   Nabumetone Other (See Comments)    Gi upset   Ace Inhibitors Cough   Flonase [Fluticasone Propionate] Other (See Comments)    Causes nosebleeds    Patient Measurements: Height: 5\' 11"  (180.3 cm) Weight: 90 kg (198 lb 6.6 oz) IBW/kg (Calculated) : 75.3 HEPARIN DW (KG): 90    Labs: Recent Labs    08/15/23 0556 08/15/23 1855 08/16/23 0320 08/16/23 1400 08/17/23 0518  HGB 11.8*  --  12.1*  --  12.2*  HCT 35.7*  --  36.6*  --  36.6*  PLT 131*  --  143*  --  165  APTT 43*  --   --   --   --   HEPARINUNFRC 0.22*   < > 0.42 0.37 0.43  CREATININE 0.87  --  0.84  --  0.83   < > = values in this interval not displayed.    Estimated Creatinine Clearance: 64.3 mL/min (by C-G formula based on SCr of 0.83 mg/dL).  Assessment: 73 YOM presenting with acute encephalopathy and concern for sepsis of unknown source of infection. PMH significant for atrial fibrillation and on Eliquis PTA, last dose given 4/8 PM. Eliquis held for LP and IV heparin begun on 4/9. Heparin held for LP on 4/11, then restarted on 4/12. Previously using aPTTs for monitoring, but now heparin levels due to correlating.  Eliquis remains on hold, NPO.  Heparin level is therapeutic (0.42) on 1400 units/hr.  Hemoglobin stable. Platelet count normal on admit >>low  4/12 and 4/13>> back into normal range today.  Goal of Therapy:  Heparin level 0.3-0.7 units/ml Monitor platelets by anticoagulation protocol: Yes   Plan:  Continue heparin drip at 1400 units/hr Daily heparin level and CBC. Monitor for signs/symptoms of bleeding. Follow up for resuming PTA Eliquis when able.  Adolphus Akin, RPh 08/17/2023,8:04 AM

## 2023-08-17 NOTE — Progress Notes (Addendum)
 PROGRESS NOTE                                                                                                                                                                                                             Patient Demographics:    Steven Meadows, is a 89 y.o. male, DOB - Jul 14, 1931, ZHY:[id number]  Outpatient Primary MD for the patient is Pcp, No    LOS - 5  Admit date - 08/12/2023    No chief complaint on file.      Brief Narrative (HPI from H&P)   89 y.o. male with past medical history significant of hypertension, hyperlipidemia, atrial fibrillation, stroke, intracerebral hemorrhage, renal disease presented to hospital after found to be unresponsive at his assisted living facility.  He had been having progressive weakness recently and was found facedown on the floor.  Her workup in the ER was suggestive of possible acute and subacute stroke, ongoing seizures, suspicious for meningitis/encephalitis and he was admitted to the hospital, neurology was consulted.   Subjective:    Steven Meadows today in bed, nearly obtunded unable to provide any review of systems   Assessment  & Plan :   Acute metabolic encephalopathy with sepsis present on admission.   Recently moved to assisted living facility where he was exposed by a sick contact diagnosed with herpes, subsequently found unresponsive by staff at assisted living facility.  Workup in the hospital has so far revealed acute to subacute strokes on MRI, ongoing seizures on EEG suspicious for status epilepticus, leukocytosis, near obtunded state.  Case discussed with neurology in detail on 08/15/2023, LP showed CSF not consistent with infection, MRI changes believe more likely due to prolonged seizure versus stroke.  Currently on AEDs, mentation gradually improving, de-escalated antibiotics on 08/15/2023.  Family will decide on 08/17/2023 if they want a temporary NG tube versus  transition to comfort care if no improvement in the next 2 to 3 days.  Palliative care also consulted for goals of care discussion, repeat MRI pending.  Overall no significant improvement.   Sepsis clinically ruled out, leukocytosis and fever likely reactionary from status epilepticus, de-escalate antibiotics to Unasyn, accounting for mild clinical aspiration while he was seizing.  Follow cultures.  1 out of 2 blood cultures with staph epidermis contamination.  Respiratory viral panel negative.  History of CVA  MRI noted, acute to subacute strokes, echocardiogram stable, continue heparin drip for underlying A-fib, PT OT and speech eval, continue combination of Crestor and Zetia.   History of atrial fibrillation.  Has been started on heparin drip.  Was on Eliquis at home.  Currently on hold.  Might need lumbar puncture.  As needed Cardizem.   Hypertension as needed Cardizem.   Hypokalemia.  Replaced.    Hyperlipidemia  -Home Zetia continue at home dose, Crestor dose will be increased once he is taking oral on hold.  LDL was above goal.  Mild bilateral upper extremity swelling.  Upper extremity venous duplex bilateral negative.  Keep upper extremities elevated.  Asymptomatic transaminitis.  Check right upper quadrant ultrasound, acute hepatitis panel, total bilirubin stable, could be due to transient hypotension or medication induced, will monitor.      Condition - Extremely Guarded  Family Communication  : Updated daughter in detail on 08/14/2023, family member bedside 08/15/2022, updated wife over the phone on 08/16/2023, daughter [name] on 08/16/2023, daughter and son-in-law bedside on 08/17/2023  Code Status : DNR  Consults  : Neurology, palliative care  PUD Prophylaxis : PPI   Procedures  :     Right arm PICC line placement on 08/16/2023.    MRI brain.   subtle diffusion signal abnormality in the right hippocampus with areas of restricted diffusion noted within the body and tail of the  hippocampus. Additional subtle diffusion signal abnormality and restricted diffusion involving the right dorsal thalamus. Findings are concerning for acute to early subacute infarcts.   CTA head and neck.   CTA neck: 1. The common carotid and internal carotid arteries are patent within the neck without hemodynamically significant stenosis. Atherosclerotic plaque about the carotid bifurcations and within the proximal internal carotid arteries, bilaterally. 2. The left vertebral artery is non-dominant and developmentally diminutive, but patent throughout the neck. 3. The dominant right vertebral artery is patent within the neck. Non-stenotic atherosclerotic plaque at the origin of this vessel. 4. Aortic Atherosclerosis (ICD10-I70.0).   CTA head: 1. No proximal intracranial large vessel occlusion identified. 2. Intracranial atherosclerotic disease with multifocal stenoses, most notably as follows. 3. Severe stenosis within a left PCA branch at the P2/P3 junction, new from prior exams. 4. Sites of up to moderate stenosis within the intracranial right vertebral artery. 5. Up to moderate stenosis of the supraclinoid right internal carotid artery. 6. 7-8 mm aneurysm at the vertebrobasilar junction on the right, unchanged in size from the CTA of 04/30/2020  EEG.  Positive for seizures.    Echocardiogram.  1. Left ventricular ejection fraction, by estimation, is 60 to 65%. The left ventricle has normal function. The left ventricle has no regional wall motion abnormalities. Left ventricular diastolic parameters are indeterminate.  2. Right ventricular systolic function is moderately reduced. The right ventricular size is moderately enlarged. There is normal pulmonary artery systolic pressure. The estimated right ventricular systolic pressure is 33.5 mmHg.  3. Left atrial size was severely dilated.  4. Right atrial size was severely dilated.  5. The mitral valve was not well visualized. Trivial mitral valve  regurgitation. Moderate mitral annular calcification.  6. The aortic valve is tricuspid. Aortic valve regurgitation is not visualized. Aortic valve sclerosis/calcification is present, without any evidence of aortic stenosis.  7. Aortic dilatation noted. There is mild dilatation of the ascending aorta.  8. The inferior vena cava is dilated in size with <50% respiratory variability, suggesting right atrial pressure of 15 mmHg  LP -CSF not  consistent with infection.      Disposition Plan  :    Status is: Inpatient  DVT Prophylaxis  :  SCDs/Hep gtt  Lab Results  Component Value Date   PLT 165 08/17/2023    Diet :  Diet Order             Diet NPO time specified  Diet effective now                    Inpatient Medications  Scheduled Meds:  Chlorhexidine Gluconate Cloth  6 each Topical Daily   pantoprazole (PROTONIX) IV  40 mg Intravenous Q24H   sodium chloride flush  10-40 mL Intracatheter Q12H   sodium chloride flush  3 mL Intravenous Q12H   Continuous Infusions:  ampicillin-sulbactam (UNASYN) IV 3 g (08/17/23 0940)   dextrose 5 % and 0.45 % NaCl with KCl 20 mEq/L     heparin 1,400 Units/hr (08/17/23 0541)   lacosamide (VIMPAT) IV 200 mg (08/16/23 2212)   PRN Meds:.acetaminophen **OR** acetaminophen, diltiazem, LORazepam, polyethylene glycol    Objective:   Vitals:   08/16/23 1000 08/16/23 1200 08/16/23 1600 08/16/23 2000  BP: 131/73 (!) 122/92 137/80   Pulse: 95 85 86   Resp: (!) 24 16 (!) 25   Temp: 98 F (36.7 C)  98 F (36.7 C) 98.4 F (36.9 C)  TempSrc: Axillary  Axillary Axillary  SpO2: 99% 98% 99% 96%  Weight:      Height:        Wt Readings from Last 3 Encounters:  08/13/23 90 kg  01/19/22 90.7 kg  06/10/21 98.9 kg     Intake/Output Summary (Last 24 hours) at 08/17/2023 1011 Last data filed at 08/17/2023 0940 Gross per 24 hour  Intake 0 ml  Output 900 ml  Net -900 ml     Physical Exam  Somnolent, will respond to a few questions  off-and-on, still extremely confused, no new F.N deficits, right arm PICC line in place [place].AT,PERRAL Supple Neck, No JVD,   Symmetrical Chest wall movement, Good air movement bilaterally, CTAB RRR,No Gallops,Rubs or new Murmurs,  +ve B.Sounds, Abd Soft, No tenderness,   No Cyanosis, Clubbing or edema      Data Review:    Recent Labs  Lab 08/12/23 1037 08/12/23 1044 08/13/23 0406 08/14/23 1003 08/15/23 0556 08/16/23 0320 08/17/23 0518  WBC 21.4*  --  15.1* 12.0* 9.9 9.0 8.4  HGB 15.7   < > 14.6 12.8* 11.8* 12.1* 12.2*  HCT 48.0   < > 43.8 37.4* 35.7* 36.6* 36.6*  PLT 246  --  208 158 131* 143* 165  MCV 87.6  --  85.0 85.8 85.6 87.1 86.9  MCH 28.6  --  28.3 29.4 28.3 28.8 29.0  MCHC 32.7  --  33.3 34.2 33.1 33.1 33.3  RDW 13.5  --  13.8 13.9 13.7 13.6 13.7  LYMPHSABS 1.0  --   --   --  1.1 1.2 1.3  MONOABS 2.7*  --   --   --  1.9* 1.5* 1.4*  EOSABS 0.0  --   --   --  0.1 0.1 0.2  BASOSABS 0.0  --   --   --  0.0 0.0 0.0   < > = values in this interval not displayed.    Recent Labs  Lab 08/12/23 1037 08/12/23 1044 08/12/23 1200 08/12/23 1412 08/13/23 0406 08/14/23 0541 08/14/23 0609 08/14/23 1003 08/15/23 0556 08/16/23 0320 08/17/23 0518  NA 133*   < >  --   --  134* 134*  --   --  133* 134* 134*  K 4.9   < >  --   --  3.7 3.2*  --   --  2.9* 2.9* 3.5  CL 97*   < >  --   --  99 100  --   --  99 100 101  CO2 18*  --   --   --  22 23  --   --  24 25 26   ANIONGAP 18*  --   --   --  13 11  --   --  10 9 7   GLUCOSE 169*   < >  --   --  150* 112*  --   --  109* 105* 81  BUN 15   < >  --   --  14 16  --   --  12 11 13   CREATININE 1.50*   < >  --   --  1.18 1.08  --   --  0.87 0.84 0.83  AST 49*  --   --   --  266*  --   --   --  218* 200* 174*  ALT 31  --   --   --  61*  --   --   --  57* 60* 55*  ALKPHOS 31*  --   --   --  24*  --   --   --  19* 19* 18*  BILITOT 0.5  --   --   --  1.0  --   --   --  0.8 0.8 0.7  ALBUMIN 4.2  --   --   --  3.2*  --   --   --  2.4*  2.3* 2.1*  CRP  --   --   --   --   --   --   --  11.1* 7.2* 6.9* 9.9*  PROCALCITON  --   --   --   --   --   --  0.28  --  0.18 0.12 0.10  LATICACIDVEN  --   --  3.5* 3.8*  --   --   --   --   --   --   --   INR 1.3*  --   --   --   --   --   --   --   --   --   --   BNP  --   --   --   --   --   --   --  295.7* 298.3* 473.2* 369.2*  MG  --   --   --   --   --  1.8  --   --  1.7 1.6* 1.9  PHOS  --   --   --   --   --   --   --   --  2.1* 1.9* 2.7  CALCIUM 9.1  --   --   --  8.6* 8.3*  --   --  7.8* 7.8* 7.7*   < > = values in this interval not displayed.      Recent Labs  Lab 08/12/23 1037 08/12/23 1200 08/12/23 1412 08/13/23 0406 08/14/23 0541 08/14/23 0609 08/14/23 1003 08/15/23 0556 08/16/23 0320 08/17/23 0518  CRP  --   --   --   --   --   --  11.1* 7.2* 6.9* 9.9*  PROCALCITON  --   --   --   --   --  0.28  --  0.18 0.12 0.10  LATICACIDVEN  --  3.5* 3.8*  --   --   --   --   --   --   --   INR 1.3*  --   --   --   --   --   --   --   --   --   BNP  --   --   --   --   --   --  295.7* 298.3* 473.2* 369.2*  MG  --   --   --   --  1.8  --   --  1.7 1.6* 1.9  CALCIUM 9.1  --   --  8.6* 8.3*  --   --  7.8* 7.8* 7.7*    --------------------------------------------------------------------------------------------------------------- Lab Results  Component Value Date   CHOL 132 12/08/2020   HDL 26 (L) 12/08/2020   LDLCALC 98 12/08/2020   TRIG 39 12/08/2020   CHOLHDL 5.1 12/08/2020    Lab Results  Component Value Date   HGBA1C 6.0 (H) 12/08/2020      Micro Results Recent Results (from the past 240 hours)  Culture, blood (routine x 2)     Status: None   Collection Time: 08/12/23 11:22 AM   Specimen: BLOOD LEFT HAND  Result Value Ref Range Status   Specimen Description BLOOD LEFT HAND  Final   Special Requests   Final    BOTTLES DRAWN AEROBIC AND ANAEROBIC Blood Culture results may not be optimal due to an inadequate volume of blood received in culture bottles    Culture   Final    NO GROWTH 5 DAYS Performed at [facility], [address] [place], [place] [address]    Report Status 08/17/2023 FINAL  Final  Resp panel by RT-PCR (RSV, Flu A&B, Covid) Anterior Nasal Swab     Status: None   Collection Time: 08/12/23 11:26 AM   Specimen: Anterior Nasal Swab  Result Value Ref Range Status   SARS Coronavirus 2 by RT PCR NEGATIVE NEGATIVE Final   Influenza A by PCR NEGATIVE NEGATIVE Final   Influenza B by PCR NEGATIVE NEGATIVE Final    Comment: (NOTE) The Xpert Xpress SARS-CoV-2/FLU/RSV plus assay is intended as an aid in the diagnosis of influenza from Nasopharyngeal swab specimens and should not be used as a sole basis for treatment. Nasal washings and aspirates are unacceptable for Xpert Xpress SARS-CoV-2/FLU/RSV testing.  Fact Sheet for Patients: BloggerCourse.com  Fact Sheet for Healthcare Providers: SeriousBroker.it  This test is not yet approved or cleared by the [place] FDA and has been authorized for detection and/or diagnosis of SARS-CoV-2 by FDA under an Emergency Use Authorization (EUA). This EUA will remain in effect (meaning this test can be used) for the duration of the COVID-19 declaration under Section 564(b)(1) of the Act, 21 U.S.C. section 360bbb-3(b)(1), unless the authorization is terminated or revoked.     Resp Syncytial Virus by PCR NEGATIVE NEGATIVE Final    Comment: (NOTE) Fact Sheet for Patients: BloggerCourse.com  Fact Sheet for Healthcare Providers: SeriousBroker.it  This test is not yet approved or cleared by the [place] FDA and has been authorized for detection and/or diagnosis of SARS-CoV-2 by FDA under an Emergency Use Authorization (EUA). This EUA will remain in effect (meaning this test can be used) for the duration of the COVID-19 declaration under Section 564(b)(1) of the Act, 21  U.S.C. section 360bbb-3(b)(1), unless the authorization is terminated or revoked.  Performed at [facility], [address], [place], [place] [address]   Culture, blood (routine x 2)     Status: Abnormal   Collection Time: 08/12/23 11:55 AM   Specimen: BLOOD LEFT ARM  Result Value Ref Range Status   Specimen Description BLOOD LEFT ARM  Final   Special Requests   Final    BOTTLES DRAWN AEROBIC AND ANAEROBIC Blood Culture adequate volume   Culture  Setup Time   Final    GRAM POSITIVE COCCI ANAEROBIC BOTTLE ONLY CRITICAL RESULT CALLED TO, READ BACK BY AND VERIFIED WITH: PHARMD Francie Massing on [phone] @0755  by SM    Culture (A)  Final    STAPHYLOCOCCUS EPIDERMIDIS THE SIGNIFICANCE OF ISOLATING THIS ORGANISM FROM A SINGLE SET OF BLOOD CULTURES WHEN MULTIPLE SETS ARE DRAWN IS UNCERTAIN. PLEASE NOTIFY THE MICROBIOLOGY DEPARTMENT WITHIN ONE WEEK IF SPECIATION AND SENSITIVITIES ARE REQUIRED. Performed at [facility], [address], [place], [place] [address]    Report Status 08/15/2023 FINAL  Final  Blood Culture ID Panel (Reflexed)     Status: Abnormal   Collection Time: 08/12/23 11:55 AM  Result Value Ref Range Status   Enterococcus faecalis NOT DETECTED NOT DETECTED Final   Enterococcus Faecium NOT DETECTED NOT DETECTED Final   Listeria monocytogenes NOT DETECTED NOT DETECTED Final   Staphylococcus species DETECTED (A) NOT DETECTED Final    Comment: CRITICAL RESULT CALLED TO, READ BACK BY AND VERIFIED WITH: PHARMD Francie Massing on [phone] @0755  by SM    Staphylococcus aureus (BCID) NOT DETECTED NOT DETECTED Final   Staphylococcus epidermidis DETECTED (A) NOT DETECTED Final    Comment: Methicillin (oxacillin) resistant coagulase negative staphylococcus. Possible blood culture contaminant (unless isolated from more than one blood culture draw or clinical case suggests pathogenicity). No antibiotic treatment is indicated for blood  culture contaminants. CRITICAL RESULT CALLED TO,  READ BACK BY AND VERIFIED WITH: PHARMD Bailey A on [phone] @0755  by SM    Staphylococcus lugdunensis NOT DETECTED NOT DETECTED Final   Streptococcus species NOT DETECTED NOT DETECTED Final   Streptococcus agalactiae NOT DETECTED NOT DETECTED Final   Streptococcus pneumoniae NOT DETECTED NOT DETECTED Final   Streptococcus pyogenes NOT DETECTED NOT DETECTED Final   A.calcoaceticus-baumannii NOT DETECTED NOT DETECTED Final   Bacteroides fragilis NOT DETECTED NOT DETECTED Final   Enterobacterales NOT DETECTED NOT DETECTED Final   Enterobacter cloacae complex NOT DETECTED NOT DETECTED Final   Escherichia coli NOT DETECTED NOT DETECTED Final   Klebsiella aerogenes NOT DETECTED NOT DETECTED Final   Klebsiella oxytoca NOT DETECTED NOT DETECTED Final   Klebsiella pneumoniae NOT DETECTED NOT DETECTED Final   Proteus species NOT DETECTED NOT DETECTED Final   Salmonella species NOT DETECTED NOT DETECTED Final   Serratia marcescens NOT DETECTED NOT DETECTED Final   Haemophilus influenzae NOT DETECTED NOT DETECTED Final   Neisseria meningitidis NOT DETECTED NOT DETECTED Final   Pseudomonas aeruginosa NOT DETECTED NOT DETECTED Final   Stenotrophomonas maltophilia NOT DETECTED NOT DETECTED Final   Candida albicans NOT DETECTED NOT DETECTED Final   Candida auris NOT DETECTED NOT DETECTED Final   Candida glabrata NOT DETECTED NOT DETECTED Final   Candida krusei NOT DETECTED NOT DETECTED Final   Candida parapsilosis NOT DETECTED NOT DETECTED Final   Candida tropicalis NOT DETECTED NOT DETECTED Final   Cryptococcus neoformans/gattii NOT DETECTED NOT DETECTED Final   Methicillin resistance mecA/C DETECTED (A) NOT DETECTED Final    Comment: CRITICAL RESULT CALLED TO, READ BACK  BY AND VERIFIED WITH: PHARMD Francie Massing on [phone] @0755  by SM Performed at [facility], [address], [place], [place] [address]   MRSA Next Gen by PCR, Nasal     Status: None   Collection Time: 08/14/23  8:48 AM    Specimen: Nasal Mucosa; Nasal Swab  Result Value Ref Range Status   MRSA by PCR Next Gen NOT DETECTED NOT DETECTED Final    Comment: (NOTE) The GeneXpert MRSA Assay (FDA approved for NASAL specimens only), is one component of a comprehensive MRSA colonization surveillance program. It is not intended to diagnose MRSA infection nor to guide or monitor treatment for MRSA infections. Test performance is not FDA approved in patients less than 48 years old. Performed at [facility], [address], [place], [place] [address]   CSF culture w Gram Stain     Status: None (Preliminary result)   Collection Time: 08/14/23  3:57 PM   Specimen: CSF; Cerebrospinal Fluid  Result Value Ref Range Status   Specimen Description CSF  Final   Special Requests NONE  Final   Gram Stain   Final    WBC PRESENT, PREDOMINANTLY MONONUCLEAR NO ORGANISMS SEEN CYTOSPIN SMEAR    Culture   Final    NO GROWTH 2 DAYS Performed at [facility], [address], [place], [place] [address]    Report Status PENDING  Incomplete  Culture, fungus without smear     Status: None (Preliminary result)   Collection Time: 08/14/23  3:57 PM   Specimen: CSF; Cerebrospinal Fluid  Result Value Ref Range Status   Specimen Description CSF  Final   Special Requests NONE  Final   Culture   Final    NO FUNGUS ISOLATED AFTER 2 DAYS Performed at [facility], [address], [place], [place] [address]    Report Status PENDING  Incomplete    Radiology Report DG Chest Port 1 View Result Date: 08/17/2023 CLINICAL DATA:  Shortness of breath. EXAM: PORTABLE CHEST 1 VIEW COMPARISON:  08/15/2023 FINDINGS: The cardio pericardial silhouette is enlarged. Interval progression of interstitial opacity in the right lung with bibasilar atelectasis or infiltrate in similar small bilateral pleural effusions. No acute bony abnormality. Right PICC line tip overlies the proximal SVC level. Telemetry leads overlie the chest.  IMPRESSION: Interval progression of interstitial opacity in the right lung with persistent bibasilar atelectasis or infiltrate and similar small bilateral pleural effusions. Electronically Signed   By: Kennith Center M.D.   On: 08/17/2023 06:21   US Abdomen Limited RUQ (LIVER/GB) Result Date: 08/16/2023 CLINICAL DATA:  Transaminitis EXAM: ULTRASOUND ABDOMEN LIMITED RIGHT UPPER QUADRANT COMPARISON:  None Available. FINDINGS: Gallbladder: No visible stones. Slight gallbladder wall thickening at 4 mm. Negative sonographic Murphy's. Common bile duct: Diameter: Normal caliber, 5 mm. Liver: No focal lesion identified. Within normal limits in parenchymal echogenicity. Portal vein is patent on color Doppler imaging with normal direction of blood flow towards the liver. Other: None. IMPRESSION: No evidence of cholelithiasis or acute cholecystitis. No acute findings. Electronically Signed   By: Charlett Nose M.D.   On: 08/16/2023 19:10   VAS [place] UPPER EXTREMITY VENOUS DUPLEX Result Date: 08/16/2023 UPPER VENOUS STUDY  Patient Name:  MACRAE WIEGMAN  Date of Exam:   08/15/2023 Medical Rec #: [id number]       Accession #:    [id number] Date of Birth: Nov 21, 1931        Patient Gender: M Patient Age:  89 years Exam Location:  [facility] Procedure:      VAS [place] UPPER EXTREMITY VENOUS DUPLEX Referring Phys: [facility] --------------------------------------------------------------------------------  Indications: Swelling. Patient found face-down and unresponsive at living facility with arms under him. CK elevated >3,000 Risk Factors: Atrial fibrillation. Anticoagulation: Eliquis outside of hospital, Heparin infusion in hospital. Limitations: Swelling, patient asleep and unable to keep arms extended and poor ultrasound/tissue interface. Comparison Study: No prior study Performing Technologist: Sherren Kerns RVS  Examination Guidelines: A complete evaluation includes B-mode imaging, spectral Doppler, color Doppler, and  power Doppler as needed of all accessible portions of each vessel. Bilateral testing is considered an integral part of a complete examination. Limited examinations for reoccurring indications may be performed as noted.  Right Findings: +----------+------------+---------+-----------+----------+-------+ RIGHT     CompressiblePhasicitySpontaneousPropertiesSummary +----------+------------+---------+-----------+----------+-------+ IJV           Full       Yes       Yes                      +----------+------------+---------+-----------+----------+-------+ Subclavian    Full       Yes       Yes                      +----------+------------+---------+-----------+----------+-------+ Axillary                 Yes       Yes                      +----------+------------+---------+-----------+----------+-------+ Brachial      Full                                          +----------+------------+---------+-----------+----------+-------+ Radial        Full                                          +----------+------------+---------+-----------+----------+-------+ Ulnar         Full                                          +----------+------------+---------+-----------+----------+-------+ Cephalic      Full                                          +----------+------------+---------+-----------+----------+-------+ Basilic       Full                                          +----------+------------+---------+-----------+----------+-------+  Left Findings: +----------+------------+---------+-----------+----------+---------------+ LEFT      CompressiblePhasicitySpontaneousProperties    Summary     +----------+------------+---------+-----------+----------+---------------+ IJV           Full       Yes       Yes                              +----------+------------+---------+-----------+----------+---------------+  Subclavian    Full       Yes       Yes                               +----------+------------+---------+-----------+----------+---------------+ Axillary      Full       Yes       Yes                              +----------+------------+---------+-----------+----------+---------------+ Brachial                 Yes       Yes                              +----------+------------+---------+-----------+----------+---------------+ Radial        Full                                                  +----------+------------+---------+-----------+----------+---------------+ Ulnar                                               patent by color +----------+------------+---------+-----------+----------+---------------+ Basilic       Full                                                  +----------+------------+---------+-----------+----------+---------------+  Summary:  Right: No evidence of deep vein thrombosis in the upper extremity. No evidence of superficial vein thrombosis in the upper extremity.  Left: No evidence of deep vein thrombosis in the upper extremity. No evidence of superficial vein thrombosis in the upper extremity.  *See table(s) above for measurements and observations.  Diagnosing physician: Irvin Mantel Electronically signed by Irvin Mantel on 08/16/2023 at 9:24:43 AM.    Final    DG CHEST PORT 1 VIEW Result Date: 08/15/2023 CLINICAL DATA:  [id number] Status post PICC central line placement [id number] EXAM: PORTABLE CHEST 1 VIEW COMPARISON:  Chest x-ray 08/14/2023 FINDINGS: Interval placement of a right PICC with tip overlying the distal superior vena cava. The heart and mediastinal contours are unchanged. Atherosclerotic plaque. No focal consolidation. No pulmonary edema. No pleural effusion. No pneumothorax. No acute osseous abnormality. IMPRESSION: 1. Right PICC with tip overlying the distal superior vena cava. 2. No acute cardiopulmonary abnormality. 3.  Aortic Atherosclerosis (ICD10-I70.0). Electronically Signed    By: Morgane  Naveau M.D.   On: 08/15/2023 19:34   US  EKG SITE RITE Result Date: 08/15/2023 If Site Rite image not attached, placement could not be confirmed due to current cardiac rhythm.    Signature  -   Lynnwood Sauer M.D on 08/17/2023 at 10:11 AM   -  To page go to [email or web address]

## 2023-08-17 NOTE — OR Nursing (Addendum)
 2112: Patient was for an MRI last night (08/16/23-08/17/23). MRI transporter appeared on 5W without prior notification and during 2200 med. pass and other timed nursing treatments. I, Masai Kidd, RN instructed the transporter that I would call for a reschedule. Approximately 30-45 minutes later, the transporter arrived again, without prior notification, to only be told the same thing = med. passes were still in progress.  0100: I called MRI and instructed them the patient was ready for MRI. The MRI dpt. stated they now had some emergent patients from the ED and stated they would call back when ready.  5409: Called MRI dept. for updates. Stated they will call me back when they are ready for the patient.  8119: I, Kataleia Quaranta called MRI back once more but no answer.    NOTE: RN has to go with patient to MRI because of Heparin drip and 4 liters of high flow O2.  All above information was reported to 7a-7p RN during shift report on 08/17/23.Aaron Aas

## 2023-08-17 NOTE — Progress Notes (Signed)
 NEUROLOGY CONSULT FOLLOW UP NOTE   Date of service: August 17, 2023 Patient Name: Steven Meadows MRN:  [id number] DOB:  09/15/1931  Brief HPI  Steven Meadows is a 89 y.o. male with a history of previous stroke, encephalitis at age 4, atrial fibrillation on Eliquis, recent decline who presented on 08/12/2023 with decreased responsiveness.  On my assessment initially, he was improving and no longer unresponsive, but he did have a right gaze preference, and it apparent left neglect as well.  MRI and CTA have been negative on 4/9.  Routine EEG on 4/9 was negative.  He has an elevated white count and elevated fever to 102.2.  He was started on empiric CNS coverage, but LP on 4/11 was negative.  EEG on 4/9 was initially negative, but repeat continuous EEG from 4/10 to 4/11 revealed multiple seizures prior to being loaded with antiepileptics with no seizures following load, but continued periodic discharges.  This improved over the course of the study.  He had no seizures through 1045pm when he was disconnected.   Interval Hx/subjective   AST/ALT improving, CRP elevated to 9.9 this morning  Son and Daughter at the bedside. They are conflicted about proceeding with any sort of feeding tube as patient does have advanced directives. Palliative care consult discussed and they are interested in meeting with them. Dr. Zelda Meadows updated and consult placed.  Remains drowsy this morning. Does open eyes to stimuli and does follow commands with eyes and wiggles toes. Does not answer other questions for examiner, but does say "good morning" and "it's okay".   On Attending MD eval. Opens eyes briefly to voice but falls asleep readily.  Reports name is Steven Meadows, reports age is 89, reports he is in [place], [place], minimal other usable speech with some word salad in response to asking more about his situation. Some element of left sided neglect, looking towards right when examiner calls from left but eventually able to overcome  gaze preference and look fully to the left. PERRL, face with notable left facial droop on grimace.  Withdraws to pain in all 4, more brisk in the right than the left, not clearly following commands for me.     Vitals   Vitals:   08/16/23 1000 08/16/23 1200 08/16/23 1600 08/16/23 2000  BP: 131/73 (!) 122/92 137/80   Pulse: 95 85 86   Resp: (!) 24 16 (!) 25   Temp: 98 F (36.7 C)  98 F (36.7 C) 98.4 F (36.9 C)  TempSrc: Axillary  Axillary Axillary  SpO2: 99% 98% 99% 96%  Weight:      Height:         Body mass index is 27.67 kg/m.  Physical Exam   General, in bed, NAD  Neurologic Examination   Gen: in bed, NAD  Neuro: MS: He is somnolent but arousable able to answer simple questions and follow commands, but very lethargic CN: When initially opening eyes gaze is to the right, but crosses midline fully. Head is turned to the right as well. Hard of hearing at baseline  Motor: Withdraws in all extremities and wiggles toes bilaterally. No spontaneous movement in bilateral upper extremities.  Endorses pain in the left arm. Both arms are edematous, left worse than right.  Sensory: He reports sensation bilaterally  Labs and Diagnostic Imaging   CBC:  Recent Labs  Lab 08/16/23 0320 08/17/23 0518  WBC 9.0 8.4  NEUTROABS 6.0 5.2  HGB 12.1* 12.2*  HCT 36.6* 36.6*  MCV  87.1 86.9  PLT 143* 165    Basic Metabolic Panel:  Lab Results  Component Value Date   NA 134 (L) 08/17/2023   K 3.5 08/17/2023   CO2 26 08/17/2023   GLUCOSE 81 08/17/2023   BUN 13 08/17/2023   CREATININE 0.83 08/17/2023   CALCIUM 7.7 (L) 08/17/2023   GFRNONAA >60 08/17/2023   GFRAA 61 08/18/2019   Lipid Panel:  Lab Results  Component Value Date   LDLCALC 98 12/08/2020   HgbA1c:  Lab Results  Component Value Date   HGBA1C 6.0 (H) 12/08/2020   Urine Drug Screen:     Component Value Date/Time   LABOPIA NONE DETECTED 08/12/2023 1300   COCAINSCRNUR NONE DETECTED 08/12/2023 1300   LABBENZ  NONE DETECTED 08/12/2023 1300   AMPHETMU NONE DETECTED 08/12/2023 1300   THCU NONE DETECTED 08/12/2023 1300   LABBARB NONE DETECTED 08/12/2023 1300     Imaging(Personally reviewed): MRI-he has subtle diffusion change in the left medial temporal lobe CTA is negative   Impression   Steven Meadows is a 89 y.o. male presenting with fever, altered mental status, focal neurological deficits.    He was found to have recurrent focal right hemispheric seizures.  On MRI, there is some subtle restricted diffusion change in the right hemisphere, I suspect that this is more likely related to seizure effect than stroke.  In any case, secondary prevention of stroke would consist of anticoagulation, he is currently on a heparin infusion.    He has continued to be somnolent despite discontinuing Keppra.  Depakote was started, but with increasing LFTs this was changed to Lacosamide. Increased to 200mg  BID now.   Previous AEDS: Keppra - discontinued due to somnolence Depakote- discontinued due to elevated LFTS  Current AEDs: Lacosamide 200mg  BID   Recommendations  Continue lacosamide to 200 mg twice daily Repeat MRI to assess for improvement in seizure edema versus stroke If this is negative will consider repeat EEG to confirm seizure control after adjustment of AEDs Neurology will continue to follow. ______________________________________________________________________  Patient seen and examined by NP/APP with MD. MD to update note as needed.   Steven Mana, DNP, FNP-BC [facility] Pager: [phone]  Attending Neurologist's note:  I personally saw this patient, gathering history, performing a full neurologic examination, reviewing relevant labs, personally reviewing relevant imaging including MRI brain, and formulated the assessment and plan, adding the note above for completeness and clarity to accurately reflect my thoughts   Steven Levee MD-PhD [facility] [phone] Available 7 AM to 7 PM, outside these hours please contact Neurologist on call listed on AMION

## 2023-08-18 ENCOUNTER — Encounter: Admitting: Internal Medicine

## 2023-08-18 ENCOUNTER — Encounter (HOSPITAL_COMMUNITY)

## 2023-08-18 DIAGNOSIS — Z7189 Other specified counseling: Secondary | ICD-10-CM

## 2023-08-18 DIAGNOSIS — R29818 Other symptoms and signs involving the nervous system: Secondary | ICD-10-CM | POA: Diagnosis not present

## 2023-08-18 DIAGNOSIS — Z515 Encounter for palliative care: Secondary | ICD-10-CM | POA: Diagnosis not present

## 2023-08-18 DIAGNOSIS — R4182 Altered mental status, unspecified: Secondary | ICD-10-CM | POA: Diagnosis not present

## 2023-08-18 DIAGNOSIS — R569 Unspecified convulsions: Secondary | ICD-10-CM | POA: Diagnosis not present

## 2023-08-18 DIAGNOSIS — G9340 Encephalopathy, unspecified: Secondary | ICD-10-CM | POA: Diagnosis not present

## 2023-08-18 DIAGNOSIS — I4891 Unspecified atrial fibrillation: Secondary | ICD-10-CM | POA: Diagnosis not present

## 2023-08-18 DIAGNOSIS — R063 Periodic breathing: Secondary | ICD-10-CM

## 2023-08-18 LAB — C-REACTIVE PROTEIN: CRP: 7.7 mg/dL — ABNORMAL HIGH (ref <1.0)

## 2023-08-18 LAB — CBC WITH DIFFERENTIAL/PLATELET
Abs Immature Granulocytes: 0.16 10*3/uL — ABNORMAL HIGH (ref 0.00–0.07)
Basophils Absolute: 0 10*3/uL (ref 0.0–0.1)
Basophils Relative: 0 %
Eosinophils Absolute: 0.2 10*3/uL (ref 0.0–0.5)
Eosinophils Relative: 2 %
HCT: 37.1 % — ABNORMAL LOW (ref 39.0–52.0)
Hemoglobin: 12.3 g/dL — ABNORMAL LOW (ref 13.0–17.0)
Immature Granulocytes: 2 %
Lymphocytes Relative: 11 %
Lymphs Abs: 1.1 10*3/uL (ref 0.7–4.0)
MCH: 29 pg (ref 26.0–34.0)
MCHC: 33.2 g/dL (ref 30.0–36.0)
MCV: 87.5 fL (ref 80.0–100.0)
Monocytes Absolute: 1.6 10*3/uL — ABNORMAL HIGH (ref 0.1–1.0)
Monocytes Relative: 17 %
Neutro Abs: 6.2 10*3/uL (ref 1.7–7.7)
Neutrophils Relative %: 68 %
Platelets: 167 10*3/uL (ref 150–400)
RBC: 4.24 MIL/uL (ref 4.22–5.81)
RDW: 13.6 % (ref 11.5–15.5)
WBC: 9.3 10*3/uL (ref 4.0–10.5)
nRBC: 0 % (ref 0.0–0.2)

## 2023-08-18 LAB — COMPREHENSIVE METABOLIC PANEL WITH GFR
ALT: 51 U/L — ABNORMAL HIGH (ref 0–44)
AST: 131 U/L — ABNORMAL HIGH (ref 15–41)
Albumin: 2 g/dL — ABNORMAL LOW (ref 3.5–5.0)
Alkaline Phosphatase: 18 U/L — ABNORMAL LOW (ref 38–126)
Anion gap: 8 (ref 5–15)
BUN: 13 mg/dL (ref 8–23)
CO2: 25 mmol/L (ref 22–32)
Calcium: 7.8 mg/dL — ABNORMAL LOW (ref 8.9–10.3)
Chloride: 104 mmol/L (ref 98–111)
Creatinine, Ser: 0.86 mg/dL (ref 0.61–1.24)
GFR, Estimated: >60 mL/min (ref >60)
Glucose, Bld: 92 mg/dL (ref 70–99)
Potassium: 3.6 mmol/L (ref 3.5–5.1)
Sodium: 137 mmol/L (ref 135–145)
Total Bilirubin: 0.8 mg/dL (ref 0.0–1.2)
Total Protein: 4.7 g/dL — ABNORMAL LOW (ref 6.5–8.1)

## 2023-08-18 LAB — BRAIN NATRIURETIC PEPTIDE: B Natriuretic Peptide: 295 pg/mL — ABNORMAL HIGH (ref 0.0–100.0)

## 2023-08-18 LAB — HEPARIN LEVEL (UNFRACTIONATED): Heparin Unfractionated: 0.38 [IU]/mL (ref 0.30–0.70)

## 2023-08-18 LAB — PHOSPHORUS: Phosphorus: 2.6 mg/dL (ref 2.5–4.6)

## 2023-08-18 LAB — MAGNESIUM: Magnesium: 1.8 mg/dL (ref 1.7–2.4)

## 2023-08-18 LAB — VDRL, CSF: VDRL Quant, CSF: NONREACTIVE

## 2023-08-18 LAB — PROCALCITONIN: Procalcitonin: <0.1 ng/mL

## 2023-08-18 MED ORDER — GLYCOPYRROLATE 1 MG PO TABS: 1.0000 mg | ORAL_TABLET | ORAL | Status: DC | PRN

## 2023-08-18 MED ORDER — KCL IN DEXTROSE-NACL 20-5-0.45 MEQ/L-%-% IV SOLN
INTRAVENOUS | Status: AC
  Filled 2023-08-18: qty 1000

## 2023-08-18 MED ORDER — GLYCOPYRROLATE 0.2 MG/ML IJ SOLN: 0.2000 mg | INTRAMUSCULAR | Status: DC | PRN

## 2023-08-18 MED ORDER — LORAZEPAM 2 MG/ML IJ SOLN: 2.0000 mg | INTRAMUSCULAR | Status: DC | PRN

## 2023-08-18 MED ORDER — ONDANSETRON 4 MG PO TBDP: 4.0000 mg | ORAL_TABLET | Freq: Four times a day (QID) | ORAL | Status: DC | PRN

## 2023-08-18 MED ORDER — BIOTENE DRY MOUTH MT LIQD: 15.0000 mL | OROMUCOSAL | Status: DC | PRN

## 2023-08-18 MED ORDER — ONDANSETRON HCL 4 MG/2ML IJ SOLN: 4.0000 mg | Freq: Four times a day (QID) | INTRAMUSCULAR | Status: DC | PRN

## 2023-08-18 MED ORDER — MORPHINE SULFATE (PF) 2 MG/ML IV SOLN
2.0000 mg | INTRAVENOUS | Status: DC | PRN
  Administered 2023-08-19 – 2023-08-21 (×2): 2 mg via INTRAVENOUS
  Filled 2023-08-18 (×2): qty 1

## 2023-08-18 MED ORDER — POLYVINYL ALCOHOL 1.4 % OP SOLN: 1.0000 [drp] | Freq: Four times a day (QID) | OPHTHALMIC | Status: DC | PRN

## 2023-08-18 NOTE — Progress Notes (Signed)
 Occupational Therapy Treatment Patient Details Name: Steven Meadows MRN: [id number] DOB: May 20, 1931 Today's Date: 08/18/2023   History of present illness Steven Meadows is a 89 y.o. male admitted 08/12/23 for acute encephalopathy. Pt presented after found unresponsive at his assisted living facility and was noted to be hypoxic. EEG showed five seizures without clinical signs arising from right hemisphere lasting about 2 minutes each, epileptogenicity arising from right hemisphere, maximal right posterior quadrant, and moderate diffuse encephalopathy. This EEG pattern is on the ictal-interictal continuum with high risk of seizure recurrence. MRI brain demonstrated subtle diffusion signal abnormality in right hippocampus and right dorsal thalamus. Findings are concerning for acute to early subacute infarcts. PMH significant for HTN, HLD, atrial fibrillation, CVA, intracerebral hemorrhage, renal disease, and encephalitis at age 11.   OT comments  Pt continues to be limited by lethargy but with bed mobility and EOB attempts today, pt with consistent eye opening and intermittent command following (could also be impacted by [facility]). Pt able to briefly gaze to R and L side to look at therapy and family when cued. Overall, pt does require Total A x 2 for all bed mobility attempts and peri care provided when BM noted on entry. Elevated BUE at end of session and encouraged family to assist with BUE ROM within their comfort levels.  HR to 120s with activity, SpO2 WFL on 4 L O2      If plan is discharge home, recommend the following:  A lot of help with walking and/or transfers;Two people to help with walking and/or transfers;A lot of help with bathing/dressing/bathroom;Two people to help with bathing/dressing/bathroom   Equipment Recommendations  Hospital bed    Recommendations for Other Services      Precautions / Restrictions Precautions Precautions: Fall Recall of Precautions/Restrictions:  Impaired Restrictions Weight Bearing Restrictions Per Provider Order: No       Mobility Bed Mobility Overal bed mobility: Needs Assistance Bed Mobility: Rolling, Sit to Supine, Supine to Sit Rolling: Total assist, +2 for physical assistance, +2 for safety/equipment   Supine to sit: Total assist, +2 for physical assistance, +2 for safety/equipment Sit to supine: Total assist, +2 for physical assistance, +2 for safety/equipment   General bed mobility comments: Overall Total A x 2 due to decreased arousal. With rolling in bed for peri care, pt did have eyes open and demo attempts to reach for bedrails.    Transfers                         Balance Overall balance assessment: Needs assistance, History of Falls Sitting-balance support: Bilateral upper extremity supported, Feet supported Sitting balance-Leahy Scale: Poor Sitting balance - Comments: posterior bias. Pt able to assist some to push self up after WB through B elbows EOB. variable levels of balance assist needed Postural control: Posterior lean                                 ADL either performed or assessed with clinical judgement   ADL Overall ADL's : Needs assistance/impaired                             Toileting- Clothing Manipulation and Hygiene: Total assistance;Bed level;+2 for physical assistance;+2 for safety/equipment Toileting - Clothing Manipulation Details (indicate cue type and reason): +2 assist for rolling/BM cleanup and fresh pad placed. Notified NT that  full linen change needed            Extremity/Trunk Assessment Upper Extremity Assessment Upper Extremity Assessment: Generalized weakness;Right hand dominant;LUE deficits/detail;RUE deficits/detail RUE Deficits / Details: PROM remains WFL, edema noted in UE LUE Deficits / Details: PROM remains WFL, edema noted in UE   Lower Extremity Assessment Lower Extremity Assessment: Defer to PT evaluation        Vision    Vision Assessment?: Vision impaired- to be further tested in functional context Additional Comments: Daughter at bedside asked pt to look at her, briefly looking to R first where OT was and then gaze flickered to L side to look at daughter.   Perception     Praxis     Communication Communication Communication: Impaired Factors Affecting Communication: Difficulty expressing self;Reduced clarity of speech;Hearing impaired   Cognition Arousal: Obtunded, Lethargic Behavior During Therapy: Flat affect Cognition: Difficult to assess Difficult to assess due to: Level of arousal           OT - Cognition Comments: sleeping when not stimulated. When family addressed pt, he did say "what", eyes opening consistently with rolling in bed/mobility. Intermittent command following likely impacted by [facility] When asked if pt was ready to lay down, pt did nod head yes.                 Following commands: Impaired Following commands impaired: Follows one step commands inconsistently, Follows one step commands with increased time      Cueing   Cueing Techniques: Verbal cues, Tactile cues  Exercises      Shoulder Instructions       General Comments Daughters at bedside    Pertinent Vitals/ Pain       Pain Assessment Pain Assessment: Faces Faces Pain Scale: Hurts a little bit Pain Location: generalized with mobility Pain Descriptors / Indicators: Moaning Pain Intervention(s): Monitored during session  Home Living                                          Prior Functioning/Environment              Frequency  Min 1X/week        Progress Toward Goals  OT Goals(current goals can now be found in the care plan section)  Progress towards OT goals: OT to reassess next treatment  Acute Rehab OT Goals Patient Stated Goal: family hopeful for improvements in alertness, seizure control OT Goal Formulation: With family Time For Goal Achievement:  08/30/23 Potential to Achieve Goals: Good ADL Goals Pt Will Perform Grooming: with mod assist;bed level Pt Will Perform Upper Body Bathing: with mod assist;bed level Additional ADL Goal #1: Pt to follow one step commands > 75% of the time during functional tasks Additional ADL Goal #2: Pt to sustain attention to functional tasks/ADLs > 1 minute to improve overall engagement  Plan      Co-evaluation                 AM-PAC OT "6 Clicks" Daily Activity     Outcome Measure   Help from another person eating meals?: Total Help from another person taking care of personal grooming?: Total Help from another person toileting, which includes using toliet, bedpan, or urinal?: Total Help from another person bathing (including washing, rinsing, drying)?: Total Help from another person to put on and taking off regular upper body  clothing?: Total Help from another person to put on and taking off regular lower body clothing?: Total 6 Click Score: 6    End of Session Equipment Utilized During Treatment: Oxygen  OT Visit Diagnosis: Other abnormalities of gait and mobility (R26.89);Unsteadiness on feet (R26.81);Muscle weakness (generalized) (M62.81);Other symptoms and signs involving cognitive function   Activity Tolerance Patient limited by lethargy   Patient Left in bed;with call bell/phone within reach;with bed alarm set;with family/visitor present   Nurse Communication Other (comment) (NT- bed linen change)        Time: 9147-8295 OT Time Calculation (min): 24 min  Charges: OT General Charges $OT Visit: 1 Visit OT Treatments $Therapeutic Activity: 8-22 mins  Steven Meadows, OTR/L Acute Rehab Services Office: [phone]   Steven Meadows 08/18/2023, 11:51 AM

## 2023-08-18 NOTE — Progress Notes (Signed)
 SLP Cancellation Note  Patient Details Name: Steven Meadows MRN: [id number] DOB: [birth year]   Cancelled treatment:        Attempted to see pt for ongoing dysphagia management.  Pt working with PT/OT at time of attempt.  Daughters Festus and [place] in room.  Spoke briefly about planned transition to hospice care and provided education regarding comfort feeds as desired.  Recommend continuing at least spoonfuls of water to help thin and mobilize secretions.  SLP will sign off at this time.    Elester Grim , MA, CCC-SLP Acute Rehabilitation Services Office: [phone] 08/18/2023, 11:53 AM

## 2023-08-18 NOTE — Progress Notes (Signed)
 Physical Therapy Treatment Patient Details Name: Steven Meadows MRN: [id number] DOB: 30-Sep-1931 Today's Date: 08/18/2023   History of Present Illness Steven Meadows is a 89 y.o. male admitted 08/12/23 for acute encephalopathy. Pt presented after found unresponsive at his assisted living facility and was noted to be hypoxic. EEG showed five seizures without clinical signs arising from right hemisphere lasting about 2 minutes each, epileptogenicity arising from right hemisphere, maximal right posterior quadrant, and moderate diffuse encephalopathy. This EEG pattern is on the ictal-interictal continuum with high risk of seizure recurrence. MRI brain demonstrated subtle diffusion signal abnormality in right hippocampus and right dorsal thalamus. Findings are concerning for acute to early subacute infarcts. PMH significant for HTN, HLD, atrial fibrillation, CVA, intracerebral hemorrhage, renal disease, and encephalitis at age 6.    PT Comments  A little more alert once pt assisted to mobilize. Worked on rolling and transitions to and from edge of bed. Required total assist +2. Practiced weight shifting onto Lt and Rt elbows - pt able to engage lightly to assist with return to midline from Lt lean but no engagement when rising from Rt side. Incontinent of stool, assisted with peri-care. Sats mid 90s on 4L throughout session. Patient will continue to benefit from skilled physical therapy services to further improve independence with functional mobility.     If plan is discharge home, recommend the following: Two people to help with walking and/or transfers;Two people to help with bathing/dressing/bathroom;Assistance with cooking/housework;Assistance with feeding;Direct supervision/assist for medications management;Direct supervision/assist for financial management;Assist for transportation;Help with stairs or ramp for entrance;Supervision due to cognitive status   Can travel by private vehicle     No   Equipment Recommendations  Wheelchair (measurements PT);Wheelchair cushion (measurements PT);Hospital bed;Hoyer lift    Recommendations for Other Services       Precautions / Restrictions Precautions Precautions: Fall Recall of Precautions/Restrictions: Impaired Restrictions Weight Bearing Restrictions Per Provider Order: No     Mobility  Bed Mobility Overal bed mobility: Needs Assistance Bed Mobility: Rolling, Sit to Supine, Supine to Sit Rolling: Total assist, +2 for physical assistance, +2 for safety/equipment   Supine to sit: Total assist, +2 for physical assistance, +2 for safety/equipment Sit to supine: Total assist, +2 for physical assistance, +2 for safety/equipment   General bed mobility comments: Overall Total A + 2 due to decreased arousal and initiation. With rolling in bed for peri care, pt did have eyes open and demo attempts to reach for bedrails. hand over hand guidance to assist with reach but would not hold rail once in place. Assisted with peri-care.    Transfers                   General transfer comment: Deferred for safety.    Ambulation/Gait               General Gait Details: Per family, pt non-ambulatory   Stairs             Wheelchair Mobility     Tilt Bed    Modified Rankin (Stroke Patients Only)       Balance Overall balance assessment: Needs assistance, History of Falls Sitting-balance support: Bilateral upper extremity supported, Feet supported Sitting balance-Leahy Scale: Poor Sitting balance - Comments: posterior bias. Pt able to assist some to push self up after WB through B elbows EOB. variable levels of balance assist needed. Better strength pushing from Lt elbow into midline but still max A Postural control: Posterior lean  Communication Communication Communication: Impaired Factors Affecting Communication: Difficulty expressing self;Reduced clarity of  speech;Hearing impaired  Cognition Arousal: Obtunded, Lethargic Behavior During Therapy: Flat affect   PT - Cognitive impairments: Difficult to assess Difficult to assess due to: Level of arousal                       Following commands: Impaired Following commands impaired: Follows one step commands inconsistently, Follows one step commands with increased time    Cueing Cueing Techniques: Verbal cues, Tactile cues, Visual cues  Exercises Other Exercises Other Exercises: Weight shifting onto Lt and Rt elbows with multimodal cues to facilitate return to midline. Max A from left lean, Total from Rt.    General Comments General comments (skin integrity, edema, etc.): Assisted peri-care pt incontinent of stool      Pertinent Vitals/Pain Pain Assessment Pain Assessment: PAINAD Faces Pain Scale: Hurts a little bit Breathing: normal Negative Vocalization: occasional moan/groan, low speech, negative/disapproving quality Facial Expression: smiling or inexpressive Body Language: relaxed Consolability: no need to console PAINAD Score: 1 Pain Location: generalized with mobility Pain Descriptors / Indicators: Moaning Pain Intervention(s): Monitored during session, Repositioned    Home Living                          Prior Function            PT Goals (current goals can now be found in the care plan section) Acute Rehab PT Goals Patient Stated Goal: Unable to state PT Goal Formulation: Patient unable to participate in goal setting Time For Goal Achievement: 08/28/23 Potential to Achieve Goals: Fair Progress towards PT goals: Progressing toward goals    Frequency    Min 1X/week      PT Plan      Co-evaluation PT/OT/SLP Co-Evaluation/Treatment: Yes Reason for Co-Treatment: Complexity of the patient's impairments (multi-system involvement);Necessary to address cognition/behavior during functional activity;For patient/therapist safety;To address  functional/ADL transfers PT goals addressed during session: Mobility/safety with mobility;Balance        AM-PAC PT "6 Clicks" Mobility   Outcome Measure  Help needed turning from your back to your side while in a flat bed without using bedrails?: Total Help needed moving from lying on your back to sitting on the side of a flat bed without using bedrails?: Total Help needed moving to and from a bed to a chair (including a wheelchair)?: Total Help needed standing up from a chair using your arms (e.g., wheelchair or bedside chair)?: Total Help needed to walk in hospital room?: Total Help needed climbing 3-5 steps with a railing? : Total 6 Click Score: 6    End of Session Equipment Utilized During Treatment: Oxygen Activity Tolerance: Patient limited by lethargy Patient left: in bed;with call bell/phone within reach;with bed alarm set;with family/visitor present (Prevalon boots in place)   PT Visit Diagnosis: Muscle weakness (generalized) (M62.81);Other abnormalities of gait and mobility (R26.89);Difficulty in walking, not elsewhere classified (R26.2)     Time: 1610-9604 PT Time Calculation (min) (ACUTE ONLY): 24 min  Charges:    $Therapeutic Activity: 8-22 mins                       Kathlyn Sacramento, PT, DPT [facility]  Rehabilitation Services Physical Therapist Office: [phone] Website: [email or web address]    Berton Mount 08/18/2023, 1:18 PM

## 2023-08-18 NOTE — Progress Notes (Signed)
 PROGRESS NOTE                                                                                                                                                                                                             Patient Demographics:    Steven Meadows, is a 89 y.o. male, DOB - [birth year], ZOX:[id number]  Outpatient Primary MD for the patient is Pcp, No    LOS - 6  Admit date - 08/12/2023    No chief complaint on file.      Brief Narrative (HPI from H&P)   89 y.o. male with past medical history significant of hypertension, hyperlipidemia, atrial fibrillation, stroke, intracerebral hemorrhage, renal disease presented to hospital after found to be unresponsive at his assisted living facility.  He had been having progressive weakness recently and was found facedown on the floor.  Her workup in the ER was suggestive of possible acute and subacute stroke, ongoing seizures, suspicious for meningitis/encephalitis and he was admitted to the hospital, neurology was consulted.   Subjective:   Patient in bed, slightly more arousable morning of 08/18/2023, more alert, denies any headache or chest pain.   Assessment  & Plan :   Acute metabolic encephalopathy with sepsis present on admission.   Recently moved to assisted living facility where he was exposed by a sick contact diagnosed with herpes, subsequently found unresponsive by staff at assisted living facility.  Workup in the hospital has so far revealed acute to subacute strokes on MRI, ongoing seizures on EEG suspicious for status epilepticus, leukocytosis, near obtunded state.  Repeat MRI 08/17/2023 nonacute.  Case discussed with neurology in detail on 08/15/2023, LP showed CSF not consistent with infection, MRI changes believe more likely due to prolonged seizure versus stroke.  Currently on AED - Lacosamide 200mg  BID , mentation gradually improving, de-escalated antibiotics on  08/15/2023.  His repeat MRI on 08/17/2023 does not show any stroke, mental status although slowly but is improving, family to talk to palliative care today about long-term goals of care.  For now continue supportive care with PT, OT and speech follow-up, gentle IV fluids, for now cleared for liquid diet.  Previous AEDS: Keppra - discontinued due to somnolence Depakote- discontinued due to elevated LFTS  Sepsis clinically ruled out, leukocytosis and fever likely reactionary from status epilepticus, de-escalate antibiotics to Unasyn, accounting for  mild clinical aspiration while he was seizing.  Follow cultures.  1 out of 2 blood cultures with staph epidermis contamination.  Respiratory viral panel negative.  History of CVA  MRI noted, acute to subacute strokes, echocardiogram stable, continue heparin drip for underlying A-fib, PT OT and speech eval, continue combination of Crestor and Zetia.   History of atrial fibrillation.  Has been started on heparin drip.  Was on Eliquis at home.  Resume Eliquis once p.o. intake improves.   Hypertension as needed Cardizem.   Hypokalemia.  Replaced.    Hyperlipidemia  -Home Zetia continue at home dose, Crestor dose will be increased once he is taking oral on hold.  LDL was above goal.  Mild bilateral upper extremity swelling.  Upper extremity venous duplex bilateral negative.  Keep upper extremities elevated.  Asymptomatic transaminitis.  Likely due to Depakote, right upper quadrant ultrasound and hepatitis panel unremarkable, LFTs trending down.  Continue to monitor.      Condition - Extremely Guarded  Family Communication  : Updated daughter in detail on 08/14/2023, family member bedside 08/15/2022, updated wife over the phone on 08/16/2023, daughter Haskell Linker on 08/16/2023, daughter and son-in-law bedside on 08/17/2023  Code Status : DNR  Consults  : Neurology, palliative care  PUD Prophylaxis : PPI   Procedures  :     Right arm PICC line placement on  08/16/2023.    MRI brain.   subtle diffusion signal abnormality in the right hippocampus with areas of restricted diffusion noted within the body and tail of the hippocampus. Additional subtle diffusion signal abnormality and restricted diffusion involving the right dorsal thalamus. Findings are concerning for acute to early subacute infarcts.   CTA head and neck.   CTA neck: 1. The common carotid and internal carotid arteries are patent within the neck without hemodynamically significant stenosis. Atherosclerotic plaque about the carotid bifurcations and within the proximal internal carotid arteries, bilaterally. 2. The left vertebral artery is non-dominant and developmentally diminutive, but patent throughout the neck. 3. The dominant right vertebral artery is patent within the neck. Non-stenotic atherosclerotic plaque at the origin of this vessel. 4. Aortic Atherosclerosis (ICD10-I70.0).   CTA head: 1. No proximal intracranial large vessel occlusion identified. 2. Intracranial atherosclerotic disease with multifocal stenoses, most notably as follows. 3. Severe stenosis within a left PCA branch at the P2/P3 junction, new from prior exams. 4. Sites of up to moderate stenosis within the intracranial right vertebral artery. 5. Up to moderate stenosis of the supraclinoid right internal carotid artery. 6. 7-8 mm aneurysm at the vertebrobasilar junction on the right, unchanged in size from the CTA of 04/30/2020  EEG.  Positive for seizures.    Echocardiogram.  1. Left ventricular ejection fraction, by estimation, is 60 to 65%. The left ventricle has normal function. The left ventricle has no regional wall motion abnormalities. Left ventricular diastolic parameters are indeterminate.  2. Right ventricular systolic function is moderately reduced. The right ventricular size is moderately enlarged. There is normal pulmonary artery systolic pressure. The estimated right ventricular systolic pressure is 33.5 mmHg.   3. Left atrial size was severely dilated.  4. Right atrial size was severely dilated.  5. The mitral valve was not well visualized. Trivial mitral valve regurgitation. Moderate mitral annular calcification.  6. The aortic valve is tricuspid. Aortic valve regurgitation is not visualized. Aortic valve sclerosis/calcification is present, without any evidence of aortic stenosis.  7. Aortic dilatation noted. There is mild dilatation of the ascending aorta.  8. The  inferior vena cava is dilated in size with <50% respiratory variability, suggesting right atrial pressure of 15 mmHg  LP -CSF not consistent with infection.      Disposition Plan  :    Status is: Inpatient  DVT Prophylaxis  :  SCDs/Hep gtt  Lab Results  Component Value Date   PLT 167 08/18/2023    Diet :  Diet Order             Diet clear liquid Room service appropriate? Yes; Fluid consistency: Thin  Diet effective now                    Inpatient Medications  Scheduled Meds:  Chlorhexidine Gluconate Cloth  6 each Topical Daily   pantoprazole (PROTONIX) IV  40 mg Intravenous Q24H   sodium chloride flush  10-40 mL Intracatheter Q12H   sodium chloride flush  3 mL Intravenous Q12H   Continuous Infusions:  ampicillin-sulbactam (UNASYN) IV 3 g (08/18/23 0442)   dextrose 5 % and 0.45 % NaCl with KCl 20 mEq/L 75 mL/hr at 08/18/23 0433   heparin 1,400 Units/hr (08/18/23 0030)   lacosamide (VIMPAT) IV 200 mg (08/17/23 2201)   PRN Meds:.acetaminophen **OR** acetaminophen, diltiazem, LORazepam, polyethylene glycol    Objective:   Vitals:   08/17/23 1800 08/17/23 2000 08/17/23 2312 08/18/23 0358  BP: (!) 141/90 (!) 149/93 (!) 154/90 (!) 143/92  Pulse: (!) 101 96    Resp: 16 (!) 25    Temp:  (!) 97.5 F (36.4 C) 98.2 F (36.8 C) 97.8 F (36.6 C)  TempSrc:  Oral Oral Axillary  SpO2: 97% 94%    Weight:      Height:        Wt Readings from Last 3 Encounters:  08/13/23 90 kg  01/19/22 90.7 kg  06/10/21 98.9 kg      Intake/Output Summary (Last 24 hours) at 08/18/2023 0758 Last data filed at 08/18/2023 0400 Gross per 24 hour  Intake 20 ml  Output 1950 ml  Net -1930 ml     Physical Exam  Sleeping but arousable, more responsive this morning, answer to few basic questions, followed few basic commands, no new F.N deficits, right arm PICC line in place [place].AT,PERRAL Supple Neck, No JVD,   Symmetrical Chest wall movement, Good air movement bilaterally, CTAB RRR,No Gallops,Rubs or new Murmurs,  +ve B.Sounds, Abd Soft, No tenderness,   No Cyanosis, Clubbing or edema      Data Review:    Recent Labs  Lab 08/12/23 1037 08/12/23 1044 08/14/23 1003 08/15/23 0556 08/16/23 0320 08/17/23 0518 08/18/23 0349  WBC 21.4*   < > 12.0* 9.9 9.0 8.4 9.3  HGB 15.7   < > 12.8* 11.8* 12.1* 12.2* 12.3*  HCT 48.0   < > 37.4* 35.7* 36.6* 36.6* 37.1*  PLT 246   < > 158 131* 143* 165 167  MCV 87.6   < > 85.8 85.6 87.1 86.9 87.5  MCH 28.6   < > 29.4 28.3 28.8 29.0 29.0  MCHC 32.7   < > 34.2 33.1 33.1 33.3 33.2  RDW 13.5   < > 13.9 13.7 13.6 13.7 13.6  LYMPHSABS 1.0  --   --  1.1 1.2 1.3 1.1  MONOABS 2.7*  --   --  1.9* 1.5* 1.4* 1.6*  EOSABS 0.0  --   --  0.1 0.1 0.2 0.2  BASOSABS 0.0  --   --  0.0 0.0 0.0 0.0   < > = values  in this interval not displayed.    Recent Labs  Lab 08/12/23 1037 08/12/23 1044 08/12/23 1200 08/12/23 1412 08/13/23 0406 08/14/23 0541 08/14/23 0609 08/14/23 1003 08/15/23 0556 08/16/23 0320 08/17/23 0518 08/18/23 0349  NA 133*   < >  --   --  134* 134*  --   --  133* 134* 134* 137  K 4.9   < >  --   --  3.7 3.2*  --   --  2.9* 2.9* 3.5 3.6  CL 97*   < >  --   --  99 100  --   --  99 100 101 104  CO2 18*  --   --   --  22 23  --   --  24 25 26 25   ANIONGAP 18*  --   --   --  13 11  --   --  10 9 7 8   GLUCOSE 169*   < >  --   --  150* 112*  --   --  109* 105* 81 92  BUN 15   < >  --   --  14 16  --   --  12 11 13 13   CREATININE 1.50*   < >  --   --  1.18 1.08  --   --   0.87 0.84 0.83 0.86  AST 49*  --   --   --  266*  --   --   --  218* 200* 174* 131*  ALT 31  --   --   --  61*  --   --   --  57* 60* 55* 51*  ALKPHOS 31*  --   --   --  24*  --   --   --  19* 19* 18* 18*  BILITOT 0.5  --   --   --  1.0  --   --   --  0.8 0.8 0.7 0.8  ALBUMIN 4.2  --   --   --  3.2*  --   --   --  2.4* 2.3* 2.1* 2.0*  CRP  --   --   --   --   --   --   --  11.1* 7.2* 6.9* 9.9* 7.7*  PROCALCITON  --   --   --   --   --   --  0.28  --  0.18 0.12 0.10 <0.10  LATICACIDVEN  --   --  3.5* 3.8*  --   --   --   --   --   --   --   --   INR 1.3*  --   --   --   --   --   --   --   --   --   --   --   BNP  --   --   --   --   --   --   --  295.7* 298.3* 473.2* 369.2* 295.0*  MG  --   --   --   --   --  1.8  --   --  1.7 1.6* 1.9 1.8  PHOS  --   --   --   --   --   --   --   --  2.1* 1.9* 2.7 2.6  CALCIUM 9.1  --   --   --  8.6* 8.3*  --   --  7.8* 7.8* 7.7*  7.8*   < > = values in this interval not displayed.      Recent Labs  Lab 08/12/23 1037 08/12/23 1200 08/12/23 1412 08/13/23 0406 08/14/23 0541 08/14/23 0609 08/14/23 1003 08/15/23 0556 08/16/23 0320 08/17/23 0518 08/18/23 0349  CRP  --   --   --   --   --   --  11.1* 7.2* 6.9* 9.9* 7.7*  PROCALCITON  --   --   --   --   --  0.28  --  0.18 0.12 0.10 <0.10  LATICACIDVEN  --  3.5* 3.8*  --   --   --   --   --   --   --   --   INR 1.3*  --   --   --   --   --   --   --   --   --   --   BNP  --   --   --   --   --   --  295.7* 298.3* 473.2* 369.2* 295.0*  MG  --   --   --   --  1.8  --   --  1.7 1.6* 1.9 1.8  CALCIUM 9.1  --   --    < > 8.3*  --   --  7.8* 7.8* 7.7* 7.8*   < > = values in this interval not displayed.    --------------------------------------------------------------------------------------------------------------- Lab Results  Component Value Date   CHOL 132 12/08/2020   HDL 26 (L) 12/08/2020   LDLCALC 98 12/08/2020   TRIG 39 12/08/2020   CHOLHDL 5.1 12/08/2020    Lab Results  Component Value  Date   HGBA1C 6.0 (H) 12/08/2020      Micro Results Recent Results (from the past 240 hours)  Culture, blood (routine x 2)     Status: None   Collection Time: 08/12/23 11:22 AM   Specimen: BLOOD LEFT HAND  Result Value Ref Range Status   Specimen Description BLOOD LEFT HAND  Final   Special Requests   Final    BOTTLES DRAWN AEROBIC AND ANAEROBIC Blood Culture results may not be optimal due to an inadequate volume of blood received in culture bottles   Culture   Final    NO GROWTH 5 DAYS Performed at [facility], [address], [place], [place] [address]    Report Status 08/17/2023 FINAL  Final  Resp panel by RT-PCR (RSV, Flu A&B, Covid) Anterior Nasal Swab     Status: None   Collection Time: 08/12/23 11:26 AM   Specimen: Anterior Nasal Swab  Result Value Ref Range Status   SARS Coronavirus 2 by RT PCR NEGATIVE NEGATIVE Final   Influenza A by PCR NEGATIVE NEGATIVE Final   Influenza B by PCR NEGATIVE NEGATIVE Final    Comment: (NOTE) The Xpert Xpress SARS-CoV-2/FLU/RSV plus assay is intended as an aid in the diagnosis of influenza from Nasopharyngeal swab specimens and should not be used as a sole basis for treatment. Nasal washings and aspirates are unacceptable for Xpert Xpress SARS-CoV-2/FLU/RSV testing.  Fact Sheet for Patients: BloggerCourse.com  Fact Sheet for Healthcare Providers: SeriousBroker.it  This test is not yet approved or cleared by the [place]  FDA and has been authorized for detection and/or diagnosis of SARS-CoV-2 by FDA under an Emergency Use Authorization (EUA). This EUA will remain in effect (meaning this test can be used) for the duration of the COVID-19 declaration under Section 564(b)(1) of the Act, 21  U.S.C. section 360bbb-3(b)(1), unless the authorization is terminated or revoked.     Resp Syncytial Virus by PCR NEGATIVE NEGATIVE Final    Comment: (NOTE) Fact Sheet for  Patients: BloggerCourse.com  Fact Sheet for Healthcare Providers: SeriousBroker.it  This test is not yet approved or cleared by the [place] FDA and has been authorized for detection and/or diagnosis of SARS-CoV-2 by FDA under an Emergency Use Authorization (EUA). This EUA will remain in effect (meaning this test can be used) for the duration of the COVID-19 declaration under Section 564(b)(1) of the Act, 21 U.S.C. section 360bbb-3(b)(1), unless the authorization is terminated or revoked.  Performed at [facility], [address] [place], [place] [address]   Culture, blood (routine x 2)     Status: Abnormal   Collection Time: 08/12/23 11:55 AM   Specimen: BLOOD LEFT ARM  Result Value Ref Range Status   Specimen Description BLOOD LEFT ARM  Final   Special Requests   Final    BOTTLES DRAWN AEROBIC AND ANAEROBIC Blood Culture adequate volume   Culture  Setup Time   Final    GRAM POSITIVE COCCI ANAEROBIC BOTTLE ONLY CRITICAL RESULT CALLED TO, READ BACK BY AND VERIFIED WITH: PHARMD Francie Massing on [phone] @0755  by SM    Culture (A)  Final    STAPHYLOCOCCUS EPIDERMIDIS THE SIGNIFICANCE OF ISOLATING THIS ORGANISM FROM A SINGLE SET OF BLOOD CULTURES WHEN MULTIPLE SETS ARE DRAWN IS UNCERTAIN. PLEASE NOTIFY THE MICROBIOLOGY DEPARTMENT WITHIN ONE WEEK IF SPECIATION AND SENSITIVITIES ARE REQUIRED. Performed at [facility], [address] [place], [place] [address]    Report Status 08/15/2023 FINAL  Final  Blood Culture ID Panel (Reflexed)     Status: Abnormal   Collection Time: 08/12/23 11:55 AM  Result Value Ref Range Status   Enterococcus faecalis NOT DETECTED NOT DETECTED Final   Enterococcus Faecium NOT DETECTED NOT DETECTED Final   Listeria monocytogenes NOT DETECTED NOT DETECTED Final   Staphylococcus species DETECTED (A) NOT DETECTED Final    Comment: CRITICAL RESULT CALLED TO, READ BACK BY AND VERIFIED  WITH: PHARMD Bailey A on [phone] @0755  by SM    Staphylococcus aureus (BCID) NOT DETECTED NOT DETECTED Final   Staphylococcus epidermidis DETECTED (A) NOT DETECTED Final    Comment: Methicillin (oxacillin) resistant coagulase negative staphylococcus. Possible blood culture contaminant (unless isolated from more than one blood culture draw or clinical case suggests pathogenicity). No antibiotic treatment is indicated for blood  culture contaminants. CRITICAL RESULT CALLED TO, READ BACK BY AND VERIFIED WITH: PHARMD Bailey A on [phone] @0755  by SM    Staphylococcus lugdunensis NOT DETECTED NOT DETECTED Final   Streptococcus species NOT DETECTED NOT DETECTED Final   Streptococcus agalactiae NOT DETECTED NOT DETECTED Final   Streptococcus pneumoniae NOT DETECTED NOT DETECTED Final   Streptococcus pyogenes NOT DETECTED NOT DETECTED Final   A.calcoaceticus-baumannii NOT DETECTED NOT DETECTED Final   Bacteroides fragilis NOT DETECTED NOT DETECTED Final   Enterobacterales NOT DETECTED NOT DETECTED Final   Enterobacter cloacae complex NOT DETECTED NOT DETECTED Final   Escherichia coli NOT DETECTED NOT DETECTED Final   Klebsiella aerogenes NOT DETECTED NOT DETECTED Final   Klebsiella oxytoca NOT DETECTED NOT DETECTED Final   Klebsiella pneumoniae NOT DETECTED NOT DETECTED Final   Proteus species NOT DETECTED NOT DETECTED Final   Salmonella species NOT DETECTED NOT DETECTED Final   Serratia marcescens NOT DETECTED NOT DETECTED Final   Haemophilus influenzae NOT DETECTED NOT DETECTED Final  Neisseria meningitidis NOT DETECTED NOT DETECTED Final   Pseudomonas aeruginosa NOT DETECTED NOT DETECTED Final   Stenotrophomonas maltophilia NOT DETECTED NOT DETECTED Final   Candida albicans NOT DETECTED NOT DETECTED Final   Candida auris NOT DETECTED NOT DETECTED Final   Candida glabrata NOT DETECTED NOT DETECTED Final   Candida krusei NOT DETECTED NOT DETECTED Final   Candida parapsilosis NOT DETECTED NOT  DETECTED Final   Candida tropicalis NOT DETECTED NOT DETECTED Final   Cryptococcus neoformans/gattii NOT DETECTED NOT DETECTED Final   Methicillin resistance mecA/C DETECTED (A) NOT DETECTED Final    Comment: CRITICAL RESULT CALLED TO, READ BACK BY AND VERIFIED WITH: PHARMD Francie Massing on [phone] @0755  by SM Performed at [facility], [address], [place], [place] [address]   MRSA Next Gen by PCR, Nasal     Status: None   Collection Time: 08/14/23  8:48 AM   Specimen: Nasal Mucosa; Nasal Swab  Result Value Ref Range Status   MRSA by PCR Next Gen NOT DETECTED NOT DETECTED Final    Comment: (NOTE) The GeneXpert MRSA Assay (FDA approved for NASAL specimens only), is one component of a comprehensive MRSA colonization surveillance program. It is not intended to diagnose MRSA infection nor to guide or monitor treatment for MRSA infections. Test performance is not FDA approved in patients less than 72 years old. Performed at [facility], [address] [place], [place] [address]   CSF culture w Gram Stain     Status: None   Collection Time: 08/14/23  3:57 PM   Specimen: CSF; Cerebrospinal Fluid  Result Value Ref Range Status   Specimen Description CSF  Final   Special Requests NONE  Final   Gram Stain   Final    WBC PRESENT, PREDOMINANTLY MONONUCLEAR NO ORGANISMS SEEN CYTOSPIN SMEAR    Culture   Final    NO GROWTH 3 DAYS Performed at [facility], [address], [place], [place] [address]    Report Status 08/17/2023 FINAL  Final  Culture, fungus without smear     Status: None (Preliminary result)   Collection Time: 08/14/23  3:57 PM   Specimen: CSF; Cerebrospinal Fluid  Result Value Ref Range Status   Specimen Description CSF  Final   Special Requests NONE  Final   Culture   Final    NO FUNGUS ISOLATED AFTER 3 DAYS Performed at [facility], [address], [place], [place] [address]    Report Status PENDING  Incomplete    Radiology Report MR  BRAIN WO CONTRAST Result Date: 08/17/2023 CLINICAL DATA:  Neuro deficit, acute, stroke suspected EXAM: MRI HEAD WITHOUT CONTRAST TECHNIQUE: Multiplanar, multiecho pulse sequences of the brain and surrounding structures were obtained without intravenous contrast. COMPARISON:  None Available. FINDINGS: Motion limited study.  Within this limitation: Brain: No acute infarction, hemorrhage, hydrocephalus, extra-axial collection or mass lesion. Remote pontine infarct and remote lacunar infarcts in the thalami and basal ganglia. Patchy T2/FLAIR hyperintensities the white matter, compatible with chronic microvascular ischemic disease. Cerebral atrophy. Vascular: Major arterial flow voids are maintained skull base. Skull and upper cervical spine: Normal marrow signal. Sinuses/Orbits: Clear sinuses.  No acute orbital findings. Other: Small right mastoid effusion. IMPRESSION: 1. No evidence of acute intracranial abnormality on motion limited assessment. 2. Moderate chronic microvascular ischemic disease. 3.  Cerebral Atrophy (ICD10-G31.9). Electronically Signed   By: Feliberto Harts M.D.   On: 08/17/2023 23:05   DG Chest [facility] [address]  Result Date: 08/17/2023 CLINICAL DATA:  Shortness of breath. EXAM: PORTABLE CHEST 1 VIEW COMPARISON:  08/15/2023 FINDINGS: The cardio pericardial silhouette is enlarged. Interval progression of interstitial opacity in the right lung with bibasilar atelectasis or infiltrate in similar small bilateral pleural effusions. No acute bony abnormality. Right PICC line tip overlies the proximal SVC level. Telemetry leads overlie the chest. IMPRESSION: Interval progression of interstitial opacity in the right lung with persistent bibasilar atelectasis or infiltrate and similar small bilateral pleural effusions. Electronically Signed   By: Donnal Fusi M.D.   On: 08/17/2023 06:21   US  Abdomen Limited RUQ (LIVER/GB) Result Date: 08/16/2023 CLINICAL DATA:  Transaminitis EXAM: ULTRASOUND ABDOMEN  LIMITED RIGHT UPPER QUADRANT COMPARISON:  None Available. FINDINGS: Gallbladder: No visible stones. Slight gallbladder wall thickening at 4 mm. Negative sonographic Murphy's. Common bile duct: Diameter: Normal caliber, 5 mm. Liver: No focal lesion identified. Within normal limits in parenchymal echogenicity. Portal vein is patent on color Doppler imaging with normal direction of blood flow towards the liver. Other: None. IMPRESSION: No evidence of cholelithiasis or acute cholecystitis. No acute findings. Electronically Signed   By: Janeece Mechanic M.D.   On: 08/16/2023 19:10     Signature  -   Lynnwood Sauer M.D on 08/18/2023 at 7:58 AM   -  To page go to [email or web address]

## 2023-08-18 NOTE — Progress Notes (Signed)
 PHARMACY - ANTICOAGULATION CONSULT NOTE  Pharmacy Consult for Heparin (PTA Eliquis on hold) Indication: atrial fibrillation  Allergies  Allergen Reactions   Nabumetone Other (See Comments)    Gi upset   Ace Inhibitors Cough   Flonase [Fluticasone Propionate] Other (See Comments)    Causes nosebleeds    Patient Measurements: Height: 5\' 11"  (180.3 cm) Weight: 90 kg (198 lb 6.6 oz) IBW/kg (Calculated) : 75.3 HEPARIN DW (KG): 90 Temp: 97.8 F (36.6 C) (04/15 0358) Temp Source: Axillary (04/15 0358) BP: 143/92 (04/15 0358)  Labs: Recent Labs    08/16/23 0320 08/16/23 1400 08/17/23 0518 08/18/23 0349  HGB 12.1*  --  12.2* 12.3*  HCT 36.6*  --  36.6* 37.1*  PLT 143*  --  165 167  HEPARINUNFRC 0.42 0.37 0.43 0.38  CREATININE 0.84  --  0.83 0.86    Estimated Creatinine Clearance: 62 mL/min (by C-G formula based on SCr of 0.86 mg/dL).  Assessment: 89 YOM presenting with acute encephalopathy and concern for sepsis of unknown source of infection. PMH significant for atrial fibrillation and on Eliquis PTA, last dose given 4/8 PM. Eliquis held for LP and IV heparin begun on 4/9. Heparin held for LP on 4/11, then restarted on 4/12. Previously using aPTTs for monitoring, but now heparin levels due to correlating.  Eliquis remains on hold. NPO to thin diet. Also on Protonix and Vimpat IV.  IV Unasyn will complete on 4/17 am.  Heparin level remains therapeutic (0.38) on 1400 units/hr.  Hemoglobin stable. Platelet count normal on admit >>low 4/12 and 4/13>> back into normal range on 4/14.  Goal of Therapy:  Heparin level 0.3-0.7 units/ml Monitor platelets by anticoagulation protocol: Yes   Plan:  Continue heparin drip at 1400 units/hr Daily heparin level and CBC. Monitor for signs/symptoms of bleeding. Follow up for resuming PTA Eliquis when able.  Adolphus Akin, [place] 08/18/2023,9:40 AM

## 2023-08-18 NOTE — Progress Notes (Signed)
 NEUROLOGY CONSULT FOLLOW UP NOTE   Date of service: August 18, 2023 Patient Name: Steven Meadows MRN:  [id number] DOB:  1931-06-14  Brief HPI  Steven Meadows is a 89 y.o. male with a history of previous stroke, encephalitis at age 41, atrial fibrillation on Eliquis, recent decline who presented on 08/12/2023 with decreased responsiveness.  MRI and CTA have been negative on 4/9.  Routine EEG on 4/9 was negative.  He has an elevated white count and elevated fever to 102.2.  He was started on empiric CNS coverage, but LP on 4/11 was negative.  EEG on 4/9 was initially negative, but repeat continuous EEG from 4/10 to 4/11 revealed multiple seizures prior to being loaded with antiepileptics with no seizures following load, but continued periodic discharges.  This improved over the course of the study.  He had no seizures through 1045pm when he was disconnected.   Interval Hx/subjective   Concern for seizure today, on exam he initially wakes up with stimulation and states his name. He then begins repetatively blinking and does not respond to questions, gaze to the right. Family states he is less interactive today. Plan for palliative care meeting at 1430 today.  Vitals   Vitals:   08/17/23 1800 08/17/23 2000 08/17/23 2312 08/18/23 0358  BP: (!) 141/90 (!) 149/93 (!) 154/90 (!) 143/92  Pulse: (!) 101 96    Resp: 16 (!) 25    Temp:  (!) 97.5 F (36.4 C) 98.2 F (36.8 C) 97.8 F (36.6 C)  TempSrc:  Oral Oral Axillary  SpO2: 97% 94%    Weight:      Height:         Body mass index is 27.67 kg/m.  Physical Exam   General, in bed, NAD  Neurologic Examination   Gen: in bed, NAD  Neuro: MS: He is somnolent but arousable able to answer simple questions and follow commands, but very lethargic CN: When initially opening eyes gaze is to the right,does not cross midline. Repetitive blinking noted. Head is turned to the right as well. Hard of hearing at baseline  Motor: Withdraws in all extremities  and wiggles toes bilaterally. No spontaneous movement in bilateral upper extremities.  Endorses pain in the left arm. Both arms are edematous, left worse than right.  Sensory: He reports sensation bilaterally  Labs and Diagnostic Imaging   CBC:  Recent Labs  Lab 08/17/23 0518 08/18/23 0349  WBC 8.4 9.3  NEUTROABS 5.2 6.2  HGB 12.2* 12.3*  HCT 36.6* 37.1*  MCV 86.9 87.5  PLT 165 167    Basic Metabolic Panel:  Lab Results  Component Value Date   NA 137 08/18/2023   K 3.6 08/18/2023   CO2 25 08/18/2023   GLUCOSE 92 08/18/2023   BUN 13 08/18/2023   CREATININE 0.86 08/18/2023   CALCIUM 7.8 (L) 08/18/2023   GFRNONAA >60 08/18/2023   GFRAA 61 08/18/2019   Lipid Panel:  Lab Results  Component Value Date   LDLCALC 98 12/08/2020   HgbA1c:  Lab Results  Component Value Date   HGBA1C 6.0 (H) 12/08/2020   Urine Drug Screen:     Component Value Date/Time   LABOPIA NONE DETECTED 08/12/2023 1300   COCAINSCRNUR NONE DETECTED 08/12/2023 1300   LABBENZ NONE DETECTED 08/12/2023 1300   AMPHETMU NONE DETECTED 08/12/2023 1300   THCU NONE DETECTED 08/12/2023 1300   LABBARB NONE DETECTED 08/12/2023 1300     Imaging(Personally reviewed): MRI-he has subtle diffusion change in the RIGHT  medial temporal lobe CTA is negative Repeat MRI - stable subtle diffusion change in the RIGHT medial temporal lobe   08/14/2023 EEG This study showed five seizures without clinical signs were noted arising from right hemisphere on 08/13/2023 at 1249, 1358, 1423, 1442 and 1525 lasting about 2 minutes each.     Impression   Steven Meadows is a 89 y.o. male presenting with fever, altered mental status, focal neurological deficits.    He was found to have recurrent focal right hemispheric seizures.  On MRI, there is some subtle restricted diffusion change in the right hemisphere, I suspect that this is more likely related to seizure effect than stroke.  In any case, secondary prevention of stroke  would consist of anticoagulation, he is currently on a heparin infusion.   He has continued to be somnolent despite discontinuing Keppra.  Depakote was started, but with increasing LFTs this was changed to Lacosamide. Increased to 200mg  BID now.   Previous AEDS: Keppra - discontinued due to somnolence Depakote- discontinued due to elevated LFTS  Current AEDs: Lacosamide 200mg  BID  Given his overall comorbidities, gradual decline, family is planning to transition to comfort care at this time pending further confirmation with other family members not currently present at bedside.  Attending MD discussed with family that this is a very reasonable decision  Recommendations  Continue lacosamide to 200 mg twice daily; would not stop after transition to comfort care due to discomfort associated with seizures No indication for repeat EEG given transition to comfort care Neurology will sign off at this time given plan to transition to comfort care, please re-engage us  if further questions arise ______________________________________________________________________  Patient seen and examined by NP/APP with MD. MD to update note as needed.   Imogene Mana, DNP, FNP-BC [facility] Pager: [phone]  Attending Neurologist's note:  On my examination patient was sleeping, with Cheyne-Stokes respirations.  No response to voice or clear response to light touch.  Pupils equal round reactive when eyelids passively opened.  No noxious stimulation applied given goals of care  Discussed course and prognosis with family at bedside, and all of their questions were answered  I personally saw this patient, gathering history, performing a full neurologic examination, reviewing relevant labs, personally reviewing relevant imaging including MRI brain, and formulated the assessment and plan, adding the note above for completeness and clarity to accurately reflect my thoughts  Baldwin Levee  MD-PhD [facility] [phone] Available 7 AM to 7 PM, outside these hours please contact Neurologist on call listed on AMION

## 2023-08-19 ENCOUNTER — Encounter (HOSPITAL_COMMUNITY)

## 2023-08-19 DIAGNOSIS — G9340 Encephalopathy, unspecified: Secondary | ICD-10-CM | POA: Diagnosis not present

## 2023-08-19 DIAGNOSIS — Z515 Encounter for palliative care: Secondary | ICD-10-CM | POA: Diagnosis not present

## 2023-08-19 DIAGNOSIS — Z7189 Other specified counseling: Secondary | ICD-10-CM | POA: Diagnosis not present

## 2023-08-19 MED ORDER — GLYCOPYRROLATE 0.2 MG/ML IJ SOLN
0.4000 mg | INTRAMUSCULAR | Status: DC
  Administered 2023-08-19 – 2023-08-21 (×11): 0.4 mg via INTRAVENOUS
  Filled 2023-08-19 (×11): qty 2

## 2023-08-19 MED ORDER — KCL IN DEXTROSE-NACL 20-5-0.45 MEQ/L-%-% IV SOLN
INTRAVENOUS | Status: AC
  Filled 2023-08-19: qty 1000

## 2023-08-19 NOTE — Progress Notes (Signed)
 PROGRESS NOTE                                                                                                                                                                                                             Patient Demographics:    Steven Meadows, is a 89 y.o. male, DOB - 1931/09/19, ZOX:[id number]  Outpatient Primary MD for the patient is Pcp, No    LOS - 7  Admit date - 08/12/2023    No chief complaint on file.      Brief Narrative (HPI from H&P)     89 y.o. male with past medical history significant of hypertension, hyperlipidemia, atrial fibrillation, stroke, intracerebral hemorrhage, renal disease presented to hospital after found to be unresponsive at his assisted living facility.  He had been having progressive weakness recently and was found facedown on the floor.  Her workup in the ER was suggestive of possible acute and subacute stroke, ongoing seizures, suspicious for meningitis/encephalitis and he was admitted to the hospital, neurology was consulted.   Subjective:   Overall good night sleep, discussed with family at bedside, they noticed some apneic episodes, and has some upper airway gurgling   Assessment  & Plan :   Acute metabolic encephalopathy  Sepsis(ruled IN) present on admission due to aspiration pneumonia, right lung base Seizures Transaminitis Aspiration pneumonia History of CVA    History of atrial fibrillation.  Hypertension  Hypokalemia.   Hyperlipidemia  Pleural effusions - Patient was found to have recurrent focal right hemispheric seizures, medication has been adjusted by neurology, he is currently on IV Vimpat, as well likely sustained aspiration event during the seizures, overall poor life quality, significant deconditioning and multiple comorbidities, palliative medicine assistance greatly appreciated, patient currently with full comfort measures, to keep current interventions  including IV fluids, and IV seizure medications, with plan [facility] if he remains medically stable by then. - He is with apneic episodes, upper respiratory gurgling, his glycopyrrolate has been switched to scheduled      Condition -terminal  Family Communication  : Discussed with both son and daughter at bedside Code Status : DNR/comfort  Consults  : Neurology, palliative care   Procedures  :     Right arm PICC line placement on 08/16/2023.    MRI brain.   subtle diffusion  signal abnormality in the right hippocampus with areas of restricted diffusion noted within the body and tail of the hippocampus. Additional subtle diffusion signal abnormality and restricted diffusion involving the right dorsal thalamus. Findings are concerning for acute to early subacute infarcts.   CTA head and neck.   CTA neck: 1. The common carotid and internal carotid arteries are patent within the neck without hemodynamically significant stenosis. Atherosclerotic plaque about the carotid bifurcations and within the proximal internal carotid arteries, bilaterally. 2. The left vertebral artery is non-dominant and developmentally diminutive, but patent throughout the neck. 3. The dominant right vertebral artery is patent within the neck. Non-stenotic atherosclerotic plaque at the origin of this vessel. 4. Aortic Atherosclerosis (ICD10-I70.0).   CTA head: 1. No proximal intracranial large vessel occlusion identified. 2. Intracranial atherosclerotic disease with multifocal stenoses, most notably as follows. 3. Severe stenosis within a left PCA branch at the P2/P3 junction, new from prior exams. 4. Sites of up to moderate stenosis within the intracranial right vertebral artery. 5. Up to moderate stenosis of the supraclinoid right internal carotid artery. 6. 7-8 mm aneurysm at the vertebrobasilar junction on the right, unchanged in size from the CTA of 04/30/2020  EEG.  Positive for seizures.     Echocardiogram.  1. Left ventricular ejection fraction, by estimation, is 60 to 65%. The left ventricle has normal function. The left ventricle has no regional wall motion abnormalities. Left ventricular diastolic parameters are indeterminate.  2. Right ventricular systolic function is moderately reduced. The right ventricular size is moderately enlarged. There is normal pulmonary artery systolic pressure. The estimated right ventricular systolic pressure is 33.5 mmHg.  3. Left atrial size was severely dilated.  4. Right atrial size was severely dilated.  5. The mitral valve was not well visualized. Trivial mitral valve regurgitation. Moderate mitral annular calcification.  6. The aortic valve is tricuspid. Aortic valve regurgitation is not visualized. Aortic valve sclerosis/calcification is present, without any evidence of aortic stenosis.  7. Aortic dilatation noted. There is mild dilatation of the ascending aorta.  8. The inferior vena cava is dilated in size with <50% respiratory variability, suggesting right atrial pressure of 15 mmHg  LP -CSF not consistent with infection.      Disposition Plan  :    Status is: Inpatient  DVT Prophylaxis  :  SCDs/Hep gtt  Lab Results  Component Value Date   PLT 167 08/18/2023    Diet :  Diet Order             Diet clear liquid Room service appropriate? Yes; Fluid consistency: Thin  Diet effective now                    Inpatient Medications  Scheduled Meds:  glycopyrrolate  0.4 mg Intravenous Q4H   pantoprazole (PROTONIX) IV  40 mg Intravenous Q24H   Continuous Infusions:  lacosamide (VIMPAT) IV 200 mg (08/18/23 2257)   PRN Meds:.acetaminophen **OR** acetaminophen, antiseptic oral rinse, LORazepam, morphine injection, ondansetron **OR** ondansetron (ZOFRAN) IV, polyethylene glycol, polyvinyl alcohol    Objective:   Vitals:   08/18/23 0358 08/18/23 0802 08/18/23 1600 08/19/23 0400  BP: (!) 143/92 137/86 (!) 141/92 (!) 153/102   Pulse:  89 94 (!) 104  Resp:  (!) 22 16 (!) 21  Temp: 97.8 F (36.6 C) 97.9 F (36.6 C)  98.9 F (37.2 C)  TempSrc: Axillary Axillary  Axillary  SpO2:  94% 98% 97%  Weight:      Height:  Wt Readings from Last 3 Encounters:  08/13/23 90 kg  01/19/22 90.7 kg  06/10/21 98.9 kg     Intake/Output Summary (Last 24 hours) at 08/19/2023 1005 Last data filed at 08/18/2023 2100 Gross per 24 hour  Intake 2839.26 ml  Output 800 ml  Net 2039.26 ml     Physical Exam  Chronically ill-appearing, appears comfortable, somnolent 4 L nasal cannula, has intermittent episodes of apnea but comfortable Abdomen soft Irregular irregular Extremity with anasarca, but no cyanosis     Data Review:    Recent Labs  Lab 08/12/23 1037 08/12/23 1044 08/14/23 1003 08/15/23 0556 08/16/23 0320 08/17/23 0518 08/18/23 0349  WBC 21.4*   < > 12.0* 9.9 9.0 8.4 9.3  HGB 15.7   < > 12.8* 11.8* 12.1* 12.2* 12.3*  HCT 48.0   < > 37.4* 35.7* 36.6* 36.6* 37.1*  PLT 246   < > 158 131* 143* 165 167  MCV 87.6   < > 85.8 85.6 87.1 86.9 87.5  MCH 28.6   < > 29.4 28.3 28.8 29.0 29.0  MCHC 32.7   < > 34.2 33.1 33.1 33.3 33.2  RDW 13.5   < > 13.9 13.7 13.6 13.7 13.6  LYMPHSABS 1.0  --   --  1.1 1.2 1.3 1.1  MONOABS 2.7*  --   --  1.9* 1.5* 1.4* 1.6*  EOSABS 0.0  --   --  0.1 0.1 0.2 0.2  BASOSABS 0.0  --   --  0.0 0.0 0.0 0.0   < > = values in this interval not displayed.    Recent Labs  Lab 08/12/23 1037 08/12/23 1044 08/12/23 1200 08/12/23 1412 08/13/23 0406 08/14/23 0541 08/14/23 0609 08/14/23 1003 08/15/23 0556 08/16/23 0320 08/17/23 0518 08/18/23 0349  NA 133*   < >  --   --  134* 134*  --   --  133* 134* 134* 137  K 4.9   < >  --   --  3.7 3.2*  --   --  2.9* 2.9* 3.5 3.6  CL 97*   < >  --   --  99 100  --   --  99 100 101 104  CO2 18*  --   --   --  22 23  --   --  24 25 26 25   ANIONGAP 18*  --   --   --  13 11  --   --  10 9 7 8   GLUCOSE 169*   < >  --   --  150* 112*  --   --   109* 105* 81 92  BUN 15   < >  --   --  14 16  --   --  12 11 13 13   CREATININE 1.50*   < >  --   --  1.18 1.08  --   --  0.87 0.84 0.83 0.86  AST 49*  --   --   --  266*  --   --   --  218* 200* 174* 131*  ALT 31  --   --   --  61*  --   --   --  57* 60* 55* 51*  ALKPHOS 31*  --   --   --  24*  --   --   --  19* 19* 18* 18*  BILITOT 0.5  --   --   --  1.0  --   --   --  0.8 0.8 0.7 0.8  ALBUMIN 4.2  --   --   --  3.2*  --   --   --  2.4* 2.3* 2.1* 2.0*  CRP  --   --   --   --   --   --   --  11.1* 7.2* 6.9* 9.9* 7.7*  PROCALCITON  --   --   --   --   --   --  0.28  --  0.18 0.12 0.10 <0.10  LATICACIDVEN  --   --  3.5* 3.8*  --   --   --   --   --   --   --   --   INR 1.3*  --   --   --   --   --   --   --   --   --   --   --   BNP  --   --   --   --   --   --   --  295.7* 298.3* 473.2* 369.2* 295.0*  MG  --   --   --   --   --  1.8  --   --  1.7 1.6* 1.9 1.8  PHOS  --   --   --   --   --   --   --   --  2.1* 1.9* 2.7 2.6  CALCIUM 9.1  --   --   --  8.6* 8.3*  --   --  7.8* 7.8* 7.7* 7.8*   < > = values in this interval not displayed.      Recent Labs  Lab 08/12/23 1037 08/12/23 1200 08/12/23 1412 08/13/23 0406 08/14/23 0541 08/14/23 0609 08/14/23 1003 08/15/23 0556 08/16/23 0320 08/17/23 0518 08/18/23 0349  CRP  --   --   --   --   --   --  11.1* 7.2* 6.9* 9.9* 7.7*  PROCALCITON  --   --   --   --   --  0.28  --  0.18 0.12 0.10 <0.10  LATICACIDVEN  --  3.5* 3.8*  --   --   --   --   --   --   --   --   INR 1.3*  --   --   --   --   --   --   --   --   --   --   BNP  --   --   --   --   --   --  295.7* 298.3* 473.2* 369.2* 295.0*  MG  --   --   --   --  1.8  --   --  1.7 1.6* 1.9 1.8  CALCIUM 9.1  --   --    < > 8.3*  --   --  7.8* 7.8* 7.7* 7.8*   < > = values in this interval not displayed.    --------------------------------------------------------------------------------------------------------------- Lab Results  Component Value Date   CHOL 132 12/08/2020   HDL 26  (L) 12/08/2020   LDLCALC 98 12/08/2020   TRIG 39 12/08/2020   CHOLHDL 5.1 12/08/2020    Lab Results  Component Value Date   HGBA1C 6.0 (H) 12/08/2020      Micro Results Recent Results (from the past 240 hours)  Culture, blood (routine x 2)     Status: None   Collection Time: 08/12/23 11:22 AM   Specimen: BLOOD LEFT HAND  Result  Value Ref Range Status   Specimen Description BLOOD LEFT HAND  Final   Special Requests   Final    BOTTLES DRAWN AEROBIC AND ANAEROBIC Blood Culture results may not be optimal due to an inadequate volume of blood received in culture bottles   Culture   Final    NO GROWTH 5 DAYS Performed at [facility], [address], [place], [place] [address]    Report Status 08/17/2023 FINAL  Final  Resp panel by RT-PCR (RSV, Flu A&B, Covid) Anterior Nasal Swab     Status: None   Collection Time: 08/12/23 11:26 AM   Specimen: Anterior Nasal Swab  Result Value Ref Range Status   SARS Coronavirus 2 by RT PCR NEGATIVE NEGATIVE Final   Influenza A by PCR NEGATIVE NEGATIVE Final   Influenza B by PCR NEGATIVE NEGATIVE Final    Comment: (NOTE) The Xpert Xpress SARS-CoV-2/FLU/RSV plus assay is intended as an aid in the diagnosis of influenza from Nasopharyngeal swab specimens and should not be used as a sole basis for treatment. Nasal washings and aspirates are unacceptable for Xpert Xpress SARS-CoV-2/FLU/RSV testing.  Fact Sheet for Patients: BloggerCourse.com  Fact Sheet for Healthcare Providers: SeriousBroker.it  This test is not yet approved or cleared by the [place] FDA and has been authorized for detection and/or diagnosis of SARS-CoV-2 by FDA under an Emergency Use Authorization (EUA). This EUA will remain in effect (meaning this test can be used) for the duration of the COVID-19 declaration under Section 564(b)(1) of the Act, 21 U.S.C. section 360bbb-3(b)(1), unless the authorization is  terminated or revoked.     Resp Syncytial Virus by PCR NEGATIVE NEGATIVE Final    Comment: (NOTE) Fact Sheet for Patients: BloggerCourse.com  Fact Sheet for Healthcare Providers: SeriousBroker.it  This test is not yet approved or cleared by the [place] FDA and has been authorized for detection and/or diagnosis of SARS-CoV-2 by FDA under an Emergency Use Authorization (EUA). This EUA will remain in effect (meaning this test can be used) for the duration of the COVID-19 declaration under Section 564(b)(1) of the Act, 21 U.S.C. section 360bbb-3(b)(1), unless the authorization is terminated or revoked.  Performed at [facility], [address] [place], [place] [address]   Culture, blood (routine x 2)     Status: Abnormal   Collection Time: 08/12/23 11:55 AM   Specimen: BLOOD LEFT ARM  Result Value Ref Range Status   Specimen Description BLOOD LEFT ARM  Final   Special Requests   Final    BOTTLES DRAWN AEROBIC AND ANAEROBIC Blood Culture adequate volume   Culture  Setup Time   Final    GRAM POSITIVE COCCI ANAEROBIC BOTTLE ONLY CRITICAL RESULT CALLED TO, READ BACK BY AND VERIFIED WITH: PHARMD Francie Massing on [phone] @0755  by SM    Culture (A)  Final    STAPHYLOCOCCUS EPIDERMIDIS THE SIGNIFICANCE OF ISOLATING THIS ORGANISM FROM A SINGLE SET OF BLOOD CULTURES WHEN MULTIPLE SETS ARE DRAWN IS UNCERTAIN. PLEASE NOTIFY THE MICROBIOLOGY DEPARTMENT WITHIN ONE WEEK IF SPECIATION AND SENSITIVITIES ARE REQUIRED. Performed at [facility], [address] [place], [place] [address]    Report Status 08/15/2023 FINAL  Final  Blood Culture ID Panel (Reflexed)     Status: Abnormal   Collection Time: 08/12/23 11:55 AM  Result Value Ref Range Status   Enterococcus faecalis NOT DETECTED NOT DETECTED Final   Enterococcus Faecium NOT DETECTED NOT DETECTED Final   Listeria monocytogenes NOT DETECTED  NOT DETECTED Final   Staphylococcus  species DETECTED (A) NOT DETECTED Final    Comment: CRITICAL RESULT CALLED TO, READ BACK BY AND VERIFIED WITH: PHARMD Bailey A on [phone] @0755  by SM    Staphylococcus aureus (BCID) NOT DETECTED NOT DETECTED Final   Staphylococcus epidermidis DETECTED (A) NOT DETECTED Final    Comment: Methicillin (oxacillin) resistant coagulase negative staphylococcus. Possible blood culture contaminant (unless isolated from more than one blood culture draw or clinical case suggests pathogenicity). No antibiotic treatment is indicated for blood  culture contaminants. CRITICAL RESULT CALLED TO, READ BACK BY AND VERIFIED WITH: PHARMD Bailey A on [phone] @[phone]  by SM    Staphylococcus lugdunensis NOT DETECTED NOT DETECTED Final   Streptococcus species NOT DETECTED NOT DETECTED Final   Streptococcus agalactiae NOT DETECTED NOT DETECTED Final   Streptococcus pneumoniae NOT DETECTED NOT DETECTED Final   Streptococcus pyogenes NOT DETECTED NOT DETECTED Final   A.calcoaceticus-baumannii NOT DETECTED NOT DETECTED Final   Bacteroides fragilis NOT DETECTED NOT DETECTED Final   Enterobacterales NOT DETECTED NOT DETECTED Final   Enterobacter cloacae complex NOT DETECTED NOT DETECTED Final   Escherichia coli NOT DETECTED NOT DETECTED Final   Klebsiella aerogenes NOT DETECTED NOT DETECTED Final   Klebsiella oxytoca NOT DETECTED NOT DETECTED Final   Klebsiella pneumoniae NOT DETECTED NOT DETECTED Final   Proteus species NOT DETECTED NOT DETECTED Final   Salmonella species NOT DETECTED NOT DETECTED Final   Serratia marcescens NOT DETECTED NOT DETECTED Final   Haemophilus influenzae NOT DETECTED NOT DETECTED Final   Neisseria meningitidis NOT DETECTED NOT DETECTED Final   Pseudomonas aeruginosa NOT DETECTED NOT DETECTED Final   Stenotrophomonas maltophilia NOT DETECTED NOT DETECTED Final   Candida albicans NOT DETECTED NOT DETECTED Final   Candida auris NOT DETECTED NOT DETECTED Final   Candida glabrata NOT DETECTED NOT  DETECTED Final   Candida krusei NOT DETECTED NOT DETECTED Final   Candida parapsilosis NOT DETECTED NOT DETECTED Final   Candida tropicalis NOT DETECTED NOT DETECTED Final   Cryptococcus neoformans/gattii NOT DETECTED NOT DETECTED Final   Methicillin resistance mecA/C DETECTED (A) NOT DETECTED Final    Comment: CRITICAL RESULT CALLED TO, READ BACK BY AND VERIFIED WITH: PHARMD Francie Massing on [phone] @0755  by SM Performed at [facility], [address] [place], [place] [address]   MRSA Next Gen by PCR, Nasal     Status: None   Collection Time: 08/14/23  8:48 AM   Specimen: Nasal Mucosa; Nasal Swab  Result Value Ref Range Status   MRSA by PCR Next Gen NOT DETECTED NOT DETECTED Final    Comment: (NOTE) The GeneXpert MRSA Assay (FDA approved for NASAL specimens only), is one component of a comprehensive MRSA colonization surveillance program. It is not intended to diagnose MRSA infection nor to guide or monitor treatment for MRSA infections. Test performance is not FDA approved in patients less than 28 years old. Performed at [facility], [address] [place], [place] [address]   CSF culture w Gram Stain     Status: None   Collection Time: 08/14/23  3:57 PM   Specimen: CSF; Cerebrospinal Fluid  Result Value Ref Range Status   Specimen Description CSF  Final   Special Requests NONE  Final   Gram Stain   Final    WBC PRESENT, PREDOMINANTLY MONONUCLEAR NO ORGANISMS SEEN CYTOSPIN SMEAR    Culture   Final    NO GROWTH 3 DAYS Performed at [facility]  Lab, [address] [place], [place] [address]    Report Status 08/17/2023 FINAL  Final  Culture, fungus without smear     Status: None (Preliminary result)   Collection Time: 08/14/23  3:57 PM   Specimen: CSF; Cerebrospinal Fluid  Result Value Ref Range Status   Specimen Description CSF  Final   Special Requests NONE  Final   Culture   Final    NO GROWTH 4 DAYS Performed at [facility], [address]  [place], [place] [address]    Report Status PENDING  Incomplete    Radiology Report MR BRAIN WO CONTRAST Result Date: 08/17/2023 CLINICAL DATA:  Neuro deficit, acute, stroke suspected EXAM: MRI HEAD WITHOUT CONTRAST TECHNIQUE: Multiplanar, multiecho pulse sequences of the brain and surrounding structures were obtained without intravenous contrast. COMPARISON:  None Available. FINDINGS: Motion limited study.  Within this limitation: Brain: No acute infarction, hemorrhage, hydrocephalus, extra-axial collection or mass lesion. Remote pontine infarct and remote lacunar infarcts in the thalami and basal ganglia. Patchy T2/FLAIR hyperintensities the white matter, compatible with chronic microvascular ischemic disease. Cerebral atrophy. Vascular: Major arterial flow voids are maintained skull base. Skull and upper cervical spine: Normal marrow signal. Sinuses/Orbits: Clear sinuses.  No acute orbital findings. Other: Small right mastoid effusion. IMPRESSION: 1. No evidence of acute intracranial abnormality on motion limited assessment. 2. Moderate chronic microvascular ischemic disease. 3.  Cerebral Atrophy (ICD10-G31.9). Electronically Signed   By: Stevenson Elbe M.D.   On: 08/17/2023 23:05     Signature  -   Kelleen Patee Luba Matzen M.D on 08/19/2023 at 10:05 AM   -  To page go to [email or web address]

## 2023-08-19 NOTE — TOC Progression Note (Signed)
 Transition of Care [facility]) - Progression Note    Patient Details  Name: Steven Meadows MRN: [id number] Date of Birth: 17-Jul-1931  [facility] CM/SW Contact  Jannice Mends, LCSW Phone Number: 08/19/2023, 9:41 AM  Clinical Narrative:    CSW following to facilitate [facility] Assessment for family's goal of discharging there Friday.    Expected Discharge Plan: Skilled Nursing Facility Barriers to Discharge: Continued Medical Work up  Expected Discharge Plan and Services In-house Referral: Clinical Social Work     Living arrangements for the past 2 months: Assisted Living Facility                                       Social Determinants of Health (SDOH) Interventions SDOH Screenings   Food Insecurity: No Food Insecurity (08/13/2023)  Housing: High Risk (08/13/2023)  Transportation Needs: No Transportation Needs (08/13/2023)  Utilities: Not At Risk (08/13/2023)  Depression (PHQ2-9): Medium Risk (06/07/2020)  Social Connections: Moderately Isolated (08/13/2023)  Tobacco Use: Low Risk  (08/12/2023)    Readmission Risk Interventions     No data to display

## 2023-08-19 NOTE — Progress Notes (Signed)
 Steven Meadows 5W10 [facility] Liaison Note   Received request for family interest in [facility]. Contacted family and discussed hospice philosophy and inpatient hospice care. Patient is approved for [facility] inpatient hospice care by Dr. Tessie Fila, hospice provider.   Family would like to wait until Friday to move Steven Meadows to [facility]. They are aware that he could have a hospital death and that a bed may or may not be available at [facility] on Friday.   Please do not hesitate to call with questions.   Madelene Schanz, BSN, RN, [facility] Liaison [phone]

## 2023-08-19 NOTE — Care Management Important Message (Signed)
 Important Message  Patient Details  Name: Steven Meadows MRN: [id number] Date of Birth: 09-11-1931   Important Message Given:  Yes - Medicare IM     Wynonia Hedges 08/19/2023, 10:57 AM

## 2023-08-19 NOTE — Progress Notes (Signed)
   Palliative Medicine Inpatient Follow Up Note HPI: 89 y.o. male with past medical history significant of hypertension, hyperlipidemia, atrial fibrillation, stroke, intracerebral hemorrhage, renal disease presented to hospital after found to be unresponsive at his assisted living facility.  He had been having progressive weakness recently and was found facedown on the floor.  Her workup in the ER was suggestive of possible acute and subacute stroke, ongoing seizures, suspicious for meningitis/encephalitis and he was admitted to the hospital, neurology was consulted.    Palliative care has been asked to support additional goals of care conversations.   Today's Discussion 08/19/2023  *Please note that this is a verbal dictation therefore any spelling or grammatical errors are due to the "Dragon Medical One" system interpretation.  Chart reviewed inclusive of vital signs, progress notes, laboratory results, and diagnostic images.   I met with patients son, daughter, and son in law. We reviewed that Steven Meadows has been minimally arousable. He had an abundance of visitors yesterday and lifted his L arm when they were singing but otherwise was relatively somnolent throughout the day.   Created space and opportunity for patients children to explore thoughts feelings and fears regarding Steven Meadows's current medical situation.  We discussed the plan for evaluation by [facility] tomorrow and possible placement on Friday if a bed is open.  Patients family share that Steven Meadows's wife, Steven Meadows had some reservations last night. I shared openly and honestly that as long as everyone in the family is respecting Steven Meadows's wishes then they are doing the right thing by him. They were agreeable to this.   Reviewed to alert team if Steven Meadows has any visible distress. Plan to start glycopyrrolate around the clock for rattling. Plan to stop mIVF Friday morning.  Questions and concerns addressed/Palliative Support Provided.    Objective Assessment: Vital Signs Vitals:   08/18/23 1600 08/19/23 0400  BP: (!) 141/92 (!) 153/102  Pulse: 94 (!) 104  Resp: 16 (!) 21  Temp:  98.9 F (37.2 C)  SpO2: 98% 97%    Intake/Output Summary (Last 24 hours) at 08/19/2023 8657 Last data filed at 08/18/2023 2100 Gross per 24 hour  Intake 2839.26 ml  Output 800 ml  Net 2039.26 ml   Last Weight  Most recent update: 08/13/2023  8:34 AM    Weight  90 kg (198 lb 6.6 oz)            Gen: Elderly Caucasian male chronically ill-appearing HEENT: Dry mucous membranes CV: Irregular rate and regular rhythm  PULM: On 4 L high flow nasal cannula, apnea episodes intermittently ABD: soft/nontender EXT: Generalized edema Neuro: Somnolent  SUMMARY OF RECOMMENDATIONS   DNAR/DNI   Comfort focused care   PRN medications per MAR  Glycopyrrolate 0.4mg  IVP Q4H ATC for secretions   Continue mIVF until Friday morning    Referral to BP on Friday though family aware patient may pass from now to then   Ongoing palliative care support  Billing based on MDM: High in the context of review and modification of parenteral controlled substances. ______________________________________________________________________________________ Steven Meadows Palliative Medicine Team Team Cell Phone: [phone] Please utilize secure chat with additional questions, if there is no response within 30 minutes please call the above phone number  Palliative Medicine Team providers are available by phone from 7am to 7pm daily and can be reached through the team cell phone.  Should this patient require assistance outside of these hours, please call the patient's attending physician.

## 2023-08-19 NOTE — Plan of Care (Signed)
  Problem: Elimination: Goal: Will not experience complications related to bowel motility Outcome: Progressing Goal: Will not experience complications related to urinary retention Outcome: Progressing   Problem: Pain Managment: Goal: General experience of comfort will improve and/or be controlled Outcome: Progressing

## 2023-08-20 DIAGNOSIS — G9340 Encephalopathy, unspecified: Secondary | ICD-10-CM | POA: Diagnosis not present

## 2023-08-20 DIAGNOSIS — Z8673 Personal history of transient ischemic attack (TIA), and cerebral infarction without residual deficits: Secondary | ICD-10-CM | POA: Diagnosis not present

## 2023-08-20 DIAGNOSIS — Z515 Encounter for palliative care: Secondary | ICD-10-CM | POA: Diagnosis not present

## 2023-08-20 MED ORDER — POLYVINYL ALCOHOL 1.4 % OP SOLN
1.0000 [drp] | Freq: Four times a day (QID) | OPHTHALMIC | Status: DC
  Administered 2023-08-20 (×2): 1 [drp] via OPHTHALMIC
  Filled 2023-08-20: qty 15

## 2023-08-20 NOTE — Progress Notes (Signed)
 Patient ID: Steven Meadows, male   DOB: 15-Oct-1931, 89 y.o.   MRN: [id number]    Progress Note from the Palliative Medicine Team at [facility]   Patient Name: Steven Meadows        Date: 08/20/2023 DOB: [birth year]  Age: 89 y.o. MRN#: [id number] Attending Physician: Epifanio Haste, MD Primary Care Physician: Pcp, No Admit Date: 08/12/2023   Reason for Consultation/Follow-up   Establishing Goals of Care   HPI/ Brief Hospital Review   89 y.o. male with past medical history significant of hypertension, hyperlipidemia, atrial fibrillation, stroke, intracerebral hemorrhage, renal disease presented to hospital after found to be unresponsive at his assisted living facility.  He had been having progressive weakness recently and was found facedown on the floor.  Her workup in the ER was suggestive of possible acute and subacute stroke, ongoing seizures, suspicious for meningitis/encephalitis and he was admitted to the hospital, neurology was consulted.    Palliative care has been asked to support additional goals of care conversations.   Subjective  Extensive chart review has been completed prior to meeting with patient/family  including labs, vital signs, imaging, progress/consult notes, orders, medications and available advance directive documents.    This NP assessed patient at the bedside as a follow up for palliative medicine needs and emotional support.  Son and daughter at bedside.  Patient is minimally responsive to gentle touch and verbal stimuli. Mr Lindfors appears comfortable.   Discussed with family next steps in transition of care.  [facility] in the next day or two.    Education offered regarding de-escalation of medical monitoring and interventions.  For now they wish to leave everything as is until other family members arrive.  Will remove SCDs for comfort.  Education offered on the natural trajectory and expectations at  EOL.   Questions and concerns addressed   Discussed with primary team and nursing staff   Time: 35   minutes  Detailed review of medical records ( labs, imaging, vital signs), medically appropriate exam ( MS, skin, cardiac,  resp)   discussed with treatment team, counseling and education to patient, family, staff, documenting clinical information, medication management, coordination of care    Thena Fireman NP  Palliative Medicine Team Team Phone # [phone] Pager [phone]

## 2023-08-20 NOTE — Plan of Care (Signed)

## 2023-08-20 NOTE — Progress Notes (Signed)
 Notified Dr. Jenelle Mis patient retaining in bladder and no urine output. No further orders at this time

## 2023-08-20 NOTE — Progress Notes (Signed)
 PROGRESS NOTE                                                                                                                                                                                                             Patient Demographics:    Steven Meadows, is a 89 y.o. male, DOB - Jan 03, 1932, ZOX:[id number]  Outpatient Primary MD for the patient is Pcp, No    LOS - 8  Admit date - 08/12/2023    No chief complaint on file.      Brief Narrative (HPI from H&P)     89 y.o. male with past medical history significant of hypertension, hyperlipidemia, atrial fibrillation, stroke, intracerebral hemorrhage, renal disease presented to hospital after found to be unresponsive at his assisted living facility.  He had been having progressive weakness recently and was found facedown on the floor.  Her workup in the ER was suggestive of possible acute and subacute stroke, ongoing seizures, suspicious for meningitis/encephalitis and he was admitted to the hospital, neurology was consulted.  Overall poor life quality, palliative medicine has been consulted and he was transition to full comfort measures   Subjective:   Currently nonverbal, discussed with son and daughter at bedside, all patient had some brief moment of lucidity where he was able to communicate with them, but otherwise has been sleeping   Assessment  & Plan :   Acute metabolic encephalopathy  Sepsis(ruled IN) present on admission due to aspiration pneumonia, right lung base Seizures Transaminitis Aspiration pneumonia History of CVA    History of atrial fibrillation.  Hypertension  Hypokalemia.   Hyperlipidemia  Pleural effusions - Patient was found to have recurrent focal right hemispheric seizures, medication has been adjusted by neurology, he is currently on IV Vimpat, as well likely sustained aspiration event during the seizures, overall poor life quality, significant  deconditioning and multiple comorbidities, palliative medicine assistance greatly appreciated, patient currently with full comfort measures, to keep current interventions including IV fluids, and IV seizure medications, with [facility] if he remains medically stable by then. - He is with apneic episodes, upper respiratory gurgling, his glycopyrrolate has been switched to scheduled - Patient with evidence of urinary retention on bladder scan, so Foley catheter was inserted. - I have with family, we can have him on a scheduled pain  medication, but for now they would like for [place] to keep it on as-needed basis as they would like to enjoy these moments of lucidity where they can still talk to him, I have asked him to let us know of any concerns for any discomfort so we can change to scheduled regimen      Condition -terminal  Family Communication  : Discussed with both son and daughter at bedside Code Status : DNR/comfort  Consults  : Neurology, palliative care   Procedures  :     Right arm PICC line placement on 08/16/2023.    MRI brain.   subtle diffusion signal abnormality in the right hippocampus with areas of restricted diffusion noted within the body and tail of the hippocampus. Additional subtle diffusion signal abnormality and restricted diffusion involving the right dorsal thalamus. Findings are concerning for acute to early subacute infarcts.   CTA head and neck.   CTA neck: 1. The common carotid and internal carotid arteries are patent within the neck without hemodynamically significant stenosis. Atherosclerotic plaque about the carotid bifurcations and within the proximal internal carotid arteries, bilaterally. 2. The left vertebral artery is non-dominant and developmentally diminutive, but patent throughout the neck. 3. The dominant right vertebral artery is patent within the neck. Non-stenotic atherosclerotic plaque at the origin of this vessel. 4.  Aortic Atherosclerosis (ICD10-I70.0).   CTA head: 1. No proximal intracranial large vessel occlusion identified. 2. Intracranial atherosclerotic disease with multifocal stenoses, most notably as follows. 3. Severe stenosis within a left PCA branch at the P2/P3 junction, new from prior exams. 4. Sites of up to moderate stenosis within the intracranial right vertebral artery. 5. Up to moderate stenosis of the supraclinoid right internal carotid artery. 6. 7-8 mm aneurysm at the vertebrobasilar junction on the right, unchanged in size from the CTA of 04/30/2020  EEG.  Positive for seizures.    Echocardiogram.  1. Left ventricular ejection fraction, by estimation, is 60 to 65%. The left ventricle has normal function. The left ventricle has no regional wall motion abnormalities. Left ventricular diastolic parameters are indeterminate.  2. Right ventricular systolic function is moderately reduced. The right ventricular size is moderately enlarged. There is normal pulmonary artery systolic pressure. The estimated right ventricular systolic pressure is 33.5 mmHg.  3. Left atrial size was severely dilated.  4. Right atrial size was severely dilated.  5. The mitral valve was not well visualized. Trivial mitral valve regurgitation. Moderate mitral annular calcification.  6. The aortic valve is tricuspid. Aortic valve regurgitation is not visualized. Aortic valve sclerosis/calcification is present, without any evidence of aortic stenosis.  7. Aortic dilatation noted. There is mild dilatation of the ascending aorta.  8. The inferior vena cava is dilated in size with <50% respiratory variability, suggesting right atrial pressure of 15 mmHg  LP -CSF not consistent with infection.      Disposition Plan  :    Status is: Inpatient  DVT Prophylaxis  :  SCDs/Hep gtt  Lab Results  Component Value Date   PLT 167 08/18/2023    Diet :  Diet Order             Diet clear liquid Room service appropriate? Yes; Fluid  consistency: Thin  Diet effective now                    Inpatient Medications  Scheduled Meds:  glycopyrrolate  0.4 mg Intravenous Q4H   pantoprazole (PROTONIX) IV  40 mg Intravenous Q24H  polyvinyl alcohol  1 drop Both Eyes QID   Continuous Infusions:  lacosamide (VIMPAT) IV 200 mg (08/20/23 0949)   PRN Meds:.acetaminophen **OR** acetaminophen, antiseptic oral rinse, LORazepam, morphine injection, ondansetron **OR** ondansetron (ZOFRAN) IV, polyethylene glycol    Objective:   Vitals:   08/19/23 0800 08/19/23 1400 08/19/23 1600 08/19/23 1900  BP: (!) 151/106 (!) 146/102 (!) 141/97 (!) 138/91  Pulse: 96 (!) 112 (!) 114 (!) 121  Resp: 19 (!) 21 14 (!) 23  Temp: 98 F (36.7 C) 98.2 F (36.8 C) 98.2 F (36.8 C) 99.1 F (37.3 C)  TempSrc: Axillary Axillary Oral Axillary  SpO2: 97% 98% 97% 95%  Weight:      Height:        Wt Readings from Last 3 Encounters:  08/13/23 90 kg  01/19/22 90.7 kg  06/10/21 98.9 kg    No intake or output data in the 24 hours ending 08/20/23 1106    Physical Exam  Ill-appearing, somnolent Good air entry bilaterally, with brief episodes of hypoventilation Some suprapubic fullness Extremities with anasarca     Data Review:    Recent Labs  Lab 08/14/23 1003 08/15/23 0556 08/16/23 0320 08/17/23 0518 08/18/23 0349  WBC 12.0* 9.9 9.0 8.4 9.3  HGB 12.8* 11.8* 12.1* 12.2* 12.3*  HCT 37.4* 35.7* 36.6* 36.6* 37.1*  PLT 158 131* 143* 165 167  MCV 85.8 85.6 87.1 86.9 87.5  MCH 29.4 28.3 28.8 29.0 29.0  MCHC 34.2 33.1 33.1 33.3 33.2  RDW 13.9 13.7 13.6 13.7 13.6  LYMPHSABS  --  1.1 1.2 1.3 1.1  MONOABS  --  1.9* 1.5* 1.4* 1.6*  EOSABS  --  0.1 0.1 0.2 0.2  BASOSABS  --  0.0 0.0 0.0 0.0    Recent Labs  Lab 08/14/23 0541 08/14/23 0609 08/14/23 1003 08/15/23 0556 08/16/23 0320 08/17/23 0518 08/18/23 0349  NA 134*  --   --  133* 134* 134* 137  K 3.2*  --   --  2.9* 2.9* 3.5 3.6  CL 100  --   --  99 100 101 104  CO2 23   --   --  24 25 26 25   ANIONGAP 11  --   --  10 9 7 8   GLUCOSE 112*  --   --  109* 105* 81 92  BUN 16  --   --  12 11 13 13   CREATININE 1.08  --   --  0.87 0.84 0.83 0.86  AST  --   --   --  218* 200* 174* 131*  ALT  --   --   --  57* 60* 55* 51*  ALKPHOS  --   --   --  19* 19* 18* 18*  BILITOT  --   --   --  0.8 0.8 0.7 0.8  ALBUMIN  --   --   --  2.4* 2.3* 2.1* 2.0*  CRP  --   --  11.1* 7.2* 6.9* 9.9* 7.7*  PROCALCITON  --  0.28  --  0.18 0.12 0.10 <0.10  BNP  --   --  295.7* 298.3* 473.2* 369.2* 295.0*  MG 1.8  --   --  1.7 1.6* 1.9 1.8  PHOS  --   --   --  2.1* 1.9* 2.7 2.6  CALCIUM 8.3*  --   --  7.8* 7.8* 7.7* 7.8*      Recent Labs  Lab 08/14/23 0541 08/14/23 0609 08/14/23 1003 08/15/23 0556 08/16/23 0320 08/17/23  0518 08/18/23 0349  CRP  --   --  11.1* 7.2* 6.9* 9.9* 7.7*  PROCALCITON  --  0.28  --  0.18 0.12 0.10 <0.10  BNP  --   --  295.7* 298.3* 473.2* 369.2* 295.0*  MG 1.8  --   --  1.7 1.6* 1.9 1.8  CALCIUM 8.3*  --   --  7.8* 7.8* 7.7* 7.8*    --------------------------------------------------------------------------------------------------------------- Lab Results  Component Value Date   CHOL 132 12/08/2020   HDL 26 (L) 12/08/2020   LDLCALC 98 12/08/2020   TRIG 39 12/08/2020   CHOLHDL 5.1 12/08/2020    Lab Results  Component Value Date   HGBA1C 6.0 (H) 12/08/2020      Micro Results Recent Results (from the past 240 hours)  Culture, blood (routine x 2)     Status: None   Collection Time: 08/12/23 11:22 AM   Specimen: BLOOD LEFT HAND  Result Value Ref Range Status   Specimen Description BLOOD LEFT HAND  Final   Special Requests   Final    BOTTLES DRAWN AEROBIC AND ANAEROBIC Blood Culture results may not be optimal due to an inadequate volume of blood received in culture bottles   Culture   Final    NO GROWTH 5 DAYS Performed at [facility], [address] [place], [place] [address]    Report Status 08/17/2023 FINAL  Final  Resp panel  by RT-PCR (RSV, Flu A&B, Covid) Anterior Nasal Swab     Status: None   Collection Time: 08/12/23 11:26 AM   Specimen: Anterior Nasal Swab  Result Value Ref Range Status   SARS Coronavirus 2 by RT PCR NEGATIVE NEGATIVE Final   Influenza A by PCR NEGATIVE NEGATIVE Final   Influenza B by PCR NEGATIVE NEGATIVE Final    Comment: (NOTE) The Xpert Xpress SARS-CoV-2/FLU/RSV plus assay is intended as an aid in the diagnosis of influenza from Nasopharyngeal swab specimens and should not be used as a sole basis for treatment. Nasal washings and aspirates are unacceptable for Xpert Xpress SARS-CoV-2/FLU/RSV testing.  Fact Sheet for Patients: BloggerCourse.com  Fact Sheet for Healthcare Providers: SeriousBroker.it  This test is not yet approved or cleared by the [place] FDA and has been authorized for detection and/or diagnosis of SARS-CoV-2 by FDA under an Emergency Use Authorization (EUA). This EUA will remain in effect (meaning this test can be used) for the duration of the COVID-19 declaration under Section 564(b)(1) of the Act, 21 U.S.C. section 360bbb-3(b)(1), unless the authorization is terminated or revoked.     Resp Syncytial Virus by PCR NEGATIVE NEGATIVE Final    Comment: (NOTE) Fact Sheet for Patients: BloggerCourse.com  Fact Sheet for Healthcare Providers: SeriousBroker.it  This test is not yet approved or cleared by the [place] FDA and has been authorized for detection and/or diagnosis of SARS-CoV-2 by FDA under an Emergency Use Authorization (EUA). This EUA will remain in effect (meaning this test can be used) for the duration of the COVID-19 declaration under Section 564(b)(1) of the Act, 21 U.S.C. section 360bbb-3(b)(1), unless the authorization is terminated or revoked.  Performed at [facility], [address], [place], [place] [address]   Culture,  blood (routine x 2)     Status: Abnormal   Collection Time: 08/12/23 11:55 AM   Specimen: BLOOD LEFT ARM  Result Value Ref Range Status   Specimen Description BLOOD LEFT ARM  Final   Special Requests   Final    BOTTLES  DRAWN AEROBIC AND ANAEROBIC Blood Culture adequate volume   Culture  Setup Time   Final    GRAM POSITIVE COCCI ANAEROBIC BOTTLE ONLY CRITICAL RESULT CALLED TO, READ BACK BY AND VERIFIED WITH: PHARMD Francie Massing on [phone] @0755  by SM    Culture (A)  Final    STAPHYLOCOCCUS EPIDERMIDIS THE SIGNIFICANCE OF ISOLATING THIS ORGANISM FROM A SINGLE SET OF BLOOD CULTURES WHEN MULTIPLE SETS ARE DRAWN IS UNCERTAIN. PLEASE NOTIFY THE MICROBIOLOGY DEPARTMENT WITHIN ONE WEEK IF SPECIATION AND SENSITIVITIES ARE REQUIRED. Performed at [facility], [address], [place], [place] [address]    Report Status 08/15/2023 FINAL  Final  Blood Culture ID Panel (Reflexed)     Status: Abnormal   Collection Time: 08/12/23 11:55 AM  Result Value Ref Range Status   Enterococcus faecalis NOT DETECTED NOT DETECTED Final   Enterococcus Faecium NOT DETECTED NOT DETECTED Final   Listeria monocytogenes NOT DETECTED NOT DETECTED Final   Staphylococcus species DETECTED (A) NOT DETECTED Final    Comment: CRITICAL RESULT CALLED TO, READ BACK BY AND VERIFIED WITH: PHARMD Bailey A on [phone] @0755  by SM    Staphylococcus aureus (BCID) NOT DETECTED NOT DETECTED Final   Staphylococcus epidermidis DETECTED (A) NOT DETECTED Final    Comment: Methicillin (oxacillin) resistant coagulase negative staphylococcus. Possible blood culture contaminant (unless isolated from more than one blood culture draw or clinical case suggests pathogenicity). No antibiotic treatment is indicated for blood  culture contaminants. CRITICAL RESULT CALLED TO, READ BACK BY AND VERIFIED WITH: PHARMD Bailey A on [phone] @0755  by SM    Staphylococcus lugdunensis NOT DETECTED NOT DETECTED Final   Streptococcus species NOT DETECTED NOT  DETECTED Final   Streptococcus agalactiae NOT DETECTED NOT DETECTED Final   Streptococcus pneumoniae NOT DETECTED NOT DETECTED Final   Streptococcus pyogenes NOT DETECTED NOT DETECTED Final   A.calcoaceticus-baumannii NOT DETECTED NOT DETECTED Final   Bacteroides fragilis NOT DETECTED NOT DETECTED Final   Enterobacterales NOT DETECTED NOT DETECTED Final   Enterobacter cloacae complex NOT DETECTED NOT DETECTED Final   Escherichia coli NOT DETECTED NOT DETECTED Final   Klebsiella aerogenes NOT DETECTED NOT DETECTED Final   Klebsiella oxytoca NOT DETECTED NOT DETECTED Final   Klebsiella pneumoniae NOT DETECTED NOT DETECTED Final   Proteus species NOT DETECTED NOT DETECTED Final   Salmonella species NOT DETECTED NOT DETECTED Final   Serratia marcescens NOT DETECTED NOT DETECTED Final   Haemophilus influenzae NOT DETECTED NOT DETECTED Final   Neisseria meningitidis NOT DETECTED NOT DETECTED Final   Pseudomonas aeruginosa NOT DETECTED NOT DETECTED Final   Stenotrophomonas maltophilia NOT DETECTED NOT DETECTED Final   Candida albicans NOT DETECTED NOT DETECTED Final   Candida auris NOT DETECTED NOT DETECTED Final   Candida glabrata NOT DETECTED NOT DETECTED Final   Candida krusei NOT DETECTED NOT DETECTED Final   Candida parapsilosis NOT DETECTED NOT DETECTED Final   Candida tropicalis NOT DETECTED NOT DETECTED Final   Cryptococcus neoformans/gattii NOT DETECTED NOT DETECTED Final   Methicillin resistance mecA/C DETECTED (A) NOT DETECTED Final    Comment: CRITICAL RESULT CALLED TO, READ BACK BY AND VERIFIED WITH: PHARMD Francie Massing on [phone] @0755  by SM Performed at [facility] Lab, [address], [place], [place] [address]   MRSA Next Gen by PCR, Nasal     Status: None   Collection Time: 08/14/23  8:48 AM   Specimen: Nasal Mucosa; Nasal Swab  Result Value Ref Range Status   MRSA by PCR  Next Gen NOT DETECTED NOT DETECTED Final    Comment: (NOTE) The GeneXpert MRSA Assay (FDA approved  for NASAL specimens only), is one component of a comprehensive MRSA colonization surveillance program. It is not intended to diagnose MRSA infection nor to guide or monitor treatment for MRSA infections. Test performance is not FDA approved in patients less than 64 years old. Performed at [facility], [address] [place], [place] [address]   CSF culture w Gram Stain     Status: None   Collection Time: 08/14/23  3:57 PM   Specimen: CSF; Cerebrospinal Fluid  Result Value Ref Range Status   Specimen Description CSF  Final   Special Requests NONE  Final   Gram Stain   Final    WBC PRESENT, PREDOMINANTLY MONONUCLEAR NO ORGANISMS SEEN CYTOSPIN SMEAR    Culture   Final    NO GROWTH 3 DAYS Performed at [facility], [address], [place], [place] [address]    Report Status 08/17/2023 FINAL  Final  Culture, fungus without smear     Status: None (Preliminary result)   Collection Time: 08/14/23  3:57 PM   Specimen: CSF; Cerebrospinal Fluid  Result Value Ref Range Status   Specimen Description CSF  Final   Special Requests NONE  Final   Culture   Final    NO GROWTH 5 DAYS Performed at [facility], [address], [place], [place] [address]    Report Status PENDING  Incomplete    Radiology Report No results found.    Signature  -   Seena Dadds M.D on 08/20/2023 at 11:06 AM   -  To page go to [email or web address]

## 2023-08-21 DIAGNOSIS — G9340 Encephalopathy, unspecified: Secondary | ICD-10-CM | POA: Diagnosis not present

## 2023-08-21 MED ORDER — GLYCOPYRROLATE 0.2 MG/ML IJ SOLN: 0.4000 mg | INTRAMUSCULAR | Status: AC

## 2023-08-21 MED ORDER — MORPHINE SULFATE (PF) 4 MG/ML IV SOLN
4.0000 mg | Freq: Once | INTRAVENOUS | Status: AC | PRN
  Administered 2023-08-21: 4 mg via INTRAVENOUS
  Filled 2023-08-21: qty 1

## 2023-08-21 MED ORDER — LORAZEPAM 2 MG/ML IJ SOLN: 2.0000 mg | INTRAMUSCULAR | Status: AC | PRN

## 2023-08-21 MED ORDER — MORPHINE SULFATE (PF) 2 MG/ML IV SOLN: 2.0000 mg | INTRAVENOUS | Status: AC | PRN

## 2023-08-21 MED ORDER — ACETAMINOPHEN 650 MG RE SUPP: 650.0000 mg | Freq: Four times a day (QID) | RECTAL | Status: AC | PRN

## 2023-08-21 MED ORDER — LACOSAMIDE 200 MG/20ML IV SOLN: 200.0000 mg | Freq: Two times a day (BID) | INTRAVENOUS | Status: AC

## 2023-08-21 NOTE — Plan of Care (Signed)

## 2023-08-21 NOTE — Discharge Summary (Signed)
 Physician Discharge Summary  Steven Meadows QMV:[id number] DOB: 1931/12/21 DOA: 08/12/2023  PCP: Pcp, No  Admit date: 08/12/2023 Discharge date: 08/21/2023  Admitted From: (Home) Disposition:  [facility])  Recommendations for Outpatient Follow-up:  Management per residential hospice   CODE STATUS: ( DNR, Comfort Care)  Brief/Interim Summary:  89 y.o. male with past medical history significant of hypertension, hyperlipidemia, atrial fibrillation, stroke, intracerebral hemorrhage, renal disease presented to hospital after found to be unresponsive at his assisted living facility.  He had been having progressive weakness recently and was found facedown on the floor.  Her workup in the ER was suggestive of possible acute and subacute stroke, ongoing seizures, suspicious for meningitis/encephalitis and he was admitted to the hospital, neurology was consulted.  Overall poor life quality, palliative medicine has been consulted and he was transition to full comfort measures   Discharge diagnosis  Acute metabolic encephalopathy multifactorial, due to infection and postictal status Sepsis(ruled IN) present on admission due to aspiration pneumonia, right lung base Seizures Transaminitis Aspiration pneumonia History of CVA    History of atrial fibrillation.  Hypertension  Hypokalemia.   Hyperlipidemia  Pleural effusions Urinary incontinance - Patient was found to have recurrent focal right hemispheric seizures, medication has been adjusted by neurology, he is currently on IV Vimpat , as well likely sustained aspiration event during the seizures, overall poor life quality, significant deconditioning and multiple comorbidities, palliative medicine assistance greatly appreciated, patient currently with full comfort measures, to keep current interventions including IV fluids, and IV seizure medications, with plan for discharge to [facility] on Friday, and on as needed morphine ,  Ativan , scheduled IV Vimpat  via PICC line, as discussed with residential hospice, recommendation to leave the PICC line and at time of discharge which has been done, Foley catheter was inserted for urinary retention, discussed with son prior to discharge to residential hospice today, will give 1 dose of morphine  prior to transport for anticipated discomfort during transport. Aaron Aas    Discharge Diagnoses:  Principal Problem:   Acute encephalopathy Active Problems:   History of CVA (cerebrovascular accident)   Chronic atrial fibrillation (HCC)   HLD (hyperlipidemia)   Essential hypertension    Discharge Instructions  Discharge Instructions     Diet - low sodium heart healthy   Complete by: As directed    Discharge instructions   Complete by: As directed    Management per [facility] management   Increase activity slowly   Complete by: As directed    No wound care   Complete by: As directed       Allergies as of 08/21/2023       Reactions   Nabumetone Other (See Comments)   Gi upset   Ace Inhibitors Cough   Flonase [fluticasone Propionate] Other (See Comments)   Causes nosebleeds        Medication List     STOP taking these medications    acetaminophen  325 MG tablet Commonly known as: TYLENOL  Replaced by: acetaminophen  650 MG suppository   apixaban  5 MG Tabs tablet Commonly known as: ELIQUIS    cyanocobalamin  1000 MCG tablet Commonly known as: VITAMIN B12   diltiazem  240 MG 24 hr capsule Commonly known as: CARDIZEM  CD   ezetimibe  10 MG tablet Commonly known as: ZETIA    hydrALAZINE  25 MG tablet Commonly known as: APRESOLINE    hydrochlorothiazide  12.5 MG tablet Commonly known as: HYDRODIURIL    pantoprazole  40 MG tablet Commonly known as: PROTONIX    rosuvastatin  5 MG tablet Commonly known  as: CRESTOR    sertraline 50 MG tablet Commonly known as: ZOLOFT   valsartan 80 MG tablet Commonly known as: DIOVAN   Vitamin D3 50 MCG (2000 UT) Tabs        TAKE these medications    acetaminophen  650 MG suppository Commonly known as: TYLENOL  Place 1 suppository (650 mg total) rectally every 6 (six) hours as needed for mild pain (pain score 1-3) (or Fever >/= 101). Replaces: acetaminophen  325 MG tablet   glycopyrrolate  0.2 MG/ML injection Commonly known as: ROBINUL  Inject 2 mLs (0.4 mg total) into the vein every 4 (four) hours.   lacosamide  200 mg in sodium chloride  0.9 % 25 mL Inject 200 mg into the vein every 12 (twelve) hours.   LORazepam  2 MG/ML injection Commonly known as: ATIVAN  Inject 1 mL (2 mg total) into the vein every 2 (two) hours as needed for seizure, anxiety or sedation.   morphine  (PF) 2 MG/ML injection Inject 1 mL (2 mg total) into the vein every 2 (two) hours as needed (Pain/Dyspnea).        Allergies  Allergen Reactions   Nabumetone Other (See Comments)    Gi upset   Ace Inhibitors Cough   Flonase [Fluticasone Propionate] Other (See Comments)    Causes nosebleeds    Consultations: Neurology Palliaitve   Procedures/Studies: MR BRAIN WO CONTRAST Result Date: 08/17/2023 CLINICAL DATA:  Neuro deficit, acute, stroke suspected EXAM: MRI HEAD WITHOUT CONTRAST TECHNIQUE: Multiplanar, multiecho pulse sequences of the brain and surrounding structures were obtained without intravenous contrast. COMPARISON:  None Available. FINDINGS: Motion limited study.  Within this limitation: Brain: No acute infarction, hemorrhage, hydrocephalus, extra-axial collection or mass lesion. Remote pontine infarct and remote lacunar infarcts in the thalami and basal ganglia. Patchy T2/FLAIR hyperintensities the white matter, compatible with chronic microvascular ischemic disease. Cerebral atrophy. Vascular: Major arterial flow voids are maintained skull base. Skull and upper cervical spine: Normal marrow signal. Sinuses/Orbits: Clear sinuses.  No acute orbital findings. Other: Small right mastoid effusion. IMPRESSION: 1. No evidence of  acute intracranial abnormality on motion limited assessment. 2. Moderate chronic microvascular ischemic disease. 3.  Cerebral Atrophy (ICD10-G31.9). Electronically Signed   By: Stevenson Elbe M.D.   On: 08/17/2023 23:05   DG Chest Port 1 View Result Date: 08/17/2023 CLINICAL DATA:  Shortness of breath. EXAM: PORTABLE CHEST 1 VIEW COMPARISON:  08/15/2023 FINDINGS: The cardio pericardial silhouette is enlarged. Interval progression of interstitial opacity in the right lung with bibasilar atelectasis or infiltrate in similar small bilateral pleural effusions. No acute bony abnormality. Right PICC line tip overlies the proximal SVC level. Telemetry leads overlie the chest. IMPRESSION: Interval progression of interstitial opacity in the right lung with persistent bibasilar atelectasis or infiltrate and similar small bilateral pleural effusions. Electronically Signed   By: Donnal Fusi M.D.   On: 08/17/2023 06:21   US  Abdomen Limited RUQ (LIVER/GB) Result Date: 08/16/2023 CLINICAL DATA:  Transaminitis EXAM: ULTRASOUND ABDOMEN LIMITED RIGHT UPPER QUADRANT COMPARISON:  None Available. FINDINGS: Gallbladder: No visible stones. Slight gallbladder wall thickening at 4 mm. Negative sonographic Murphy's. Common bile duct: Diameter: Normal caliber, 5 mm. Liver: No focal lesion identified. Within normal limits in parenchymal echogenicity. Portal vein is patent on color Doppler imaging with normal direction of blood flow towards the liver. Other: None. IMPRESSION: No evidence of cholelithiasis or acute cholecystitis. No acute findings. Electronically Signed   By: Janeece Mechanic M.D.   On: 08/16/2023 19:10   VAS US  UPPER EXTREMITY VENOUS DUPLEX Result Date: 08/16/2023  UPPER VENOUS STUDY  Patient Name:  Steven Meadows  Date of Exam:   08/15/2023 Medical Rec #: [id number]       Accession #:    [id number] Date of Birth: 1932-04-13        Patient Gender: M Patient Age:   5 years Exam Location:  [facility] Procedure:       VAS US  UPPER EXTREMITY VENOUS DUPLEX Referring Phys: Landon Pinion [facility] --------------------------------------------------------------------------------  Indications: Swelling. Patient found face-down and unresponsive at living facility with arms under him. CK elevated >3,000 Risk Factors: Atrial fibrillation. Anticoagulation: Eliquis  outside of hospital, Heparin  infusion in hospital. Limitations: Swelling, patient asleep and unable to keep arms extended and poor ultrasound/tissue interface. Comparison Study: No prior study Performing Technologist: Carleene Chase RVS  Examination Guidelines: A complete evaluation includes B-mode imaging, spectral Doppler, color Doppler, and power Doppler as needed of all accessible portions of each vessel. Bilateral testing is considered an integral part of a complete examination. Limited examinations for reoccurring indications may be performed as noted.  Right Findings: +----------+------------+---------+-----------+----------+-------+ RIGHT     CompressiblePhasicitySpontaneousPropertiesSummary +----------+------------+---------+-----------+----------+-------+ IJV           Full       Yes       Yes                      +----------+------------+---------+-----------+----------+-------+ Subclavian    Full       Yes       Yes                      +----------+------------+---------+-----------+----------+-------+ Axillary                 Yes       Yes                      +----------+------------+---------+-----------+----------+-------+ Brachial      Full                                          +----------+------------+---------+-----------+----------+-------+ Radial        Full                                          +----------+------------+---------+-----------+----------+-------+ Ulnar         Full                                          +----------+------------+---------+-----------+----------+-------+ Cephalic      Full                                           +----------+------------+---------+-----------+----------+-------+ Basilic       Full                                          +----------+------------+---------+-----------+----------+-------+  Left Findings: +----------+------------+---------+-----------+----------+---------------+ LEFT      CompressiblePhasicitySpontaneousProperties    Summary     +----------+------------+---------+-----------+----------+---------------+ IJV  Full       Yes       Yes                              +----------+------------+---------+-----------+----------+---------------+ Subclavian    Full       Yes       Yes                              +----------+------------+---------+-----------+----------+---------------+ Axillary      Full       Yes       Yes                              +----------+------------+---------+-----------+----------+---------------+ Brachial                 Yes       Yes                              +----------+------------+---------+-----------+----------+---------------+ Radial        Full                                                  +----------+------------+---------+-----------+----------+---------------+ Ulnar                                               patent by color +----------+------------+---------+-----------+----------+---------------+ Basilic       Full                                                  +----------+------------+---------+-----------+----------+---------------+  Summary:  Right: No evidence of deep vein thrombosis in the upper extremity. No evidence of superficial vein thrombosis in the upper extremity.  Left: No evidence of deep vein thrombosis in the upper extremity. No evidence of superficial vein thrombosis in the upper extremity.  *See table(s) above for measurements and observations.  Diagnosing physician: Irvin Mantel Electronically signed by Irvin Mantel on  08/16/2023 at 9:24:43 AM.    Final    DG CHEST PORT 1 VIEW Result Date: 08/15/2023 CLINICAL DATA:  222480 Status post PICC central line placement [id number] EXAM: PORTABLE CHEST 1 VIEW COMPARISON:  Chest x-ray 08/14/2023 FINDINGS: Interval placement of a right PICC with tip overlying the distal superior vena cava. The heart and mediastinal contours are unchanged. Atherosclerotic plaque. No focal consolidation. No pulmonary edema. No pleural effusion. No pneumothorax. No acute osseous abnormality. IMPRESSION: 1. Right PICC with tip overlying the distal superior vena cava. 2. No acute cardiopulmonary abnormality. 3.  Aortic Atherosclerosis (ICD10-I70.0). Electronically Signed   By: Morgane  Naveau M.D.   On: 08/15/2023 19:34   US  EKG SITE RITE Result Date: 08/15/2023 If Site Rite image not attached, placement could not be confirmed due to current cardiac rhythm.  ECHOCARDIOGRAM COMPLETE Result Date: 08/14/2023    ECHOCARDIOGRAM REPORT   Patient Name:   Steven Meadows Date of Exam: 08/14/2023 Medical Rec #:  [id number]      Height:       71.0 in Accession #:    [id number]     Weight:       198.4 lb Date of Birth:  04-Feb-1932       BSA:          2.101 m Patient Age:    89 years       BP:           138/86 mmHg Patient Gender: M              HR:           97 bpm. Exam Location:  Inpatient Procedure: 2D Echo, Cardiac Doppler and Color Doppler (Both Spectral and Color            Flow Doppler were utilized during procedure). Indications:    CHF-Acute Diastolic I50.31  History:        Patient has prior history of Echocardiogram examinations, most                 recent 12/08/2020.  Sonographer:    Hersey Lorenzo RDCS Referring Phys: Florie Husband Andrez Banker [facility] IMPRESSIONS  1. Left ventricular ejection fraction, by estimation, is 60 to 65%. The left ventricle has normal function. The left ventricle has no regional wall motion abnormalities. Left ventricular diastolic parameters are indeterminate.  2. Right ventricular systolic  function is moderately reduced. The right ventricular size is moderately enlarged. There is normal pulmonary artery systolic pressure. The estimated right ventricular systolic pressure is 33.5 mmHg.  3. Left atrial size was severely dilated.  4. Right atrial size was severely dilated.  5. The mitral valve was not well visualized. Trivial mitral valve regurgitation. Moderate mitral annular calcification.  6. The aortic valve is tricuspid. Aortic valve regurgitation is not visualized. Aortic valve sclerosis/calcification is present, without any evidence of aortic stenosis.  7. Aortic dilatation noted. There is mild dilatation of the ascending aorta.  8. The inferior vena cava is dilated in size with <50% respiratory variability, suggesting right atrial pressure of 15 mmHg. Conclusion(s)/Recommendation(s): Technically very limited study due to poor sound wave transmission. FINDINGS  Left Ventricle: Left ventricular ejection fraction, by estimation, is 60 to 65%. The left ventricle has normal function. The left ventricle has no regional wall motion abnormalities. The left ventricular internal cavity size was normal in size. There is  no left ventricular hypertrophy. Left ventricular diastolic parameters are indeterminate. Right Ventricle: The right ventricular size is moderately enlarged. No increase in right ventricular wall thickness. Right ventricular systolic function is moderately reduced. There is normal pulmonary artery systolic pressure. The tricuspid regurgitant velocity is 2.15 m/s, and with an assumed right atrial pressure of 15 mmHg, the estimated right ventricular systolic pressure is 33.5 mmHg. Left Atrium: Left atrial size was severely dilated. Right Atrium: Right atrial size was severely dilated. Pericardium: There is no evidence of pericardial effusion. Mitral Valve: The mitral valve was not well visualized. Moderate mitral annular calcification. Trivial mitral valve regurgitation. Tricuspid Valve: The  tricuspid valve is normal in structure. Tricuspid valve regurgitation is mild. Aortic Valve: The aortic valve is tricuspid. Aortic valve regurgitation is not visualized. Aortic valve sclerosis/calcification is present, without any evidence of aortic stenosis. Pulmonic Valve: The pulmonic valve was grossly normal. Pulmonic valve regurgitation is trivial. Aorta: Aortic dilatation noted. There is mild dilatation of the ascending aorta. Venous: The inferior vena cava is dilated in size with less than 50% respiratory variability, suggesting right  atrial pressure of 15 mmHg. IAS/Shunts: No atrial level shunt detected by color flow Doppler.  IVC IVC diam: 2.60 cm LEFT ATRIUM           Index        RIGHT ATRIUM           Index LA Vol (A4C): 99.9 ml 47.54 ml/m  RA Area:     22.50 cm                                    RA Volume:   67.20 ml  31.98 ml/m  AORTIC VALVE LVOT Vmax:   67.10 cm/s LVOT Vmean:  50.700 cm/s LVOT VTI:    0.116 m  AORTA Ao Root diam: 4.10 cm Ao Asc diam:  4.00 cm TRICUSPID VALVE TR Peak grad:   18.5 mmHg TR Vmax:        215.00 cm/s  SHUNTS Systemic VTI: 0.12 m Jules Oar MD Electronically signed by Jules Oar MD Signature Date/Time: 08/14/2023/3:43:56 PM    Final    DG Chest Port 1 View Result Date: 08/14/2023 CLINICAL DATA:  Shortness of breath EXAM: PORTABLE CHEST 1 VIEW COMPARISON:  Two days prior FINDINGS: Generous heart size and stable mediastinal contours. Mild hazy density at the bases favoring atelectasis. There is no edema, consolidation, effusion, or pneumothorax. IMPRESSION: Mild hazy density at the bases favoring atelectasis. Electronically Signed   By: Ronnette Coke M.D.   On: 08/14/2023 06:29   Overnight EEG with video Result Date: 08/14/2023 Arleene Lack, MD     08/15/2023  7:03 AM Patient Name: Steven Meadows MRN: [id number] Epilepsy Attending: Arleene Lack Referring Physician/Provider: Augustin Leber, MD Duration: 08/13/2023 1234 to 08/14/2023 1234  Patient history: a 89 y.o. male with a history of previous stroke, encephalitis at age 58, atrial fibrillation on Eliquis , recent decline who presented on 08/12/2023 with decreased responsiveness.  EEG to evaluate for seizure Level of alertness: awake AEDs during EEG study: LEV Technical aspects: This EEG study was done with scalp electrodes positioned according to the 10-20 International system of electrode placement. Electrical activity was reviewed with band pass filter of 1-70Hz , sensitivity of 7 uV/mm, display speed of 55mm/sec with a 60Hz  notched filter applied as appropriate. EEG data were recorded continuously and digitally stored.  Video monitoring was available and reviewed as appropriate. Description: EEG showed continuous generalized 3 to 6 Hz theta-delta slowing. Lateralized periodic discharges were noted in right hemisphere, maximal right posterior quadrant with fluctuating frequency of 0.5-1.5Hz  Hz. Five seizures without clinical signs were noted arising from right hemisphere on 08/13/2023 at 1249, 1358, 1423, 1442 and 1525. During seizure, EEG showed rhythmic 5-6hz  theta slowing in right hemisphere which then became higher amplitude and evolved into 2-3hz  delta slowing admixed with spikes. Duration of seizure was about 2 minutes each. Hyperventilation and photic stimulation were not performed.   ABNORMALITY - Seizures without clinical signs, right hemisphere - Lateralized periodic discharges, right hemisphere, maximal right posterior quadrant - Continuous slow, generalized IMPRESSION: This study showed five seizures without clinical signs were noted arising from right hemisphere on 08/13/2023 at 1249, 1358, 1423, 1442 and 1525 lasting about 2 minutes each.  Additionally there is evidence of epileptogenicity arising from right hemisphere, maximal right posterior quadrant. This EEG pattern is on the ictal-interictal continuum with high risk of seizure recurrence.  Lastly there is moderate diffuse  encephalopathy. Priyanka O Yadav  MR BRAIN WO CONTRAST Addendum Date: 08/13/2023 ADDENDUM REPORT: 08/13/2023 11:45 ADDENDUM: On further review of the images there is subtle diffusion signal abnormality in the right hippocampus with areas of restricted diffusion noted within the body and tail of the hippocampus. Additional subtle diffusion signal abnormality and restricted diffusion involving the right dorsal thalamus. Findings are concerning for acute to early subacute infarcts. Additional subtle diffusion signal abnormality in the right temporal lobe without definite restricted diffusion which may be artifactual versus additional region of infarct. Findings were discussed with Dr. Alecia Ames by Dr. Jenean Minus on 08/13/2023. Electronically Signed   By: Denny Flack M.D.   On: 08/13/2023 11:45   Result Date: 08/13/2023 CLINICAL DATA:  Mental status change. EXAM: MRI HEAD WITHOUT CONTRAST TECHNIQUE: Multiplanar, multiecho pulse sequences of the brain and surrounding structures were obtained without intravenous contrast. COMPARISON:  CT head earlier same day.  MRI head 12/07/2020. FINDINGS: Brain: No acute infarct. Scattered and confluent FLAIR signal abnormality in the periventricular and subcortical white matter suggestive of chronic microvascular ischemic changes. Multiple small remote infarcts in the bilateral corona radiata and periventricular white matter. Additional remote infarcts in the central pons. Additional remote lacunar infarcts in the bilateral basal ganglia and bilateral thalami. Chronic microhemorrhages in the thalami, left greater than right, and the central pons. Marked parenchymal volume loss with slight temporal lobe predominance. No mass lesion or midline shift. Normal appearance of midline structures. The basilar cisterns are patent. No extra-axial fluid collections. Ventricles: Prominence of the lateral ventricles suggestive of underlying parenchymal volume loss. Vascular: Skull base flow  voids are visualized. Skull and upper cervical spine: No focal abnormality. Sinuses/Orbits: Orbits are symmetric. Bilateral lens replacement. Mucosal thickening in the frontal sinuses. Other: Small right mastoid effusion. IMPRESSION: No acute intracranial abnormality. Extensive chronic microvascular ischemic changes and parenchymal volume loss. Multiple remote infarcts as above. Chronic microhemorrhages in the thalami and pons which may be related to hypertension. Electronically Signed: By: Denny Flack M.D. On: 08/12/2023 18:50   CT ANGIO HEAD NECK W WO CM Result Date: 08/13/2023 CLINICAL DATA:  Provided history: Stroke, follow-up. EXAM: CT ANGIOGRAPHY HEAD AND NECK WITH AND WITHOUT CONTRAST TECHNIQUE: Multidetector CT imaging of the head and neck was performed using the standard protocol during bolus administration of intravenous contrast. Multiplanar CT image reconstructions and MIPs were obtained to evaluate the vascular anatomy. Carotid stenosis measurements (when applicable) are obtained utilizing NASCET criteria, using the distal internal carotid diameter as the denominator. RADIATION DOSE REDUCTION: This exam was performed according to the departmental dose-optimization program which includes automated exposure control, adjustment of the mA and/or kV according to patient size and/or use of iterative reconstruction technique. CONTRAST:  75mL OMNIPAQUE  IOHEXOL  350 MG/ML SOLN COMPARISON:  Brain MRI 08/12/2023. CT angiogram head/neck 04/30/2020. MRA head 12/07/2020. Obstructive plaque FINDINGS: CT HEAD FINDINGS Motion degraded exam. Brain: Generalized cerebral atrophy. Chronic lacunar infarcts within the bilateral cerebral hemispheric white matter, within/about the bilateral deep gray nuclei and within the pons, some of which were better appreciated on the same-day brain MRI. Advanced patchy and confluent hypoattenuation elsewhere in the cerebral white matter, nonspecific but compatible with chronic small  vessel ischemic disease. Diffusion-weighted signal abnormality noted within the anteromedial right temporal lobe, right hippocampus and dorsomedial right thalamus on same-day brain MRI. There is no acute intracranial hemorrhage. No extra-axial fluid collection. No evidence of an intracranial mass. No midline shift. Vascular: No hyperdense vessel. Atherosclerotic calcifications. Skull: No calvarial fracture or aggressive osseous lesion. Sinuses/Orbits: No orbital mass or  acute orbital finding. Small mucous retention cysts within the bilateral frontal sinuses. Minimal mucosal thickening scattered within the ethmoid air cells. Mild mucosal thickening within the maxillary sinuses. Other: Trace fluid within right mastoid air cells. Review of the MIP images confirms the above findings CTA NECK FINDINGS Aortic arch: Common origin of the innominate and left common carotid arteries. The visualized thoracic aorta is normal in caliber. Aspect plaque within the visualized thoracic aorta and proximal major branch vessels of the neck. No hemodynamically significant innominate or proximal subclavian artery stenosis. Right carotid system: CCA and ICA patent within the neck. Atherosclerotic plaque about the carotid bifurcation and within the proximal ICA without hemodynamically significant stenosis (50% or greater). Tortuosity of the mid cervical ICA. Left carotid system: CCA and ICA patent within the neck. Aspect plaque about the carotid bifurcation S5 plaque both carotid bifurcation and within the proximal ICA without hemodynamically significant stenosis (50% or greater). Mild nonstenotic calcified plaque also present within the distal cervical ICA. Vertebral arteries: The left vertebral artery is non-dominant and developmentally diminutive, but patent throughout the neck. Calcified aspect plaque at the origin of this vessel. The dominant right vertebral artery is patent within the neck. Nonstenotic calcified plaque at the origin  of this vessel. Skeleton: Spondylosis at the cervical and visualized upper thoracic levels. C2-C3 vertebral ankylosis. Mild chronic T5 superior endplate compression deformity. Other neck: No neck mass or cervical lymphadenopathy. Upper chest: No consolidation within the imaged lung apices. Review of the MIP images confirms the above findings CTA HEAD FINDINGS Anterior circulation: The intracranial internal carotid arteries are patent. Calcified atherosclerotic plaque within both vessels. Up to moderate stenosis of the supraclinoid right ICA. No more than mild stenosis elsewhere within the intracranial internal carotid arteries. The M1 middle cerebral arteries are patent. No M2 proximal branch occlusion or high-grade proximal stenosis. The anterior cerebral arteries are patent. Somewhat hypoplastic right A1 segment. Posterior circulation: The intracranial vertebral arteries are patent. The non-dominant left vertebral artery terminates predominantly (or entirely) as the left PICA. Atherosclerotic plaque within the intracranial right vertebral artery with sites of up to moderate stenosis. 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 The basilar artery is patent. Atherosclerotic plaque within the proximal basilar artery with no more than mild stenosis. The posterior cerebral arteries are patent. Atherosclerotic irregularity of both vessels. Most notably, there is a severe stenosis within a left PCA branch at the P2/P3 junction, which is new from prior exams (series 16, image 26). Hypoplastic right P1 segment with sizable right posterior communicating artery. The left posterior cerebral artery is fetal in origin. Venous sinuses: Limited assessment for dural venous sinus thrombosis due to contrast timing. Anatomic variants: As described. Review of the MIP images confirms the above findings Non-contrast head CT impression #2  These results were called by telephone at the time of interpretation on 08/13/2023 at 9:30 am to provider [facility] , who verbally acknowledged these results. IMPRESSION: Non-contrast head CT: 1. Motion degraded exam. 2. No CT evidence of an acute intracranial abnormality. However, diffusion-weighted signal abnormality is noted within the anteromedial right temporal lobe, right hippocampus and dorsomedial right thalamus on the same-day brain MRI. 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 Acute infarct considered less likely but not excluded. 3. Background parenchymal atrophy, chronic small vessel ischemic disease and chronic lacunar infarcts, as described. 4. Mild paranasal sinus  disease. CTA neck: 1. The common carotid and internal carotid arteries are patent within the neck without hemodynamically significant stenosis. Atherosclerotic plaque about the carotid bifurcations and within the proximal internal carotid arteries, bilaterally. 2. The left vertebral artery is non-dominant and developmentally diminutive, but patent throughout the neck. 3. The dominant right vertebral artery is patent within the neck. Non-stenotic atherosclerotic plaque at the origin of this vessel. 4. Aortic Atherosclerosis (ICD10-I70.0). CTA head: 1. No proximal intracranial large vessel occlusion identified. 2. Intracranial atherosclerotic disease with multifocal stenoses, most notably as follows. 3. Severe stenosis within a left PCA branch at the P2/P3 junction, new from prior exams. 4. Sites of up to moderate stenosis within the intracranial right vertebral artery. 5. Up to moderate stenosis of the supraclinoid right internal carotid artery. 6. 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 Electronically Signed   By: Bascom Lily D.O.   On: 08/13/2023 10:11   DG  Shoulder Left Port Result Date: 08/12/2023 CLINICAL DATA:  Fall bruising EXAM: LEFT SHOULDER COMPARISON:  None Available. FINDINGS: No fracture or malalignment. Moderate AC joint degenerative change. Mild glenohumeral degenerative change. No fracture or malalignment. Possible remote Hill-Sachs deformity of the superolateral humeral head. IMPRESSION: Degenerative changes.  No definite acute osseous abnormality Electronically Signed   By: Esmeralda Hedge M.D.   On: 08/12/2023 20:05   EEG adult Result Date: 08/12/2023 Arleene Lack, MD     08/12/2023  3:21 PM Patient Name: Steven Meadows MRN: [id number] Epilepsy Attending: Arleene Lack Referring Physician/Provider: Johnetta Nab, MD Date: 08/12/2023 Duration: 24.22 mins Patient history: 89yo M with ams. EEG to evaluate for seizure Level of alertness: Awake/ lethargic AEDs during EEG study: None Technical aspects: This EEG study was done with scalp electrodes positioned according to the 10-20 International system of electrode placement. Electrical activity was reviewed with band pass filter of 1-70Hz , sensitivity of 7 uV/mm, display speed of 53mm/sec with a 60Hz  notched filter applied as appropriate. EEG data were recorded continuously and digitally stored.  Video monitoring was available and reviewed as appropriate. Description: EEG showed continuous generalized 3 to 6 Hz theta-delta slowing. Hyperventilation and photic stimulation were not performed.   ABNORMALITY - Continuous slow, generalized IMPRESSION: This study is suggestive of moderate diffuse encephalopathy. No seizures or epileptiform discharges were seen throughout the recording. Arleene Lack   DG Chest Portable 1 View Result Date: 08/12/2023 CLINICAL DATA:  Trauma. EXAM: PORTABLE CHEST 1 VIEW COMPARISON:  April 30, 2020. FINDINGS: Stable cardiomediastinal silhouette. Both lungs are clear. The visualized skeletal structures are unremarkable. IMPRESSION: No active disease. Electronically  Signed   By: Rosalene Colon M.D.   On: 08/12/2023 12:44   DG Pelvis Portable Result Date: 08/12/2023 CLINICAL DATA:  Trauma. EXAM: PORTABLE PELVIS 1-2 VIEWS COMPARISON:  None Available. FINDINGS: No acute fracture or dislocation. The bones are osteopenic. Mild bilateral hip arthritic changes. The soft tissues are unremarkable. IMPRESSION: 1. No acute fracture or dislocation. 2. Osteopenia. Electronically Signed   By: Angus Bark M.D.   On: 08/12/2023 12:42   CT Head Wo Contrast Result Date: 08/12/2023 CLINICAL DATA:  Ataxia, head trauma; Ataxia, cervical trauma EXAM: CT HEAD WITHOUT CONTRAST CT CERVICAL SPINE WITHOUT CONTRAST TECHNIQUE: Multidetector CT imaging of the head and cervical spine was performed following the standard protocol without intravenous contrast. Multiplanar CT image reconstructions of the cervical spine were also generated. RADIATION DOSE REDUCTION: This  exam was performed according to the departmental dose-optimization program which includes automated exposure control, adjustment of the mA and/or kV according to patient size and/or use of iterative reconstruction technique. COMPARISON:  MRI head from 12/07/2020, CT scan head from 12/07/2020 and CT angiography neck from 04/30/2020. FINDINGS: CT HEAD FINDINGS Brain: No evidence of acute infarction, hemorrhage, hydrocephalus, extra-axial collection or mass lesion/mass effect. There is bilateral periventricular hypodensity, which is non-specific but most likely seen in the settings of microvascular ischemic changes. Moderate in extent. Redemonstration of chronic infarct in the midline/right paramedian pons, similar to the prior study. Otherwise normal appearance of brain parenchyma. Cerebral volume loss with enlargement of the ventricles. Vascular: No hyperdense vessel or unexpected calcification. Intracranial arteriosclerosis. Skull: Normal. Negative for fracture or focal lesion. Sinuses/Orbits: No acute finding. Other: Visualized  mastoid air cells are unremarkable. No mastoid effusion. CT CERVICAL SPINE FINDINGS Alignment: There is grade 1 anterolisthesis of C5 over C6, most likely degenerative. This examination does not assess for ligamentous injury or stability. Skull base and vertebrae: No acute fracture. No primary bone lesion or focal pathologic process. Soft tissues and spinal canal: No prevertebral fluid or swelling. No visible canal hematoma. Disc levels: Moderate multilevel degenerative changes characterized by reduced intervertebral disc height (most pronounced at C6-7 level), along with moderate multilevel facet arthropathy and mild marginal osteophyte formation. Upper chest: Negative. Others: There is a 9 x 10 mm hypoattenuating nodule in the right thyroid  lobe, incompletely characterized on the current exam. However, nodule does not meet the size criteria for follow-up ultrasound evaluation. IMPRESSION: *No acute intracranial abnormality. *No acute osseous injury of the cervical spine. Electronically Signed   By: Beula Brunswick M.D.   On: 08/12/2023 11:12   CT Cervical Spine Wo Contrast Result Date: 08/12/2023 CLINICAL DATA:  Ataxia, head trauma; Ataxia, cervical trauma EXAM: CT HEAD WITHOUT CONTRAST CT CERVICAL SPINE WITHOUT CONTRAST TECHNIQUE: Multidetector CT imaging of the head and cervical spine was performed following the standard protocol without intravenous contrast. Multiplanar CT image reconstructions of the cervical spine were also generated. RADIATION DOSE REDUCTION: This exam was performed according to the departmental dose-optimization program which includes automated exposure control, adjustment of the mA and/or kV according to patient size and/or use of iterative reconstruction technique. COMPARISON:  MRI head from 12/07/2020, CT scan head from 12/07/2020 and CT angiography neck from 04/30/2020. FINDINGS: CT HEAD FINDINGS Brain: No evidence of acute infarction, hemorrhage, hydrocephalus, extra-axial collection  or mass lesion/mass effect. There is bilateral periventricular hypodensity, which is non-specific but most likely seen in the settings of microvascular ischemic changes. Moderate in extent. Redemonstration of chronic infarct in the midline/right paramedian pons, similar to the prior study. Otherwise normal appearance of brain parenchyma. Cerebral volume loss with enlargement of the ventricles. Vascular: No hyperdense vessel or unexpected calcification. Intracranial arteriosclerosis. Skull: Normal. Negative for fracture or focal lesion. Sinuses/Orbits: No acute finding. Other: Visualized mastoid air cells are unremarkable. No mastoid effusion. CT CERVICAL SPINE FINDINGS Alignment: There is grade 1 anterolisthesis of C5 over C6, most likely degenerative. This examination does not assess for ligamentous injury or stability. Skull base and vertebrae: No acute fracture. No primary bone lesion or focal pathologic process. Soft tissues and spinal canal: No prevertebral fluid or swelling. No visible canal hematoma. Disc levels: Moderate multilevel degenerative changes characterized by reduced intervertebral disc height (most pronounced at C6-7 level), along with moderate multilevel facet arthropathy and mild marginal osteophyte formation. Upper chest: Negative. Others: There is a 9 x 10 mm hypoattenuating  nodule in the right thyroid  lobe, incompletely characterized on the current exam. However, nodule does not meet the size criteria for follow-up ultrasound evaluation. IMPRESSION: *No acute intracranial abnormality. *No acute osseous injury of the cervical spine. Electronically Signed   By: Beula Brunswick M.D.   On: 08/12/2023 11:12      Subjective: No significant events overnight as discussed with son at bedside, and with staff  Discharge Exam: Vitals:   08/19/23 1900 08/20/23 2145  BP: (!) 138/91 (!) 141/11  Pulse: (!) 121 (!) 107  Resp: (!) 23 (!) 0  Temp: 99.1 F (37.3 C) 99 F (37.2 C)  SpO2: 95% 97%    Vitals:   08/19/23 1400 08/19/23 1600 08/19/23 1900 08/20/23 2145  BP: (!) 146/102 (!) 141/97 (!) 138/91 (!) 141/11  Pulse: (!) 112 (!) 114 (!) 121 (!) 107  Resp: (!) 21 14 (!) 23 (!) 0  Temp: 98.2 F (36.8 C) 98.2 F (36.8 C) 99.1 F (37.3 C) 99 F (37.2 C)  TempSrc: Axillary Oral Axillary Axillary  SpO2: 98% 97% 95% 97%  Weight:      Height:        Patient is somnolent, in no apparent distress, fair air entry bilaterally, generalized edema, Foley catheter present.right arm PICC linepresent   The results of significant diagnostics from this hospitalization (including imaging, microbiology, ancillary and laboratory) are listed below for reference.     Microbiology: Recent Results (from the past 240 hours)  Culture, blood (routine x 2)     Status: None   Collection Time: 08/12/23 11:22 AM   Specimen: BLOOD LEFT HAND  Result Value Ref Range Status   Specimen Description BLOOD LEFT HAND  Final   Special Requests   Final    BOTTLES DRAWN AEROBIC AND ANAEROBIC Blood Culture results may not be optimal due to an inadequate volume of blood received in culture bottles   Culture   Final    NO GROWTH 5 DAYS Performed at [facility], [address] [place], [place] [address]    Report Status 08/17/2023 FINAL  Final  Resp panel by RT-PCR (RSV, Flu A&B, Covid) Anterior Nasal Swab     Status: None   Collection Time: 08/12/23 11:26 AM   Specimen: Anterior Nasal Swab  Result Value Ref Range Status   SARS Coronavirus 2 by RT PCR NEGATIVE NEGATIVE Final   Influenza A by PCR NEGATIVE NEGATIVE Final   Influenza B by PCR NEGATIVE NEGATIVE Final    Comment: (NOTE) The Xpert Xpress SARS-CoV-2/FLU/RSV plus assay is intended as an aid in the diagnosis of influenza from Nasopharyngeal swab specimens and should not be used as a sole basis for treatment. Nasal washings and aspirates are unacceptable for Xpert Xpress SARS-CoV-2/FLU/RSV testing.  Fact Sheet for  Patients: BloggerCourse.com  Fact Sheet for Healthcare Providers: SeriousBroker.it  This test is not yet approved or cleared by the [place]  FDA and has been authorized for detection and/or diagnosis of SARS-CoV-2 by FDA under an Emergency Use Authorization (EUA). This EUA will remain in effect (meaning this test can be used) for the duration of the COVID-19 declaration under Section 564(b)(1) of the Act, 21 U.S.C. section 360bbb-3(b)(1), unless the authorization is terminated or revoked.     Resp Syncytial Virus by PCR NEGATIVE NEGATIVE Final    Comment: (NOTE) Fact Sheet for Patients: BloggerCourse.com  Fact Sheet for Healthcare Providers: SeriousBroker.it  This test is not yet approved or cleared by the [place]  FDA and has  been authorized for detection and/or diagnosis of SARS-CoV-2 by FDA under an Emergency Use Authorization (EUA). This EUA will remain in effect (meaning this test can be used) for the duration of the COVID-19 declaration under Section 564(b)(1) of the Act, 21 U.S.C. section 360bbb-3(b)(1), unless the authorization is terminated or revoked.  Performed at [facility], [address] [place], [place] [address]   Culture, blood (routine x 2)     Status: Abnormal   Collection Time: 08/12/23 11:55 AM   Specimen: BLOOD LEFT ARM  Result Value Ref Range Status   Specimen Description BLOOD LEFT ARM  Final   Special Requests   Final    BOTTLES DRAWN AEROBIC AND ANAEROBIC Blood Culture adequate volume   Culture  Setup Time   Final    GRAM POSITIVE COCCI ANAEROBIC BOTTLE ONLY CRITICAL RESULT CALLED TO, READ BACK BY AND VERIFIED WITH: PHARMD [facility] on [phone] @[phone]  by SM    Culture (A)  Final    STAPHYLOCOCCUS EPIDERMIDIS THE SIGNIFICANCE OF ISOLATING THIS ORGANISM FROM A SINGLE SET OF BLOOD CULTURES WHEN MULTIPLE SETS ARE DRAWN IS UNCERTAIN.  PLEASE NOTIFY THE MICROBIOLOGY DEPARTMENT WITHIN ONE WEEK IF SPECIATION AND SENSITIVITIES ARE REQUIRED. Performed at [facility], [address] [place], [place] [address]    Report Status 08/15/2023 FINAL  Final  Blood Culture ID Panel (Reflexed)     Status: Abnormal   Collection Time: 08/12/23 11:55 AM  Result Value Ref Range Status   Enterococcus faecalis NOT DETECTED NOT DETECTED Final   Enterococcus Faecium NOT DETECTED NOT DETECTED Final   Listeria monocytogenes NOT DETECTED NOT DETECTED Final   Staphylococcus species DETECTED (A) NOT DETECTED Final    Comment: CRITICAL RESULT CALLED TO, READ BACK BY AND VERIFIED WITH: PHARMD [facility] A on [phone] @[phone]  by SM    Staphylococcus aureus (BCID) NOT DETECTED NOT DETECTED Final   Staphylococcus epidermidis DETECTED (A) NOT DETECTED Final    Comment: Methicillin (oxacillin) resistant coagulase negative staphylococcus. Possible blood culture contaminant (unless isolated from more than one blood culture draw or clinical case suggests pathogenicity). No antibiotic treatment is indicated for blood  culture contaminants. CRITICAL RESULT CALLED TO, READ BACK BY AND VERIFIED WITH: PHARMD Bailey A on [phone] @[phone]  by SM    Staphylococcus lugdunensis NOT DETECTED NOT DETECTED Final   Streptococcus species NOT DETECTED NOT DETECTED Final   Streptococcus agalactiae NOT DETECTED NOT DETECTED Final   Streptococcus pneumoniae NOT DETECTED NOT DETECTED Final   Streptococcus pyogenes NOT DETECTED NOT DETECTED Final   A.calcoaceticus-baumannii NOT DETECTED NOT DETECTED Final   Bacteroides fragilis NOT DETECTED NOT DETECTED Final   Enterobacterales NOT DETECTED NOT DETECTED Final   Enterobacter cloacae complex NOT DETECTED NOT DETECTED Final   Escherichia coli NOT DETECTED NOT DETECTED Final   Klebsiella aerogenes NOT DETECTED NOT DETECTED Final   Klebsiella oxytoca NOT DETECTED NOT DETECTED Final   Klebsiella pneumoniae NOT DETECTED NOT DETECTED  Final   Proteus species NOT DETECTED NOT DETECTED Final   Salmonella species NOT DETECTED NOT DETECTED Final   Serratia marcescens NOT DETECTED NOT DETECTED Final   Haemophilus influenzae NOT DETECTED NOT DETECTED Final   Neisseria meningitidis NOT DETECTED NOT DETECTED Final   Pseudomonas aeruginosa NOT DETECTED NOT DETECTED Final   Stenotrophomonas maltophilia NOT DETECTED NOT DETECTED Final   Candida albicans NOT DETECTED NOT DETECTED Final   Candida auris NOT DETECTED NOT DETECTED Final   Candida glabrata NOT DETECTED NOT DETECTED Final  Candida krusei NOT DETECTED NOT DETECTED Final   Candida parapsilosis NOT DETECTED NOT DETECTED Final   Candida tropicalis NOT DETECTED NOT DETECTED Final   Cryptococcus neoformans/gattii NOT DETECTED NOT DETECTED Final   Methicillin resistance mecA/C DETECTED (A) NOT DETECTED Final    Comment: CRITICAL RESULT CALLED TO, READ BACK BY AND VERIFIED WITH: PHARMD [facility] on [phone] @[phone]  by SM Performed at [facility], [address] [place], [place] [address]   MRSA Next Gen by PCR, Nasal     Status: None   Collection Time: 08/14/23  8:48 AM   Specimen: Nasal Mucosa; Nasal Swab  Result Value Ref Range Status   MRSA by PCR Next Gen NOT DETECTED NOT DETECTED Final    Comment: (NOTE) The GeneXpert MRSA Assay (FDA approved for NASAL specimens only), is one component of a comprehensive MRSA colonization surveillance program. It is not intended to diagnose MRSA infection nor to guide or monitor treatment for MRSA infections. Test performance is not FDA approved in patients less than 19 years old. Performed at [facility], [address], [place], [place] [address]   CSF culture w Gram Stain     Status: None   Collection Time: 08/14/23  3:57 PM   Specimen: CSF; Cerebrospinal Fluid  Result Value Ref Range Status   Specimen Description CSF  Final   Special Requests NONE  Final   Gram Stain   Final    WBC PRESENT, PREDOMINANTLY  MONONUCLEAR NO ORGANISMS SEEN CYTOSPIN SMEAR    Culture   Final    NO GROWTH 3 DAYS Performed at [facility], [address], [place], [place] [address]    Report Status 08/17/2023 FINAL  Final  Culture, fungus without smear     Status: None (Preliminary result)   Collection Time: 08/14/23  3:57 PM   Specimen: CSF; Cerebrospinal Fluid  Result Value Ref Range Status   Specimen Description CSF  Final   Special Requests NONE  Final   Culture   Final    NO GROWTH 6 DAYS Performed at [facility], [address], [place], [place] [address]    Report Status PENDING  Incomplete     Labs: BNP (last 3 results) Recent Labs    08/16/23 0320 08/17/23 0518 08/18/23 0349  BNP 473.2* 369.2* 295.0*   Basic Metabolic Panel: Recent Labs  Lab 08/15/23 0556 08/16/23 0320 08/17/23 0518 08/18/23 0349  NA 133* 134* 134* 137  K 2.9* 2.9* 3.5 3.6  CL 99 100 101 104  CO2 24 25 26 25   GLUCOSE 109* 105* 81 92  BUN 12 11 13 13   CREATININE 0.87 0.84 0.83 0.86  CALCIUM  7.8* 7.8* 7.7* 7.8*  MG 1.7 1.6* 1.9 1.8  PHOS 2.1* 1.9* 2.7 2.6   Liver Function Tests: Recent Labs  Lab 08/15/23 0556 08/16/23 0320 08/17/23 0518 08/18/23 0349  AST 218* 200* 174* 131*  ALT 57* 60* 55* 51*  ALKPHOS 19* 19* 18* 18*  BILITOT 0.8 0.8 0.7 0.8  PROT 5.0* 5.1* 4.8* 4.7*  ALBUMIN 2.4* 2.3* 2.1* 2.0*   No results for input(s): "LIPASE", "AMYLASE" in the last 168 hours. No results for input(s): "AMMONIA" in the last 168 hours. CBC: Recent Labs  Lab 08/14/23 1003 08/15/23 0556 08/16/23 0320 08/17/23 0518 08/18/23 0349  WBC 12.0* 9.9 9.0 8.4 9.3  NEUTROABS  --  6.7 6.0 5.2 6.2  HGB 12.8* 11.8* 12.1* 12.2* 12.3*  HCT 37.4* 35.7* 36.6* 36.6*  37.1*  MCV 85.8 85.6 87.1 86.9 87.5  PLT 158 131* 143* 165 167   Cardiac Enzymes: No results for input(s): "CKTOTAL", "CKMB", "CKMBINDEX", "TROPONINI" in the last 168 hours. BNP: Invalid input(s): "POCBNP" CBG: No results for input(s):  "GLUCAP" in the last 168 hours. D-Dimer No results for input(s): "DDIMER" in the last 72 hours. Hgb A1c No results for input(s): "HGBA1C" in the last 72 hours. Lipid Profile No results for input(s): "CHOL", "HDL", "LDLCALC", "TRIG", "CHOLHDL", "LDLDIRECT" in the last 72 hours. Thyroid  function studies No results for input(s): "TSH", "T4TOTAL", "T3FREE", "THYROIDAB" in the last 72 hours.  Invalid input(s): "FREET3" Anemia work up No results for input(s): "VITAMINB12", "FOLATE", "FERRITIN", "TIBC", "IRON", "RETICCTPCT" in the last 72 hours. Urinalysis    Component Value Date/Time   COLORURINE YELLOW 08/12/2023 1300   APPEARANCEUR HAZY (A) 08/12/2023 1300   LABSPEC 1.016 08/12/2023 1300   PHURINE 6.0 08/12/2023 1300   GLUCOSEU NEGATIVE 08/12/2023 1300   HGBUR LARGE (A) 08/12/2023 1300   BILIRUBINUR NEGATIVE 08/12/2023 1300   KETONESUR NEGATIVE 08/12/2023 1300   PROTEINUR 100 (A) 08/12/2023 1300   UROBILINOGEN 0.2 01/05/2009 0535   NITRITE NEGATIVE 08/12/2023 1300   LEUKOCYTESUR NEGATIVE 08/12/2023 1300   Sepsis Labs Recent Labs  Lab 08/15/23 0556 08/16/23 0320 08/17/23 0518 08/18/23 0349  WBC 9.9 9.0 8.4 9.3   Microbiology Recent Results (from the past 240 hours)  Culture, blood (routine x 2)     Status: None   Collection Time: 08/12/23 11:22 AM   Specimen: BLOOD LEFT HAND  Result Value Ref Range Status   Specimen Description BLOOD LEFT HAND  Final   Special Requests   Final    BOTTLES DRAWN AEROBIC AND ANAEROBIC Blood Culture results may not be optimal due to an inadequate volume of blood received in culture bottles   Culture   Final    NO GROWTH 5 DAYS Performed at [facility], [address], [place], [place] [address]    Report Status 08/17/2023 FINAL  Final  Resp panel by RT-PCR (RSV, Flu A&B, Covid) Anterior Nasal Swab     Status: None   Collection Time: 08/12/23 11:26 AM   Specimen: Anterior Nasal Swab  Result Value Ref Range Status   SARS  Coronavirus 2 by RT PCR NEGATIVE NEGATIVE Final   Influenza A by PCR NEGATIVE NEGATIVE Final   Influenza B by PCR NEGATIVE NEGATIVE Final    Comment: (NOTE) The Xpert Xpress SARS-CoV-2/FLU/RSV plus assay is intended as an aid in the diagnosis of influenza from Nasopharyngeal swab specimens and should not be used as a sole basis for treatment. Nasal washings and aspirates are unacceptable for Xpert Xpress SARS-CoV-2/FLU/RSV testing.  Fact Sheet for Patients: BloggerCourse.com  Fact Sheet for Healthcare Providers: SeriousBroker.it  This test is not yet approved or cleared by the [place]  FDA and has been authorized for detection and/or diagnosis of SARS-CoV-2 by FDA under an Emergency Use Authorization (EUA). This EUA will remain in effect (meaning this test can be used) for the duration of the COVID-19 declaration under Section 564(b)(1) of the Act, 21 U.S.C. section 360bbb-3(b)(1), unless the authorization is terminated or revoked.     Resp Syncytial Virus by PCR NEGATIVE NEGATIVE Final    Comment: (NOTE) Fact Sheet for Patients: BloggerCourse.com  Fact Sheet for Healthcare Providers: SeriousBroker.it  This test is not yet approved or cleared by the [place]  FDA and has been authorized for detection and/or diagnosis of SARS-CoV-2 by FDA under an  Emergency Use Authorization (EUA). This EUA will remain in effect (meaning this test can be used) for the duration of the COVID-19 declaration under Section 564(b)(1) of the Act, 21 U.S.C. section 360bbb-3(b)(1), unless the authorization is terminated or revoked.  Performed at [facility], [address] [place], [place] [address]   Culture, blood (routine x 2)     Status: Abnormal   Collection Time: 08/12/23 11:55 AM   Specimen: BLOOD LEFT ARM  Result Value Ref Range Status   Specimen Description BLOOD LEFT ARM   Final   Special Requests   Final    BOTTLES DRAWN AEROBIC AND ANAEROBIC Blood Culture adequate volume   Culture  Setup Time   Final    GRAM POSITIVE COCCI ANAEROBIC BOTTLE ONLY CRITICAL RESULT CALLED TO, READ BACK BY AND VERIFIED WITH: PHARMD [facility] on [phone] @0755  by SM    Culture (A)  Final    STAPHYLOCOCCUS EPIDERMIDIS THE SIGNIFICANCE OF ISOLATING THIS ORGANISM FROM A SINGLE SET OF BLOOD CULTURES WHEN MULTIPLE SETS ARE DRAWN IS UNCERTAIN. PLEASE NOTIFY THE MICROBIOLOGY DEPARTMENT WITHIN ONE WEEK IF SPECIATION AND SENSITIVITIES ARE REQUIRED. Performed at [facility], [address], [place], [place] [address]    Report Status 08/15/2023 FINAL  Final  Blood Culture ID Panel (Reflexed)     Status: Abnormal   Collection Time: 08/12/23 11:55 AM  Result Value Ref Range Status   Enterococcus faecalis NOT DETECTED NOT DETECTED Final   Enterococcus Faecium NOT DETECTED NOT DETECTED Final   Listeria monocytogenes NOT DETECTED NOT DETECTED Final   Staphylococcus species DETECTED (A) NOT DETECTED Final    Comment: CRITICAL RESULT CALLED TO, READ BACK BY AND VERIFIED WITH: PHARMD [facility] on [phone] @0755  by SM    Staphylococcus aureus (BCID) NOT DETECTED NOT DETECTED Final   Staphylococcus epidermidis DETECTED (A) NOT DETECTED Final    Comment: Methicillin (oxacillin) resistant coagulase negative staphylococcus. Possible blood culture contaminant (unless isolated from more than one blood culture draw or clinical case suggests pathogenicity). No antibiotic treatment is indicated for blood  culture contaminants. CRITICAL RESULT CALLED TO, READ BACK BY AND VERIFIED WITH: PHARMD [facility] on [phone] @0755  by SM    Staphylococcus lugdunensis NOT DETECTED NOT DETECTED Final   Streptococcus species NOT DETECTED NOT DETECTED Final   Streptococcus agalactiae NOT DETECTED NOT DETECTED Final   Streptococcus pneumoniae NOT DETECTED NOT DETECTED Final   Streptococcus pyogenes NOT DETECTED NOT  DETECTED Final   A.calcoaceticus-baumannii NOT DETECTED NOT DETECTED Final   Bacteroides fragilis NOT DETECTED NOT DETECTED Final   Enterobacterales NOT DETECTED NOT DETECTED Final   Enterobacter cloacae complex NOT DETECTED NOT DETECTED Final   Escherichia coli NOT DETECTED NOT DETECTED Final   Klebsiella aerogenes NOT DETECTED NOT DETECTED Final   Klebsiella oxytoca NOT DETECTED NOT DETECTED Final   Klebsiella pneumoniae NOT DETECTED NOT DETECTED Final   Proteus species NOT DETECTED NOT DETECTED Final   Salmonella species NOT DETECTED NOT DETECTED Final   Serratia marcescens NOT DETECTED NOT DETECTED Final   Haemophilus influenzae NOT DETECTED NOT DETECTED Final   Neisseria meningitidis NOT DETECTED NOT DETECTED Final   Pseudomonas aeruginosa NOT DETECTED NOT DETECTED Final   Stenotrophomonas maltophilia NOT DETECTED NOT DETECTED Final   Candida albicans NOT DETECTED NOT DETECTED Final   Candida auris NOT DETECTED NOT DETECTED Final   Candida glabrata NOT DETECTED NOT DETECTED Final   Candida krusei NOT DETECTED NOT DETECTED Final   Candida parapsilosis NOT  DETECTED NOT DETECTED Final   Candida tropicalis NOT DETECTED NOT DETECTED Final   Cryptococcus neoformans/gattii NOT DETECTED NOT DETECTED Final   Methicillin resistance mecA/C DETECTED (A) NOT DETECTED Final    Comment: CRITICAL RESULT CALLED TO, READ BACK BY AND VERIFIED WITH: PHARMD [facility] on [phone] @0755  by SM Performed at [facility], [address] [place], [place] [address]   MRSA Next Gen by PCR, Nasal     Status: None   Collection Time: 08/14/23  8:48 AM   Specimen: Nasal Mucosa; Nasal Swab  Result Value Ref Range Status   MRSA by PCR Next Gen NOT DETECTED NOT DETECTED Final    Comment: (NOTE) The GeneXpert MRSA Assay (FDA approved for NASAL specimens only), is one component of a comprehensive MRSA colonization surveillance program. It is not intended to diagnose MRSA infection nor to guide or monitor  treatment for MRSA infections. Test performance is not FDA approved in patients less than 61 years old. Performed at [facility], [address] [place], [place] [address]   CSF culture w Gram Stain     Status: None   Collection Time: 08/14/23  3:57 PM   Specimen: CSF; Cerebrospinal Fluid  Result Value Ref Range Status   Specimen Description CSF  Final   Special Requests NONE  Final   Gram Stain   Final    WBC PRESENT, PREDOMINANTLY MONONUCLEAR NO ORGANISMS SEEN CYTOSPIN SMEAR    Culture   Final    NO GROWTH 3 DAYS Performed at [facility], [address], [place], [place] [address]    Report Status 08/17/2023 FINAL  Final  Culture, fungus without smear     Status: None (Preliminary result)   Collection Time: 08/14/23  3:57 PM   Specimen: CSF; Cerebrospinal Fluid  Result Value Ref Range Status   Specimen Description CSF  Final   Special Requests NONE  Final   Culture   Final    NO GROWTH 6 DAYS Performed at [facility], [address] [place], [place] [address]    Report Status PENDING  Incomplete     Time coordinating discharge:  30 minutes  SIGNED:   Seena Dadds, MD  [facility] 08/21/2023, 9:56 AM Pager   If 7PM-7AM, please contact night-coverage [email or web address]

## 2023-08-21 NOTE — Progress Notes (Signed)
 Report called to [facility] at this time

## 2023-08-21 NOTE — TOC Transition Note (Signed)
 Transition of Care [facility]) - Discharge Note   Patient Details  Name: Steven Meadows MRN: [id number] Date of Birth: 01-13-1932  [facility]) CM/SW Contact:  Inocente GORMAN Kindle, LCSW Phone Number: 08/21/2023, 11:00 AM   Clinical Narrative:    [facility] Anticipated DC date: 08/21/23 Family notified: Son Transport by: [place]   Per MD patient ready for DC to [facility]. RN to call report prior to discharge [phone]). RN, patient, patient's family, and facility notified of DC. Discharge Summary and sent to facility. DC packet on chart including signed DNR. Ambulance transport requested for patient.   CSW will sign off for now as social work intervention is no longer needed. Please consult us  again if new needs arise.     Final next level of care: Hospice Medical Facility Barriers to Discharge: Barriers Resolved   Patient Goals and CMS Choice Patient states their goals for this hospitalization and ongoing recovery are:: Comfort CMS Medicare.gov Compare Post Acute Care list provided to:: Patient Represenative (must comment) Choice offered to / list presented to : Spouse, Adult Children (patient and patients son) [place] ownership interest in [facility].provided to:: Adult Children    Discharge Placement                Patient to be transferred to facility by: PTAR Name of family member notified: Spouse Patient and family notified of of transfer: 08/20/23  Discharge Plan and Services Additional resources added to the After Visit Summary for   In-house Referral: Clinical Social Work                                   Social Drivers of Health (SDOH) Interventions SDOH Screenings   Food Insecurity: No Food Insecurity (08/13/2023)  Housing: High Risk (08/13/2023)  Transportation Needs: No Transportation Needs (08/13/2023)  Utilities: Not At Risk (08/13/2023)  Depression (PHQ2-9): Medium Risk (06/07/2020)   Social Connections: Moderately Isolated (08/13/2023)  Tobacco Use: Low Risk  (08/12/2023)     Readmission Risk Interventions     No data to display

## 2023-08-21 NOTE — Discharge Instructions (Signed)
 Management per [facility]

## 2023-08-21 NOTE — TOC Progression Note (Signed)
 Transition of Care [facility]) - Progression Note    Patient Details  Name: Steven Meadows MRN: [id number] Date of Birth: 1931/08/24  [facility] CM/SW Contact  Jannice Mends, LCSW Phone Number: 08/21/2023, 10:59 AM  Clinical Narrative:    [name] able to accept patient today and have completed consents with family. Will arrange PTAR.   Expected Discharge Plan: Skilled Nursing Facility Barriers to Discharge: Barriers Resolved  Expected Discharge Plan and Services In-house Referral: Clinical Social Work     Living arrangements for the past 2 months: Assisted Living Facility Expected Discharge Date: 08/21/23                                     Social Determinants of Health (SDOH) Interventions SDOH Screenings   Food Insecurity: No Food Insecurity (08/13/2023)  Housing: High Risk (08/13/2023)  Transportation Needs: No Transportation Needs (08/13/2023)  Utilities: Not At Risk (08/13/2023)  Depression (PHQ2-9): Medium Risk (06/07/2020)  Social Connections: Moderately Isolated (08/13/2023)  Tobacco Use: Low Risk  (08/12/2023)    Readmission Risk Interventions     No data to display

## 2023-08-21 NOTE — Progress Notes (Signed)
 AVS completed/printed; placed with patient chart.

## 2023-09-01 LAB — OLIGOCLONAL BANDS, CSF + SERM

## 2023-09-03 DEATH — deceased

## 2023-09-04 LAB — CULTURE, FUNGUS WITHOUT SMEAR
